# Patient Record
Sex: Female | Born: 1995
Health system: Southern US, Community
[De-identification: ages and names within clinical notes are randomized; demographics above are authoritative.]

## PROBLEM LIST (undated history)

## (undated) DIAGNOSIS — E1110 Type 2 diabetes mellitus with ketoacidosis without coma: Secondary | ICD-10-CM

## (undated) DIAGNOSIS — E109 Type 1 diabetes mellitus without complications: Secondary | ICD-10-CM

## (undated) DIAGNOSIS — N12 Tubulo-interstitial nephritis, not specified as acute or chronic: Secondary | ICD-10-CM

## (undated) HISTORY — DX: Type 2 diabetes mellitus with ketoacidosis without coma: E11.10

## (undated) HISTORY — DX: Type 1 diabetes mellitus without complications: E10.9

## (undated) HISTORY — PX: EYE MUSCLE SURGERY: SHX370

---
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
## 2015-06-12 ENCOUNTER — Encounter: Payer: Self-pay | Admitting: Emergency Medicine

## 2015-06-12 ENCOUNTER — Inpatient Hospital Stay
Admission: EM | Admit: 2015-06-12 | Discharge: 2015-06-16 | DRG: 638 | Disposition: A | Discharge status: Home / Self Care | Payer: 59 | Attending: Internal Medicine | Admitting: Internal Medicine

## 2015-06-12 ENCOUNTER — Emergency Department: Payer: 59

## 2015-06-12 DIAGNOSIS — B373 Candidiasis of vulva and vagina: Secondary | ICD-10-CM | POA: Diagnosis present

## 2015-06-12 DIAGNOSIS — E872 Acidosis: Secondary | ICD-10-CM | POA: Diagnosis present

## 2015-06-12 DIAGNOSIS — E876 Hypokalemia: Secondary | ICD-10-CM | POA: Diagnosis present

## 2015-06-12 DIAGNOSIS — B951 Streptococcus, group B, as the cause of diseases classified elsewhere: Secondary | ICD-10-CM | POA: Diagnosis present

## 2015-06-12 DIAGNOSIS — N390 Urinary tract infection, site not specified: Secondary | ICD-10-CM | POA: Diagnosis present

## 2015-06-12 DIAGNOSIS — R000 Tachycardia, unspecified: Secondary | ICD-10-CM | POA: Diagnosis present

## 2015-06-12 DIAGNOSIS — E860 Dehydration: Secondary | ICD-10-CM | POA: Diagnosis present

## 2015-06-12 DIAGNOSIS — R109 Unspecified abdominal pain: Secondary | ICD-10-CM

## 2015-06-12 DIAGNOSIS — R102 Pelvic and perineal pain: Secondary | ICD-10-CM | POA: Diagnosis present

## 2015-06-12 DIAGNOSIS — E1110 Type 2 diabetes mellitus with ketoacidosis without coma: Secondary | ICD-10-CM | POA: Diagnosis present

## 2015-06-12 DIAGNOSIS — Z794 Long term (current) use of insulin: Secondary | ICD-10-CM

## 2015-06-12 DIAGNOSIS — E1010 Type 1 diabetes mellitus with ketoacidosis without coma: Secondary | ICD-10-CM | POA: Diagnosis present

## 2015-06-12 LAB — URINALYSIS COMPLETE WITH MICROSCOPIC (ARMC ONLY)
BILIRUBIN URINE: NEGATIVE
Bacteria, UA: NONE SEEN
Hgb urine dipstick: NEGATIVE
Nitrite: NEGATIVE
Protein, ur: 100 mg/dL — AB
Specific Gravity, Urine: 1.021 (ref 1.005–1.030)
pH: 5 (ref 5.0–8.0)

## 2015-06-12 LAB — COMPREHENSIVE METABOLIC PANEL
ALBUMIN: 4.7 g/dL (ref 3.5–5.0)
ALK PHOS: 91 U/L (ref 38–126)
ALT: 12 U/L — AB (ref 14–54)
AST: 13 U/L — AB (ref 15–41)
Anion gap: 20 — ABNORMAL HIGH (ref 5–15)
BILIRUBIN TOTAL: 1 mg/dL (ref 0.3–1.2)
BUN: 11 mg/dL (ref 6–20)
CALCIUM: 9.7 mg/dL (ref 8.9–10.3)
CO2: 9 mmol/L — AB (ref 22–32)
CREATININE: 0.72 mg/dL (ref 0.44–1.00)
Chloride: 101 mmol/L (ref 101–111)
GFR calc Af Amer: >60 mL/min (ref >60)
GFR calc non Af Amer: >60 mL/min (ref >60)
GLUCOSE: 360 mg/dL — AB (ref 65–99)
Potassium: 4.6 mmol/L (ref 3.5–5.1)
SODIUM: 130 mmol/L — AB (ref 135–145)
Total Protein: 9.7 g/dL — ABNORMAL HIGH (ref 6.5–8.1)

## 2015-06-12 LAB — BASIC METABOLIC PANEL
Anion gap: 13 (ref 5–15)
Anion gap: 10 (ref 5–15)
BUN: 8 mg/dL (ref 6–20)
BUN: 7 mg/dL (ref 6–20)
CALCIUM: 8.2 mg/dL — AB (ref 8.9–10.3)
CALCIUM: 8.3 mg/dL — AB (ref 8.9–10.3)
CO2: 12 mmol/L — AB (ref 22–32)
CO2: 13 mmol/L — AB (ref 22–32)
CREATININE: 0.46 mg/dL (ref 0.44–1.00)
CREATININE: 0.47 mg/dL (ref 0.44–1.00)
Chloride: 110 mmol/L (ref 101–111)
Chloride: 109 mmol/L (ref 101–111)
GFR calc Af Amer: >60 mL/min (ref >60)
GFR calc non Af Amer: >60 mL/min (ref >60)
GFR calc non Af Amer: >60 mL/min (ref >60)
GLUCOSE: 134 mg/dL — AB (ref 65–99)
GLUCOSE: 294 mg/dL — AB (ref 65–99)
Potassium: 3.6 mmol/L (ref 3.5–5.1)
Potassium: 3.6 mmol/L (ref 3.5–5.1)
Sodium: 135 mmol/L (ref 135–145)
Sodium: 132 mmol/L — ABNORMAL LOW (ref 135–145)

## 2015-06-12 LAB — GLUCOSE, CAPILLARY
GLUCOSE-CAPILLARY: 130 mg/dL — AB (ref 65–99)
GLUCOSE-CAPILLARY: 157 mg/dL — AB (ref 65–99)
GLUCOSE-CAPILLARY: 264 mg/dL — AB (ref 65–99)
GLUCOSE-CAPILLARY: 277 mg/dL — AB (ref 65–99)
GLUCOSE-CAPILLARY: 265 mg/dL — AB (ref 65–99)
Glucose-Capillary: 389 mg/dL — ABNORMAL HIGH (ref 65–99)
Glucose-Capillary: 149 mg/dL — ABNORMAL HIGH (ref 65–99)
Glucose-Capillary: 146 mg/dL — ABNORMAL HIGH (ref 65–99)
Glucose-Capillary: 144 mg/dL — ABNORMAL HIGH (ref 65–99)
Glucose-Capillary: 131 mg/dL — ABNORMAL HIGH (ref 65–99)
Glucose-Capillary: 271 mg/dL — ABNORMAL HIGH (ref 65–99)

## 2015-06-12 LAB — CBC WITH DIFFERENTIAL/PLATELET
Basophils Absolute: 0 10*3/uL (ref 0–0.1)
EOS ABS: 0 10*3/uL (ref 0–0.7)
Eosinophils Relative: 0 %
HCT: 40.9 % (ref 35.0–47.0)
HEMOGLOBIN: 13.7 g/dL (ref 12.0–16.0)
LYMPHS ABS: 1.1 10*3/uL (ref 1.0–3.6)
Lymphocytes Relative: 7 %
MCH: 30.6 pg (ref 26.0–34.0)
MCHC: 33.4 g/dL (ref 32.0–36.0)
MCV: 91.4 fL (ref 80.0–100.0)
MONO ABS: 1.9 10*3/uL — AB (ref 0.2–0.9)
NEUTROS ABS: 12.2 10*3/uL — AB (ref 1.4–6.5)
Neutrophils Relative %: 80 %
PLATELETS: 265 10*3/uL (ref 150–440)
RBC: 4.47 MIL/uL (ref 3.80–5.20)
RDW: 12.9 % (ref 11.5–14.5)
WBC: 15.3 10*3/uL — ABNORMAL HIGH (ref 3.6–11.0)

## 2015-06-12 LAB — PREGNANCY, URINE: Preg Test, Ur: NEGATIVE

## 2015-06-12 LAB — RAPID INFLUENZA A&B ANTIGENS (ARMC ONLY)
INFLUENZA A (ARMC): NEGATIVE
INFLUENZA B (ARMC): NEGATIVE

## 2015-06-12 LAB — MRSA PCR SCREENING: MRSA by PCR: NEGATIVE

## 2015-06-12 MED ORDER — ENOXAPARIN SODIUM 40 MG/0.4ML ~~LOC~~ SOLN
40.0000 mg | SUBCUTANEOUS | Status: DC
  Administered 2015-06-12 – 2015-06-13 (×2): 40 mg via SUBCUTANEOUS
  Filled 2015-06-12 (×3): qty 0.4

## 2015-06-12 MED ORDER — PANTOPRAZOLE SODIUM 40 MG PO TBEC
40.0000 mg | DELAYED_RELEASE_TABLET | Freq: Every day | ORAL | Status: DC
  Administered 2015-06-12 – 2015-06-13 (×2): 40 mg via ORAL
  Filled 2015-06-12 (×2): qty 1

## 2015-06-12 MED ORDER — ONDANSETRON HCL 4 MG/2ML IJ SOLN
4.0000 mg | Freq: Once | INTRAMUSCULAR | Status: AC
  Administered 2015-06-12: 4 mg via INTRAVENOUS
  Filled 2015-06-12: qty 2

## 2015-06-12 MED ORDER — DEXTROSE-NACL 5-0.45 % IV SOLN
INTRAVENOUS | Status: DC
  Administered 2015-06-12 – 2015-06-13 (×3): via INTRAVENOUS

## 2015-06-12 MED ORDER — ONDANSETRON HCL 4 MG/2ML IJ SOLN: 4.0000 mg | Freq: Four times a day (QID) | INTRAMUSCULAR | Status: DC | PRN

## 2015-06-12 MED ORDER — SODIUM CHLORIDE 0.9% FLUSH
3.0000 mL | Freq: Two times a day (BID) | INTRAVENOUS | Status: DC
  Administered 2015-06-13 – 2015-06-16 (×6): 3 mL via INTRAVENOUS

## 2015-06-12 MED ORDER — MORPHINE SULFATE (PF) 2 MG/ML IV SOLN
2.0000 mg | INTRAVENOUS | Status: DC | PRN
  Administered 2015-06-12 – 2015-06-16 (×21): 2 mg via INTRAVENOUS
  Filled 2015-06-12 (×21): qty 1

## 2015-06-12 MED ORDER — ACETAMINOPHEN 650 MG RE SUPP: 650.0000 mg | Freq: Four times a day (QID) | RECTAL | Status: DC | PRN

## 2015-06-12 MED ORDER — SODIUM CHLORIDE 0.9 % IV BOLUS (SEPSIS)
1000.0000 mL | Freq: Once | INTRAVENOUS | Status: AC
  Administered 2015-06-12: 1000 mL via INTRAVENOUS

## 2015-06-12 MED ORDER — MORPHINE SULFATE (PF) 2 MG/ML IV SOLN
2.0000 mg | Freq: Once | INTRAVENOUS | Status: AC
  Administered 2015-06-12: 2 mg via INTRAVENOUS
  Filled 2015-06-12: qty 1

## 2015-06-12 MED ORDER — SODIUM CHLORIDE 0.9 % IV SOLN
INTRAVENOUS | Status: DC
  Administered 2015-06-12: 16:00:00 via INTRAVENOUS

## 2015-06-12 MED ORDER — OXYCODONE HCL 5 MG PO TABS
5.0000 mg | ORAL_TABLET | Freq: Once | ORAL | Status: AC
  Administered 2015-06-12: 5 mg via ORAL
  Filled 2015-06-12: qty 1

## 2015-06-12 MED ORDER — FLUCONAZOLE 50 MG PO TABS
150.0000 mg | ORAL_TABLET | Freq: Once | ORAL | Status: AC
  Administered 2015-06-12: 150 mg via ORAL
  Filled 2015-06-12: qty 1

## 2015-06-12 MED ORDER — DOCUSATE SODIUM 100 MG PO CAPS
100.0000 mg | ORAL_CAPSULE | Freq: Two times a day (BID) | ORAL | Status: DC
  Administered 2015-06-13 – 2015-06-16 (×6): 100 mg via ORAL
  Filled 2015-06-12 (×6): qty 1

## 2015-06-12 MED ORDER — INSULIN REGULAR HUMAN 100 UNIT/ML IJ SOLN
INTRAMUSCULAR | Status: DC
  Administered 2015-06-12: 6 [IU]/h via INTRAVENOUS
  Administered 2015-06-12: 0.9 [IU]/h via INTRAVENOUS
  Filled 2015-06-12: qty 2.5

## 2015-06-12 MED ORDER — IBUPROFEN 600 MG PO TABS
600.0000 mg | ORAL_TABLET | Freq: Once | ORAL | Status: AC
  Administered 2015-06-12: 600 mg via ORAL
  Filled 2015-06-12: qty 1

## 2015-06-12 MED ORDER — ONDANSETRON HCL 4 MG PO TABS: 4.0000 mg | ORAL_TABLET | Freq: Four times a day (QID) | ORAL | Status: DC | PRN

## 2015-06-12 MED ORDER — ACETAMINOPHEN 325 MG PO TABS
650.0000 mg | ORAL_TABLET | Freq: Four times a day (QID) | ORAL | Status: DC | PRN
  Administered 2015-06-16: 650 mg via ORAL
  Filled 2015-06-12: qty 2

## 2015-06-12 NOTE — ED Notes (Signed)
Pt presents to ED with hyperglycemia, nausea, and weakness. Pt reports she is type 1 diabetic. Pt reports she has a history of DKA.

## 2015-06-12 NOTE — H&P (Signed)
[facility] - [place] at [facility]   PATIENT NAME: Mandy Patel    MR#:  [id number]  DATE OF BIRTH:  1995/05/23  DATE OF ADMISSION:  06/12/2015  PRIMARY CARE PHYSICIAN: Romero BellingELLISON, SEAN, MD   REQUESTING/REFERRING PHYSICIAN: Dr. Shaune PollackLord  CHIEF COMPLAINT:  Nausea and feeling weak and tired  HISTORY OF PRESENT ILLNESS:  Mandy Patel  is a 20 y.o. female with a known history of nsulin requiring diabetes mellitus is presenting to the ED with a chief complaint of nausea without any vomiting and generalized weakness. Patient started having URI symptoms 3-4 days ago and is with decreased appetite. Patient is compliant with her medications including Lantus and NovoLog. Denies any abdominal pain or dizziness other complaints ABG with pH 7.19 and anion gap 20. Patient was given fluid boluses and insulin drip was initiated in the ED and hospitalist team is called to admit the patient  PAST MEDICAL HISTORY:   Past Medical History  Diagnosis Date  . Diabetes mellitus     type I, diagnosed age 20yo    PAST SURGICAL HISTOIRY:   Past Surgical History  Procedure Laterality Date  . Eye muscle surgery  approx 2010    bilat eye surgury, left exotropia worse;Dr Maple HudsonYoung, opthomology    SOCIAL HISTORY:   Social History  Substance Use Topics  . Smoking status: Never Smoker   . Smokeless tobacco: Not on file  . Alcohol Use: No    FAMILY HISTORY:   Family History  Problem Relation Age of Onset  . Hyperlipidemia Other   . Hypertension Other   . Diabetes Other   . Alcohol abuse Other   . Arthritis Other   . Hyperlipidemia Other   . Stroke Other   . Diabetes Other     DRUG ALLERGIES:  No Known Allergies  REVIEW OF SYSTEMS:  CONSTITUTIONAL: No fever, has generalized weakness  EYES: No blurred or double vision.  EARS, NOSE, AND THROAT: No tinnitus or ear pain.  RESPIRATORY: No cough, shortness of breath, wheezing or hemoptysis.  CARDIOVASCULAR: No chest pain,  orthopnea, edema.  GASTROINTESTINAL: Reporting nausea,  denies vomiting, diarrhea or abdominal pain.  GENITOURINARY: No dysuria, hematuria.  ENDOCRINE: No polyuria, nocturia,  HEMATOLOGY: No anemia, easy bruising or bleeding SKIN: No rash or lesion. MUSCULOSKELETAL: No joint pain or arthritis.   NEUROLOGIC: No tingling, numbness, weakness.  PSYCHIATRY: No anxiety or depression.   MEDICATIONS AT HOME:   Prior to Admission medications   Medication Sig Start Date End Date Taking? Authorizing Provider  insulin aspart (NOVOLOG FLEXPEN) 100 UNIT/ML injection Inject 9 Units into the skin 3 (three) times daily before meals. Patient taking differently: Inject 9 Units into the skin 3 (three) times daily before meals. Patient is dosed based on a sliding scale. 05/08/12  Yes Kermit Baloavid S Tysinger, PA-C  insulin glargine (LANTUS) 100 units/mL SOLN Inject 45 Units into the skin daily at 10 pm. Patient taking differently: Inject 30 Units into the skin at bedtime.  05/08/12  Yes Kermit Baloavid S Tysinger, PA-C      VITAL SIGNS:  Blood pressure 134/79, pulse 113, temperature 97.8 F (36.6 C), resp. rate 22, height 5\' 8"  (1.727 m), weight 73.936 kg (163 lb), last menstrual period 06/01/2015, SpO2 100 %.  PHYSICAL EXAMINATION:  GENERAL:  20 y.o.-year-old patient lying in the bed with no acute distress.  EYES: Pupils equal, round, reactive to light and accommodation. No scleral icterus. Extraocular muscles intact.  HEENT: Head atraumatic, normocephalic. Oropharynx and nasopharynx clear.  Dry mucous membranes  NECK:  Supple, no jugular venous distention. No thyroid enlargement, no tenderness.  LUNGS: Normal breath sounds bilaterally, no wheezing, rales,rhonchi or crepitation. No use of accessory muscles of respiration.  CARDIOVASCULAR: S1, S2 normal. No murmurs, rubs, or gallops.  ABDOMEN: Soft, nontender, nondistended. Bowel sounds present. No organomegaly or mass.  EXTREMITIES: No pedal edema, cyanosis, or clubbing.   NEUROLOGIC: Cranial nerves II through XII are intact. Muscle strength 5/5 in all extremities. Sensation intact. Gait not checked.  PSYCHIATRIC: The patient is alert and oriented x 3.  SKIN: No obvious rash, lesion, or ulcer.   LABORATORY PANEL:   CBC  Recent Labs Lab 06/12/15 1028  WBC 15.3*  HGB 13.7  HCT 40.9  PLT 265   ------------------------------------------------------------------------------------------------------------------  Chemistries   Recent Labs Lab 06/12/15 1028  NA 130*  K 4.6  CL 101  CO2 9*  GLUCOSE 360*  BUN 11  CREATININE 0.72  CALCIUM 9.7  AST 13*  ALT 12*  ALKPHOS 91  BILITOT 1.0   ------------------------------------------------------------------------------------------------------------------  Cardiac Enzymes No results for input(s): TROPONINI in the last 168 hours. ------------------------------------------------------------------------------------------------------------------  RADIOLOGY:  Dg Chest 2 View  06/12/2015  CLINICAL DATA:  Cough 0 1 week EXAM: CHEST  2 VIEW COMPARISON:  None. FINDINGS: The heart size and mediastinal contours are within normal limits. Both lungs are clear. The visualized skeletal structures are unremarkable. IMPRESSION: No active cardiopulmonary disease. Electronically Signed   By: Alcide Clever M.D.   On: 06/12/2015 14:18    EKG:   Orders placed or performed during the hospital encounter of 06/12/15  . EKG 12-Lead  . EKG 12-Lead    IMPRESSION AND PLAN:   Mandy Patel  is a 20 y.o. female with a known history of nsulin requiring diabetes mellitus is presenting to the ED with a chief complaint of nausea without any vomiting and generalized weakness. Patient started having URI symptoms 3-4 days ago and is with decreased appetite. Patient is compliant with her medications including Lantus and NovoLog. Denies any abdominal pain or dizziness other complaints ABG with pH 7.19 and anion gap 20.   #DKA with  history of insulin requiring diabetes mellitus pH is 7.19 and anion gap is a 20 patient is started on glucose stabilizes after fluid boluses Will provide aggressive hydration and continue glucose stabilizes her insulin drip Serial BMPs every 4 hours. Check a.m. labs including mag and phosphorus Will check hemoglobin A1c. Consult is placed to diabetic coordinator  #Pseudohyponatremia from hyperglycemia Serial BMPs and provide IV fluids aggressively. Patient is hemodynamically stable  #Vaginal discharge probably from [place] infection Diflucan 150 mg by mouth once will be given and GYN consult is placed for further examination. Patient reports single partner, no past medical history of STDs  #URI symptoms and check for flu   #Sinus tachycardia probably from dehydration from DKA Aggressive hydration with IV fluids    All the records are reviewed and case discussed with ED provider. Management plans discussed with the patient SHE is  in agreement.  CODE STATUS: fc/mom HCPOA  TOTAL CRITICAL CARE TIME TAKING CARE OF THIS PATIENT: 45 minutes.    Ramonita Lab M.D on 06/12/2015 at 2:36 PM  Between 7am to 6pm - Pager - [phone]  After 6pm go to [email or web address] - password [email or web address] [facility]  [facility]egino [facility]  Office  [phone]  CC: Primary care physician; Romero Belling, MD

## 2015-06-12 NOTE — ED Notes (Signed)
Pt states, "is that thing supposed to be hooked up?" This RN noticed that patient's IV giving her the insulin was not hooked up to patient at this time. RN hooked up patient's insulin drip, drip started for patient at 0.9 units/hour.

## 2015-06-12 NOTE — Progress Notes (Signed)
C/O vaginal pain.  States oxycodone did not help but that morphine received earlier relieved symptoms.  Spoke with E-Link MD.  Received new orders.  Will continue to monitor.

## 2015-06-12 NOTE — Progress Notes (Signed)
Paged Dr Amado CoeGouru regarding patients METB

## 2015-06-12 NOTE — Progress Notes (Signed)
Dr Amado CoeGouru notified of blood sugars being in the 130's. Insulin drip has now been on hold for 2 hours. Per MD continue to follow protocol.

## 2015-06-12 NOTE — ED Provider Notes (Signed)
[facility] Emergency Department Provider Note  ____________________________________________  Time seen:  I have reviewed the triage vital signs and the triage nursing note.  HISTORY  Chief Complaint Diabetic Ketoacidosis   Historian Patient and mother  HPI Mandy Patel is a 20 y.o. female with a history of type 1 diabetes who takes Lantus as well as a sliding scale NovoLog. She states that she started developing URI symptoms about a week ago and had decreased by mouth intake. Some nausea without vomiting. No diarrhea. No specific abdominal pain.  No fever. Symptoms are moderate and feel like prior episodes of DKA.    Past Medical History  Diagnosis Date  . Diabetes mellitus     type I, diagnosed age 48yo    Patient Active Problem List   Diagnosis Date Noted  . Type I (juvenile type) diabetes mellitus without mention of complication, uncontrolled 04/26/2011  . Preventative health care 12/01/2010  . Obesity 06/19/2010  . Goiter, unspecified 06/19/2010  . HYPERCHOLESTEROLEMIA 04/25/2008    Past Surgical History  Procedure Laterality Date  . Eye muscle surgery  approx 2010    bilat eye surgury, left exotropia worse;Dr Maple Hudson, opthomology    Current Outpatient Rx  Name  Route  Sig  Dispense  Refill  . insulin aspart (NOVOLOG FLEXPEN) 100 UNIT/ML injection   Subcutaneous   Inject 9 Units into the skin 3 (three) times daily before meals. Patient taking differently: Inject 9 Units into the skin 3 (three) times daily before meals. Patient is dosed based on a sliding scale.   30 mL   11   . insulin glargine (LANTUS) 100 units/mL SOLN   Subcutaneous   Inject 45 Units into the skin daily at 10 pm. Patient taking differently: Inject 30 Units into the skin at bedtime.    15 mL   11     Allergies Review of patient's allergies indicates no known allergies.  Family History  Problem Relation Age of Onset  . Hyperlipidemia Other   .  Hypertension Other   . Diabetes Other   . Alcohol abuse Other   . Arthritis Other   . Hyperlipidemia Other   . Stroke Other   . Diabetes Other     Social History Social History  Substance Use Topics  . Smoking status: Never Smoker   . Smokeless tobacco: None  . Alcohol Use: No    Review of Systems  Constitutional: Negative for fever. Eyes: Negative for visual changes. ENT: Negative for sore throat. Cardiovascular: Negative for chest pain. Respiratory: Negative for shortness of breath. Gastrointestinal: Negative for abdominal pain, vomiting and diarrhea. Genitourinary: Negative for dysuria. Has had some vaginal pain when she urinates. Musculoskeletal: Negative for back pain. Skin: Negative for rash. Neurological: Negative for headache. 10 point Review of Systems otherwise negative ____________________________________________   PHYSICAL EXAM:  VITAL SIGNS: ED Triage Vitals  Enc Vitals Group     BP 06/12/15 1017 134/85 mmHg     Pulse Rate 06/12/15 1017 131     Resp 06/12/15 1017 22     Temp 06/12/15 1017 97.8 F (36.6 C)     Temp src --      SpO2 06/12/15 1017 100 %     Weight 06/12/15 1017 163 lb (73.936 kg)     Height 06/12/15 1017 5\' 8"  (1.727 m)     Head Cir --      Peak Flow --      Pain Score 06/12/15 1016 9  Pain Loc --      Pain Edu? --      Excl. in GC? --      Constitutional: Alert and oriented. Well appearing and in no distress. HEENT   Head: Normocephalic and atraumatic.      Eyes: Conjunctivae are normal. PERRL. Normal extraocular movements.      Ears:         Nose: No congestion/rhinnorhea.   Mouth/Throat: Mucous membranes are Mildly dry.   Neck: No stridor. Cardiovascular/Chest: Tachycardic, regular rhythm.  No murmurs, rubs, or gallops. Respiratory: Normal respiratory effort without tachypnea nor retractions. Breath sounds are clear and equal bilaterally. No wheezes/rales/rhonchi. Gastrointestinal: Soft. No distention, no  guarding, no rebound. Nontender.    Genitourinary/rectal:Deferred Musculoskeletal: Nontender with normal range of motion in all extremities. No joint effusions.  No lower extremity tenderness.  No edema. Neurologic:  Normal speech and language. No gross or focal neurologic deficits are appreciated. Skin:  Skin is warm, dry and intact. No rash noted. Psychiatric: Mood and affect are normal. Speech and behavior are normal. Patient exhibits appropriate insight and judgment.  ____________________________________________   EKG I, Governor Rooksebecca Taylen Osorto, MD, the attending physician have personally viewed and interpreted all ECGs.  112 bpm. Sinus tachycardia. Narrow QRS normal axis. Normal ST and T-wave. ____________________________________________  LABS (pertinent positives/negatives)  Comprehensive metabolic panel significant for sodium 1:30, CO2 9, glucose 360 with anion gap 20 White blood count 15.3, hemoglobin 13.7 and platelet count 265 Venous blood gas pH 7.19, bicarbonate 10.7 Pregnancy test pending ____________________________________________  RADIOLOGY All Xrays were viewed by me. Imaging interpreted by Radiologist.  Chest x-ray pending __________________________________________  PROCEDURES  Procedure(s) performed: None  Critical Care performed: CRITICAL CARE Performed by: Governor RooksLORD, Kacen Mellinger   Total critical care time: 30 minutes  Critical care time was exclusive of separately billable procedures and treating other patients.  Critical care was necessary to treat or prevent imminent or life-threatening deterioration.  Critical care was time spent personally by me on the following activities: development of treatment plan with patient and/or surrogate as well as nursing, discussions with consultants, evaluation of patient's response to treatment, examination of patient, obtaining history from patient or surrogate, ordering and performing treatments and interventions, ordering and review  of laboratory studies, ordering and review of radiographic studies, pulse oximetry and re-evaluation of patient's condition.   ____________________________________________   ED COURSE / ASSESSMENT AND PLAN  Pertinent labs & imaging results that were available during my care of the patient were reviewed by me and considered in my medical decision making (see chart for details).   Patient with critically high fingerstick blood sugar, concerning for DKA based on her symptoms and her past history of similar.  IV fluids 2 L normal saline bolus was started.  Her laboratory studies did indicate consistent with suspected diabetic ketoacidosis. Patient was placed on the glucose stabilizer after reviewing electrolytes.  I discussed with Dr.Gouru, hospitalist for admission.  I did add on chest x-ray and urine pregnancy test, these are pending at time of hospitalist admission.    CONSULTATIONS:   Hospitalist for admission   Patient / Family / Caregiver informed of clinical course, medical decision-making process, and agree with plan.     ___________________________________________   FINAL CLINICAL IMPRESSION(S) / ED DIAGNOSES   Final diagnoses:  Diabetic ketoacidosis without coma associated with type 1 diabetes mellitus (HCC)              Note: This dictation was prepared with Dragon dictation. Any transcriptional  errors that result from this process are unintentional   Governor Rooks, MD 06/12/15 1352

## 2015-06-13 LAB — BASIC METABOLIC PANEL
Anion gap: 8 (ref 5–15)
BUN: 5 mg/dL — AB (ref 6–20)
CHLORIDE: 110 mmol/L (ref 101–111)
CO2: 16 mmol/L — AB (ref 22–32)
Calcium: 8.5 mg/dL — ABNORMAL LOW (ref 8.9–10.3)
Creatinine, Ser: 0.51 mg/dL (ref 0.44–1.00)
GFR calc Af Amer: >60 mL/min (ref >60)
GFR calc non Af Amer: >60 mL/min (ref >60)
Glucose, Bld: 265 mg/dL — ABNORMAL HIGH (ref 65–99)
POTASSIUM: 3.5 mmol/L (ref 3.5–5.1)
SODIUM: 134 mmol/L — AB (ref 135–145)

## 2015-06-13 LAB — URINE CULTURE

## 2015-06-13 LAB — CBC
HEMATOCRIT: 33.8 % — AB (ref 35.0–47.0)
HEMOGLOBIN: 11.5 g/dL — AB (ref 12.0–16.0)
MCH: 31 pg (ref 26.0–34.0)
MCHC: 33.9 g/dL (ref 32.0–36.0)
MCV: 91.3 fL (ref 80.0–100.0)
Platelets: 224 10*3/uL (ref 150–440)
RBC: 3.71 MIL/uL — AB (ref 3.80–5.20)
RDW: 12.7 % (ref 11.5–14.5)
WBC: 11.6 10*3/uL — ABNORMAL HIGH (ref 3.6–11.0)

## 2015-06-13 LAB — GLUCOSE, CAPILLARY
GLUCOSE-CAPILLARY: 232 mg/dL — AB (ref 65–99)
GLUCOSE-CAPILLARY: 124 mg/dL — AB (ref 65–99)
GLUCOSE-CAPILLARY: 132 mg/dL — AB (ref 65–99)
GLUCOSE-CAPILLARY: 144 mg/dL — AB (ref 65–99)
GLUCOSE-CAPILLARY: 166 mg/dL — AB (ref 65–99)
GLUCOSE-CAPILLARY: 149 mg/dL — AB (ref 65–99)
GLUCOSE-CAPILLARY: 214 mg/dL — AB (ref 65–99)
GLUCOSE-CAPILLARY: 334 mg/dL — AB (ref 65–99)
Glucose-Capillary: 151 mg/dL — ABNORMAL HIGH (ref 65–99)
Glucose-Capillary: 155 mg/dL — ABNORMAL HIGH (ref 65–99)
Glucose-Capillary: 172 mg/dL — ABNORMAL HIGH (ref 65–99)
Glucose-Capillary: 178 mg/dL — ABNORMAL HIGH (ref 65–99)
Glucose-Capillary: 199 mg/dL — ABNORMAL HIGH (ref 65–99)
Glucose-Capillary: 222 mg/dL — ABNORMAL HIGH (ref 65–99)
Glucose-Capillary: 237 mg/dL — ABNORMAL HIGH (ref 65–99)

## 2015-06-13 LAB — COMPREHENSIVE METABOLIC PANEL
ALK PHOS: 58 U/L (ref 38–126)
ALT: 9 U/L — ABNORMAL LOW (ref 14–54)
ANION GAP: 8 (ref 5–15)
AST: 10 U/L — ABNORMAL LOW (ref 15–41)
Albumin: 3.4 g/dL — ABNORMAL LOW (ref 3.5–5.0)
BILIRUBIN TOTAL: 0.4 mg/dL (ref 0.3–1.2)
BUN: <5 mg/dL — ABNORMAL LOW (ref 6–20)
CALCIUM: 8.4 mg/dL — AB (ref 8.9–10.3)
CO2: 16 mmol/L — AB (ref 22–32)
Chloride: 111 mmol/L (ref 101–111)
Creatinine, Ser: 0.5 mg/dL (ref 0.44–1.00)
GFR calc non Af Amer: >60 mL/min (ref >60)
Glucose, Bld: 189 mg/dL — ABNORMAL HIGH (ref 65–99)
POTASSIUM: 3.4 mmol/L — AB (ref 3.5–5.1)
SODIUM: 135 mmol/L (ref 135–145)
TOTAL PROTEIN: 7 g/dL (ref 6.5–8.1)

## 2015-06-13 LAB — MAGNESIUM: MAGNESIUM: 1.7 mg/dL (ref 1.7–2.4)

## 2015-06-13 LAB — CHLAMYDIA/NGC RT PCR (ARMC ONLY)
CHLAMYDIA TR: NOT DETECTED
N GONORRHOEAE: NOT DETECTED

## 2015-06-13 LAB — PHOSPHORUS: Phosphorus: 1.7 mg/dL — ABNORMAL LOW (ref 2.5–4.6)

## 2015-06-13 LAB — HEMOGLOBIN A1C: Hgb A1c MFr Bld: 12.6 % — ABNORMAL HIGH (ref 4.0–6.0)

## 2015-06-13 MED ORDER — INSULIN GLARGINE 100 UNIT/ML ~~LOC~~ SOLN
45.0000 [IU] | Freq: Once | SUBCUTANEOUS | Status: AC
  Administered 2015-06-13: 45 [IU] via SUBCUTANEOUS
  Filled 2015-06-13 (×2): qty 0.45

## 2015-06-13 MED ORDER — DOXYCYCLINE HYCLATE 100 MG IV SOLR
100.0000 mg | Freq: Two times a day (BID) | INTRAVENOUS | Status: DC
  Administered 2015-06-13: 100 mg via INTRAVENOUS
  Filled 2015-06-13 (×2): qty 100

## 2015-06-13 MED ORDER — FLUCONAZOLE IN SODIUM CHLORIDE 100-0.9 MG/50ML-% IV SOLN
100.0000 mg | INTRAVENOUS | Status: DC
  Administered 2015-06-14 – 2015-06-16 (×3): 100 mg via INTRAVENOUS
  Filled 2015-06-13 (×5): qty 50

## 2015-06-13 MED ORDER — INSULIN ASPART 100 UNIT/ML ~~LOC~~ SOLN
0.0000 [IU] | Freq: Three times a day (TID) | SUBCUTANEOUS | Status: DC
  Administered 2015-06-13: 16:00:00 3 [IU] via SUBCUTANEOUS
  Administered 2015-06-13: 1 [IU] via SUBCUTANEOUS
  Filled 2015-06-13: qty 3
  Filled 2015-06-13: qty 1

## 2015-06-13 MED ORDER — FLUCONAZOLE 100 MG PO TABS
150.0000 mg | ORAL_TABLET | Freq: Every day | ORAL | Status: DC
  Administered 2015-06-13: 150 mg via ORAL
  Filled 2015-06-13: qty 1

## 2015-06-13 MED ORDER — K PHOS MONO-SOD PHOS DI & MONO 155-852-130 MG PO TABS
500.0000 mg | ORAL_TABLET | ORAL | Status: AC
  Administered 2015-06-13 – 2015-06-14 (×4): 500 mg via ORAL
  Filled 2015-06-13 (×4): qty 2

## 2015-06-13 MED ORDER — POTASSIUM CHLORIDE CRYS ER 20 MEQ PO TBCR
40.0000 meq | EXTENDED_RELEASE_TABLET | Freq: Once | ORAL | Status: AC
  Administered 2015-06-13: 40 meq via ORAL
  Filled 2015-06-13: qty 2

## 2015-06-13 MED ORDER — SODIUM CHLORIDE 0.9 % IV SOLN
3.0000 g | Freq: Four times a day (QID) | INTRAVENOUS | Status: DC
  Administered 2015-06-13: 3 g via INTRAVENOUS
  Filled 2015-06-13 (×4): qty 3

## 2015-06-13 MED ORDER — INSULIN ASPART 100 UNIT/ML ~~LOC~~ SOLN
0.0000 [IU] | Freq: Three times a day (TID) | SUBCUTANEOUS | Status: DC
  Administered 2015-06-13: 7 [IU] via SUBCUTANEOUS
  Administered 2015-06-14 – 2015-06-15 (×5): 2 [IU] via SUBCUTANEOUS
  Administered 2015-06-15 (×2): 3 [IU] via SUBCUTANEOUS
  Administered 2015-06-15: 21:00:00 5 [IU] via SUBCUTANEOUS
  Administered 2015-06-16: 17:00:00 3 [IU] via SUBCUTANEOUS
  Administered 2015-06-16: 13:00:00 5 [IU] via SUBCUTANEOUS
  Administered 2015-06-16: 2 [IU] via SUBCUTANEOUS
  Filled 2015-06-13 (×2): qty 3
  Filled 2015-06-13 (×3): qty 2
  Filled 2015-06-13: qty 7
  Filled 2015-06-13 (×2): qty 5
  Filled 2015-06-13: qty 3
  Filled 2015-06-13 (×3): qty 2

## 2015-06-13 MED ORDER — SODIUM CHLORIDE 0.9 % IV SOLN
INTRAVENOUS | Status: AC
  Administered 2015-06-13 – 2015-06-14 (×3): via INTRAVENOUS

## 2015-06-13 NOTE — Consult Note (Signed)
Obstetrics & Gynecology Consultation Note  Date of Consultation: 06/13/2015   Requesting Provider: Dr Amado CoeGouru  Primary OBGYN: None  Reason for Consultation: Vag discharge and pain  History of Present Illness: [name] is a 20 y.o.  (Patient's last menstrual period was 06/01/2015.), with the above CC. Pt presents with high blood sugars and DKA.  She reports she has had a vag d/c for 2 days.  No recent infection, has had yeast infections in past.  No h/o BV, STD.  Recent sex activity last week, reports no problems there.  One partner.  Now, pt reports d/c, odor and pain.  In fact, vag pain is severe requiring narcotics.  ROS: A 12-point review of systems was performed and negative, except as stated in the above HPI.  OBGYN History: As per HPI. OB History    No data available     no history of abnormal pap smears (last pap smear: not done, too young) no history of STIs.   She is currently using NEXPLANON for contraception.    Past Medical History: Past Medical History  Diagnosis Date  . Diabetes mellitus     type I, diagnosed age 20yo    Past Surgical History: Past Surgical History  Procedure Laterality Date  . Eye muscle surgery  approx 2010    bilat eye surgury, left exotropia worse;Dr Maple HudsonYoung, opthomology    Family History:  Family History  Problem Relation Age of Onset  . Hyperlipidemia Other   . Hypertension Other   . Diabetes Other   . Alcohol abuse Other   . Arthritis Other   . Hyperlipidemia Other   . Stroke Other   . Diabetes Other    She denies any female cancers, bleeding or blood clotting disorders.   Social History:  Social History   Social History  . Marital Status: Single    Spouse Name: N/A  . Number of Children: N/A  . Years of Education: N/A   Occupational History  . student    Social History Main Topics  . Smoking status: Never Smoker   . Smokeless tobacco: Not on file  . Alcohol Use: No  . Drug Use: No  . Sexual Activity: Not on file    Other Topics Concern  . Not on file   Social History Narrative    Health Maintenance:  Mammogram no;  Colonoscopy no,  Flu shot no / unknown  Allergy: No Known Allergies  Current Outpatient Medications: Prescriptions prior to admission  Medication Sig Dispense Refill Last Dose  . insulin aspart (NOVOLOG FLEXPEN) 100 UNIT/ML injection Inject 9 Units into the skin 3 (three) times daily before meals. (Patient taking differently: Inject 9 Units into the skin 3 (three) times daily before meals. Patient is dosed based on a sliding scale.) 30 mL 11 06/12/2015 at 0930  . insulin glargine (LANTUS) 100 units/mL SOLN Inject 45 Units into the skin daily at 10 pm. (Patient taking differently: Inject 30 Units into the skin at bedtime. ) 15 mL 11 06/11/2015 at [id number]     Hospital Medications: Current Facility-Administered Medications  Medication Dose Route Frequency Provider Last Rate Last Dose  . acetaminophen (TYLENOL) tablet 650 mg  650 mg Oral Q6H PRN Ramonita LabAruna Gouru, MD       Or  . acetaminophen (TYLENOL) suppository 650 mg  650 mg Rectal Q6H PRN Ramonita LabAruna Gouru, MD      . Ampicillin-Sulbactam (UNASYN) 3 g in sodium chloride 0.9 % 100 mL IVPB  3 g Intravenous Q6H  Auburn Bilberry, MD   3 g at 06/13/15 1013  . dextrose 5 %-0.45 % sodium chloride infusion   Intravenous Continuous Ramonita Lab, MD 150 mL/hr at 06/13/15 0640    . docusate sodium (COLACE) capsule 100 mg  100 mg Oral BID Ramonita Lab, MD   100 mg at 06/13/15 1006  . doxycycline (VIBRAMYCIN) 100 mg in dextrose 5 % 250 mL IVPB  100 mg Intravenous Q12H Auburn Bilberry, MD   100 mg at 06/13/15 1013  . enoxaparin (LOVENOX) injection 40 mg  40 mg Subcutaneous Q24H Ramonita Lab, MD   40 mg at 06/12/15 1926  . insulin aspart (novoLOG) injection 0-9 Units  0-9 Units Subcutaneous TID WC Auburn Bilberry, MD      . insulin regular (NOVOLIN R,HUMULIN R) 250 Units in sodium chloride 0.9 % 250 mL (1 Units/mL) infusion   Intravenous Continuous Governor Rooks, MD  9.4 mL/hr at 06/13/15 1025 9.4 Units/hr at 06/13/15 1025  . morphine 2 MG/ML injection 2 mg  2 mg Intravenous Q3H PRN Coralyn Helling, MD   2 mg at 06/13/15 0908  . ondansetron (ZOFRAN) tablet 4 mg  4 mg Oral Q6H PRN Ramonita Lab, MD       Or  . ondansetron (ZOFRAN) injection 4 mg  4 mg Intravenous Q6H PRN Aruna Gouru, MD      . pantoprazole (PROTONIX) EC tablet 40 mg  40 mg Oral Daily Ramonita Lab, MD   40 mg at 06/13/15 0908  . sodium chloride flush (NS) 0.9 % injection 3 mL  3 mL Intravenous Q12H Ramonita Lab, MD   3 mL at 06/12/15 2151     Physical Exam: Filed Vitals:   06/13/15 0600 06/13/15 0700 06/13/15 0800 06/13/15 0900  BP: 119/71 128/71 119/62 119/72  Pulse: 110 113 110 120  Temp:      Resp: 23 22 24 20   Height:      Weight:      SpO2: 100% 100% 100% 100%    Temp:  [97.9 F (36.6 C)-98.3 F (36.8 C)] 98.3 F (36.8 C) (05/11 1945) Pulse Rate:  [105-121] 120 (05/12 0900) Resp:  [14-24] 20 (05/12 0900) BP: (95-134)/(61-81) 119/72 mmHg (05/12 0900) SpO2:  [99 %-100 %] 100 % (05/12 0900) I/O last 3 completed shifts: In: -  Out: 16109 [Urine:13000]    Intake/Output Summary (Last 24 hours) at 06/13/15 1145 Last data filed at 06/13/15 0214  Gross per 24 hour  Intake      0 ml  Output  60454 ml  Net -13000 ml     Current Vital Signs 24h Vital Sign Ranges  T 98.3 F (36.8 C) Temp  Avg: 98.1 F (36.7 C)  Min: 97.9 F (36.6 C)  Max: 98.3 F (36.8 C)  BP 119/72 mmHg BP  Min: 95/62  Max: 134/79  HR (!) 120 Pulse  Avg: 113.2  Min: 105  Max: 121  RR 20 Resp  Avg: 19.5  Min: 14  Max: 24  SaO2 100 % Not Delivered SpO2  Avg: 99.9 %  Min: 99 %  Max: 100 %       24 Hour I/O Current Shift I/O  Time Ins Outs 05/11 0701 - 05/12 0700 In: -  Out: 09811 [Urine:13000]     Patient Vitals for the past 8 hrs:  BP Pulse Resp SpO2  06/13/15 0900 119/72 mmHg (!) 120 20 100 %  06/13/15 0800 119/62 mmHg (!) 110 (!) 24 100 %  06/13/15 0700 128/71 mmHg Marland Kitchen)  113 (!) 22 100 %  06/13/15  0600 119/71 mmHg (!) 110 (!) 23 100 %  06/13/15 0500 122/73 mmHg (!) 112 (!) 23 100 %  06/13/15 0400 133/75 mmHg (!) 119 (!) 22 100 %    Body mass index is 24.79 kg/(m^2). General appearance: Well nourished, well developed female in no acute distress.  Neck:  Supple, normal appearance, and no thyromegaly  Cardiovascular:Regular rate and rhythm.  No murmurs, rubs or gallops. Respiratory:  Clear to auscultation bilateral. Normal respiratory effort Abdomen: positive bowel sounds and no masses, hernias; diffusely non tender to palpation, non distended Neuro/Psych:  Normal mood and affect.  Skin:  Warm and dry.  Lymphatic:  No inguinal lymphadenopathy.   Pelvic exam: is not limited by body habitus EGBUS and VAGINA: external excoriations, discharge and pain to touch,  Cervix:  normal, Uterus:  Normal size and Adnexa:  No mass   Recent Labs Lab 06/12/15 1028 06/13/15 0537  WBC 15.3* 11.6*  HGB 13.7 11.5*  HCT 40.9 33.8*  PLT 265 224    Recent Labs Lab 06/12/15 1028  06/12/15 2107 06/13/15 0115 06/13/15 0537  NA 130*  < > 132* 134* 135  K 4.6  < > 3.6 3.5 3.4*  CL 101  < > 109 110 111  CO2 9*  < > 13* 16* 16*  BUN 11  < > 7 5* <5*  CREATININE 0.72  < > 0.47 0.51 0.50  CALCIUM 9.7  < > 8.3* 8.5* 8.4*  PROT 9.7*  --   --   --  7.0  BILITOT 1.0  --   --   --  0.4  ALKPHOS 91  --   --   --  58  ALT 12*  --   --   --  9*  AST 13*  --   --   --  10*  GLUCOSE 360*  < > 294* 265* 189*  < > = values in this interval not displayed.   Assessment: Ms. Minassian is a 20 y.o. who presents with complaints of  Painful vagina and discharge; findings are consistent with severe vaginal candidiasis, worsened by diabetes/DKA.  Plan: Diflucan daily 7 days. Culture for other etiology, serology for HSV (due to pain). Cont Diflucan empirically until culture results return. Supportive care for pain.  Annamarie Major, MD [facility] Pager [phone]

## 2015-06-13 NOTE — Progress Notes (Addendum)
  RD consulted for nutrition education regarding diabetes.   Lab Results  Component Value Date   HGBA1C 13.5* 12/01/2010     Pt reports has written down at home how much insulin she should take compared to how many carbs she eats.  Asked what happens when she is not at home. Reports her mom carries it for her.  Then asked what does she do when her mom is not around.  Reports she keeps it in her car.  Suggested she add this in her cell phone as she does carry this around all the time.  Had trouble telling me how much carbs are in certain foods (sliced bread, etc)  RD provided "Carbohydrate Counting for People with Diabetes" handout from the Academy of [employer] and Dietetics. Discussed different food groups and their effects on blood sugar, emphasizing carbohydrate-containing foods. Provided list of carbohydrates and recommended serving sizes of common foods.  Discussed importance of controlled and consistent carbohydrate intake throughout the day. Provided examples of ways to balance meals/snacks and encouraged intake of high-fiber, whole grain complex carbohydrates. Reviewed hospital menu with pt and amounts of carbs.  Teach back method used.  Expect fair compliance.  Body mass index is 24.78 kg/(m^2).   Current diet order is carb modified, patient is consuming approximately 50% of meals at this time. Labs and medications reviewed. Recommend outpatient RD diet education with pt and mother. Discussed with Raynelle FanningJulie, RN. No further nutrition interventions warranted at this time. RD contact information provided. If additional nutrition issues arise, please re-consult RD.    Mandy Patel, RD, LDN [phone] (pager) Weekend/On-Call pager [phone])

## 2015-06-13 NOTE — Progress Notes (Signed)
[facility] -  at [facility]                                                                                                                                                                                            Patient Demographics   Mandy Patel, is a 20 y.o. female, DOB - Jan 15, 1996, JXB:[id number]  Admit date - 06/12/2015   Admitting Physician Ramonita Lab, MD  Outpatient Primary MD for the patient is No primary care provider on file.   LOS - 1  Subjective:Continues to complain of vaginal discharge and Pain in her pelvic region. Nausea vomiting resolved   Review of Systems:   CONSTITUTIONAL: No documented fever. No fatigue, Positive weakness. No weight gain, no weight loss.  EYES: No blurry or double vision.  ENT: No tinnitus. No postnasal drip. No redness of the oropharynx.  RESPIRATORY: No cough, no wheeze, no hemoptysis. No dyspnea.  CARDIOVASCULAR: No chest pain. No orthopnea. No palpitations. No syncope.  GASTROINTESTINAL: No nausea, no vomiting or diarrhea. No abdominal pain. No melena or hematochezia.  GENITOURINARY: Patient has vaginal discharge significant pelvic pain ENDOCRINE: No polyuria or nocturia. No heat or cold intolerance.  HEMATOLOGY: No anemia. No bruising. No bleeding.  INTEGUMENTARY: No rashes. No lesions.  MUSCULOSKELETAL: No arthritis. No swelling. No gout.  NEUROLOGIC: No numbness, tingling, or ataxia. No seizure-type activity.  PSYCHIATRIC: No anxiety. No insomnia. No ADD.    Vitals:   Filed Vitals:   06/13/15 0600 06/13/15 0700 06/13/15 0800 06/13/15 0900  BP: 119/71 128/71 119/62 119/72  Pulse: 110 113 110 120  Temp:      Resp: 23 22 24 20   Height:      Weight:      SpO2: 100% 100% 100% 100%    Wt Readings from Last 3 Encounters:  06/12/15 73.936 kg (163 lb) (89 %*, Z = 1.23)  05/08/12 77.111 kg (170 lb) (94 %*, Z = 1.58)  04/26/11 83.462 kg (184 lb) (97 %*, Z = 1.90)   * Growth percentiles are based  on CDC 2-20 Years data.     Intake/Output Summary (Last 24 hours) at 06/13/15 1300 Last data filed at 06/13/15 0214  Gross per 24 hour  Intake      0 ml  Output  13086 ml  Net -13000 ml    Physical Exam:   GENERAL: Pleasant-appearing in no apparent distress.  HEAD, EYES, EARS, NOSE AND THROAT: Atraumatic, normocephalic. Extraocular muscles are intact. Pupils equal and reactive to light. Sclerae anicteric. No conjunctival injection. No oro-pharyngeal erythema.  NECK: Supple. There is no jugular venous distention. No bruits,  no lymphadenopathy, no thyromegaly.  HEART: Regular rate and rhythm,. No murmurs, no rubs, no clicks.  LUNGS: Clear to auscultation bilaterally. No rales or rhonchi. No wheezes.  ABDOMEN: Soft, flat, nontender, nondistended. Has good bowel sounds. No hepatosplenomegaly appreciated. Lower abdomen tenderness EXTREMITIES: No evidence of any cyanosis, clubbing, or peripheral edema.  +2 pedal and radial pulses bilaterally.  NEUROLOGIC: The patient is alert, awake, and oriented x3 with no focal motor or sensory deficits appreciated bilaterally.  SKIN: Moist and warm with no rashes appreciated.  Psych: Not anxious, depressed LN: No inguinal LN enlargement    Antibiotics   Anti-infectives    Start     Dose/Rate Route Frequency Ordered Stop   06/13/15 1200  fluconazole (DIFLUCAN) tablet 150 mg     150 mg Oral Daily 06/13/15 1156 06/20/15 0959   06/13/15 1000  Ampicillin-Sulbactam (UNASYN) 3 g in sodium chloride 0.9 % 100 mL IVPB     3 g 100 mL/hr over 60 Minutes Intravenous Every 6 hours 06/13/15 0913     06/13/15 1000  doxycycline (VIBRAMYCIN) 100 mg in dextrose 5 % 250 mL IVPB     100 mg 125 mL/hr over 120 Minutes Intravenous Every 12 hours 06/13/15 0913     06/12/15 1445  fluconazole (DIFLUCAN) tablet 150 mg     150 mg Oral  Once 06/12/15 1435 06/12/15 1601      Medications   Scheduled Meds: . ampicillin-sulbactam (UNASYN) IV  3 g Intravenous Q6H  .  docusate sodium  100 mg Oral BID  . doxycycline (VIBRAMYCIN) IV  100 mg Intravenous Q12H  . enoxaparin (LOVENOX) injection  40 mg Subcutaneous Q24H  . fluconazole  150 mg Oral Daily  . insulin aspart  0-9 Units Subcutaneous TID WC  . pantoprazole  40 mg Oral Daily  . sodium chloride flush  3 mL Intravenous Q12H   Continuous Infusions: . sodium chloride    . insulin (NOVOLIN-R) infusion 9.4 Units/hr (06/13/15 1025)   PRN Meds:.acetaminophen **OR** acetaminophen, morphine injection, ondansetron **OR** ondansetron (ZOFRAN) IV   Data Review:   Micro Results Recent Results (from the past 240 hour(s))  Rapid Influenza A&B Antigens ([facility] only)     Status: None   Collection Time: 06/12/15  4:14 PM  Result Value Ref Range Status   Influenza A ([facility]) NEGATIVE NEGATIVE Final   Influenza B ([facility]) NEGATIVE NEGATIVE Final  MRSA PCR Screening     Status: None   Collection Time: 06/12/15  4:54 PM  Result Value Ref Range Status   MRSA by PCR NEGATIVE NEGATIVE Final    Comment:        The GeneXpert MRSA Assay (FDA approved for NASAL specimens only), is one component of a comprehensive MRSA colonization surveillance program. It is not intended to diagnose MRSA infection nor to guide or monitor treatment for MRSA infections.   Urine culture     Status: None (Preliminary result)   Collection Time: 06/12/15  4:54 PM  Result Value Ref Range Status   Specimen Description URINE, RANDOM  Final   Special Requests NONE  Final   Culture CULTURE REINCUBATED FOR BETTER GROWTH  Final   Report Status PENDING  Incomplete    Radiology Reports Dg Chest 2 View  06/12/2015  CLINICAL DATA:  Cough 0 1 week EXAM: CHEST  2 VIEW COMPARISON:  None. FINDINGS: The heart size and mediastinal contours are within normal limits. Both lungs are clear. The visualized skeletal structures are unremarkable. IMPRESSION: No active cardiopulmonary disease.  Electronically Signed   By: Alcide CleverMark  Lukens M.D.   On: 06/12/2015 14:18      CBC  Recent Labs Lab 06/12/15 1028 06/13/15 0537  WBC 15.3* 11.6*  HGB 13.7 11.5*  HCT 40.9 33.8*  PLT 265 224  MCV 91.4 91.3  MCH 30.6 31.0  MCHC 33.4 33.9  RDW 12.9 12.7  LYMPHSABS 1.1  --   MONOABS 1.9*  --   EOSABS 0.0  --   BASOSABS 0.0  --     Chemistries   Recent Labs Lab 06/12/15 1028 06/12/15 1711 06/12/15 2107 06/13/15 0115 06/13/15 0537  NA 130* 135 132* 134* 135  K 4.6 3.6 3.6 3.5 3.4*  CL 101 110 109 110 111  CO2 9* 12* 13* 16* 16*  GLUCOSE 360* 134* 294* 265* 189*  BUN 11 8 7  5* <5*  CREATININE 0.72 0.46 0.47 0.51 0.50  CALCIUM 9.7 8.2* 8.3* 8.5* 8.4*  MG  --   --   --   --  1.7  AST 13*  --   --   --  10*  ALT 12*  --   --   --  9*  ALKPHOS 91  --   --   --  58  BILITOT 1.0  --   --   --  0.4   ------------------------------------------------------------------------------------------------------------------ estimated creatinine clearance is 114.1 mL/min (by C-G formula based on Cr of 0.5). ------------------------------------------------------------------------------------------------------------------ No results for input(s): HGBA1C in the last 72 hours. ------------------------------------------------------------------------------------------------------------------ No results for input(s): CHOL, HDL, LDLCALC, TRIG, CHOLHDL, LDLDIRECT in the last 72 hours. ------------------------------------------------------------------------------------------------------------------ No results for input(s): TSH, T4TOTAL, T3FREE, THYROIDAB in the last 72 hours.  Invalid input(s): FREET3 ------------------------------------------------------------------------------------------------------------------ No results for input(s): VITAMINB12, FOLATE, FERRITIN, TIBC, IRON, RETICCTPCT in the last 72 hours.  Coagulation profile No results for input(s): INR, PROTIME in the last 168 hours.  No results for input(s): DDIMER in the last 72 hours.  Cardiac  Enzymes No results for input(s): CKMB, TROPONINI, MYOGLOBIN in the last 168 hours.  Invalid input(s): CK ------------------------------------------------------------------------------------------------------------------ Invalid input(s): POCBNP    Assessment & Plan   Mandy Patel is a 20 y.o. female with a known history of nsulin requiring diabetes mellitus is presenting to the ED complaining of nausea pelvic pain vaginal discharge  #DKA with history of insulin requiring diabetes mellitus DKA is resolved I will stop insulin drip started her back on her home insulin Diabetic coordinator consult  #Sinus tachycardia suspect due to pain Continue to monitor on telemetry  #Pelvic pain with vaginal discharge GYN evaluation pending. I will start patient on therapy for possible PID with Unasyn and doxycycline Check HIV I will also obtain a CT of the abdomen pelvis due to persistent symptoms  #Pseudohyponatremia from hyperglycemia Resolved with bg improved        Code Status Orders        Start     Ordered   06/12/15 1708  Full code   Continuous     06/12/15 1707    Code Status History    Date Active Date Inactive Code Status Order ID Comments User Context   This patient has a current code status but no historical code status.           Consults  GYN   DVT Prophylaxis  Lovenox  Lab Results  Component Value Date   PLT 224 06/13/2015     Time Spent in minutes   35 minutes Greater than 50% of time spent in care coordination and  counseling patient regarding the condition and plan of care.   Auburn Bilberry M.D on 06/13/2015 at 1:00 PM  Between 7am to 6pm - Pager - [phone]  After 6pm go to [email or web address] - password [email or web address] [facility]  [facility]   Office  [phone]

## 2015-06-13 NOTE — Progress Notes (Signed)
Patient alert and oriented. Off of insulin drip and transitioned to Lantus and novolog. Complaints of vaginal pain. Patient has been apprehensive/conservative of a visual assessment. OB GYN consult- Once arrived MD unable to complete an exam due to pain intensity. Culture obtained, was able to urinate and clean patient. Ice packs applied due to swelling. Please review assessment flow sheet.

## 2015-06-13 NOTE — Progress Notes (Signed)
Inpatient Diabetes Program Recommendations  AACE/ADA: New Consensus Statement on Inpatient Glycemic Control (2015)  Target Ranges:  Prepandial:   less than 140 mg/dL      Peak postprandial:   less than 180 mg/dL (1-2 hours)      Critically ill patients:  140 - 180 mg/dL   Review of Glycemic Control  Results for Lawerance CruelLLEN, Mandy Patel (MRN [id number]) as of 06/13/2015 07:29  Ref. Range 06/13/2015 02:26 06/13/2015 03:28 06/13/2015 04:30 06/13/2015 05:32 06/13/2015 06:33  Glucose-Capillary Latest Ref Range: 65-99 mg/dL 409178 (H) 811124 (H) 914132 (H) 144 (H) 151 (H)    Diabetes history: Type 1 since age 20 Outpatient Diabetes medications: Lantus 30 units qhs, Novolog 9 units tid ordered (medication req. States patient uses Patel sliding scale)  Current orders for Inpatient glycemic control: IV insulin @ 3.6unit/hour  Inpatient Diabetes Program Recommendations:  MD will order/ initiate Phase 2 of DKA Protocol for transition off insulin drip when DKA is resolved as evidenced by Venous CO2=20, normal anion gap (8-12), negative ketones. For the transition to SQ insulin,  RN will discontinue insulin drip 2 hours after subcutaneous basal insulin given and simultaneously give Novolog correction scale.  Susette RacerJulie Bertrand Vowels, RN, BA, MHA, CDE Diabetes Coordinator Inpatient Diabetes Program  [phone] (Team Pager) [phone] [facility] Office) 06/13/2015 7:38 AM

## 2015-06-13 NOTE — Progress Notes (Addendum)
Inpatient Diabetes Program Recommendations  AACE/ADA: New Consensus Statement on Inpatient Glycemic Control (2015)  Target Ranges:  Prepandial:   less than 140 mg/dL      Peak postprandial:   less than 180 mg/dL (1-2 hours)      Critically ill patients:  140 - 180 mg/dL     Met with patient (diabetes coordinator consult received)- she reports that she has had diabetes since age 20- when I said that the notes report she was age 21, she confirmed it was age 66.   Patient tells me she sees Dr. Cathlean Cower, [place] in [place] but I am not able to see a recent visit to that office.   I asked her to clarify her doses of insulin- she confirms that the Lantus dose is 30 units as written in the medication req.not the 45 units ordered.  She tells me she takes Novolog insulin according to a sliding scale and she does not know what that scale is- she was not able to tell me what her insulin to carb ratio is either, telling me it is written down at home.   It makes me wonder if she is doing the best to manage her  diabetes because based on my conversation it sounds like she is not counting carbs- it will be interesting to see what her A1C is.    I would recommend she follow up with Dr. Atha Starks or Dr. Gabriel Carina if she has not seen Dr. Jeneen Rinks in the recent past.   Consult to dietitian placed for review of carb counting.   Gentry Fitz, RN, BA, MHA, CDE Diabetes Coordinator Inpatient Diabetes Program  [phone] (Team Pager) [phone] ([place]) 06/13/2015 12:18 PM

## 2015-06-14 LAB — GLUCOSE, CAPILLARY
GLUCOSE-CAPILLARY: 191 mg/dL — AB (ref 65–99)
GLUCOSE-CAPILLARY: 200 mg/dL — AB (ref 65–99)
Glucose-Capillary: 155 mg/dL — ABNORMAL HIGH (ref 65–99)
Glucose-Capillary: 182 mg/dL — ABNORMAL HIGH (ref 65–99)

## 2015-06-14 LAB — PHOSPHORUS: PHOSPHORUS: 3.2 mg/dL (ref 2.5–4.6)

## 2015-06-14 LAB — RPR: RPR: NONREACTIVE

## 2015-06-14 LAB — BASIC METABOLIC PANEL
ANION GAP: 6 (ref 5–15)
BUN: <5 mg/dL — ABNORMAL LOW (ref 6–20)
CO2: 23 mmol/L (ref 22–32)
Calcium: 8.3 mg/dL — ABNORMAL LOW (ref 8.9–10.3)
Chloride: 110 mmol/L (ref 101–111)
Creatinine, Ser: 0.34 mg/dL — ABNORMAL LOW (ref 0.44–1.00)
GFR calc Af Amer: >60 mL/min (ref >60)
GLUCOSE: 198 mg/dL — AB (ref 65–99)
POTASSIUM: 3.3 mmol/L — AB (ref 3.5–5.1)
Sodium: 139 mmol/L (ref 135–145)

## 2015-06-14 LAB — HIV ANTIBODY (ROUTINE TESTING W REFLEX): HIV Screen 4th Generation wRfx: NONREACTIVE

## 2015-06-14 MED ORDER — INSULIN GLARGINE 100 UNIT/ML ~~LOC~~ SOLN
30.0000 [IU] | Freq: Every day | SUBCUTANEOUS | Status: DC
  Administered 2015-06-14 – 2015-06-16 (×3): 30 [IU] via SUBCUTANEOUS
  Filled 2015-06-14 (×4): qty 0.3

## 2015-06-14 NOTE — Progress Notes (Signed)
[facility] - [place] at [facility]                                                                                                                                                                                            Patient Demographics   Mandy Patel, is a 20 y.o. female, DOB - 1995-06-24, FAO:[id number]  Admit date - 06/12/2015   Admitting Physician Ramonita Lab, MD  Outpatient Primary MD for the patient is No primary care provider on file.   LOS - 2  Subjective: she  is feeling better. Nausea, vomiting improved. Vaginal discharge also is improving.  Review of Systems:   CONSTITUTIONAL: No documented fever. No fatigue, Positive weakness. No weight gain, no weight loss.  EYES: No blurry or double vision.  ENT: No tinnitus. No postnasal drip. No redness of the oropharynx.  RESPIRATORY: No cough, no wheeze, no hemoptysis. No dyspnea.  CARDIOVASCULAR: No chest pain. No orthopnea. No palpitations. No syncope.  GASTROINTESTINAL: No nausea, no vomiting or diarrhea. No abdominal pain. No melena or hematochezia.  GENITOURINARY: Patient has vaginal discharge significant pelvic pain ENDOCRINE: No polyuria or nocturia. No heat or cold intolerance.  HEMATOLOGY: No anemia. No bruising. No bleeding.  INTEGUMENTARY: No rashes. No lesions.  MUSCULOSKELETAL: No arthritis. No swelling. No gout.  NEUROLOGIC: No numbness, tingling, or ataxia. No seizure-type activity.  PSYCHIATRIC: No anxiety. No insomnia. No ADD.    Vitals:   Filed Vitals:   06/13/15 1200 06/13/15 1508 06/13/15 2138 06/14/15 0527  BP: 124/76 114/65 110/57 109/61  Pulse: 105 109 111 96  Temp: 98.1 F (36.7 C) 98.2 F (36.8 C) 98.2 F (36.8 C) 98.7 F (37.1 C)  TempSrc:  Oral Oral Oral  Resp: 20  18 17   Height:      Weight:      SpO2: 100% 100% 100% 100%    Wt Readings from Last 3 Encounters:  06/13/15 73.9 kg (162 lb 14.7 oz) (89 %*, Z = 1.23)  05/08/12 77.111 kg (170 lb) (94 %*, Z =  1.58)  04/26/11 83.462 kg (184 lb) (97 %*, Z = 1.90)   * Growth percentiles are based on CDC 2-20 Years data.     Intake/Output Summary (Last 24 hours) at 06/14/15 1038 Last data filed at 06/14/15 0705  Gross per 24 hour  Intake      0 ml  Output    900 ml  Net   -900 ml    Physical Exam:   GENERAL: Pleasant-appearing in no apparent distress.  HEAD, EYES, EARS, NOSE AND THROAT: Atraumatic, normocephalic. Extraocular muscles are intact. Pupils equal and  reactive to light. Sclerae anicteric. No conjunctival injection. No oro-pharyngeal erythema.  NECK: Supple. There is no jugular venous distention. No bruits, no lymphadenopathy, no thyromegaly.  HEART: Regular rate and rhythm,. No murmurs, no rubs, no clicks.  LUNGS: Clear to auscultation bilaterally. No rales or rhonchi. No wheezes.  ABDOMEN: Soft, flat, nontender, nondistended. Has good bowel sounds. No hepatosplenomegaly appreciated. Lower abdomen tenderness EXTREMITIES: No evidence of any cyanosis, clubbing, or peripheral edema.  +2 pedal and radial pulses bilaterally.  NEUROLOGIC: The patient is alert, awake, and oriented x3 with no focal motor or sensory deficits appreciated bilaterally.  SKIN: Moist and warm with no rashes appreciated.  Psych: Not anxious, depressed LN: No inguinal LN enlargement    Antibiotics   Anti-infectives    Start     Dose/Rate Route Frequency Ordered Stop   06/14/15 1000  fluconazole (DIFLUCAN) IVPB 100 mg     100 mg 50 mL/hr over 60 Minutes Intravenous Every 24 hours 06/13/15 1317     06/13/15 1200  fluconazole (DIFLUCAN) tablet 150 mg  Status:  Discontinued     150 mg Oral Daily 06/13/15 1156 06/13/15 1317   06/13/15 1000  Ampicillin-Sulbactam (UNASYN) 3 g in sodium chloride 0.9 % 100 mL IVPB  Status:  Discontinued     3 g 100 mL/hr over 60 Minutes Intravenous Every 6 hours 06/13/15 0913 06/13/15 1640   06/13/15 1000  doxycycline (VIBRAMYCIN) 100 mg in dextrose 5 % 250 mL IVPB  Status:   Discontinued     100 mg 125 mL/hr over 120 Minutes Intravenous Every 12 hours 06/13/15 0913 06/13/15 1640   06/12/15 1445  fluconazole (DIFLUCAN) tablet 150 mg     150 mg Oral  Once 06/12/15 1435 06/12/15 1601      Medications   Scheduled Meds: . docusate sodium  100 mg Oral BID  . enoxaparin (LOVENOX) injection  40 mg Subcutaneous Q24H  . fluconazole (DIFLUCAN) IV  100 mg Intravenous Q24H  . insulin aspart  0-9 Units Subcutaneous TID AC & HS  . insulin glargine  30 Units Subcutaneous Daily  . sodium chloride flush  3 mL Intravenous Q12H   Continuous Infusions: . sodium chloride 125 mL/hr at 06/14/15 0705   PRN Meds:.acetaminophen **OR** acetaminophen, morphine injection, ondansetron **OR** ondansetron (ZOFRAN) IV   Data Review:   Micro Results Recent Results (from the past 240 hour(s))  Rapid Influenza A&B Antigens ([facility] only)     Status: None   Collection Time: 06/12/15  4:14 PM  Result Value Ref Range Status   Influenza A ([facility]) NEGATIVE NEGATIVE Final   Influenza B ([facility]) NEGATIVE NEGATIVE Final  MRSA PCR Screening     Status: None   Collection Time: 06/12/15  4:54 PM  Result Value Ref Range Status   MRSA by PCR NEGATIVE NEGATIVE Final    Comment:        The GeneXpert MRSA Assay (FDA approved for NASAL specimens only), is one component of a comprehensive MRSA colonization surveillance program. It is not intended to diagnose MRSA infection nor to guide or monitor treatment for MRSA infections.   Urine culture     Status: Abnormal   Collection Time: 06/12/15  4:54 PM  Result Value Ref Range Status   Specimen Description URINE, RANDOM  Final   Special Requests NONE  Final   Culture (A)  Final    >=100,000 COLONIES/mL GROUP B STREP(S.AGALACTIAE)ISOLATED Virtually 100% of S. agalactiae (Group B) strains are susceptible to Penicillin.  For Penicillin-allergic patients,  Erythromycin (85-95% sensitive) and Clindamycin (80% sensitive) are drugs of choice. Contact  microbiology lab to request sensitivities if  needed within 7 days.    Report Status 06/13/2015 FINAL  Final  Chlamydia/NGC rt PCR ([facility] only)     Status: None   Collection Time: 06/12/15  4:54 PM  Result Value Ref Range Status   Specimen source GC/Chlam URINE, RANDOM  Final   Chlamydia Tr NOT DETECTED NOT DETECTED Final   N gonorrhoeae NOT DETECTED NOT DETECTED Final    Comment: (NOTE) 100  This methodology has not been evaluated in pregnant women or in 200  patients with a history of hysterectomy. 300 400  This methodology will not be performed on patients less than 15  years of age.     Radiology Reports Dg Chest 2 View  06/12/2015  CLINICAL DATA:  Cough 0 1 week EXAM: CHEST  2 VIEW COMPARISON:  None. FINDINGS: The heart size and mediastinal contours are within normal limits. Both lungs are clear. The visualized skeletal structures are unremarkable. IMPRESSION: No active cardiopulmonary disease. Electronically Signed   By: Alcide Clever M.D.   On: 06/12/2015 14:18     CBC  Recent Labs Lab 06/12/15 1028 06/13/15 0537  WBC 15.3* 11.6*  HGB 13.7 11.5*  HCT 40.9 33.8*  PLT 265 224  MCV 91.4 91.3  MCH 30.6 31.0  MCHC 33.4 33.9  RDW 12.9 12.7  LYMPHSABS 1.1  --   MONOABS 1.9*  --   EOSABS 0.0  --   BASOSABS 0.0  --     Chemistries   Recent Labs Lab 06/12/15 1028 06/12/15 1711 06/12/15 2107 06/13/15 0115 06/13/15 0537  NA 130* 135 132* 134* 135  K 4.6 3.6 3.6 3.5 3.4*  CL 101 110 109 110 111  CO2 9* 12* 13* 16* 16*  GLUCOSE 360* 134* 294* 265* 189*  BUN 5* <5*  CREATININE 0.72 0.46 0.47 0.51 0.50  CALCIUM 9.7 8.2* 8.3* 8.5* 8.4*  MG  --   --   --   --  1.7  AST 13*  --   --   --  10*  ALT 12*  --   --   --  9*  ALKPHOS 91  --   --   --  58  BILITOT 1.0  --   --   --  0.4   ------------------------------------------------------------------------------------------------------------------ estimated creatinine clearance is 114.1 mL/min (by C-G formula  based on Cr of 0.5). ------------------------------------------------------------------------------------------------------------------  Recent Labs  06/13/15 0537  HGBA1C 12.6*   ------------------------------------------------------------------------------------------------------------------ No results for input(s): CHOL, HDL, LDLCALC, TRIG, CHOLHDL, LDLDIRECT in the last 72 hours. ------------------------------------------------------------------------------------------------------------------ No results for input(s): TSH, T4TOTAL, T3FREE, THYROIDAB in the last 72 hours.  Invalid input(s): FREET3 ------------------------------------------------------------------------------------------------------------------ No results for input(s): VITAMINB12, FOLATE, FERRITIN, TIBC, IRON, RETICCTPCT in the last 72 hours.  Coagulation profile No results for input(s): INR, PROTIME in the last 168 hours.  No results for input(s): DDIMER in the last 72 hours.  Cardiac Enzymes No results for input(s): CKMB, TROPONINI, MYOGLOBIN in the last 168 hours.  Invalid input(s): CK ------------------------------------------------------------------------------------------------------------------ Invalid input(s): POCBNP    Assessment & Plan   Mandy Patel is a 20 y.o. female with a known history of nsulin requiring diabetes mellitus is presenting to the ED complaining of nausea pelvic pain vaginal discharge  #DKA with history of insulin requiring diabetes mellitus DKA is resolved Started on  Lantus, NovoLog.   #Pelvic pain with vaginal discharge Vaginal candidiasis: Patient  needs Diflucan for 7 days. Sent serology for HSV.  #Pseudohyponatremia from hyperglycemia Resolved with bg improved  Continue IV hydration secondary to low CO2.  Hypophosphatemia, hypokalemia improving.  Likely discharge tomorrow.    Code Status Orders        Start     Ordered   06/12/15 1708  Full code    Continuous     06/12/15 1707    Code Status History    Date Active Date Inactive Code Status Order ID Comments User Context   This patient has a current code status but no historical code status.           Consults  GYN   DVT Prophylaxis  Lovenox  Lab Results  Component Value Date   PLT 224 06/13/2015     Time Spent in minutes    25 min Mandy Patel M.D on 06/14/2015 at 10:38 AM  Between 7am to 6pm - Pager - [phone]  After 6pm go to [email or web address] - password [email or web address] [facility]  [facility]   Office  [phone]

## 2015-06-14 NOTE — Progress Notes (Signed)
Noted that orders for Lantus insulin was a one time order yesterday. Recommend starting Lantus 30 units daily as per diabetes coordinator conversation with patient on 5/12. Patient has Type 1 diabetes and needs basal insulin. Recommend Lantus 30 units daily and continue Novolog SENSITIVE correction scale TID & HS.  Will continue to monitor blood sugars while in the hosptial. Smith MinceKendra Roddy Bellamy RN BSN CDE

## 2015-06-15 LAB — COMPREHENSIVE METABOLIC PANEL
ALK PHOS: 58 U/L (ref 38–126)
ALT: 9 U/L — ABNORMAL LOW (ref 14–54)
ANION GAP: 9 (ref 5–15)
AST: 13 U/L — AB (ref 15–41)
Albumin: 3 g/dL — ABNORMAL LOW (ref 3.5–5.0)
BILIRUBIN TOTAL: 0.4 mg/dL (ref 0.3–1.2)
CALCIUM: 8.4 mg/dL — AB (ref 8.9–10.3)
CO2: 26 mmol/L (ref 22–32)
Chloride: 102 mmol/L (ref 101–111)
Creatinine, Ser: 0.34 mg/dL — ABNORMAL LOW (ref 0.44–1.00)
GFR calc Af Amer: >60 mL/min (ref >60)
Glucose, Bld: 267 mg/dL — ABNORMAL HIGH (ref 65–99)
POTASSIUM: 2.9 mmol/L — AB (ref 3.5–5.1)
Sodium: 137 mmol/L (ref 135–145)
TOTAL PROTEIN: 6.6 g/dL (ref 6.5–8.1)

## 2015-06-15 LAB — GLUCOSE, CAPILLARY
GLUCOSE-CAPILLARY: 151 mg/dL — AB (ref 65–99)
GLUCOSE-CAPILLARY: 247 mg/dL — AB (ref 65–99)
Glucose-Capillary: 221 mg/dL — ABNORMAL HIGH (ref 65–99)
Glucose-Capillary: 265 mg/dL — ABNORMAL HIGH (ref 65–99)

## 2015-06-15 MED ORDER — DEXTROSE 5 % IV SOLN
1.0000 g | INTRAVENOUS | Status: DC
  Administered 2015-06-15 – 2015-06-16 (×2): 1 g via INTRAVENOUS
  Filled 2015-06-15 (×3): qty 10

## 2015-06-15 MED ORDER — CEPHALEXIN 500 MG PO CAPS: 500.0000 mg | ORAL_CAPSULE | Freq: Four times a day (QID) | ORAL | Status: DC

## 2015-06-15 MED ORDER — VALACYCLOVIR HCL 500 MG PO TABS
1000.0000 mg | ORAL_TABLET | Freq: Two times a day (BID) | ORAL | Status: DC
  Administered 2015-06-15 – 2015-06-16 (×3): 1000 mg via ORAL
  Filled 2015-06-15 (×4): qty 2

## 2015-06-15 MED ORDER — INSULIN GLARGINE 100 UNITS/ML SOLOSTAR PEN: 30.0000 [IU] | PEN_INJECTOR | Freq: Every day | SUBCUTANEOUS | Status: DC

## 2015-06-15 MED ORDER — POTASSIUM CHLORIDE CRYS ER 20 MEQ PO TBCR
40.0000 meq | EXTENDED_RELEASE_TABLET | Freq: Four times a day (QID) | ORAL | Status: AC
  Administered 2015-06-15 – 2015-06-16 (×4): 40 meq via ORAL
  Filled 2015-06-15 (×4): qty 2

## 2015-06-15 MED ORDER — FLUCONAZOLE 150 MG PO TABS: 150.0000 mg | ORAL_TABLET | Freq: Every day | ORAL | Status: DC

## 2015-06-15 MED ORDER — POTASSIUM CHLORIDE 10 MEQ/100ML IV SOLN
10.0000 meq | INTRAVENOUS | Status: DC
  Administered 2015-06-15: 10 meq via INTRAVENOUS
  Filled 2015-06-15 (×4): qty 100

## 2015-06-15 NOTE — Care Management Note (Signed)
Case Management Note  Patient Details  Name: Mandy Patel MRN: [id number] Date of Birth: [birth year]  Subjective/Objective:    Ms Mandy Patel has a glucometer at home which she reports that she knows how to use.               Action/Plan:   Expected Discharge Date:                  Expected Discharge Plan:     In-House Referral:     Discharge planning Services     Post Acute Care Choice:    Choice offered to:     DME Arranged:    DME Agency:     HH Arranged:    HH Agency:     Status of Service:     Medicare Important Message Given:    Date Medicare IM Given:    Medicare IM give by:    Date Additional Medicare IM Given:    Additional Medicare Important Message give by:     If discussed at Long Length of Stay Meetings, dates discussed:    Additional Comments:  Delron Comer A, RN 06/15/2015, 10:48 AM

## 2015-06-15 NOTE — Progress Notes (Signed)
     Mandy Patel was admitted to the Hospital on 06/12/2015 and Discharged  06/15/2015 and should be excused from work/school   for 7 days starting 06/12/2015 , may return to work/school without any restrictions.    Katha HammingKONIDENA,Jirah Rider M.D on 06/15/2015,at 9:01 AM

## 2015-06-15 NOTE — Progress Notes (Signed)
[facility] - [place] at [facility]                                                                                                                                                                                            Patient Demographics   Mandy Patel, is a 20 y.o. female, DOB - Jun 03, 1995, WUJ:[id number]  Admit date - 06/12/2015   Admitting Physician Ramonita Lab, MD  Outpatient Primary MD for the patient is No primary care provider on file.   LOS - 3  Subjective:Discharge canceled because patient has severe right vulvar pain. Noticed some bleeding.  Review of Systems:   CONSTITUTIONAL: No documented fever. No fatigue, Positive weakness. No weight gain, no weight loss.  EYES: No blurry or double vision.  ENT: No tinnitus. No postnasal drip. No redness of the oropharynx.  RESPIRATORY: No cough, no wheeze, no hemoptysis. No dyspnea.  CARDIOVASCULAR: No chest pain. No orthopnea. No palpitations. No syncope.  GASTROINTESTINAL: No nausea, no vomiting or diarrhea. No abdominal pain. No melena or hematochezia.  GENITOURINARY: Patient has vaginal discharg, Right vulvar swelling.  ENDOCRINE: No polyuria or nocturia. No heat or cold intolerance.  HEMATOLOGY: No anemia. No bruising. No bleeding.  INTEGUMENTARY: No rashes. No lesions.  MUSCULOSKELETAL: No arthritis. No swelling. No gout.  NEUROLOGIC: No numbness, tingling, or ataxia. No seizure-type activity.  PSYCHIATRIC: No anxiety. No insomnia. No ADD.    Vitals:   Filed Vitals:   06/14/15 0527 06/14/15 1310 06/14/15 2045 06/15/15 0502  BP: 109/61 131/70 109/61 115/64  Pulse: 96 115 112 100  Temp: 98.7 F (37.1 C) 98.7 F (37.1 C) 99 F (37.2 C) 98.3 F (36.8 C)  TempSrc: Oral Oral Oral Oral  Resp: 17 20 18 20   Height:      Weight:      SpO2: 100% 100% 100% 99%    Wt Readings from Last 3 Encounters:  06/13/15 73.9 kg (162 lb 14.7 oz) (89 %*, Z = 1.23)  05/08/12 77.111 kg (170 lb) (94 %*, Z =  1.58)  04/26/11 83.462 kg (184 lb) (97 %*, Z = 1.90)   * Growth percentiles are based on CDC 2-20 Years data.     Intake/Output Summary (Last 24 hours) at 06/15/15 1045 Last data filed at 06/15/15 0800  Gross per 24 hour  Intake    120 ml  Output      0 ml  Net    120 ml    Physical Exam:   GENERAL: Pleasant-appearing in no apparent distress.  HEAD, EYES, EARS, NOSE AND THROAT: Atraumatic, normocephalic. Extraocular muscles are intact. Pupils equal and  reactive to light. Sclerae anicteric. No conjunctival injection. No oro-pharyngeal erythema.  NECK: Supple. There is no jugular venous distention. No bruits, no lymphadenopathy, no thyromegaly.  HEART: Regular rate and rhythm,. No murmurs, no rubs, no clicks.  LUNGS: Clear to auscultation bilaterally. No rales or rhonchi. No wheezes.  ABDOMEN: Soft, flat, nontender, nondistended. Has good bowel sounds. No hepatosplenomegaly appreciated. Lower abdomen tenderness EXTREMITIES: No evidence of any cyanosis, clubbing, or peripheral edema.  +2 pedal and radial pulses bilaterally.  NEUROLOGIC: The patient is alert, awake, and oriented x3 with no focal motor or sensory deficits appreciated bilaterally.  SKIN: Moist and warm with no rashes appreciated.  Psych: Not anxious, depressed LN: No inguinal LN enlargement    Antibiotics   Anti-infectives    Start     Dose/Rate Route Frequency Ordered Stop   06/15/15 0000  cephALEXin (KEFLEX) 500 MG capsule     500 mg Oral 4 times daily 06/15/15 0858     06/15/15 0000  fluconazole (DIFLUCAN) 150 MG tablet     150 mg Oral Daily 06/15/15 0858     06/14/15 1000  fluconazole (DIFLUCAN) IVPB 100 mg     100 mg 50 mL/hr over 60 Minutes Intravenous Every 24 hours 06/13/15 1317     06/13/15 1200  fluconazole (DIFLUCAN) tablet 150 mg  Status:  Discontinued     150 mg Oral Daily 06/13/15 1156 06/13/15 1317   06/13/15 1000  Ampicillin-Sulbactam (UNASYN) 3 g in sodium chloride 0.9 % 100 mL IVPB  Status:   Discontinued     3 g 100 mL/hr over 60 Minutes Intravenous Every 6 hours 06/13/15 0913 06/13/15 1640   06/13/15 1000  doxycycline (VIBRAMYCIN) 100 mg in dextrose 5 % 250 mL IVPB  Status:  Discontinued     100 mg 125 mL/hr over 120 Minutes Intravenous Every 12 hours 06/13/15 0913 06/13/15 1640   06/12/15 1445  fluconazole (DIFLUCAN) tablet 150 mg     150 mg Oral  Once 06/12/15 1435 06/12/15 1601      Medications   Scheduled Meds: . docusate sodium  100 mg Oral BID  . enoxaparin (LOVENOX) injection  40 mg Subcutaneous Q24H  . fluconazole (DIFLUCAN) IV  100 mg Intravenous Q24H  . insulin aspart  0-9 Units Subcutaneous TID AC & HS  . insulin glargine  30 Units Subcutaneous Daily  . sodium chloride flush  3 mL Intravenous Q12H   Continuous Infusions:   PRN Meds:.acetaminophen **OR** acetaminophen, morphine injection, ondansetron **OR** ondansetron (ZOFRAN) IV   Data Review:   Micro Results Recent Results (from the past 240 hour(s))  Rapid Influenza A&B Antigens ([facility] only)     Status: None   Collection Time: 06/12/15  4:14 PM  Result Value Ref Range Status   Influenza A ([facility]) NEGATIVE NEGATIVE Final   Influenza B ([facility]) NEGATIVE NEGATIVE Final  MRSA PCR Screening     Status: None   Collection Time: 06/12/15  4:54 PM  Result Value Ref Range Status   MRSA by PCR NEGATIVE NEGATIVE Final    Comment:        The GeneXpert MRSA Assay (FDA approved for NASAL specimens only), is one component of a comprehensive MRSA colonization surveillance program. It is not intended to diagnose MRSA infection nor to guide or monitor treatment for MRSA infections.   Urine culture     Status: Abnormal   Collection Time: 06/12/15  4:54 PM  Result Value Ref Range Status   Specimen Description URINE, RANDOM  Final   Special Requests NONE  Final   Culture (A)  Final    >=100,000 COLONIES/mL GROUP B STREP(S.AGALACTIAE)ISOLATED Virtually 100% of S. agalactiae (Group B) strains are susceptible  to Penicillin.  For Penicillin-allergic patients, Erythromycin (85-95% sensitive) and Clindamycin (80% sensitive) are drugs of choice. Contact microbiology lab to request sensitivities if  needed within 7 days.    Report Status 06/13/2015 FINAL  Final  Chlamydia/NGC rt PCR ([facility] only)     Status: None   Collection Time: 06/12/15  4:54 PM  Result Value Ref Range Status   Specimen source GC/Chlam URINE, RANDOM  Final   Chlamydia Tr NOT DETECTED NOT DETECTED Final   N gonorrhoeae NOT DETECTED NOT DETECTED Final    Comment: (NOTE) 100  This methodology has not been evaluated in pregnant women or in 200  patients with a history of hysterectomy. 300 400  This methodology will not be performed on patients less than 56  years of age.     Radiology Reports Dg Chest 2 View  06/12/2015  CLINICAL DATA:  Cough 0 1 week EXAM: CHEST  2 VIEW COMPARISON:  None. FINDINGS: The heart size and mediastinal contours are within normal limits. Both lungs are clear. The visualized skeletal structures are unremarkable. IMPRESSION: No active cardiopulmonary disease. Electronically Signed   By: Alcide Clever M.D.   On: 06/12/2015 14:18     CBC  Recent Labs Lab 06/12/15 1028 06/13/15 0537  WBC 15.3* 11.6*  HGB 13.7 11.5*  HCT 40.9 33.8*  PLT 265 224  MCV 91.4 91.3  MCH 30.6 31.0  MCHC 33.4 33.9  RDW 12.9 12.7  LYMPHSABS 1.1  --   MONOABS 1.9*  --   EOSABS 0.0  --   BASOSABS 0.0  --     Chemistries   Recent Labs Lab 06/12/15 1028 06/12/15 1711 06/12/15 2107 06/13/15 0115 06/13/15 0537 06/14/15 0407  NA 130* 135 132* 134* 135 139  K 4.6 3.6 3.6 3.5 3.4* 3.3*  CL 101 110 109 110 111 110  CO2 9* 12* 13* 16* 16* 23  GLUCOSE 360* 134* 294* 265* 189* 198*  BUN 5* <5* <5*  CREATININE 0.72 0.46 0.47 0.51 0.50 0.34*  CALCIUM 9.7 8.2* 8.3* 8.5* 8.4* 8.3*  MG  --   --   --   --  1.7  --   AST 13*  --   --   --  10*  --   ALT 12*  --   --   --  9*  --   ALKPHOS 91  --   --   --  58  --    BILITOT 1.0  --   --   --  0.4  --    ------------------------------------------------------------------------------------------------------------------ estimated creatinine clearance is 114.1 mL/min (by C-G formula based on Cr of 0.34). ------------------------------------------------------------------------------------------------------------------  Recent Labs  06/13/15 0537  HGBA1C 12.6*   ------------------------------------------------------------------------------------------------------------------ No results for input(s): CHOL, HDL, LDLCALC, TRIG, CHOLHDL, LDLDIRECT in the last 72 hours. ------------------------------------------------------------------------------------------------------------------ No results for input(s): TSH, T4TOTAL, T3FREE, THYROIDAB in the last 72 hours.  Invalid input(s): FREET3 ------------------------------------------------------------------------------------------------------------------ No results for input(s): VITAMINB12, FOLATE, FERRITIN, TIBC, IRON, RETICCTPCT in the last 72 hours.  Coagulation profile No results for input(s): INR, PROTIME in the last 168 hours.  No results for input(s): DDIMER in the last 72 hours.  Cardiac Enzymes No results for input(s): CKMB, TROPONINI, MYOGLOBIN in the last 168 hours.  Invalid input(s): CK ------------------------------------------------------------------------------------------------------------------ Invalid input(s): POCBNP  Assessment & Plan   Mandy Patel is a 20 y.o. female with a known history of nsulin requiring diabetes mellitus is presenting to the ED complaining of nausea pelvic pain vaginal discharge  #DKA with history of insulin requiring diabetes mellitus DKA is resolved Started on  Lantus, NovoLog.   #Pelvic pain with vaginal discharge Vaginal candidiasis: Patient needs Diflucan for 7 days. Sent serology for HSV. Patient also has streptococcal UTI. Started on Keflex. Has  right vulvar swelling and possible abscess:  GYN follow-up is appreciated. Use warm compressions. Has a lot of pain use pain medication. Tachycardia secondary to pain. Patient also is upset about her pain and she is crying. Hold the discharge continue pain medicine continue antibiotics GYN follow-up is appreciated.  #Pseudohyponatremia from hyperglycemia Resolved with bg improved  Continue IV hydration secondary to low CO2.  Hypophosphatemia, hypokalemia improving.  Likely discharge tomorrow.    Code Status Orders        Start     Ordered   06/12/15 1708  Full code   Continuous     06/12/15 1707    Code Status History    Date Active Date Inactive Code Status Order ID Comments User Context   This patient has a current code status but no historical code status.           Consults  GYN   DVT Prophylaxis  Lovenox  Lab Results  Component Value Date   PLT 224 06/13/2015     Time Spent in minutes    25 min Yoneko Talerico M.D on 06/15/2015 at 10:45 AM  Between 7am to 6pm - Pager - [phone]  After 6pm go to [email or web address] - password [email or web address] [facility]  [facility]   Office  [phone]

## 2015-06-15 NOTE — Progress Notes (Signed)
Daily Benign Gynecology Progress Note Kamyla Cordelia Poche Girdner  [id number]  HD#4  Subjective:  Asked to see patient again by primary team due to worsening vulvar pain and painful urination.  Patient states that after initially receiving antibiotics her pain improved and voiding was not as painful.  However, over the past day her pain has increased along with vulvar swelling and painful urination.  She denies fevers and chills.  She denies a history of herpes genitalis.  Objective:  Most recent vitals Temp: 98.3 F (36.8 C)  BP: 115/64 mmHg  Pulse Rate: 100  Resp: 20  SpO2: 99 %   Vitals Range over 24 hours BP  Min: 109/61  Max: 115/64 Temp  Avg: 98.7 F (37.1 C)  Min: 98.3 F (36.8 C)  Max: 99 F (37.2 C) Pulse  Avg: 106  Min: 100  Max: 112 SpO2  Avg: 99.5 %  Min: 99 %  Max: 100 %   Physical Exam General: in mild to moderate distress Pelvic:  Mild-moderate edema on R>L labia majora with significant erosive/ulcerative lesions with layer of exudate.  These lesions are exquisitely tender to palpation.  There is edema at the introitus and it is difficult to assess the extent of extension of the lesions into the vagina given the patient's significant discomfort with the exam. No discrete fluctuance is appreciated below the epidermal layer upon palpation.    AM Labs Lab Results  Component Value Date   WBC 11.6* 06/13/2015   HGB 11.5* 06/13/2015   HCT 33.8* 06/13/2015   PLT 224 06/13/2015   NA 139 06/14/2015   K 3.3* 06/14/2015   CREATININE 0.34* 06/14/2015   BUN <5* 06/14/2015     Assessment:  Sunday A Freida Busmanllen is a 20 y.o. female HD#4 admitted with DKA, vulvar infection.  The differential is wide in the setting of DKA and uncontrolled blood glucose levels. The exam is consistent with herpes labialis.  However, a severe candidal infection can not be ruled out along with other less common skin lesions.  Given that herpes testing from a swab two days ago has not returned and lack of response to  treatment for vulvovaginal candidiasis treatment, I recommend treating for primary HSV infection.  Although the HSV culture is still pending, false negative results are common.  A positive result would certainly support the diagnosis.    Plan:  1) CMP to assess for hepatitis associated with HSV infection, given the severity of appearance of the infection and her immunocompromised state (DKA).  (ordered) 2) start valacyclovir 1 gram PO BID x 10 days (ordered) 3) supportive measures, like sitz baths for symptom control. 4) assess for urinary retention and the need for an indwelling catheter given her edema. 5) superinfection may occur, so monitor for signs of bacterial superinfection.  Conard NovakJackson, Terrianna Holsclaw D, MD  06/15/2015 1:45 PM

## 2015-06-16 LAB — BASIC METABOLIC PANEL
Anion gap: 6 (ref 5–15)
BUN: <5 mg/dL — ABNORMAL LOW (ref 6–20)
CALCIUM: 8.4 mg/dL — AB (ref 8.9–10.3)
CO2: 26 mmol/L (ref 22–32)
Chloride: 105 mmol/L (ref 101–111)
Glucose, Bld: 201 mg/dL — ABNORMAL HIGH (ref 65–99)
Potassium: 3.6 mmol/L (ref 3.5–5.1)
Sodium: 137 mmol/L (ref 135–145)

## 2015-06-16 LAB — HSV 1 AND 2 IGM ABS, INDIRECT: HSV 1 IgM Antibodies: <1:10 {titer}

## 2015-06-16 LAB — GLUCOSE, CAPILLARY
GLUCOSE-CAPILLARY: 176 mg/dL — AB (ref 65–99)
GLUCOSE-CAPILLARY: 216 mg/dL — AB (ref 65–99)
Glucose-Capillary: 272 mg/dL — ABNORMAL HIGH (ref 65–99)

## 2015-06-16 LAB — HSV 2 ANTIBODY, IGG: HSV 2 GLYCOPROTEIN G AB, IGG: 1.18 {index} — AB (ref 0.00–0.90)

## 2015-06-16 MED ORDER — INSULIN GLARGINE 100 UNITS/ML SOLOSTAR PEN: 30.0000 [IU] | PEN_INJECTOR | Freq: Every day | SUBCUTANEOUS | Status: DC

## 2015-06-16 MED ORDER — VALACYCLOVIR HCL 1 G PO TABS: 1000.0000 mg | ORAL_TABLET | Freq: Two times a day (BID) | ORAL | Status: DC

## 2015-06-16 MED ORDER — INSULIN ASPART 100 UNIT/ML ~~LOC~~ SOLN: 9.0000 [IU] | Freq: Three times a day (TID) | SUBCUTANEOUS | Status: DC

## 2015-06-16 NOTE — Progress Notes (Signed)
[facility] [place] [facility]                                                                                                                                                                                            Patient Demographics   Mandy Freida Busmanllen, is a 20 y.o. female, DOB - Sep 08, 1995, ZOX:[id number]  Admit date - 06/12/2015   Admitting Physician Ramonita LabAruna Gouru, MD  Outpatient Primary MD for the patient is No primary care provider on file.   LOS - 4  SubjectivePatient is seen at bedside and complains of vaginal bleeding, some abdominal pain. Her pain in the right vulvar area is better than yesterday..  Review of Systems:   CONSTITUTIONAL: No documented fever. No fatigue, Positive weakness. No weight gain, no weight loss.  EYES: No blurry or double vision.  ENT: No tinnitus. No postnasal drip. No redness of the oropharynx.  RESPIRATORY: No cough, no wheeze, no hemoptysis. No dyspnea.  CARDIOVASCULAR: No chest pain. No orthopnea. No palpitations. No syncope.  GASTROINTESTINAL: No nausea, no vomiting or diarrhea. No abdominal pain. No melena or hematochezia.  GENITOURINARY: Patient has vaginal discharg, Right vulvar swelling.  ENDOCRINE: No polyuria or nocturia. No heat or cold intolerance.  HEMATOLOGY: No anemia. No bruising. No bleeding.  INTEGUMENTARY: No rashes. No lesions.  MUSCULOSKELETAL: No arthritis. No swelling. No gout.  NEUROLOGIC: No numbness, tingling, or ataxia. No seizure-type activity.  PSYCHIATRIC: No anxiety. No insomnia. No ADD.    Vitals:   Filed Vitals:   06/14/15 2045 06/15/15 0502 06/15/15 2047 06/16/15 0439  BP: 109/61 115/64 125/75 102/62  Pulse: 112 100 111 94  Temp: 99 F (37.2 C) 98.3 F (36.8 C) 98.8 F (37.1 C) 97.9 F (36.6 C)  TempSrc: Oral Oral Oral Oral  Resp: 18 20    Height:      Weight:      SpO2: 100% 99% 100% 99%    Wt Readings from Last 3 Encounters:  06/13/15 73.9 kg (162 lb 14.7 oz) (89 %*, Z =  1.23)  05/08/12 77.111 kg (170 lb) (94 %*, Z = 1.58)  04/26/11 83.462 kg (184 lb) (97 %*, Z = 1.90)   * Growth percentiles are based on CDC 2-20 Years data.     Intake/Output Summary (Last 24 hours) at 06/16/15 1132 Last data filed at 06/16/15 0900  Gross per 24 hour  Intake    960 ml  Output      0 ml  Net    960 ml    Physical Exam:   GENERAL: Pleasant-appearing in no apparent distress.  HEAD, EYES, EARS,  NOSE AND THROAT: Atraumatic, normocephalic. Extraocular muscles are intact. Pupils equal and reactive to light. Sclerae anicteric. No conjunctival injection. No oro-pharyngeal erythema.  NECK: Supple. There is no jugular venous distention. No bruits, no lymphadenopathy, no thyromegaly.  HEART: Regular rate and rhythm,. No murmurs, no rubs, no clicks.  LUNGS: Clear to auscultation bilaterally. No rales or rhonchi. No wheezes.  ABDOMEN: Soft, flat, nontender, nondistended. Has good bowel sounds. No hepatosplenomegaly appreciated. Lower abdomen tenderness EXTREMITIES: No evidence of any cyanosis, clubbing, or peripheral edema.  +2 pedal and radial pulses bilaterally.  NEUROLOGIC: The patient is alert, awake, and oriented x3 with no focal motor or sensory deficits appreciated bilaterally.  SKIN: Moist and warm with no rashes appreciated.  Psych: Not anxious, depressed LN: No inguinal LN enlargement    Antibiotics   Anti-infectives    Start     Dose/Rate Route Frequency Ordered Stop   06/15/15 1400  valACYclovir (VALTREX) tablet 1,000 mg     1,000 mg Oral 2 times daily 06/15/15 1355 06/25/15 0959   06/15/15 1100  cefTRIAXone (ROCEPHIN) 1 g in dextrose 5 % 50 mL IVPB     1 g 100 mL/hr over 30 Minutes Intravenous Every 24 hours 06/15/15 1047     06/15/15 0000  cephALEXin (KEFLEX) 500 MG capsule     500 mg Oral 4 times daily 06/15/15 0858     06/15/15 0000  fluconazole (DIFLUCAN) 150 MG tablet     150 mg Oral Daily 06/15/15 0858     06/14/15 1000  fluconazole (DIFLUCAN) IVPB  100 mg     100 mg 50 mL/hr over 60 Minutes Intravenous Every 24 hours 06/13/15 1317     06/13/15 1200  fluconazole (DIFLUCAN) tablet 150 mg  Status:  Discontinued     150 mg Oral Daily 06/13/15 1156 06/13/15 1317   06/13/15 1000  Ampicillin-Sulbactam (UNASYN) 3 g in sodium chloride 0.9 % 100 mL IVPB  Status:  Discontinued     3 g 100 mL/hr over 60 Minutes Intravenous Every 6 hours 06/13/15 0913 06/13/15 1640   06/13/15 1000  doxycycline (VIBRAMYCIN) 100 mg in dextrose 5 % 250 mL IVPB  Status:  Discontinued     100 mg 125 mL/hr over 120 Minutes Intravenous Every 12 hours 06/13/15 0913 06/13/15 1640   06/12/15 1445  fluconazole (DIFLUCAN) tablet 150 mg     150 mg Oral  Once 06/12/15 1435 06/12/15 1601      Medications   Scheduled Meds: . cefTRIAXone (ROCEPHIN)  IV  1 g Intravenous Q24H  . docusate sodium  100 mg Oral BID  . fluconazole (DIFLUCAN) IV  100 mg Intravenous Q24H  . insulin aspart  0-9 Units Subcutaneous TID AC & HS  . insulin glargine  30 Units Subcutaneous Daily  . potassium chloride  40 mEq Oral Q6H  . sodium chloride flush  3 mL Intravenous Q12H  . valACYclovir  1,000 mg Oral BID   Continuous Infusions:   PRN Meds:.acetaminophen **OR** acetaminophen, morphine injection, ondansetron **OR** ondansetron (ZOFRAN) IV   Data Review:   Micro Results Recent Results (from the past 240 hour(s))  Rapid Influenza A&B Antigens ([facility] only)     Status: None   Collection Time: 06/12/15  4:14 PM  Result Value Ref Range Status   Influenza A ([facility]) NEGATIVE NEGATIVE Final   Influenza B [facility]) NEGATIVE NEGATIVE Final  MRSA PCR Screening     Status: None   Collection Time: 06/12/15  4:54 PM  Result Value Ref  Range Status   MRSA by PCR NEGATIVE NEGATIVE Final    Comment:        The GeneXpert MRSA Assay (FDA approved for NASAL specimens only), is one component of a comprehensive MRSA colonization surveillance program. It is not intended to diagnose MRSA infection nor to  guide or monitor treatment for MRSA infections.   Urine culture     Status: Abnormal   Collection Time: 06/12/15  4:54 PM  Result Value Ref Range Status   Specimen Description URINE, RANDOM  Final   Special Requests NONE  Final   Culture (A)  Final    >=100,000 COLONIES/mL GROUP B STREP(S.AGALACTIAE)ISOLATED Virtually 100% of S. agalactiae (Group B) strains are susceptible to Penicillin.  For Penicillin-allergic patients, Erythromycin (85-95% sensitive) and Clindamycin (80% sensitive) are drugs of choice. Contact microbiology lab to request sensitivities if  needed within 7 days.    Report Status 06/13/2015 FINAL  Final  Chlamydia/NGC rt PCR ([facility] only)     Status: None   Collection Time: 06/12/15  4:54 PM  Result Value Ref Range Status   Specimen source GC/Chlam URINE, RANDOM  Final   Chlamydia Tr NOT DETECTED NOT DETECTED Final   N gonorrhoeae NOT DETECTED NOT DETECTED Final    Comment: (NOTE) 100  This methodology has not been evaluated in pregnant women or in 200  patients with a history of hysterectomy. 300 400  This methodology will not be performed on patients less than 54  years of age.     Radiology Reports Dg Chest 2 View  06/12/2015  CLINICAL DATA:  Cough 0 1 week EXAM: CHEST  2 VIEW COMPARISON:  None. FINDINGS: The heart size and mediastinal contours are within normal limits. Both lungs are clear. The visualized skeletal structures are unremarkable. IMPRESSION: No active cardiopulmonary disease. Electronically Signed   By: Alcide Clever M.D.   On: 06/12/2015 14:18     CBC  Recent Labs Lab 06/12/15 1028 06/13/15 0537  WBC 15.3* 11.6*  HGB 13.7 11.5*  HCT 40.9 33.8*  PLT 265 224  MCV 91.4 91.3  MCH 30.6 31.0  MCHC 33.4 33.9  RDW 12.9 12.7  LYMPHSABS 1.1  --   MONOABS 1.9*  --   EOSABS 0.0  --   BASOSABS 0.0  --     Chemistries   Recent Labs Lab 06/12/15 1028  06/13/15 0115 06/13/15 0537 06/14/15 0407 06/15/15 1426 06/16/15 0840  NA 130*  < >  134* 135 139 137 137  K 4.6  < > 3.5 3.4* 3.3* 2.9* 3.6  CL 101  < > 110 111 110 102 105  CO2 9*  < > 16* 16* GLUCOSE 360*  < > 265* 189* 198* 267* 201*  BUN 11  < > 5* <5* <5* <5* <5*  CREATININE 0.72  < > 0.51 0.50 0.34* 0.34* <0.30*  CALCIUM 9.7  < > 8.5* 8.4* 8.3* 8.4* 8.4*  MG  --   --   --  1.7  --   --   --   AST 13*  --   --  10*  --  13*  --   ALT 12*  --   --  9*  --  9*  --   ALKPHOS 91  --   --  58  --  58  --   BILITOT 1.0  --   --  0.4  --  0.4  --   < > = values in  this interval not displayed. ------------------------------------------------------------------------------------------------------------------ CrCl cannot be calculated (Patient has no serum creatinine result on file.). ------------------------------------------------------------------------------------------------------------------ No results for input(s): HGBA1C in the last 72 hours. ------------------------------------------------------------------------------------------------------------------ No results for input(s): CHOL, HDL, LDLCALC, TRIG, CHOLHDL, LDLDIRECT in the last 72 hours. ------------------------------------------------------------------------------------------------------------------ No results for input(s): TSH, T4TOTAL, T3FREE, THYROIDAB in the last 72 hours.  Invalid input(s): FREET3 ------------------------------------------------------------------------------------------------------------------ No results for input(s): VITAMINB12, FOLATE, FERRITIN, TIBC, IRON, RETICCTPCT in the last 72 hours.  Coagulation profile No results for input(s): INR, PROTIME in the last 168 hours.  No results for input(s): DDIMER in the last 72 hours.  Cardiac Enzymes No results for input(s): CKMB, TROPONINI, MYOGLOBIN in the last 168 hours.  Invalid input(s): CK ------------------------------------------------------------------------------------------------------------------ Invalid input(s):  POCBNP    Assessment & Plan   Mandy Patel is a 20 y.o. female with a known history of nsulin requiring diabetes mellitus is presenting to the ED complaining of nausea pelvic pain vaginal discharge  #DKA with history of insulin requiring diabetes mellitus DKA is resolved Started on  Lantus, NovoLog. Continue them.  #Pelvic pain with vaginal discharge Vaginal candidiasis: Patient needs Diflucan for 7 days.  Patient has possible HSV infection with severe vaginitis, swelling of the right vulvar area with friability of mucosa causing bleeding .continue Valtrex 1 g by mouth twice a day 10 days. GYN following the patient,   check a bladder scan for postvoid residual. Monitor closely today also. Continue pain medicine.   Patient also has streptococcal UTI. Started on Keflex. #Pseudohyponatremia from hyperglycemia Resolved with bg improved    Severe hypokalemia: Replaced. improving  Likely discharge tomorrow.    Code Status Orders        Start     Ordered   06/12/15 1708  Full code   Continuous     06/12/15 1707    Code Status History    Date Active Date Inactive Code Status Order ID Comments User Context   This patient has a current code status but no historical code status.           Consults  GYN   DVT Prophylaxis  Lovenox  Lab Results  Component Value Date   PLT 224 06/13/2015     Time Spent in minutes    25 min Manvi Guilliams M.D on 06/16/2015 at 11:32 AM  Between 7am to 6pm - Pager - [phone]  After 6pm go to [email or web address] - password [email or web address] [facility]  [facility]   Office  [phone]

## 2015-06-16 NOTE — Progress Notes (Signed)
Inpatient Diabetes Program Recommendations  AACE/ADA: New Consensus Statement on Inpatient Glycemic Control (2015)  Target Ranges:  Prepandial:   less than 140 mg/dL      Peak postprandial:   less than 180 mg/dL (1-2 hours)      Critically ill patients:  140 - 180 mg/dL  Results for Mandy Patel, Mandy Patel (MRN [id number]) as of 06/16/2015 07:44  Ref. Range 06/15/2015 08:22 06/15/2015 11:28 06/15/2015 17:07 06/15/2015 20:25  Glucose-Capillary Latest Ref Range: 65-99 mg/dL 409151 (H) 811221 (H) 914247 (H) 265 (H)   Review of Glycemic Control  Diabetes history: DM1 Outpatient Diabetes medications: Lantus 30 units QHS, Novolog TID with meals (meal coverage plus correction) Current orders for Inpatient glycemic control: Lantus 30 units daily, Novolog 0-9 units ACHS  Inpatient Diabetes Program Recommendations: Insulin - Meal Coverage: Patient has Type 1 diabetes; therefore she will require meal coverage insulin (in addition to basal and correction). Please order Novolog 5 units TID with meals for meal coverage if patient eats at least 50% of meals.  Thanks, Orlando PennerMarie Adasha Boehme, RN, MSN, CDE Diabetes Coordinator Inpatient Diabetes Program [phone] (Team Pager from 8am to 5pm) [phone] ([facility] office) [phone] [facility] office) [phone] [facility] office)

## 2015-06-16 NOTE — Progress Notes (Signed)
Discharge instructions given and went over with patient and mother at bedside. Prescriptions given. All questions answered. Patient discharged home via wheelchair by nursing staff. [name],Jamilya Sarrazin S, RN

## 2015-06-17 NOTE — Discharge Summary (Signed)
Mandy Patel, is a 20 y.o. female  DOB [birth year]  MRN [id number].  Admission date:  06/12/2015  Admitting Physician  Ramonita LabAruna Gouru, MD  Discharge Date:  06/16/2015   Primary MD  No primary care provider on file.  Recommendations for primary care physician for things to follow:   [facility] 1 week. Follow-up with primary doctor in 1 week.   Admission Diagnosis  Diabetic ketoacidosis without coma associated with type 1 diabetes mellitus (HCC) [E10.10]   Discharge Diagnosis  Diabetic ketoacidosis without coma associated with type 1 diabetes mellitus (HCC) [E10.10]   Active Problems:   DKA (diabetic ketoacidoses) (HCC)      Past Medical History  Diagnosis Date  . Diabetes mellitus     type I, diagnosed age 20yo    Past Surgical History  Procedure Laterality Date  . Eye muscle surgery  approx 2010    bilat eye surgury, left exotropia worse;Dr Maple HudsonYoung, opthomology       History of present illness and  Hospital Course:     Kindly see H&P for history of present illness and admission details, please review complete Labs, Consult reports and Test reports for all details in brief  HPI  from the history and physical done on the day of admission 20 year old female patient admitted on May 11 for DKA. Patient had nausea, tiredness and URI symptoms 3-4 days before. Anion gap on admission 20, pH 7.19. Admitted to intensive care unit for DKA, started on aggressive hydration, insulin drip. Patient has history of insulin requiring diabetes mellitus type    Hospital Course   #1 DKA: Patient received IV insulin, IV fluids. Patient placed on 0.19, anion gap 20 on admission. With IV fluids, insulin patient condition improved, anion gap closed but continues to have metabolic acidosis with bicarbonate less than  20. So we continued IV hydration, the patient to regular room. Patient seen by diabetes coordinator, recommended NovoLog 5 units 3 times a day with meals plus correction, Lantus 30 units daily. Discharge home with this medication, advised to continue diabetic diet. #2 severe hypokalemia replace the potassium, potassium was as low as 2.9. Improved with replacement 203.6. Renal function also improved, acidosis is corrected with IV hydration. #3.vulvar  infection with Candida, possible high chest CT: Seen by gynecology, started on Diflucan, Valtrex. Patient has severe vulvar pain, right vulvar swelling, some bleeding because of 5 in because of the right vulvar. Patient was seen by gynecologist and exam was consistent with herpes labialis and also severe Candida infection. . After starting the patient on Valtrex patient showed improvement in swelling, pain and wanted to go home today. Discharge home with 10 day course of Valtrex 1 g by mouth twice a day, Diflucan 150 mg by mouth daily for 3 days, Keflex 500 mg 4 times daily for 7 days. Patient has HSV-2 glycoprotein serology came off that patient is discharged and it is at 1.18. But she is already discharged with Valtrex. Patient did gonorrhea, chlamydia, RPR from vaginal swab  Is negative,HIV non reactive. Urine culture showed strep B agalactiae.      Discharge Condition: stable  Follow UP  Follow-up Information    [facility].        Discharge Instructions  and  Discharge Medications       Medication List    TAKE these medications        cephALEXin 500 MG capsule  Commonly known  as:  KEFLEX  Take 1 capsule (500 mg total) by mouth 4 (four) times daily.     fluconazole 150 MG tablet  Commonly known as:  DIFLUCAN  Take 1 tablet (150 mg total) by mouth daily.     insulin aspart 100 UNIT/ML injection  Commonly known as:  novoLOG  Inject 9 Units into the skin 3 (three) times daily before meals. Patient is  dosed based on a sliding scale.     insulin glargine 100 unit/mL Sopn  Commonly known as:  LANTUS  Inject 0.3 mLs (30 Units total) into the skin at bedtime.     valACYclovir 1000 MG tablet  Commonly known as:  VALTREX  Take 1 tablet (1,000 mg total) by mouth 2 (two) times daily.          Diet and Activity recommendation: See Discharge Instructions above   Consults obtained - GYN   Major procedures and Radiology Reports - PLEASE review detailed and final reports for all details, in brief -      Dg Chest 2 View  06/12/2015  CLINICAL DATA:  Cough 0 1 week EXAM: CHEST  2 VIEW COMPARISON:  None. FINDINGS: The heart size and mediastinal contours are within normal limits. Both lungs are clear. The visualized skeletal structures are unremarkable. IMPRESSION: No active cardiopulmonary disease. Electronically Signed   By: Alcide Clever M.D.   On: 06/12/2015 14:18    Micro Results     Recent Results (from the past 240 hour(s))  Rapid Influenza A&B Antigens ([facility] only)     Status: None   Collection Time: 06/12/15  4:14 PM  Result Value Ref Range Status   Influenza A ([facility]) NEGATIVE NEGATIVE Final   Influenza B ([facility]) NEGATIVE NEGATIVE Final  MRSA PCR Screening     Status: None   Collection Time: 06/12/15  4:54 PM  Result Value Ref Range Status   MRSA by PCR NEGATIVE NEGATIVE Final    Comment:        The GeneXpert MRSA Assay (FDA approved for NASAL specimens only), is one component of a comprehensive MRSA colonization surveillance program. It is not intended to diagnose MRSA infection nor to guide or monitor treatment for MRSA infections.   Urine culture     Status: Abnormal   Collection Time: 06/12/15  4:54 PM  Result Value Ref Range Status   Specimen Description URINE, RANDOM  Final   Special Requests NONE  Final   Culture (A)  Final    >=100,000 COLONIES/mL GROUP B STREP(S.AGALACTIAE)ISOLATED Virtually 100% of S. agalactiae (Group B) strains are susceptible to  Penicillin.  For Penicillin-allergic patients, Erythromycin (85-95% sensitive) and Clindamycin (80% sensitive) are drugs of choice. Contact microbiology lab to request sensitivities if  needed within 7 days.    Report Status 06/13/2015 FINAL  Final  Chlamydia/NGC rt PCR ([facility] only)     Status: None   Collection Time: 06/12/15  4:54 PM  Result Value Ref Range Status   Specimen source GC/Chlam URINE, RANDOM  Final   Chlamydia Tr NOT DETECTED NOT DETECTED Final   N gonorrhoeae NOT DETECTED NOT DETECTED Final    Comment: (NOTE) 100  This methodology has not been evaluated in pregnant women or in 200  patients with a history of hysterectomy. 300 400  This methodology will not be performed on patients less than 28  years of age.        Today   Subjective:   Elliana Wamser today has no headache,no chest abdominal  pain,no new weakness tingling or numbness, feels much better wants to go home today.   Objective:   Blood pressure 137/62, pulse 100, temperature 97.9 F (36.6 C), temperature source Oral, resp. rate 20, height 5\' 8"  (1.727 m), weight 73.9 kg (162 lb 14.7 oz), last menstrual period 06/01/2015, SpO2 96 %.  No intake or output data in the 24 hours ending 06/17/15 1334  Exam Awake Alert, Oriented x 3, No new F.N deficits, Normal affect [place].AT,PERRAL Supple Neck,No JVD, No cervical lymphadenopathy appriciated.  Symmetrical Chest wall movement, Good air movement bilaterally, CTAB RRR,No Gallops,Rubs or new Murmurs, No Parasternal Heave +ve B.Sounds, Abd Soft, Non tender, No organomegaly appriciated, No rebound -guarding or rigidity. No Cyanosis, Clubbing or edema, No new Rash or bruise  Data Review   CBC w Diff:  Lab Results  Component Value Date   WBC 11.6* 06/13/2015   HGB 11.5* 06/13/2015   HCT 33.8* 06/13/2015   PLT 224 06/13/2015   LYMPHOPCT 7% 06/12/2015   MONOPCT 13% 06/12/2015   EOSPCT 0% 06/12/2015   BASOPCT 0% 06/12/2015    CMP:  Lab Results   Component Value Date   NA 137 06/16/2015   K 3.6 06/16/2015   CL 105 06/16/2015   CO2 26 06/16/2015   BUN <5* 06/16/2015   CREATININE <0.30* 06/16/2015   PROT 6.6 06/15/2015   ALBUMIN 3.0* 06/15/2015   BILITOT 0.4 06/15/2015   ALKPHOS 58 06/15/2015   AST 13* 06/15/2015   ALT 9* 06/15/2015  .   Total Time in preparing paper work, data evaluation and todays exam - 35 minutes  Jeremie Abdelaziz M.D on 06/16/2015 at 1:34 PM    Note: This dictation was prepared with Dragon dictation along with smaller phrase technology. Any transcriptional errors that result from this process are unintentional.

## 2015-06-18 LAB — HSV CULTURE AND TYPING

## 2015-06-21 LAB — BLOOD GAS, VENOUS
Acid-base deficit: 16 mmol/L — ABNORMAL HIGH (ref 0.0–2.0)
BICARBONATE: 10.7 meq/L — AB (ref 21.0–28.0)
FIO2: 0.21
PH VEN: 7.19 — AB (ref 7.320–7.430)
Patient temperature: 37
pCO2, Ven: 28 mmHg — ABNORMAL LOW (ref 44.0–60.0)

## 2016-10-04 ENCOUNTER — Emergency Department: Payer: 59

## 2016-10-04 ENCOUNTER — Emergency Department
Admission: EM | Admit: 2016-10-04 | Discharge: 2016-10-04 | Disposition: A | Discharge status: Home / Self Care | Payer: 59 | Attending: Student in an Organized Health Care Education/Training Program | Admitting: Student in an Organized Health Care Education/Training Program

## 2016-10-04 ENCOUNTER — Encounter: Payer: Self-pay | Admitting: Emergency Medicine

## 2016-10-04 DIAGNOSIS — Y998 Other external cause status: Secondary | ICD-10-CM | POA: Insufficient documentation

## 2016-10-04 DIAGNOSIS — E109 Type 1 diabetes mellitus without complications: Secondary | ICD-10-CM | POA: Diagnosis not present

## 2016-10-04 DIAGNOSIS — S060X0A Concussion without loss of consciousness, initial encounter: Secondary | ICD-10-CM | POA: Insufficient documentation

## 2016-10-04 DIAGNOSIS — Y9389 Activity, other specified: Secondary | ICD-10-CM | POA: Insufficient documentation

## 2016-10-04 DIAGNOSIS — G44319 Acute post-traumatic headache, not intractable: Secondary | ICD-10-CM | POA: Insufficient documentation

## 2016-10-04 DIAGNOSIS — Z794 Long term (current) use of insulin: Secondary | ICD-10-CM | POA: Insufficient documentation

## 2016-10-04 DIAGNOSIS — R51 Headache: Secondary | ICD-10-CM | POA: Diagnosis present

## 2016-10-04 DIAGNOSIS — Y929 Unspecified place or not applicable: Secondary | ICD-10-CM | POA: Insufficient documentation

## 2016-10-04 MED ORDER — BUTALBITAL-APAP-CAFFEINE 50-325-40 MG PO TABS
2.0000 | ORAL_TABLET | Freq: Once | ORAL | Status: AC
Start: 2016-10-04 — End: 2016-10-04
  Administered 2016-10-04: 2 via ORAL
  Filled 2016-10-04 (×2): qty 2

## 2016-10-04 MED ORDER — BUTALBITAL-APAP-CAFFEINE 50-325-40 MG PO TABS: 1.0000 | ORAL_TABLET | Freq: Four times a day (QID) | ORAL | 0 refills | Status: DC | PRN

## 2016-10-04 NOTE — ED Notes (Signed)
Patient transported to CT 
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
## 2016-10-04 NOTE — Discharge Instructions (Signed)

## 2017-03-04 DIAGNOSIS — N12 Tubulo-interstitial nephritis, not specified as acute or chronic: Secondary | ICD-10-CM

## 2017-03-04 HISTORY — DX: Tubulo-interstitial nephritis, not specified as acute or chronic: N12

## 2017-03-05 ENCOUNTER — Encounter: Payer: Self-pay | Admitting: Emergency Medicine

## 2017-03-05 ENCOUNTER — Other Ambulatory Visit: Payer: Self-pay

## 2017-03-05 ENCOUNTER — Emergency Department
Admission: EM | Admit: 2017-03-05 | Discharge: 2017-03-05 | Disposition: A | Discharge status: Home / Self Care | Payer: 59 | Attending: Emergency Medicine | Admitting: Emergency Medicine

## 2017-03-05 ENCOUNTER — Emergency Department: Payer: 59

## 2017-03-05 DIAGNOSIS — Z79899 Other long term (current) drug therapy: Secondary | ICD-10-CM | POA: Diagnosis not present

## 2017-03-05 DIAGNOSIS — Z794 Long term (current) use of insulin: Secondary | ICD-10-CM | POA: Diagnosis not present

## 2017-03-05 DIAGNOSIS — J101 Influenza due to other identified influenza virus with other respiratory manifestations: Secondary | ICD-10-CM | POA: Diagnosis not present

## 2017-03-05 DIAGNOSIS — R0602 Shortness of breath: Secondary | ICD-10-CM | POA: Diagnosis not present

## 2017-03-05 DIAGNOSIS — R05 Cough: Secondary | ICD-10-CM | POA: Diagnosis not present

## 2017-03-05 DIAGNOSIS — E109 Type 1 diabetes mellitus without complications: Secondary | ICD-10-CM | POA: Insufficient documentation

## 2017-03-05 LAB — GLUCOSE, CAPILLARY: GLUCOSE-CAPILLARY: 280 mg/dL — AB (ref 65–99)

## 2017-03-05 LAB — INFLUENZA PANEL BY PCR (TYPE A & B)
INFLAPCR: POSITIVE — AB
Influenza B By PCR: NEGATIVE

## 2017-03-05 LAB — POCT PREGNANCY, URINE: Preg Test, Ur: NEGATIVE

## 2017-03-05 MED ORDER — IPRATROPIUM-ALBUTEROL 0.5-2.5 (3) MG/3ML IN SOLN
3.0000 mL | Freq: Once | RESPIRATORY_TRACT | Status: AC
  Administered 2017-03-05: 3 mL via RESPIRATORY_TRACT
  Filled 2017-03-05: qty 3

## 2017-03-05 MED ORDER — ACETAMINOPHEN 500 MG PO TABS
1000.0000 mg | ORAL_TABLET | Freq: Once | ORAL | Status: AC
  Administered 2017-03-05: 1000 mg via ORAL
  Filled 2017-03-05: qty 2

## 2017-03-05 MED ORDER — ALBUTEROL SULFATE HFA 108 (90 BASE) MCG/ACT IN AERS: 2.0000 | INHALATION_SPRAY | Freq: Four times a day (QID) | RESPIRATORY_TRACT | 2 refills | Status: DC | PRN

## 2017-03-05 MED ORDER — OSELTAMIVIR PHOSPHATE 75 MG PO CAPS: 75.0000 mg | ORAL_CAPSULE | Freq: Two times a day (BID) | ORAL | 0 refills | Status: DC

## 2017-03-05 NOTE — ED Provider Notes (Signed)
[facility] Emergency Department Provider Note  ____________________________________________   First MD Initiated Contact with Patient 03/05/17 0845     (approximate)  I have reviewed the triage vital signs and the nursing notes.   HISTORY  Chief Complaint Cough and Sore Throat    HPI Mandy Patel is a 22 y.o. female complains of cough, congestion, sore throat, states she just does not feel well.  She states she does ache a little bit but not really bad.  She does not know if she had a fever.  She took some over-the-counter cold medicines.  These did not help.  She states every time she goes to do something her heart rate increases.  She denies any vomiting or diarrhea  Past Medical History:  Diagnosis Date  . Diabetes mellitus    type I, diagnosed age 64yo    Patient Active Problem List   Diagnosis Date Noted  . DKA (diabetic ketoacidoses) (HCC) 06/12/2015  . Type I (juvenile type) diabetes mellitus without mention of complication, uncontrolled 04/26/2011  . Preventative health care 12/01/2010  . Obesity 06/19/2010  . Goiter, unspecified 06/19/2010  . HYPERCHOLESTEROLEMIA 04/25/2008    Past Surgical History:  Procedure Laterality Date  . EYE MUSCLE SURGERY  approx 2010   bilat eye surgury, left exotropia worse;Dr Maple Hudson, opthomology    Prior to Admission medications   Medication Sig Start Date End Date Taking? Authorizing Provider  butalbital-acetaminophen-caffeine (FIORICET, ESGIC) 50-325-40 MG tablet Take 1-2 tablets by mouth every 6 (six) hours as needed for headache. 10/04/16 10/04/17  Willy Eddy, MD  cephALEXin (KEFLEX) 500 MG capsule Take 1 capsule (500 mg total) by mouth 4 (four) times daily. 06/15/15   Katha Hamming, MD  fluconazole (DIFLUCAN) 150 MG tablet Take 1 tablet (150 mg total) by mouth daily. 06/15/15   Katha Hamming, MD  insulin aspart (NOVOLOG) 100 UNIT/ML injection Inject 9 Units into the skin 3 (three)  times daily before meals. Patient is dosed based on a sliding scale. 06/16/15   Katha Hamming, MD  insulin glargine (LANTUS) 100 unit/mL SOPN Inject 0.3 mLs (30 Units total) into the skin at bedtime. 06/16/15   Katha Hamming, MD  oseltamivir (TAMIFLU) 75 MG capsule Take 1 capsule (75 mg total) by mouth 2 (two) times daily. 03/05/17   Kiptyn Rafuse, Roselyn Bering, PA-C  valACYclovir (VALTREX) 1000 MG tablet Take 1 tablet (1,000 mg total) by mouth 2 (two) times daily. 06/16/15   Katha Hamming, MD    Allergies Patient has no known allergies.  Family History  Problem Relation Age of Onset  . Alcohol abuse Other   . Arthritis Other   . Hyperlipidemia Other   . Stroke Other   . Diabetes Other   . Hyperlipidemia Other   . Hypertension Other   . Diabetes Other     Social History Social History   Tobacco Use  . Smoking status: Never Smoker  Substance Use Topics  . Alcohol use: No  . Drug use: No    Review of Systems  Constitutional:  unsure of fever/chills Eyes: No visual changes. ENT: No sore throat. Respiratory: Positive cough and some shortness of breath Gastrointestinal: Denies vomiting or diarrhea Genitourinary: Negative for dysuria. Musculoskeletal: Negative for back pain. Skin: Negative for rash.    ____________________________________________   PHYSICAL EXAM:  VITAL SIGNS: ED Triage Vitals  Enc Vitals Group     BP 03/05/17 0818 110/64     Pulse Rate 03/05/17 0818 (!) 128  Resp 03/05/17 0818 20     Temp 03/05/17 0818 (!) 97.5 F (36.4 C)     Temp Source 03/05/17 0818 Oral     SpO2 03/05/17 0818 100 %     Weight 03/05/17 0819 165 lb (74.8 kg)     Height 03/05/17 0819 5\' 8"  (1.727 m)     Head Circumference --      Peak Flow --      Pain Score 03/05/17 0819 7     Pain Loc --      Pain Edu? --      Excl. in GC? --     Constitutional: Alert and oriented. Well appearing and in no acute distress. Eyes: Conjunctivae are normal.  Head: Atraumatic.     Ears: TMs are clear bilaterally Nose: No congestion/rhinnorhea. Mouth/Throat: Mucous membranes are moist.   Cardiovascular: Tachycardic, regular rhythm.  Heart sounds are normal  respiratory: Normal respiratory effort.  No retractions, decreased breath sounds in the left lower lung GU: deferred Musculoskeletal: FROM all extremities, warm and well perfused Neurologic:  Normal speech and language.  Skin:  Skin is warm, dry and intact. No rash noted. Psychiatric: Mood and affect are normal. Speech and behavior are normal.  ____________________________________________   LABS (all labs ordered are listed, but only abnormal results are displayed)  Labs Reviewed  INFLUENZA PANEL BY PCR (TYPE A & B) - Abnormal; Notable for the following components:      Result Value   Influenza A By PCR POSITIVE (*)    All other components within normal limits  GLUCOSE, CAPILLARY - Abnormal; Notable for the following components:   Glucose-Capillary 280 (*)    All other components within normal limits  POC URINE PREG, ED  POCT PREGNANCY, URINE  CBG MONITORING, ED   ____________________________________________   ____________________________________________  RADIOLOGY  Chest x-ray is negative  ____________________________________________   PROCEDURES  Procedure(s) performed: No  Procedures    ____________________________________________   INITIAL IMPRESSION / ASSESSMENT AND PLAN / ED COURSE  Pertinent labs & imaging results that were available during my care of the patient were reviewed by me and considered in my medical decision making (see chart for details).  Patient is a 22 year old female complaining of a cough for 3 days.  Some sore throat.  She states every time she goes to walk her heart rate increases.  She is unsure of a fever as she has been taking over-the-counter cold medicine  On physical exam patient is tachycardic.  Her lungs have decreased air movement in the left  lower lung.  The remainder of the exam is benign  Patient has a history of DKA.  Point-of-care glucose test was ordered.  ----------------------------------------- 10:27 AM on 03/05/2017 -----------------------------------------  Chest x-ray is negative, influenza test is positive for influenza A.  After the first DuoNeb treatment the patient has wheezing.  Second DuoNeb treatment is ordered prior to discharge.  Had a long discussion with family about encouraging fluids.  In the should be non-caffeinated fluids.  They are to closely watch her glucose levels.  If they are worsening and they are concerned of DKA they should return to emergency department promptly.  She is given a prescription for Tamiflu 75 mg twice daily.  An inhaler albuterol.  A work note.  She is to return to the emergency room if she is worsening.  She is to see her regular doctor in 3-5 days if not better.  Patient was discharged in stable condition   As  part of my medical decision making, I reviewed the following data within the electronic MEDICAL RECORD NUMBER History obtained from family, Nursing notes reviewed and incorporated, Labs reviewed influenza positive for a, Old chart reviewed, Radiograph reviewed negative for pneumonia, Notes from prior ED visits  ____________________________________________   FINAL CLINICAL IMPRESSION(S) / ED DIAGNOSES  Final diagnoses:  Influenza A      NEW MEDICATIONS STARTED DURING THIS VISIT:  New Prescriptions   OSELTAMIVIR (TAMIFLU) 75 MG CAPSULE    Take 1 capsule (75 mg total) by mouth 2 (two) times daily.     Note:  This document was prepared using Dragon voice recognition software and may include unintentional dictation errors.    Faythe GheeFisher, [place] Fritze W, PA-C 03/05/17 1030    Jeanmarie PlantMcShane, James A, MD 03/05/17 [phone]

## 2017-03-05 NOTE — ED Notes (Signed)
Patient transported to X-ray 

## 2017-03-05 NOTE — ED Triage Notes (Signed)
Cough x 3 days. Sore throat. Denies body aches.

## 2017-03-05 NOTE — Discharge Instructions (Signed)
Follow-up with your regular doctor if you are not improving in 3-5 days.  If you are worsening please return to the emergency department.  Use medication as prescribed.  Alternate Tylenol and ibuprofen to keep your fever decreased.  Please drink plenty of fluids.

## 2017-03-06 ENCOUNTER — Observation Stay (HOSPITAL_COMMUNITY)
Admission: EM | Admit: 2017-03-06 | Discharge: 2017-03-07 | Disposition: A | Discharge status: Home / Self Care | Payer: 59 | Attending: Internal Medicine | Admitting: Internal Medicine

## 2017-03-06 ENCOUNTER — Other Ambulatory Visit: Payer: Self-pay

## 2017-03-06 ENCOUNTER — Encounter (HOSPITAL_COMMUNITY): Payer: Self-pay

## 2017-03-06 ENCOUNTER — Emergency Department (HOSPITAL_COMMUNITY): Payer: 59

## 2017-03-06 DIAGNOSIS — J101 Influenza due to other identified influenza virus with other respiratory manifestations: Secondary | ICD-10-CM

## 2017-03-06 DIAGNOSIS — E860 Dehydration: Secondary | ICD-10-CM | POA: Insufficient documentation

## 2017-03-06 DIAGNOSIS — Z79899 Other long term (current) drug therapy: Secondary | ICD-10-CM | POA: Diagnosis not present

## 2017-03-06 DIAGNOSIS — Z91010 Allergy to peanuts: Secondary | ICD-10-CM | POA: Insufficient documentation

## 2017-03-06 DIAGNOSIS — R197 Diarrhea, unspecified: Secondary | ICD-10-CM

## 2017-03-06 DIAGNOSIS — Z9114 Patient's other noncompliance with medication regimen: Secondary | ICD-10-CM | POA: Insufficient documentation

## 2017-03-06 DIAGNOSIS — R1110 Vomiting, unspecified: Secondary | ICD-10-CM

## 2017-03-06 DIAGNOSIS — R05 Cough: Secondary | ICD-10-CM | POA: Diagnosis not present

## 2017-03-06 DIAGNOSIS — E7800 Pure hypercholesterolemia, unspecified: Secondary | ICD-10-CM | POA: Diagnosis not present

## 2017-03-06 DIAGNOSIS — R112 Nausea with vomiting, unspecified: Secondary | ICD-10-CM

## 2017-03-06 DIAGNOSIS — E1110 Type 2 diabetes mellitus with ketoacidosis without coma: Secondary | ICD-10-CM | POA: Diagnosis present

## 2017-03-06 DIAGNOSIS — I493 Ventricular premature depolarization: Secondary | ICD-10-CM | POA: Insufficient documentation

## 2017-03-06 DIAGNOSIS — Z794 Long term (current) use of insulin: Secondary | ICD-10-CM | POA: Diagnosis not present

## 2017-03-06 DIAGNOSIS — E1010 Type 1 diabetes mellitus with ketoacidosis without coma: Principal | ICD-10-CM | POA: Insufficient documentation

## 2017-03-06 DIAGNOSIS — R000 Tachycardia, unspecified: Secondary | ICD-10-CM | POA: Diagnosis not present

## 2017-03-06 DIAGNOSIS — J111 Influenza due to unidentified influenza virus with other respiratory manifestations: Secondary | ICD-10-CM | POA: Diagnosis not present

## 2017-03-06 LAB — BASIC METABOLIC PANEL
ANION GAP: 18 — AB (ref 5–15)
ANION GAP: 8 (ref 5–15)
BUN: 15 mg/dL (ref 6–20)
BUN: 9 mg/dL (ref 6–20)
CALCIUM: 8.6 mg/dL — AB (ref 8.9–10.3)
CALCIUM: 7.8 mg/dL — AB (ref 8.9–10.3)
CO2: 9 mmol/L — ABNORMAL LOW (ref 22–32)
CO2: 14 mmol/L — ABNORMAL LOW (ref 22–32)
Chloride: 107 mmol/L (ref 101–111)
Chloride: 113 mmol/L — ABNORMAL HIGH (ref 101–111)
Creatinine, Ser: 1.06 mg/dL — ABNORMAL HIGH (ref 0.44–1.00)
Creatinine, Ser: 0.64 mg/dL (ref 0.44–1.00)
GFR calc Af Amer: >60 mL/min (ref >60)
Glucose, Bld: 302 mg/dL — ABNORMAL HIGH (ref 65–99)
Glucose, Bld: 131 mg/dL — ABNORMAL HIGH (ref 65–99)
Potassium: 4 mmol/L (ref 3.5–5.1)
Potassium: 3.6 mmol/L (ref 3.5–5.1)
Sodium: 134 mmol/L — ABNORMAL LOW (ref 135–145)
Sodium: 135 mmol/L (ref 135–145)

## 2017-03-06 LAB — CBC
HCT: 42.9 % (ref 36.0–46.0)
HEMOGLOBIN: 14.6 g/dL (ref 12.0–15.0)
MCH: 31.7 pg (ref 26.0–34.0)
MCHC: 34 g/dL (ref 30.0–36.0)
MCV: 93.3 fL (ref 78.0–100.0)
Platelets: 276 10*3/uL (ref 150–400)
RBC: 4.6 MIL/uL (ref 3.87–5.11)
RDW: 13 % (ref 11.5–15.5)
WBC: 12.9 10*3/uL — ABNORMAL HIGH (ref 4.0–10.5)

## 2017-03-06 LAB — I-STAT CHEM 8, ED
BUN: 30 mg/dL — ABNORMAL HIGH (ref 6–20)
BUN: 20 mg/dL (ref 6–20)
CALCIUM ION: 1.11 mmol/L — AB (ref 1.15–1.40)
CHLORIDE: 107 mmol/L (ref 101–111)
CREATININE: 0.6 mg/dL (ref 0.44–1.00)
Calcium, Ion: 1.11 mmol/L — ABNORMAL LOW (ref 1.15–1.40)
Chloride: 109 mmol/L (ref 101–111)
Creatinine, Ser: 0.6 mg/dL (ref 0.44–1.00)
Glucose, Bld: 480 mg/dL — ABNORMAL HIGH (ref 65–99)
Glucose, Bld: 479 mg/dL — ABNORMAL HIGH (ref 65–99)
HCT: 51 % — ABNORMAL HIGH (ref 36.0–46.0)
HCT: 47 % — ABNORMAL HIGH (ref 36.0–46.0)
HEMOGLOBIN: 17.3 g/dL — AB (ref 12.0–15.0)
Hemoglobin: 16 g/dL — ABNORMAL HIGH (ref 12.0–15.0)
POTASSIUM: 8 mmol/L — AB (ref 3.5–5.1)
Potassium: 4.8 mmol/L (ref 3.5–5.1)
SODIUM: 132 mmol/L — AB (ref 135–145)
SODIUM: 136 mmol/L (ref 135–145)
TCO2: 11 mmol/L — ABNORMAL LOW (ref 22–32)
TCO2: 9 mmol/L — AB (ref 22–32)

## 2017-03-06 LAB — COMPREHENSIVE METABOLIC PANEL
ALK PHOS: 82 U/L (ref 38–126)
ALT: 13 U/L — AB (ref 14–54)
AST: 12 U/L — ABNORMAL LOW (ref 15–41)
Albumin: 4.2 g/dL (ref 3.5–5.0)
BILIRUBIN TOTAL: 1.4 mg/dL — AB (ref 0.3–1.2)
BUN: 18 mg/dL (ref 6–20)
CALCIUM: 9.3 mg/dL (ref 8.9–10.3)
CREATININE: 1.22 mg/dL — AB (ref 0.44–1.00)
Chloride: 104 mmol/L (ref 101–111)
Glucose, Bld: 460 mg/dL — ABNORMAL HIGH (ref 65–99)
Potassium: 5 mmol/L (ref 3.5–5.1)
SODIUM: 135 mmol/L (ref 135–145)
Total Protein: 8.8 g/dL — ABNORMAL HIGH (ref 6.5–8.1)

## 2017-03-06 LAB — CBC WITH DIFFERENTIAL/PLATELET
Basophils Absolute: 0 10*3/uL (ref 0.0–0.1)
Basophils Relative: 0 %
Eosinophils Absolute: 0 10*3/uL (ref 0.0–0.7)
Eosinophils Relative: 0 %
HEMATOCRIT: 47.1 % — AB (ref 36.0–46.0)
HEMOGLOBIN: 16.3 g/dL — AB (ref 12.0–15.0)
LYMPHS ABS: 1.8 10*3/uL (ref 0.7–4.0)
LYMPHS PCT: 16 %
MCH: 32.1 pg (ref 26.0–34.0)
MCHC: 34.6 g/dL (ref 30.0–36.0)
MCV: 92.9 fL (ref 78.0–100.0)
Monocytes Absolute: 1.1 10*3/uL — ABNORMAL HIGH (ref 0.1–1.0)
Monocytes Relative: 10 %
NEUTROS ABS: 8.5 10*3/uL — AB (ref 1.7–7.7)
Neutrophils Relative %: 74 %
Platelets: 237 10*3/uL (ref 150–400)
RBC: 5.07 MIL/uL (ref 3.87–5.11)
RDW: 13.3 % (ref 11.5–15.5)
WBC: 11.5 10*3/uL — AB (ref 4.0–10.5)

## 2017-03-06 LAB — GLUCOSE, CAPILLARY
GLUCOSE-CAPILLARY: 118 mg/dL — AB (ref 65–99)
GLUCOSE-CAPILLARY: 131 mg/dL — AB (ref 65–99)
GLUCOSE-CAPILLARY: 136 mg/dL — AB (ref 65–99)
GLUCOSE-CAPILLARY: 160 mg/dL — AB (ref 65–99)
GLUCOSE-CAPILLARY: 183 mg/dL — AB (ref 65–99)
GLUCOSE-CAPILLARY: 214 mg/dL — AB (ref 65–99)
GLUCOSE-CAPILLARY: 292 mg/dL — AB (ref 65–99)
Glucose-Capillary: 139 mg/dL — ABNORMAL HIGH (ref 65–99)
Glucose-Capillary: 115 mg/dL — ABNORMAL HIGH (ref 65–99)

## 2017-03-06 LAB — I-STAT BETA HCG BLOOD, ED (MC, WL, AP ONLY)

## 2017-03-06 LAB — CBG MONITORING, ED
GLUCOSE-CAPILLARY: 314 mg/dL — AB (ref 65–99)
Glucose-Capillary: 420 mg/dL — ABNORMAL HIGH (ref 65–99)

## 2017-03-06 LAB — I-STAT ARTERIAL BLOOD GAS, ED
Acid-base deficit: 18 mmol/L — ABNORMAL HIGH (ref 0.0–2.0)
BICARBONATE: 9 mmol/L — AB (ref 20.0–28.0)
O2 SAT: 45 %
PO2 ART: 31 mmHg — AB (ref 83.0–108.0)
TCO2: 10 mmol/L — AB (ref 22–32)
pCO2 arterial: 24.7 mmHg — ABNORMAL LOW (ref 32.0–48.0)
pH, Arterial: 7.167 — CL (ref 7.350–7.450)

## 2017-03-06 LAB — LIPASE, BLOOD: Lipase: 20 U/L (ref 11–51)

## 2017-03-06 LAB — MRSA PCR SCREENING: MRSA by PCR: NEGATIVE

## 2017-03-06 LAB — BETA-HYDROXYBUTYRIC ACID: BETA-HYDROXYBUTYRIC ACID: 5.63 mmol/L — AB (ref 0.05–0.27)

## 2017-03-06 MED ORDER — ENOXAPARIN SODIUM 40 MG/0.4ML ~~LOC~~ SOLN
40.0000 mg | SUBCUTANEOUS | Status: DC
  Administered 2017-03-07: 40 mg via SUBCUTANEOUS
  Filled 2017-03-06: qty 0.4

## 2017-03-06 MED ORDER — SODIUM CHLORIDE 0.9% FLUSH: 3.0000 mL | Freq: Two times a day (BID) | INTRAVENOUS | Status: DC

## 2017-03-06 MED ORDER — OSELTAMIVIR PHOSPHATE 75 MG PO CAPS
75.0000 mg | ORAL_CAPSULE | Freq: Two times a day (BID) | ORAL | Status: DC
  Administered 2017-03-06 – 2017-03-07 (×2): 75 mg via ORAL
  Filled 2017-03-06 (×2): qty 1

## 2017-03-06 MED ORDER — ALBUTEROL SULFATE (2.5 MG/3ML) 0.083% IN NEBU
2.5000 mg | INHALATION_SOLUTION | RESPIRATORY_TRACT | Status: DC | PRN
Start: 2017-03-06 — End: 2017-03-07

## 2017-03-06 MED ORDER — SODIUM CHLORIDE 0.9 % IV SOLN
INTRAVENOUS | Status: DC
  Filled 2017-03-06: qty 1

## 2017-03-06 MED ORDER — INSULIN ASPART 100 UNIT/ML ~~LOC~~ SOLN
0.0000 [IU] | SUBCUTANEOUS | Status: DC
  Administered 2017-03-06: 2 [IU] via SUBCUTANEOUS
  Administered 2017-03-07: 5 [IU] via SUBCUTANEOUS

## 2017-03-06 MED ORDER — MORPHINE SULFATE (PF) 4 MG/ML IV SOLN
4.0000 mg | Freq: Once | INTRAVENOUS | Status: AC
  Administered 2017-03-06: 4 mg via INTRAVENOUS
  Filled 2017-03-06: qty 1

## 2017-03-06 MED ORDER — POTASSIUM CHLORIDE CRYS ER 20 MEQ PO TBCR: 40.0000 meq | EXTENDED_RELEASE_TABLET | Freq: Once | ORAL | Status: DC

## 2017-03-06 MED ORDER — DEXTROSE-NACL 5-0.45 % IV SOLN: INTRAVENOUS | Status: DC

## 2017-03-06 MED ORDER — INSULIN GLARGINE 100 UNIT/ML ~~LOC~~ SOLN
22.0000 [IU] | Freq: Every day | SUBCUTANEOUS | Status: DC
  Administered 2017-03-06: 22 [IU] via SUBCUTANEOUS
  Filled 2017-03-06: qty 0.22

## 2017-03-06 MED ORDER — SODIUM CHLORIDE 0.9 % IV SOLN: INTRAVENOUS | Status: DC

## 2017-03-06 MED ORDER — ONDANSETRON HCL 4 MG/2ML IJ SOLN
4.0000 mg | Freq: Once | INTRAMUSCULAR | Status: AC
  Administered 2017-03-06: 4 mg via INTRAVENOUS
  Filled 2017-03-06: qty 2

## 2017-03-06 MED ORDER — SODIUM CHLORIDE 0.9% FLUSH: 3.0000 mL | INTRAVENOUS | Status: DC | PRN

## 2017-03-06 MED ORDER — POTASSIUM CHLORIDE 10 MEQ/100ML IV SOLN: 10.0000 meq | INTRAVENOUS | Status: AC

## 2017-03-06 MED ORDER — IBUPROFEN 200 MG PO TABS
400.0000 mg | ORAL_TABLET | Freq: Three times a day (TID) | ORAL | Status: DC
  Administered 2017-03-06 – 2017-03-07 (×3): 400 mg via ORAL
  Filled 2017-03-06 (×3): qty 2

## 2017-03-06 MED ORDER — DEXTROSE-NACL 5-0.45 % IV SOLN
INTRAVENOUS | Status: DC
  Administered 2017-03-06: 15:00:00 via INTRAVENOUS

## 2017-03-06 MED ORDER — SODIUM CHLORIDE 0.9 % IV SOLN: 250.0000 mL | INTRAVENOUS | Status: DC | PRN

## 2017-03-06 MED ORDER — SODIUM CHLORIDE 0.9 % IV BOLUS (SEPSIS)
1000.0000 mL | Freq: Once | INTRAVENOUS | Status: AC
  Administered 2017-03-06: 1000 mL via INTRAVENOUS

## 2017-03-06 MED ORDER — FAMOTIDINE IN NACL 20-0.9 MG/50ML-% IV SOLN
20.0000 mg | Freq: Once | INTRAVENOUS | Status: AC
  Administered 2017-03-06: 20 mg via INTRAVENOUS
  Filled 2017-03-06: qty 50

## 2017-03-06 MED ORDER — ACETAMINOPHEN 325 MG PO TABS
650.0000 mg | ORAL_TABLET | Freq: Four times a day (QID) | ORAL | Status: DC | PRN
  Administered 2017-03-06: 650 mg via ORAL
  Filled 2017-03-06: qty 2

## 2017-03-06 MED ORDER — SODIUM CHLORIDE 0.9 % IV SOLN
INTRAVENOUS | Status: DC
  Administered 2017-03-06: 2.5 [IU]/h via INTRAVENOUS
  Filled 2017-03-06: qty 1

## 2017-03-06 MED ORDER — SODIUM CHLORIDE 0.9 % IV BOLUS (SEPSIS)
2000.0000 mL | Freq: Once | INTRAVENOUS | Status: AC
  Administered 2017-03-06: 2000 mL via INTRAVENOUS

## 2017-03-06 MED ORDER — INSULIN ASPART 100 UNIT/ML ~~LOC~~ SOLN: 0.0000 [IU] | Freq: Three times a day (TID) | SUBCUTANEOUS | Status: DC

## 2017-03-06 MED ORDER — SODIUM CHLORIDE 0.9 % IV SOLN
INTRAVENOUS | Status: DC
  Administered 2017-03-06: 12:00:00 via INTRAVENOUS

## 2017-03-06 NOTE — ED Provider Notes (Signed)
[name] EMERGENCY DEPARTMENT Provider Note   CSN: [id number] Arrival date & time: 03/06/17  0756     History   Chief Complaint No chief complaint on file.   HPI Mandy Patel is a 22 y.o. female.  HPI Patient presents emergency room for evaluation of nausea and vomiting.  Patient states she has had URI, cough symptoms for the several days this past week.  She actually went to [facility] yesterday.  Patient was tested for flu and she was positive for influenza A.  Patient was discharged home with a prescription for Tamiflu.  Patient states since starting the Tamiflu she started developing nausea and vomiting.  She denies any abdominal pain but feels constantly nauseated.  Patient's blood sugars have also increased.  She took her insulin yesterday but admits she is not always on time with her insulin dosing.  Since last night she has had multiple episodes of vomiting and cannot keep anything down.  She feels dehydrated and weak.  Patient had DKA in the past but it was when she was in middle school. Past Medical History:  Diagnosis Date  . Diabetes mellitus    type I, diagnosed age 50yo    Patient Active Problem List   Diagnosis Date Noted  . DKA (diabetic ketoacidoses) (HCC) 06/12/2015  . Type I (juvenile type) diabetes mellitus without mention of complication, uncontrolled 04/26/2011  . Preventative health care 12/01/2010  . Obesity 06/19/2010  . Goiter, unspecified 06/19/2010  . HYPERCHOLESTEROLEMIA 04/25/2008    Past Surgical History:  Procedure Laterality Date  . EYE MUSCLE SURGERY  approx 2010   bilat eye surgury, left exotropia worse;Dr Maple Hudson, opthomology    OB History    No data available       Home Medications    Prior to Admission medications   Medication Sig Start Date End Date Taking? Authorizing Provider  albuterol (PROVENTIL HFA;VENTOLIN HFA) 108 (90 Base) MCG/ACT inhaler Inhale 2 puffs into the lungs every 6 (six)  hours as needed for wheezing or shortness of breath. 03/05/17   Fisher, Roselyn Bering, PA-C  butalbital-acetaminophen-caffeine (FIORICET, ESGIC) [phone] MG tablet Take 1-2 tablets by mouth every 6 (six) hours as needed for headache. 10/04/16 10/04/17  Willy Eddy, MD  cephALEXin (KEFLEX) 500 MG capsule Take 1 capsule (500 mg total) by mouth 4 (four) times daily. 06/15/15   Katha Hamming, MD  fluconazole (DIFLUCAN) 150 MG tablet Take 1 tablet (150 mg total) by mouth daily. 06/15/15   Katha Hamming, MD  insulin aspart (NOVOLOG) 100 UNIT/ML injection Inject 9 Units into the skin 3 (three) times daily before meals. Patient is dosed based on a sliding scale. 06/16/15   Katha Hamming, MD  insulin glargine (LANTUS) 100 unit/mL SOPN Inject 0.3 mLs (30 Units total) into the skin at bedtime. 06/16/15   Katha Hamming, MD  oseltamivir (TAMIFLU) 75 MG capsule Take 1 capsule (75 mg total) by mouth 2 (two) times daily. 03/05/17   Fisher, Roselyn Bering, PA-C  valACYclovir (VALTREX) 1000 MG tablet Take 1 tablet (1,000 mg total) by mouth 2 (two) times daily. 06/16/15   Katha Hamming, MD    Family History Family History  Problem Relation Age of Onset  . Alcohol abuse Other   . Arthritis Other   . Hyperlipidemia Other   . Stroke Other   . Diabetes Other   . Hyperlipidemia Other   . Hypertension Other   . Diabetes Other     Social History Social History  Tobacco Use  . Smoking status: Never Smoker  . Smokeless tobacco: Never Used  Substance Use Topics  . Alcohol use: No  . Drug use: No     Allergies   Patient has no known allergies.   Review of Systems Review of Systems  All other systems reviewed and are negative.    Physical Exam Updated Vital Signs BP (!) 145/92   Pulse (!) 125   Temp 97.7 F (36.5 C) (Oral)   Resp 20   Ht 1.727 m (5\' 8" )   Wt 72.6 kg (160 lb)   LMP 03/05/2017   SpO2 99%   BMI 24.33 kg/m   Physical Exam  Constitutional: She appears  well-developed and well-nourished. No distress.  HENT:  Head: Normocephalic and atraumatic.  Right Ear: External ear normal.  Left Ear: External ear normal.  Mouth/Throat: Mucous membranes are dry. No oropharyngeal exudate or posterior oropharyngeal edema.  Eyes: Conjunctivae are normal. Right eye exhibits no discharge. Left eye exhibits no discharge. No scleral icterus.  Neck: Neck supple. No tracheal deviation present.  Cardiovascular: Normal rate, regular rhythm and intact distal pulses.  Pulmonary/Chest: Effort normal and breath sounds normal. No stridor. No respiratory distress. She has no wheezes. She has no rales.  Abdominal: Soft. Bowel sounds are normal. She exhibits no distension. There is no hepatosplenomegaly. There is no tenderness. There is no rebound and no guarding. No hernia.  Musculoskeletal: She exhibits no edema or tenderness.  Neurological: She is alert. She has normal strength. No cranial nerve deficit (no facial droop, extraocular movements intact, no slurred speech) or sensory deficit. She exhibits normal muscle tone. She displays no seizure activity. Coordination normal.  Skin: Skin is warm and dry. No rash noted.  Psychiatric: She has a normal mood and affect.  Nursing note and vitals reviewed.    ED Treatments / Results  Labs (all labs ordered are listed, but only abnormal results are displayed) Labs Reviewed  CBG MONITORING, ED - Abnormal; Notable for the following components:      Result Value   Glucose-Capillary 420 (*)    All other components within normal limits  I-STAT CHEM 8, ED - Abnormal; Notable for the following components:   Sodium 132 (*)    Potassium 8.0 (*)    BUN 30 (*)    Glucose, Bld 480 (*)    Calcium, Ion 1.11 (*)    TCO2 11 (*)    Hemoglobin 17.3 (*)    HCT 51.0 (*)    All other components within normal limits  I-STAT ARTERIAL BLOOD GAS, ED - Abnormal; Notable for the following components:   pH, Arterial 7.167 (*)    pCO2 arterial  24.7 (*)    pO2, Arterial 31.0 (*)    Bicarbonate 9.0 (*)    TCO2 10 (*)    Acid-base deficit 18.0 (*)    All other components within normal limits  I-STAT CHEM 8, ED - Abnormal; Notable for the following components:   Glucose, Bld 479 (*)    Calcium, Ion 1.11 (*)    TCO2 9 (*)    Hemoglobin 16.0 (*)    HCT 47.0 (*)    All other components within normal limits  URINALYSIS, ROUTINE W REFLEX MICROSCOPIC  CBC WITH DIFFERENTIAL/PLATELET  COMPREHENSIVE METABOLIC PANEL  LIPASE, BLOOD  I-STAT BETA HCG BLOOD, ED ([facility], [facility], [facility] ONLY)  I-STAT VENOUS BLOOD GAS, ED    EKG  EKG Interpretation  Date/Time:  Sunday March 06 2017 09:35:04 EST Ventricular  Rate:  133 PR Interval:    QRS Duration: 78 QT Interval:  315 QTC Calculation: 469 R Axis:   89 Text Interpretation:  Sinus tachycardia Ventricular premature complex Nonspecific T abnormalities, inferior leads No significant change since last tracing Confirmed by Linwood Dibbles [phone]) on 03/06/2017 9:38:15 AM       Radiology Dg Chest 2 View  Result Date: 03/05/2017 CLINICAL DATA:  Shortness of breath, productive cough EXAM: CHEST  2 VIEW COMPARISON:  06/12/2015 FINDINGS: The heart size and mediastinal contours are within normal limits. Both lungs are clear. The visualized skeletal structures are unremarkable. IMPRESSION: No active cardiopulmonary disease. Electronically Signed   By: Elige Ko   On: 03/05/2017 09:32   Dg Chest Portable 1 View  Result Date: 03/06/2017 CLINICAL DATA:  Cough, vomiting EXAM: PORTABLE CHEST 1 VIEW COMPARISON:  03/05/2017 FINDINGS: Lungs are clear.  No pleural effusion or pneumothorax. The heart is normal in size. Visualized osseous structures are within normal limits. IMPRESSION: Normal chest radiographs. Electronically Signed   By: Charline Bills M.D.   On: 03/06/2017 08:49    Procedures .Critical Care Performed by: Linwood Dibbles, MD Authorized by: Linwood Dibbles, MD   Critical care provider statement:     Critical care time (minutes):  45   Critical care was necessary to treat or prevent imminent or life-threatening deterioration of the following conditions:  Endocrine crisis   Critical care was time spent personally by me on the following activities:  Discussions with consultants, evaluation of patient's response to treatment, examination of patient, ordering and performing treatments and interventions, ordering and review of laboratory studies, ordering and review of radiographic studies, pulse oximetry, re-evaluation of patient's condition, obtaining history from patient or surrogate and review of old charts   (including critical care time)  Medications Ordered in ED Medications  sodium chloride 0.9 % bolus 1,000 mL (1,000 mLs Intravenous New Bag/Given 03/06/17 0856)    And  sodium chloride 0.9 % bolus 1,000 mL (not administered)    And  0.9 %  sodium chloride infusion (not administered)  dextrose 5 %-0.45 % sodium chloride infusion (not administered)  insulin regular (NOVOLIN R,HUMULIN R) 100 Units in sodium chloride 0.9 % 100 mL (1 Units/mL) infusion (not administered)  famotidine (PEPCID) IVPB 20 mg premix (not administered)  morphine 4 MG/ML injection 4 mg (not administered)  potassium chloride SA (K-DUR,KLOR-CON) CR tablet 40 mEq (not administered)  ondansetron (ZOFRAN) injection 4 mg (4 mg Intravenous Given 03/06/17 0853)     Initial Impression / Assessment and Plan / ED Course  I have reviewed the triage vital signs and the nursing notes.  Pertinent labs & imaging results that were available during my care of the patient were reviewed by me and considered in my medical decision making (see chart for details).  Clinical Course as of Mar 06 940  Wynelle Link Mar 06, 2017  0825 Patient's symptoms are concerning for DKA.  She does have an elevated blood sugar.  We will proceed with laboratory testing and further evaluation  [JK]  0930 K is very elevated.  Question whether it is hemolyzed.   Will start an insulin drip at this point for her DKA which should help with the potassium if it is true.  Will check EKG to make sure she does not have any EKG changes  [JK]  0936 Repeat istat shows the potassium is at 4.8, not 8.  Previous specimen was hemolyzed  [JK]  0937 CXR without pna  [JK]  Clinical Course User Index [JK] Linwood Dibbles, MD    Patient presented to the emergency room with complaints of nausea vomiting after recent influenza diagnosis.  Patient has a history of diabetes and her blood sugars have been running high.  Her laboratory tests are consistent with diabetic ketoacidosis.  Patient has been started on IV fluids and insulin.  Initially potassium repletion was withheld because she had an elevated serum potassium.  Repeat potassium however is normal.  The first sample was likely hemolyzed.  Plan on consultation with medical service for admission to the stepdown unit.  Findings and plan were discussed with the patient and her mother.  Final Clinical Impressions(s) / ED Diagnoses   Final diagnoses:  Diabetic ketoacidosis without coma associated with type 1 diabetes mellitus (HCC)  Influenza      Linwood Dibbles, MD 03/06/17 [phone]

## 2017-03-06 NOTE — ED Triage Notes (Addendum)
[facility] and diagnosed with flu and started tamiflu. During the night has had multiple episodes of vomiting. BS during the night high 400s. Arrived tachypneic and tachycardic. Pale and lips dry

## 2017-03-06 NOTE — ED Notes (Signed)
Per RN, pt was given ice chips.

## 2017-03-06 NOTE — H&P (Signed)
History and Physical:    Mandy Patel   WUJ:[id number] DOB: [birth year] DOA: 03/06/2017  Referring MD/provider: Dr. Lynelle Doctor PCP: Corwin Levins, MD   Patient coming from: Home  Chief Complaint: Influenza, nausea vomiting and hyperglycemia  History of Present Illness:   Mandy Patel is an 22 y.o. female with past medical history significant for type 1 diabetes who is intermittently compliant with her insulin was in her usual state of health until 4 days prior to admission when she noted onset of cough sore throat and body aches which she attributed to a URI. Yesterday she felt worse so she was seen in an outside urgent care facility and was diagnosed with influenza A and started on Tamiflu. Patient notes that last night she developed nausea and vomiting which was intractable so comes in for further evaluation. Patient states she feels very weak and tired. No chest taken her insulin for several days. Notes her last episode of DKA was greater than 5 years ago and this is not a usual occurrence for her. Patient admits to slight fevers and a few chills. Does note she feels short of breath lying at rest over the past couple of days which is not worsened with exertion. Denies rash.   ED Course:  The patient was noted to be in DKA. Chest x-ray is negative. She is admitted for management of DKA  ROS:   ROS   Review of Systems: Respiratory: Positive cough,, shortness of breath, no hemoptysis Cardiovascular: No palpitations, chest pain GI: No  diarrhea, constipation GU: No dysuria, increased frequency Musculoskeletal: No back pain, joint pain Blood/lymphatics: No easy bruising, bleeding Mood/affect: No anxiety/depression    Past Medical History:   Past Medical History:  Diagnosis Date  . Diabetes mellitus    type I, diagnosed age 61yo    Past Surgical History:   Past Surgical History:  Procedure Laterality Date  . EYE MUSCLE SURGERY  approx 2010   bilat eye surgury, left  exotropia worse;Dr Maple Hudson, opthomology    Social History:   Social History   Socioeconomic History  . Marital status: Single    Spouse name: Not on file  . Number of children: Not on file  . Years of education: Not on file  . Highest education level: Not on file  Social Needs  . Financial resource strain: Not on file  . Food insecurity - worry: Not on file  . Food insecurity - inability: Not on file  . Transportation needs - medical: Not on file  . Transportation needs - non-medical: Not on file  Occupational History  . Occupation: Dentist: UNEMPLOYED  Tobacco Use  . Smoking status: Never Smoker  . Smokeless tobacco: Never Used  Substance and Sexual Activity  . Alcohol use: No  . Drug use: No  . Sexual activity: Not on file  Other Topics Concern  . Not on file  Social History Narrative  . Not on file    Allergies   Peanut oil  Family history:   Family History  Problem Relation Age of Onset  . Alcohol abuse Other   . Arthritis Other   . Hyperlipidemia Other   . Stroke Other   . Diabetes Other   . Hyperlipidemia Other   . Hypertension Other   . Diabetes Other     Current Medications:   Prior to Admission medications   Medication Sig Start Date End Date Taking? Authorizing Provider  albuterol (PROVENTIL HFA;VENTOLIN HFA)  108 (90 Base) MCG/ACT inhaler Inhale 2 puffs into the lungs every 6 (six) hours as needed for wheezing or shortness of breath. 03/05/17   Fisher, Roselyn Bering, PA-C  cephALEXin (KEFLEX) 500 MG capsule Take 1 capsule (500 mg total) by mouth 4 (four) times daily. 06/15/15   Katha Hamming, MD  fluconazole (DIFLUCAN) 150 MG tablet Take 1 tablet (150 mg total) by mouth daily. 06/15/15   Katha Hamming, MD  insulin aspart (NOVOLOG) 100 UNIT/ML injection Inject 9 Units into the skin 3 (three) times daily before meals. Patient is dosed based on a sliding scale. 06/16/15   Katha Hamming, MD  insulin glargine (LANTUS) 100 unit/mL  SOPN Inject 0.3 mLs (30 Units total) into the skin at bedtime. 06/16/15   Katha Hamming, MD  oseltamivir (TAMIFLU) 75 MG capsule Take 1 capsule (75 mg total) by mouth 2 (two) times daily. 03/05/17   Fisher, Roselyn Bering, PA-C  valACYclovir (VALTREX) 1000 MG tablet Take 1 tablet (1,000 mg total) by mouth 2 (two) times daily. 06/16/15   Katha Hamming, MD    Physical Exam:   Vitals:   03/06/17 0815 03/06/17 0830 03/06/17 0845 03/06/17 0900  BP: (!) 138/91 96/82 (!) 131/91 (!) 145/92  Pulse: 78 (!) 126 (!) 127 (!) 125  Resp: (!) 22 (!) 21 20 20   Temp:      TempSrc:      SpO2: 94% 98% 100% 99%  Weight:      Height:         Physical Exam: Blood pressure (!) 145/92, pulse (!) 125, temperature 97.7 F (36.5 C), temperature source Oral, resp. rate 20, height 5\' 8"  (1.727 m), weight 72.6 kg (160 lb), last menstrual period 03/05/2017, SpO2 99 %. Gen: Tired-appearing young female lying in bed with concerns sister at bedside. Eyes: Sclerae anicteric. Conjunctiva mildly injected. Neck: Supple, no jugular venous distention. Chest: Moderately good air entry bilaterally with no adventitious sounds.  CV: Tachycardia, regular.  Abdomen: NABS, soft, nondistended, nontender. No tenderness to light or deep palpation. Extremities: No edema.  Skin: Warm and dry. No rashes, lesions or wounds. Neuro: Alert and oriented times 3; grossly nonfocal. Psych: Patient is cooperative, logical and coherent with appropriate mood and affect.  Data Review:    Labs: Basic Metabolic Panel: Recent Labs  Lab 03/06/17 0911 03/06/17 0915 03/06/17 0931  NA 132* 135 136  K 8.0* 5.0 4.8  CL 107 104 109  CO2  --  <7*  --   GLUCOSE 480* 460* 479*  BUN 30* 18 20  CREATININE 0.60 1.22* 0.60  CALCIUM  --  9.3  --    Liver Function Tests: Recent Labs  Lab 03/06/17 0915  AST 12*  ALT 13*  ALKPHOS 82  BILITOT 1.4*  PROT 8.8*  ALBUMIN 4.2   Recent Labs  Lab 03/06/17 0915  LIPASE 20   No results for  input(s): AMMONIA in the last 168 hours. CBC: Recent Labs  Lab 03/06/17 0818 03/06/17 0911 03/06/17 0931  WBC 11.5*  --   --   NEUTROABS 8.5*  --   --   HGB 16.3* 17.3* 16.0*  HCT 47.1* 51.0* 47.0*  MCV 92.9  --   --   PLT 237  --   --    Cardiac Enzymes: No results for input(s): CKTOTAL, CKMB, CKMBINDEX, TROPONINI in the last 168 hours.  BNP (last 3 results) No results for input(s): PROBNP in the last 8760 hours. CBG: Recent Labs  Lab 03/05/17 0934 03/06/17 0809  GLUCAP 280* 420*    Urinalysis    Component Value Date/Time   COLORURINE YELLOW (A) 06/12/2015 1654   APPEARANCEUR HAZY (A) 06/12/2015 1654   LABSPEC 1.021 06/12/2015 1654   PHURINE 5.0 06/12/2015 1654   GLUCOSEU >500 (A) 06/12/2015 1654   GLUCOSEU >=1000 12/01/2010 1051   HGBUR NEGATIVE 06/12/2015 1654   BILIRUBINUR NEGATIVE 06/12/2015 1654   KETONESUR 2+ (A) 06/12/2015 1654   PROTEINUR 100 (A) 06/12/2015 1654   UROBILINOGEN 0.2 12/01/2010 1051   NITRITE NEGATIVE 06/12/2015 1654   LEUKOCYTESUR 2+ (A) 06/12/2015 1654      Radiographic Studies: Dg Chest 2 View  Result Date: 03/05/2017 CLINICAL DATA:  Shortness of breath, productive cough EXAM: CHEST  2 VIEW COMPARISON:  06/12/2015 FINDINGS: The heart size and mediastinal contours are within normal limits. Both lungs are clear. The visualized skeletal structures are unremarkable. IMPRESSION: No active cardiopulmonary disease. Electronically Signed   By: Elige KoHetal  Patel   On: 03/05/2017 09:32   Dg Chest Portable 1 View  Result Date: 03/06/2017 CLINICAL DATA:  Cough, vomiting EXAM: PORTABLE CHEST 1 VIEW COMPARISON:  03/05/2017 FINDINGS: Lungs are clear.  No pleural effusion or pneumothorax. The heart is normal in size. Visualized osseous structures are within normal limits. IMPRESSION: Normal chest radiographs. Electronically Signed   By: Charline BillsSriyesh  Krishnan M.D.   On: 03/06/2017 08:49    EKG: Independently reviewed. Sinus rhythm at 1:30. Normal intervals.  Normal axis. T-wave inversions in 3.   Assessment/Plan:   Principal Problem:   DKA (diabetic ketoacidoses) (HCC) Active Problems:   Influenza A   Vomiting   DKA DKA precipitated by noncompliance with insulin and influenza Patient is very dry and reasonably acidotic with bicarbonate of 9 Will hydrate aggressively and start insulin drip per DKA protocol Potassium is 4.8 we'll follow and replete as needed per protocol. Discussed long-term sequelae of not taking insulin with this young woman.  INFLUENZA  Continue Tamiflu     Other information:   DVT prophylaxis: Lovenox ordered. Code Status: Full code. Family Communication: Patient's sister was at bedside  Disposition Plan: Home Consults called: None Admission status: Observation    Pieter PartridgeSrobona Tublu Chatterjee [facility] Pager [phone] Cell: [phone]   If 7PM-7AM, please contact night-coverage [email or web address] Password [email or web address] 03/06/2017, 10:56 AM

## 2017-03-06 NOTE — Progress Notes (Signed)
Patient arrived to 3M06. CHG bath completed Patient alert and oriented x4. Denies any pain or discomfort at this time.

## 2017-03-07 DIAGNOSIS — J101 Influenza due to other identified influenza virus with other respiratory manifestations: Secondary | ICD-10-CM | POA: Diagnosis not present

## 2017-03-07 DIAGNOSIS — E1010 Type 1 diabetes mellitus with ketoacidosis without coma: Secondary | ICD-10-CM

## 2017-03-07 DIAGNOSIS — I493 Ventricular premature depolarization: Secondary | ICD-10-CM | POA: Diagnosis not present

## 2017-03-07 DIAGNOSIS — Z79899 Other long term (current) drug therapy: Secondary | ICD-10-CM | POA: Diagnosis not present

## 2017-03-07 DIAGNOSIS — R000 Tachycardia, unspecified: Secondary | ICD-10-CM | POA: Diagnosis not present

## 2017-03-07 DIAGNOSIS — Z794 Long term (current) use of insulin: Secondary | ICD-10-CM | POA: Diagnosis not present

## 2017-03-07 DIAGNOSIS — E860 Dehydration: Secondary | ICD-10-CM | POA: Diagnosis not present

## 2017-03-07 DIAGNOSIS — Z91010 Allergy to peanuts: Secondary | ICD-10-CM | POA: Diagnosis not present

## 2017-03-07 DIAGNOSIS — J111 Influenza due to unidentified influenza virus with other respiratory manifestations: Secondary | ICD-10-CM | POA: Diagnosis not present

## 2017-03-07 DIAGNOSIS — E7800 Pure hypercholesterolemia, unspecified: Secondary | ICD-10-CM | POA: Diagnosis not present

## 2017-03-07 LAB — GLUCOSE, CAPILLARY
GLUCOSE-CAPILLARY: 107 mg/dL — AB (ref 65–99)
GLUCOSE-CAPILLARY: 326 mg/dL — AB (ref 65–99)
Glucose-Capillary: 102 mg/dL — ABNORMAL HIGH (ref 65–99)
Glucose-Capillary: 94 mg/dL (ref 65–99)
Glucose-Capillary: 93 mg/dL (ref 65–99)
Glucose-Capillary: 201 mg/dL — ABNORMAL HIGH (ref 65–99)

## 2017-03-07 LAB — BASIC METABOLIC PANEL
Anion gap: 11 (ref 5–15)
BUN: 6 mg/dL (ref 6–20)
CHLORIDE: 110 mmol/L (ref 101–111)
CO2: 14 mmol/L — AB (ref 22–32)
CREATININE: 0.6 mg/dL (ref 0.44–1.00)
Calcium: 8.2 mg/dL — ABNORMAL LOW (ref 8.9–10.3)
GFR calc non Af Amer: >60 mL/min (ref >60)
Glucose, Bld: 134 mg/dL — ABNORMAL HIGH (ref 65–99)
POTASSIUM: 3.5 mmol/L (ref 3.5–5.1)
SODIUM: 135 mmol/L (ref 135–145)

## 2017-03-07 LAB — HIV ANTIBODY (ROUTINE TESTING W REFLEX): HIV SCREEN 4TH GENERATION: NONREACTIVE

## 2017-03-07 NOTE — Discharge Summary (Signed)
Physician Discharge Summary  Mandy Patel ZOX:[id number] DOB: August 24, 1995 DOA: 03/06/2017  PCP: Mandy Levins, MD  Admit date: 03/06/2017 Discharge date: 03/07/2017  Admitted From: home Disposition:  home  Recommendations for Outpatient Follow-up:  1. Follow up with PCP in 1-2 weeks  Home Health: none Equipment/Devices: none  Discharge Condition: stable CODE STATUS: Full code Diet recommendation: diabetic  HPI: Per Dr. Luberta Robertson Mandy Patel is an 22 y.o. female with past medical history significant for type 1 diabetes who is intermittently compliant with her insulin was in her usual state of health until 4 days prior to admission when she noted onset of cough sore throat and body aches which she attributed to a URI. Yesterday she felt worse so she was seen in an outside urgent care facility and was diagnosed with influenza A and started on Tamiflu. Patient notes that last night she developed nausea and vomiting which was intractable so comes in for further evaluation. Patient states she feels very weak and tired. No chest taken her insulin for several days. Notes her last episode of DKA was greater than 5 years ago and this is not a usual occurrence for her. Patient admits to slight fevers and a few chills. Does note she feels short of breath lying at rest over the past couple of days which is not worsened with exertion. Denies rash. ED Course:  The patient was noted to be in DKA. Chest x-ray is negative. She is admitted for management of DKA  Hospital Course: DKA / Type 1 DM - Patient was admitted to the hospital in the setting of DKA due to not taking her insulin.  She has been having significant nausea and vomiting likely due to DKA/influenza also contributing.  She was placed on IV insulin, her anion gap is closed, she was back on her subcutaneous insulin, able to tolerate a regular diet without further GI upset.  Her CBGs were much better, she was discharged home in stable  condition. Influenza A -continue Tamiflu until finished.  She is afebrile her symptoms are improving  Discharge Diagnoses:  Principal Problem:   DKA (diabetic ketoacidoses) (HCC) Active Problems:   Influenza A   Vomiting     Discharge Instructions   Allergies as of 03/07/2017      Reactions   Peanut Oil Anaphylaxis      Medication List    TAKE these medications   albuterol 108 (90 Base) MCG/ACT inhaler Commonly known as:  PROVENTIL HFA;VENTOLIN HFA Inhale 2 puffs into the lungs every 6 (six) hours as needed for wheezing or shortness of breath.   CORICIDIN D COLD/FLU/SINUS PO Take 1 tablet by mouth daily as needed (flu like symptoms).   insulin aspart 100 UNIT/ML injection Commonly known as:  novoLOG Inject 9 Units into the skin 3 (three) times daily before meals. Patient is dosed based on a sliding scale.   insulin glargine 100 unit/mL Sopn Commonly known as:  LANTUS Inject 0.3 mLs (30 Units total) into the skin at bedtime.   oseltamivir 75 MG capsule Commonly known as:  TAMIFLU Take 1 capsule (75 mg total) by mouth 2 (two) times daily.      Follow-up Information    Mandy Levins, MD. Schedule an appointment as soon as possible for a visit in 4 week(s).   Specialties:  Internal Medicine, Radiology Contact information: [address] [facility] [place] [address] [phone]           Consultations:  None  Procedures/Studies:  Dg Chest 2 View  Result Date: 03/05/2017 CLINICAL DATA:  Shortness of breath, productive cough EXAM: CHEST  2 VIEW COMPARISON:  06/12/2015 FINDINGS: The heart size and mediastinal contours are within normal limits. Both lungs are clear. The visualized skeletal structures are unremarkable. IMPRESSION: No active cardiopulmonary disease. Electronically Signed   By: Elige Ko   On: 03/05/2017 09:32   Dg Chest Portable 1 View  Result Date: 03/06/2017 CLINICAL DATA:  Cough, vomiting EXAM: PORTABLE CHEST 1 VIEW COMPARISON:   03/05/2017 FINDINGS: Lungs are clear.  No pleural effusion or pneumothorax. The heart is normal in size. Visualized osseous structures are within normal limits. IMPRESSION: Normal chest radiographs. Electronically Signed   By: Charline Bills M.D.   On: 03/06/2017 08:49      Subjective: - no chest pain, shortness of breath, no abdominal pain, nausea or vomiting.   Discharge Exam: Vitals:   03/07/17 0740 03/07/17 1137  BP: 104/66 116/73  Pulse: (!) 109   Resp: 15   Temp: 97.7 F (36.5 C) 98.1 F (36.7 C)  SpO2: 100%     General: Pt is alert, awake, not in acute distress Cardiovascular: RRR, S1/S2 +, no rubs, no gallops Respiratory: CTA bilaterally, no wheezing, no rhonchi Abdominal: Soft, NT, ND, bowel sounds + Extremities: no edema, no cyanosis    The results of significant diagnostics from this hospitalization (including imaging, microbiology, ancillary and laboratory) are listed below for reference.     Microbiology: Recent Results (from the past 240 hour(s))  MRSA PCR Screening     Status: None   Collection Time: 03/06/17  1:04 PM  Result Value Ref Range Status   MRSA by PCR NEGATIVE NEGATIVE Final    Comment:        The GeneXpert MRSA Assay (FDA approved for NASAL specimens only), is one component of a comprehensive MRSA colonization surveillance program. It is not intended to diagnose MRSA infection nor to guide or monitor treatment for MRSA infections. Performed at [facility], [address] [place], [place] [address]      Labs: BNP (last 3 results) No results for input(s): BNP in the last 8760 hours. Basic Metabolic Panel: Recent Labs  Lab 03/06/17 0915 03/06/17 0931 03/06/17 1307 03/06/17 2121 03/07/17 0810  NA 135 136 134* 135 135  K 5.0 4.8 4.0 3.6 3.5  CL 104 109 107 113* 110  CO2 <7*  --  9* 14* 14*  GLUCOSE 460* 479* 302* 131* 134*  BUN 18 20 15 9 6   CREATININE 1.22* 0.60 1.06* 0.64 0.60  CALCIUM 9.3  --  8.6*  7.8* 8.2*   Liver Function Tests: Recent Labs  Lab 03/06/17 0915  AST 12*  ALT 13*  ALKPHOS 82  BILITOT 1.4*  PROT 8.8*  ALBUMIN 4.2   Recent Labs  Lab 03/06/17 0915  LIPASE 20   No results for input(s): AMMONIA in the last 168 hours. CBC: Recent Labs  Lab 03/06/17 0818 03/06/17 0911 03/06/17 0931 03/06/17 1307  WBC 11.5*  --   --  12.9*  NEUTROABS 8.5*  --   --   --   HGB 16.3* 17.3* 16.0* 14.6  HCT 47.1* 51.0* 47.0* 42.9  MCV 92.9  --   --  93.3  PLT 237  --   --  276   Cardiac Enzymes: No results for input(s): CKTOTAL, CKMB, CKMBINDEX, TROPONINI in the last 168 hours. BNP: Invalid input(s): POCBNP CBG: Recent Labs  Lab 03/07/17 0033 03/07/17  0138 03/07/17 0328 03/07/17 0742 03/07/17 1123  GLUCAP 107* 102* 94 93 201*   D-Dimer No results for input(s): DDIMER in the last 72 hours. Hgb A1c No results for input(s): HGBA1C in the last 72 hours. Lipid Profile No results for input(s): CHOL, HDL, LDLCALC, TRIG, CHOLHDL, LDLDIRECT in the last 72 hours. Thyroid function studies No results for input(s): TSH, T4TOTAL, T3FREE, THYROIDAB in the last 72 hours.  Invalid input(s): FREET3 Anemia work up No results for input(s): VITAMINB12, FOLATE, FERRITIN, TIBC, IRON, RETICCTPCT in the last 72 hours. Urinalysis    Component Value Date/Time   COLORURINE YELLOW (A) 06/12/2015 1654   APPEARANCEUR HAZY (A) 06/12/2015 1654   LABSPEC 1.021 06/12/2015 1654   PHURINE 5.0 06/12/2015 1654   GLUCOSEU >500 (A) 06/12/2015 1654   GLUCOSEU >=1000 12/01/2010 1051   HGBUR NEGATIVE 06/12/2015 1654   BILIRUBINUR NEGATIVE 06/12/2015 1654   KETONESUR 2+ (A) 06/12/2015 1654   PROTEINUR 100 (A) 06/12/2015 1654   UROBILINOGEN 0.2 12/01/2010 1051   NITRITE NEGATIVE 06/12/2015 1654   LEUKOCYTESUR 2+ (A) 06/12/2015 1654   Sepsis Labs Invalid input(s): PROCALCITONIN,  WBC,  LACTICIDVEN   Time coordinating discharge: 25 minutes  SIGNED:  Pamella Pertostin Gherghe, MD  [facility] 03/07/2017, 2:20 PM Pager [phone]  If 7PM-7AM, please contact night-coverage [email or web address] Password [email or web address]

## 2017-03-07 NOTE — Discharge Instructions (Signed)
Follow with Corwin LevinsJohn, James W, MD as needed  Please get a complete blood count and chemistry panel checked by your Primary MD at your next visit, and again as instructed by your Primary MD. Please get your medications reviewed and adjusted by your Primary MD.  Please request your Primary MD to go over all Hospital Tests and Procedure/Radiological results at the follow up, please get all Hospital records sent to your Prim MD by signing hospital release before you go home.  If you had Pneumonia of Lung problems at the Hospital: Please get a 2 view Chest X ray done in 6-8 weeks after hospital discharge or sooner if instructed by your Primary MD.  If you have Congestive Heart Failure: Please call your Cardiologist or Primary MD anytime you have any of the following symptoms:  1) 3 pound weight gain in 24 hours or 5 pounds in 1 week  2) shortness of breath, with or without a dry hacking cough  3) swelling in the hands, feet or stomach  4) if you have to sleep on extra pillows at night in order to breathe  Follow cardiac low salt diet and 1.5 lit/day fluid restriction.  If you have diabetes Accuchecks 4 times/day, Once in AM empty stomach and then before each meal. Log in all results and show them to your primary doctor at your next visit. If any glucose reading is under 80 or above 300 call your primary MD immediately.  If you have Seizure/Convulsions/Epilepsy: Please do not drive, operate heavy machinery, participate in activities at heights or participate in high speed sports until you have seen by Primary MD or a Neurologist and advised to do so again.  If you had Gastrointestinal Bleeding: Please ask your Primary MD to check a complete blood count within one week of discharge or at your next visit. Your endoscopic/colonoscopic biopsies that are pending at the time of discharge, will also need to followed by your Primary MD.  Get Medicines reviewed and adjusted. Please take all your  medications with you for your next visit with your Primary MD  Please request your Primary MD to go over all hospital tests and procedure/radiological results at the follow up, please ask your Primary MD to get all Hospital records sent to his/her office.  If you experience worsening of your admission symptoms, develop shortness of breath, life threatening emergency, suicidal or homicidal thoughts you must seek medical attention immediately by calling 911 or calling your MD immediately  if symptoms less severe.  You must read complete instructions/literature along with all the possible adverse reactions/side effects for all the Medicines you take and that have been prescribed to you. Take any new Medicines after you have completely understood and accpet all the possible adverse reactions/side effects.   Do not drive or operate heavy machinery when taking Pain medications.   Do not take more than prescribed Pain, Sleep and Anxiety Medications  Special Instructions: If you have smoked or chewed Tobacco  in the last 2 yrs please stop smoking, stop any regular Alcohol  and or any Recreational drug use.  Wear Seat belts while driving.  Please note You were cared for by a hospitalist during your hospital stay. If you have any questions about your discharge medications or the care you received while you were in the hospital after you are discharged, you can call the unit and asked to speak with the hospitalist on call if the hospitalist that took care of you is not available. Once  you are discharged, your primary care physician will handle any further medical issues. Please note that NO REFILLS for any discharge medications will be authorized once you are discharged, as it is imperative that you return to your primary care physician (or establish a relationship with a primary care physician if you do not have one) for your aftercare needs so that they can reassess your need for medications and monitor your  lab values.  You can reach the hospitalist office at phone [phone] or fax [phone]   If you do not have a primary care physician, you can call [phone] for a physician referral.  Activity: As tolerated with Full fall precautions use walker/cane & assistance as needed  Diet: diabetic  Disposition Home

## 2017-03-08 ENCOUNTER — Telehealth: Payer: Self-pay | Admitting: *Deleted

## 2017-03-08 NOTE — Telephone Encounter (Signed)
Pt was on TCM report admitted 03/06/17 for N/V and flu like sxs. She has been having significant nausea and vomiting likely due to DKA/influenza also contributing. Pt D/C 03/07/17, and was told to follow-up w/PCP Dr. Jonny RuizJohn. Pt has not seen Dr. Jonny RuizJohn since 11/2010. Tried to call pt to check to see which provider she is seeing now. # on file not a working number.Marland Kitchen.Raechel Chute/lmb

## 2017-03-13 ENCOUNTER — Emergency Department (HOSPITAL_COMMUNITY): Payer: 59

## 2017-03-13 ENCOUNTER — Other Ambulatory Visit: Payer: Self-pay

## 2017-03-13 ENCOUNTER — Encounter (HOSPITAL_COMMUNITY): Payer: Self-pay

## 2017-03-13 ENCOUNTER — Emergency Department (HOSPITAL_COMMUNITY)
Admission: EM | Admit: 2017-03-13 | Discharge: 2017-03-13 | Disposition: A | Discharge status: Home / Self Care | Payer: 59 | Source: Home / Self Care | Attending: Emergency Medicine | Admitting: Emergency Medicine

## 2017-03-13 DIAGNOSIS — Z91010 Allergy to peanuts: Secondary | ICD-10-CM | POA: Insufficient documentation

## 2017-03-13 DIAGNOSIS — R05 Cough: Secondary | ICD-10-CM

## 2017-03-13 DIAGNOSIS — R109 Unspecified abdominal pain: Secondary | ICD-10-CM | POA: Diagnosis not present

## 2017-03-13 DIAGNOSIS — Z794 Long term (current) use of insulin: Secondary | ICD-10-CM | POA: Insufficient documentation

## 2017-03-13 DIAGNOSIS — N12 Tubulo-interstitial nephritis, not specified as acute or chronic: Secondary | ICD-10-CM | POA: Insufficient documentation

## 2017-03-13 DIAGNOSIS — E109 Type 1 diabetes mellitus without complications: Secondary | ICD-10-CM

## 2017-03-13 LAB — COMPREHENSIVE METABOLIC PANEL
ALBUMIN: 3.1 g/dL — AB (ref 3.5–5.0)
ALT: 21 U/L (ref 14–54)
AST: 19 U/L (ref 15–41)
Alkaline Phosphatase: 82 U/L (ref 38–126)
Anion gap: 12 (ref 5–15)
CHLORIDE: 95 mmol/L — AB (ref 101–111)
CO2: 24 mmol/L (ref 22–32)
CREATININE: 0.55 mg/dL (ref 0.44–1.00)
Calcium: 8.2 mg/dL — ABNORMAL LOW (ref 8.9–10.3)
GFR calc Af Amer: >60 mL/min (ref >60)
GFR calc non Af Amer: >60 mL/min (ref >60)
GLUCOSE: 286 mg/dL — AB (ref 65–99)
Potassium: 3 mmol/L — ABNORMAL LOW (ref 3.5–5.1)
SODIUM: 131 mmol/L — AB (ref 135–145)
Total Bilirubin: 0.8 mg/dL (ref 0.3–1.2)
Total Protein: 7.8 g/dL (ref 6.5–8.1)

## 2017-03-13 LAB — URINALYSIS, ROUTINE W REFLEX MICROSCOPIC
Bilirubin Urine: NEGATIVE
Glucose, UA: >=500 mg/dL — AB
Ketones, ur: 80 mg/dL — AB
Nitrite: NEGATIVE
Protein, ur: NEGATIVE mg/dL
SPECIFIC GRAVITY, URINE: 1.016 (ref 1.005–1.030)
pH: 6 (ref 5.0–8.0)

## 2017-03-13 LAB — CBC WITH DIFFERENTIAL/PLATELET
BASOS PCT: 0 %
Basophils Absolute: 0 10*3/uL (ref 0.0–0.1)
Eosinophils Absolute: 0 10*3/uL (ref 0.0–0.7)
Eosinophils Relative: 0 %
HEMATOCRIT: 32.4 % — AB (ref 36.0–46.0)
Hemoglobin: 10.6 g/dL — ABNORMAL LOW (ref 12.0–15.0)
LYMPHS ABS: 2.5 10*3/uL (ref 0.7–4.0)
Lymphocytes Relative: 13 %
MCH: 30.8 pg (ref 26.0–34.0)
MCHC: 32.7 g/dL (ref 30.0–36.0)
MCV: 94.2 fL (ref 78.0–100.0)
MONO ABS: 3.1 10*3/uL — AB (ref 0.1–1.0)
MONOS PCT: 16 %
NEUTROS ABS: 13.9 10*3/uL — AB (ref 1.7–7.7)
Neutrophils Relative %: 71 %
PLATELETS: 239 10*3/uL (ref 150–400)
RBC: 3.44 MIL/uL — ABNORMAL LOW (ref 3.87–5.11)
RDW: 12.9 % (ref 11.5–15.5)
WBC: 19.5 10*3/uL — ABNORMAL HIGH (ref 4.0–10.5)

## 2017-03-13 LAB — I-STAT CG4 LACTIC ACID, ED: Lactic Acid, Venous: 0.85 mmol/L (ref 0.5–1.9)

## 2017-03-13 LAB — POC URINE PREG, ED: PREG TEST UR: NEGATIVE

## 2017-03-13 MED ORDER — KETOROLAC TROMETHAMINE 30 MG/ML IJ SOLN
30.0000 mg | Freq: Once | INTRAMUSCULAR | Status: AC
Start: 2017-03-13 — End: 2017-03-13
  Administered 2017-03-13: 30 mg via INTRAVENOUS
  Filled 2017-03-13: qty 1

## 2017-03-13 MED ORDER — IBUPROFEN 800 MG PO TABS: 800.0000 mg | ORAL_TABLET | Freq: Three times a day (TID) | ORAL | 0 refills | Status: DC

## 2017-03-13 MED ORDER — POTASSIUM CHLORIDE CRYS ER 20 MEQ PO TBCR
40.0000 meq | EXTENDED_RELEASE_TABLET | Freq: Once | ORAL | Status: AC
  Administered 2017-03-13: 40 meq via ORAL
  Filled 2017-03-13: qty 2

## 2017-03-13 MED ORDER — MORPHINE SULFATE (PF) 4 MG/ML IV SOLN
4.0000 mg | Freq: Once | INTRAVENOUS | Status: AC
  Administered 2017-03-13: 4 mg via INTRAVENOUS
  Filled 2017-03-13: qty 1

## 2017-03-13 MED ORDER — DEXTROSE 5 % IV SOLN
1.0000 g | Freq: Once | INTRAVENOUS | Status: AC
  Administered 2017-03-13: 1 g via INTRAVENOUS
  Filled 2017-03-13: qty 10

## 2017-03-13 MED ORDER — SODIUM CHLORIDE 0.9 % IV BOLUS (SEPSIS)
1000.0000 mL | Freq: Once | INTRAVENOUS | Status: AC
  Administered 2017-03-13: 1000 mL via INTRAVENOUS

## 2017-03-13 MED ORDER — CEPHALEXIN 500 MG PO CAPS: 500.0000 mg | ORAL_CAPSULE | Freq: Four times a day (QID) | ORAL | 0 refills | Status: DC

## 2017-03-13 NOTE — ED Triage Notes (Signed)
Patient complains of 2 days of right flank pain. Reports urine looks dark and pain worse with movement

## 2017-03-13 NOTE — ED Provider Notes (Signed)
[facility] EMERGENCY DEPARTMENT Provider Note   CSN: [id number] Arrival date & time: 03/13/17  1008     History   Chief Complaint No chief complaint on file.   HPI Mandy Patel is a 22 y.o. female.  The history is provided by the patient and medical records. No language interpreter was used.   Mandy Patel is a 22 y.o. female  with a PMH of DM1 who presents to the Emergency Department complaining of constant right flank pain which began 2 days ago.  Pain is described as sharp.  Will wax and wane in intensity.  No medications taken prior to arrival for symptoms.  She denies vaginal discharge, abdominal pain, nausea, vomiting or back pain.  She denies dysuria, urinary urgency, urinary frequency or hesitancy.  She does report being diagnosed with the flu about a week ago.  She subsequently required hospitalization for 2 days (1 night) due to DKA/flu.  She reports her flulike symptoms have improved.  She has minimal coughing and has not had a fever in a few days.  Her sugars have been back to normal.  She checked CBG prior to arrival and reports it was around 110.  She has been taking medications as directed.  She denies any history of similar symptoms.  Past Medical History:  Diagnosis Date  . Diabetes mellitus    type I, diagnosed age 64yo    Patient Active Problem List   Diagnosis Date Noted  . Influenza A 03/06/2017  . Vomiting 03/06/2017  . DKA (diabetic ketoacidoses) (HCC) 06/12/2015  . Type I (juvenile type) diabetes mellitus without mention of complication, uncontrolled 04/26/2011  . Preventative health care 12/01/2010  . Obesity 06/19/2010  . Goiter, unspecified 06/19/2010  . HYPERCHOLESTEROLEMIA 04/25/2008    Past Surgical History:  Procedure Laterality Date  . EYE MUSCLE SURGERY  approx 2010   bilat eye surgury, left exotropia worse;Dr Maple Hudson, opthomology    OB History    No data available       Home Medications    Prior to Admission  medications   Medication Sig Start Date End Date Taking? Authorizing Provider  albuterol (PROVENTIL HFA;VENTOLIN HFA) 108 (90 Base) MCG/ACT inhaler Inhale 2 puffs into the lungs every 6 (six) hours as needed for wheezing or shortness of breath. 03/05/17  Yes Fisher, Roselyn Bering, PA-C  insulin aspart (NOVOLOG) 100 UNIT/ML injection Inject 9 Units into the skin 3 (three) times daily before meals. Patient is dosed based on a sliding scale. 06/16/15  Yes Katha Hamming, MD  Insulin Detemir (LEVEMIR FLEXTOUCH) 100 UNIT/ML Pen Inject 30 Units into the skin daily at 10 pm.   Yes [provider]  cephALEXin (KEFLEX) 500 MG capsule Take 1 capsule (500 mg total) by mouth 4 (four) times daily. 03/13/17   Ward, Chase Picket, PA-C  Chlorphen-PE-Acetaminophen (CORICIDIN D COLD/FLU/SINUS PO) Take 1 tablet by mouth daily as needed (flu like symptoms).    [provider]  ibuprofen (ADVIL,MOTRIN) 800 MG tablet Take 1 tablet (800 mg total) by mouth 3 (three) times daily. 03/13/17   Ward, Chase Picket, PA-C  insulin glargine (LANTUS) 100 unit/mL SOPN Inject 0.3 mLs (30 Units total) into the skin at bedtime. Patient not taking: Reported on 03/13/2017 06/16/15   Katha Hamming, MD  oseltamivir (TAMIFLU) 75 MG capsule Take 1 capsule (75 mg total) by mouth 2 (two) times daily. Patient not taking: Reported on 03/13/2017 03/05/17   Faythe Ghee, PA-C    Family History  Family History  Problem Relation Age of Onset  . Alcohol abuse Other   . Arthritis Other   . Hyperlipidemia Other   . Stroke Other   . Diabetes Other   . Hyperlipidemia Other   . Hypertension Other   . Diabetes Other     Social History Social History   Tobacco Use  . Smoking status: Never Smoker  . Smokeless tobacco: Never Used  Substance Use Topics  . Alcohol use: No  . Drug use: No     Allergies   Peanut oil   Review of Systems Review of Systems  Genitourinary: Positive for flank pain. Negative for dysuria,  frequency, urgency and vaginal discharge.  All other systems reviewed and are negative.    Physical Exam Updated Vital Signs BP 125/81   Pulse (!) 103   Temp 98.2 F (36.8 C) (Oral)   Resp 17   LMP 03/05/2017   SpO2 100%   Physical Exam  Constitutional: She is oriented to person, place, and time. She appears well-developed and well-nourished. No distress.  Nontoxic-appearing.  HENT:  Head: Normocephalic and atraumatic.  Neck: Neck supple.  Cardiovascular: Normal heart sounds.  No murmur heard. Tachycardic but regular.  Pulmonary/Chest: Effort normal and breath sounds normal. No respiratory distress.  Abdominal: Soft. She exhibits no distension.  Tenderness to palpation along right flank.  No abdominal or CVA tenderness.  Neurological: She is alert and oriented to person, place, and time.  Skin: Skin is warm and dry.  Nursing note and vitals reviewed.    ED Treatments / Results  Labs (all labs ordered are listed, but only abnormal results are displayed) Labs Reviewed  URINALYSIS, ROUTINE W REFLEX MICROSCOPIC - Abnormal; Notable for the following components:      Result Value   APPearance CLOUDY (*)    Glucose, UA >=500 (*)    Hgb urine dipstick SMALL (*)    Ketones, ur 80 (*)    Leukocytes, UA MODERATE (*)    Bacteria, UA MANY (*)    Squamous Epithelial / LPF 0-5 (*)    All other components within normal limits  COMPREHENSIVE METABOLIC PANEL - Abnormal; Notable for the following components:   Sodium 131 (*)    Potassium 3.0 (*)    Chloride 95 (*)    Glucose, Bld 286 (*)    BUN <5 (*)    Calcium 8.2 (*)    Albumin 3.1 (*)    All other components within normal limits  CBC WITH DIFFERENTIAL/PLATELET - Abnormal; Notable for the following components:   WBC 19.5 (*)    RBC 3.44 (*)    Hemoglobin 10.6 (*)    HCT 32.4 (*)    Neutro Abs 13.9 (*)    Monocytes Absolute 3.1 (*)    All other components within normal limits  URINE CULTURE  POC URINE PREG, ED  I-STAT  CG4 LACTIC ACID, ED  I-STAT CG4 LACTIC ACID, ED    EKG  EKG Interpretation None       Radiology Ct Renal Stone Study  Result Date: 03/13/2017 CLINICAL DATA:  Two days of RIGHT flank pain EXAM: CT ABDOMEN AND PELVIS WITHOUT CONTRAST TECHNIQUE: Multidetector CT imaging of the abdomen and pelvis was performed following the standard protocol without IV contrast. COMPARISON:  Ultrasound 05/19/1998 FINDINGS: Lower chest: Lung bases are clear. Hepatobiliary: No focal hepatic lesion. No biliary duct dilatation. Gallbladder is normal. Common bile duct is normal. Pancreas: Pancreas is normal. No ductal dilatation. No pancreatic inflammation. Spleen:  Normal spleen Adrenals/urinary tract: Adrenal glands difficult to identify. No nephrolithiasis or ureterolithiasis. No obstructive uropathy. No bladder calculi Stomach/Bowel: Stomach, small bowel, appendix, and cecum are normal. The colon and rectosigmoid colon are normal. The colon and rectosigmoid colon are normal. Vascular/Lymphatic: Abdominal aorta is normal caliber. There is no retroperitoneal or periportal lymphadenopathy. No pelvic lymphadenopathy. Reproductive: Uterus and ovaries normal Other: No free fluid. Musculoskeletal: No aggressive osseous lesion. IMPRESSION: 1. No nephrolithiasis or ureterolithiasis. 2. Normal appendix. 3. Exam somewhat difficult interpret due to the no IV or oral contrast and limited intra-abdominal fat. Electronically Signed   By: Genevive BiStewart  Edmunds M.D.   On: 03/13/2017 12:39    Procedures Procedures (including critical care time)  Medications Ordered in ED Medications  sodium chloride 0.9 % bolus 1,000 mL (0 mLs Intravenous Stopped 03/13/17 1259)  ketorolac (TORADOL) 30 MG/ML injection 30 mg (30 mg Intravenous Given 03/13/17 1153)  cefTRIAXone (ROCEPHIN) 1 g in dextrose 5 % 50 mL IVPB (0 g Intravenous Stopped 03/13/17 1356)  morphine 4 MG/ML injection 4 mg (4 mg Intravenous Given 03/13/17 1326)  potassium chloride SA  (K-DUR,KLOR-CON) CR tablet 40 mEq (40 mEq Oral Given 03/13/17 1423)     Initial Impression / Assessment and Plan / ED Course  I have reviewed the triage vital signs and the nursing notes.  Pertinent labs & imaging results that were available during my care of the patient were reviewed by me and considered in my medical decision making (see chart for details).    Riven A Freida Busmanllen is a 22 y.o. female with hx of DM1 who presents to ED for right flank pain. Afebrile. Tachycardic 110's. Non-toxic appearing. She is tender along the right flank. No abdominal or CVA tenderness. UA with moderate leuks, TNTC wbc's and many wbc's. She is having no urinary symptoms, however given flank pain and DM, will send for cx and treat.  Rocephin given in ED today. Labs reviewed. She does have notable leukocytosis of 19.5. Lactic wdl. CMP with potassium of 3.0 - replenished in ED. Glucose of 286 with normal CO2 and anion gap. Clinically does not appear to be in DKA. Given pain medication and IV hydration in ED.   On re-evaluation, patient feels much better. Tachycardia improving with IV fluids. She is tolerating PO and has had no nausea or vomiting. Repeat temperature normal. She would like to go home. I spoke with the patient at length about monitoring blood sugar closely and taking DM medications as directed. We spoke about how infections can increase sugar and to return to ER if glucose acutely elevates. I also encouraged her to return to ER immediately for fever, vomiting or new/worsening symptoms. I stressed the importance of low threshold to return for the aforementioned sxs. Patient agrees. Will discharge on Keflex and have her follow up with PCP for recheck. All questions answered.   Patient discussed with Dr. Charm BargesButler who agrees with treatment plan.   Final Clinical Impressions(s) / ED Diagnoses   Final diagnoses:  Pyelonephritis    ED Discharge Orders        Ordered    cephALEXin (KEFLEX) 500 MG capsule  4  times daily     03/13/17 1531    ibuprofen (ADVIL,MOTRIN) 800 MG tablet  3 times daily     03/13/17 1531       Ward, Chase PicketJaime Pilcher, PA-C 03/13/17 1546    Terrilee FilesButler, Michael C, MD 03/13/17 [phone]

## 2017-03-13 NOTE — Discharge Instructions (Signed)
It was my pleasure taking care of you today!   Please take all of your antibiotics until finished!  Increase hydration.   Keep a VERY close watch on your blood sugars and take diabetic medications as directed.   Please call your primary care doctor and schedule a follow up appointment, preferably in the next week.   Return to ER for fever, vomiting, worsening abdominal pain, very elevated sugars, new or worsening symptoms, any additional concerns.

## 2017-03-14 ENCOUNTER — Inpatient Hospital Stay (HOSPITAL_COMMUNITY)
Admission: EM | Admit: 2017-03-14 | Discharge: 2017-03-17 | DRG: 689 | Disposition: A | Discharge status: Home / Self Care | Payer: 59 | Attending: Internal Medicine | Admitting: Internal Medicine

## 2017-03-14 ENCOUNTER — Encounter (HOSPITAL_COMMUNITY): Payer: Self-pay | Admitting: Emergency Medicine

## 2017-03-14 ENCOUNTER — Other Ambulatory Visit: Payer: Self-pay

## 2017-03-14 DIAGNOSIS — R102 Pelvic and perineal pain: Secondary | ICD-10-CM | POA: Diagnosis not present

## 2017-03-14 DIAGNOSIS — E1010 Type 1 diabetes mellitus with ketoacidosis without coma: Secondary | ICD-10-CM | POA: Diagnosis present

## 2017-03-14 DIAGNOSIS — N12 Tubulo-interstitial nephritis, not specified as acute or chronic: Secondary | ICD-10-CM | POA: Diagnosis present

## 2017-03-14 DIAGNOSIS — E860 Dehydration: Secondary | ICD-10-CM | POA: Diagnosis not present

## 2017-03-14 DIAGNOSIS — N136 Pyonephrosis: Principal | ICD-10-CM | POA: Diagnosis present

## 2017-03-14 DIAGNOSIS — E876 Hypokalemia: Secondary | ICD-10-CM | POA: Diagnosis not present

## 2017-03-14 DIAGNOSIS — R1011 Right upper quadrant pain: Secondary | ICD-10-CM | POA: Diagnosis not present

## 2017-03-14 DIAGNOSIS — N10 Acute tubulo-interstitial nephritis: Secondary | ICD-10-CM | POA: Diagnosis not present

## 2017-03-14 DIAGNOSIS — E1065 Type 1 diabetes mellitus with hyperglycemia: Secondary | ICD-10-CM | POA: Diagnosis not present

## 2017-03-14 DIAGNOSIS — Z794 Long term (current) use of insulin: Secondary | ICD-10-CM | POA: Diagnosis not present

## 2017-03-14 DIAGNOSIS — R109 Unspecified abdominal pain: Secondary | ICD-10-CM | POA: Diagnosis not present

## 2017-03-14 DIAGNOSIS — N1330 Unspecified hydronephrosis: Secondary | ICD-10-CM | POA: Diagnosis not present

## 2017-03-14 HISTORY — DX: Tubulo-interstitial nephritis, not specified as acute or chronic: N12

## 2017-03-14 LAB — URINE CULTURE: Culture: <10000 — AB

## 2017-03-14 LAB — CBC
HCT: 29.6 % — ABNORMAL LOW (ref 36.0–46.0)
Hemoglobin: 9.9 g/dL — ABNORMAL LOW (ref 12.0–15.0)
MCH: 31.4 pg (ref 26.0–34.0)
MCHC: 33.4 g/dL (ref 30.0–36.0)
MCV: 94 fL (ref 78.0–100.0)
PLATELETS: 256 10*3/uL (ref 150–400)
RBC: 3.15 MIL/uL — ABNORMAL LOW (ref 3.87–5.11)
RDW: 12.9 % (ref 11.5–15.5)
WBC: 18.7 10*3/uL — AB (ref 4.0–10.5)

## 2017-03-14 LAB — I-STAT BETA HCG BLOOD, ED (MC, WL, AP ONLY): HCG, QUANTITATIVE: 7.2 m[IU]/mL — AB (ref <5)

## 2017-03-14 MED ORDER — MORPHINE SULFATE (PF) 4 MG/ML IV SOLN
4.0000 mg | Freq: Once | INTRAVENOUS | Status: AC
  Administered 2017-03-14: 4 mg via INTRAVENOUS
  Filled 2017-03-14: qty 1

## 2017-03-14 MED ORDER — SODIUM CHLORIDE 0.9 % IV BOLUS (SEPSIS)
1000.0000 mL | Freq: Once | INTRAVENOUS | Status: AC
  Administered 2017-03-14: 1000 mL via INTRAVENOUS

## 2017-03-14 NOTE — ED Triage Notes (Signed)
Pt presents with R flank pain; denies abd pain, dysuria

## 2017-03-14 NOTE — ED Provider Notes (Signed)
[facility] EMERGENCY DEPARTMENT Provider Note   CSN: [id number] Arrival date & time: 03/14/17  1752     History   Chief Complaint Chief Complaint  Patient presents with  . Flank Pain    HPI Mandy Patel is a 22 y.o. female.  HPI   Pt is a 22 y/o female with h/o T1DM who presents to the ED c/o right flank pain that began 3 days ago, but acutely worsened today. Pain is waxing and waning, and is a sharp in nature. Movement and deep inspiration make the pain worse.  She denies any abdominal pain, pelvic pain, vaginal bleeding, vaginal discharge. Denies urinary sxs or fevers. States she has been eating and drinking with no issue. Has tried 600 mg ibuprofen with no relief.  She denies any other symptoms besides the flank pain. She denies chest pain, palpitations, sob, cough or continued flu sxs. Denies lightheadedness or shortness of breath.   Of note, Pt was seen in the ED yesterday and CT renal study completed which showed no evidence of nephrolithiasis or ureterolithiasis.  Also with normal appendix.  Exam felt somewhat difficult to interpret due to no IV or oral contrast and limited intra-abdominal fat. Pt was felt to have possible pyelonephritis and was given a dose of IV rocephin and started on Cephalexin. She has had one dose today.   States she was also recently admitted to the hospital on 2/2 for influenza and DKA. She was discharged on 2/4. States she was ambulatory and tried to be active during her stay. LMP was 03/05/17. Normal menses and lasted 6 days. Was not heavier than normal. Denies blood in stool or dark tarry stools. No hematuria.  Past Medical History:  Diagnosis Date  . Diabetes mellitus    type I, diagnosed age 51yo    Patient Active Problem List   Diagnosis Date Noted  . Influenza A 03/06/2017  . Vomiting 03/06/2017  . DKA (diabetic ketoacidoses) (HCC) 06/12/2015  . Type I (juvenile type) diabetes mellitus without mention of complication,  uncontrolled 04/26/2011  . Preventative health care 12/01/2010  . Obesity 06/19/2010  . Goiter, unspecified 06/19/2010  . HYPERCHOLESTEROLEMIA 04/25/2008    Past Surgical History:  Procedure Laterality Date  . EYE MUSCLE SURGERY  approx 2010   bilat eye surgury, left exotropia worse;Dr Maple Hudson, opthomology    OB History    No data available       Home Medications    Prior to Admission medications   Medication Sig Start Date End Date Taking? Authorizing Provider  cephALEXin (KEFLEX) 500 MG capsule Take 1 capsule (500 mg total) by mouth 4 (four) times daily. Patient taking differently: Take 500 mg by mouth 4 (four) times daily. 10 day course started 03/13/17 pm 03/13/17  Yes Ward, Chase Picket, PA-C  etonogestrel (NEXPLANON) 68 MG IMPL implant 1 each by Subdermal route once. Implanted Sept 2016   Yes [provider]  ibuprofen (ADVIL,MOTRIN) 800 MG tablet Take 1 tablet (800 mg total) by mouth 3 (three) times daily. 03/13/17  Yes Ward, Chase Picket, PA-C  insulin aspart (NOVOLOG FLEXPEN) 100 UNIT/ML FlexPen Inject 4-10 Units into the skin 3 (three) times daily with meals. Based on carb count and CBG   Yes [provider]  Insulin Detemir (LEVEMIR FLEXTOUCH) 100 UNIT/ML Pen Inject 30 Units into the skin at bedtime.    Yes [provider]  albuterol (PROVENTIL HFA;VENTOLIN HFA) 108 (90 Base) MCG/ACT inhaler Inhale 2 puffs into the lungs every  6 (six) hours as needed for wheezing or shortness of breath. Patient not taking: Reported on 03/14/2017 03/05/17   Faythe Ghee, PA-C  insulin aspart (NOVOLOG) 100 UNIT/ML injection Inject 9 Units into the skin 3 (three) times daily before meals. Patient is dosed based on a sliding scale. Patient not taking: Reported on 03/14/2017 06/16/15   Katha Hamming, MD  insulin glargine (LANTUS) 100 unit/mL SOPN Inject 0.3 mLs (30 Units total) into the skin at bedtime. Patient not taking: Reported on 03/13/2017 06/16/15   Katha Hamming, MD    Family History Family History  Problem Relation Age of Onset  . Alcohol abuse Other   . Arthritis Other   . Hyperlipidemia Other   . Stroke Other   . Diabetes Other   . Hyperlipidemia Other   . Hypertension Other   . Diabetes Other     Social History Social History   Tobacco Use  . Smoking status: Never Smoker  . Smokeless tobacco: Never Used  Substance Use Topics  . Alcohol use: No  . Drug use: No     Allergies   Peanut oil and Other   Review of Systems Review of Systems  Constitutional: Negative for chills and fever.  HENT: Negative for congestion, postnasal drip, rhinorrhea and sore throat.   Eyes: Negative for pain and visual disturbance.  Respiratory: Negative for cough and shortness of breath.   Cardiovascular: Negative for chest pain, palpitations and leg swelling.  Gastrointestinal: Negative for abdominal pain, anal bleeding, constipation, diarrhea, nausea and vomiting.  Genitourinary: Positive for flank pain. Negative for dysuria, frequency, hematuria, pelvic pain, urgency, vaginal bleeding and vaginal discharge.  Musculoskeletal: Negative for arthralgias and back pain.  Skin: Negative for color change and rash.  Neurological: Negative for seizures and syncope.  All other systems reviewed and are negative.    Physical Exam Updated Vital Signs BP 101/64   Pulse (!) 103   Temp 98.7 F (37.1 C) (Oral)   Resp 18   LMP 03/05/2017   SpO2 98%   Physical Exam  Constitutional: She is oriented to person, place, and time. She appears well-developed and well-nourished.  Pt in mild distress  HENT:  Head: Normocephalic and atraumatic.  Nose: Nose normal.  Mouth/Throat: Oropharynx is clear and moist.  Eyes: Conjunctivae are normal. Pupils are equal, round, and reactive to light.  Neck: Normal range of motion. Neck supple.  Cardiovascular: Regular rhythm, normal heart sounds and intact distal pulses.  No murmur heard. Tachycardic to 110  on monitor, regular  Pulmonary/Chest: Effort normal and breath sounds normal. No respiratory distress.  Abdominal: Soft. Bowel sounds are normal. She exhibits no distension. There is no tenderness. There is no guarding.  Mild RUQ TTP. Right CVA TTP. Negative murphys sign. No RLQ tenderness and no pelvic TTP bilaterally  Genitourinary:  Genitourinary Comments: Rectal exam completed and hemoccult obtained was negative. No gross blood on exam. No masses or hemorrhoids noted. Normal light brown stool in rectal vault.   Musculoskeletal: She exhibits no edema.  No calf TTP, erythema, swelling.  Neurological: She is alert and oriented to person, place, and time.  Skin: Skin is warm and dry.  Psychiatric: She has a normal mood and affect.  Nursing note and vitals reviewed.    ED Treatments / Results  Labs (all labs ordered are listed, but only abnormal results are displayed) Labs Reviewed  CBC - Abnormal; Notable for the following components:      Result Value   WBC  18.7 (*)    RBC 3.15 (*)    Hemoglobin 9.9 (*)    HCT 29.6 (*)    All other components within normal limits  COMPREHENSIVE METABOLIC PANEL - Abnormal; Notable for the following components:   Sodium 133 (*)    CO2 16 (*)    Glucose, Bld 312 (*)    Calcium 8.7 (*)    Albumin 3.0 (*)    AST 12 (*)    All other components within normal limits  URINALYSIS, ROUTINE W REFLEX MICROSCOPIC - Abnormal; Notable for the following components:   Color, Urine STRAW (*)    Glucose, UA >=500 (*)    Hgb urine dipstick SMALL (*)    Ketones, ur 80 (*)    Squamous Epithelial / LPF 0-5 (*)    All other components within normal limits  I-STAT BETA HCG BLOOD, ED ([facility], [facility], [facility] ONLY) - Abnormal; Notable for the following components:   I-stat hCG, quantitative 7.2 (*)    All other components within normal limits  I-STAT VENOUS BLOOD GAS, ED - Abnormal; Notable for the following components:   pCO2, Ven 28.0 (*)    pO2, Ven 64.0 (*)    Bicarbonate  12.3 (*)    TCO2 13 (*)    Acid-base deficit 14.0 (*)    All other components within normal limits  LIPASE, BLOOD  COMPREHENSIVE METABOLIC PANEL  POC URINE PREG, ED  POC OCCULT BLOOD, ED    EKG  EKG Interpretation None       Radiology Ct Renal Stone Study  Result Date: 03/13/2017 CLINICAL DATA:  Two days of RIGHT flank pain EXAM: CT ABDOMEN AND PELVIS WITHOUT CONTRAST TECHNIQUE: Multidetector CT imaging of the abdomen and pelvis was performed following the standard protocol without IV contrast. COMPARISON:  Ultrasound 05/19/1998 FINDINGS: Lower chest: Lung bases are clear. Hepatobiliary: No focal hepatic lesion. No biliary duct dilatation. Gallbladder is normal. Common bile duct is normal. Pancreas: Pancreas is normal. No ductal dilatation. No pancreatic inflammation. Spleen: Normal spleen Adrenals/urinary tract: Adrenal glands difficult to identify. No nephrolithiasis or ureterolithiasis. No obstructive uropathy. No bladder calculi Stomach/Bowel: Stomach, small bowel, appendix, and cecum are normal. The colon and rectosigmoid colon are normal. The colon and rectosigmoid colon are normal. Vascular/Lymphatic: Abdominal aorta is normal caliber. There is no retroperitoneal or periportal lymphadenopathy. No pelvic lymphadenopathy. Reproductive: Uterus and ovaries normal Other: No free fluid. Musculoskeletal: No aggressive osseous lesion. IMPRESSION: 1. No nephrolithiasis or ureterolithiasis. 2. Normal appendix. 3. Exam somewhat difficult interpret due to the no IV or oral contrast and limited intra-abdominal fat. Electronically Signed   By: Genevive Bi M.D.   On: 03/13/2017 12:39   US Abdomen Limited Ruq  Result Date: 03/15/2017 CLINICAL DATA:  Acute onset of right upper quadrant abdominal pain. EXAM: ULTRASOUND ABDOMEN LIMITED RIGHT UPPER QUADRANT COMPARISON:  CT of the abdomen and pelvis performed 03/13/2017 FINDINGS: Gallbladder: No gallstones or wall thickening visualized. No sonographic  Murphy sign noted by sonographer. Common bile duct: Diameter: 0.3 cm, within normal limits in caliber. Liver: No focal lesion identified. Within normal limits in parenchymal echogenicity. Portal vein is patent on color Doppler imaging with normal direction of blood flow towards the liver. Note is made of minimal right-sided hydronephrosis. This could reflect a distal obstructing stone, not well seen on recent CT. No definite findings of pyelonephritis are seen. IMPRESSION: 1. No acute abnormality seen at the right upper quadrant. The gallbladder is unremarkable in appearance. 2. Minimal right-sided hydronephrosis. This could  reflect a distal obstructing stone, not well seen on recent CT. No definite evidence for pyelonephritis. Electronically Signed   By: Roanna RaiderJeffery  Chang M.D.   On: 03/15/2017 01:09    Procedures Procedures (including critical care time)  Medications Ordered in ED Medications  morphine 4 MG/ML injection 4 mg (4 mg Intravenous Given 03/14/17 2335)  sodium chloride 0.9 % bolus 1,000 mL (0 mLs Intravenous Stopped 03/15/17 0059)  sodium chloride 0.9 % bolus 1,000 mL (1,000 mLs Intravenous New Bag/Given 03/15/17 0138)  HYDROmorphone (DILAUDID) injection 0.5 mg (0.5 mg Intravenous Given 03/15/17 0138)     Initial Impression / Assessment and Plan / ED Course  I have reviewed the triage vital signs and the nursing notes.  Pertinent labs & imaging results that were available during my care of the patient were reviewed by me and considered in my medical decision making (see chart for details).   Evaluated pt. She is mildly tachycardic to 105. BP normal and she is afebrile. States tachycardia is baseline for her and has no cp, sob or palpitations. No calf swelling, redness, or pain.  Test results of the patient's CT scan yesterday discussed with Dr Warner MccreedyJeff Chang from radiology.  He states that arrow on CT scan was likely indicating a normal appendix.  He does endorse that there is some stranding  around the kidneys and believes that this is suspicious of pyelonephritis if this correlates with symptoms patient's symptoms clinically.  Does not feel that patient needs contrast CT to further evaluate this.  Rechecked patient.  She is still tachycardic to 110 after fluids.  Denies chest pain or shortness of breath.  Sleeping comfortably in bed.  Discussed plan for right upper quadrant ultrasound patient is in agreement plan.  Discussed plan for repeat labs as well and plan for signout to oncoming PA.  Patient understands the plan and has no further questions.  Final Clinical Impressions(s) / ED Diagnoses   Final diagnoses:  Right upper quadrant abdominal pain   22 y/o F presenting with right flank and RUQ abd pain. Was seen in ED yesterday and had negative CT renal study completed. States pain is worse and how has RUQ pain. Will treat pain, give fluids, recheck labs and urine, and order RUQ abdominal US to rule out biliary cause.  CBC with elevated white count to 18.7 however improved from 2 days ago from 19.5. Hgb 9.9 today. Has dropped from 17.3, however pt was in DKA and was dehydrated when this lab was drawn. Review of prior notes suggests that pts baseline is closer to 10/11, so this is likely chronic for her and do not represent acute ongoing blood loss. She did recently have her menses last week and it was normal. No continued vaginal bleeding, negative hemoccult. Safe to f/u as an outpatient for further management of low hgb. CMP with low CO2 to 16 and increased glucose to 312. Lipase normal. Stat bcg abnormal, likely false positive as pt had negative bhcg 1 day ago. Will obtain poc urine pregnancy test. UA with glucosuria, ketonuria and hematuria.  RUQ US is currently pending. Will order additional fluid bolus, and repeat CMET and VBG to rule out DKA. If RUQ US negative and repeat labs are reassuring, than patient can likely be discharged with current plan to complete course of antibiotics  for suspected pyelonephritis. If repeat labs show DKA and/or RUQ US shows acute pathology, then pt will need to be treated and dispositioned accordingly. Discussed this plan with oncoming provider  Etta Grandchild, PA as well as with Dr. Manus Gunning. [place] understands the plan and agrees to f/u on labs and imaging and make a disposition decision.  ED Discharge Orders    None       Karrie Meres, PA-C 03/15/17 0222    [address] Zyann Mabry S, PA-C 03/15/17 1610    Glynn Octave, MD 03/15/17 [phone]

## 2017-03-15 ENCOUNTER — Other Ambulatory Visit: Payer: Self-pay

## 2017-03-15 ENCOUNTER — Emergency Department (HOSPITAL_COMMUNITY): Payer: 59

## 2017-03-15 ENCOUNTER — Inpatient Hospital Stay (HOSPITAL_COMMUNITY): Payer: 59

## 2017-03-15 ENCOUNTER — Encounter (HOSPITAL_COMMUNITY): Payer: Self-pay | Admitting: Internal Medicine

## 2017-03-15 DIAGNOSIS — N12 Tubulo-interstitial nephritis, not specified as acute or chronic: Secondary | ICD-10-CM | POA: Diagnosis present

## 2017-03-15 DIAGNOSIS — R109 Unspecified abdominal pain: Secondary | ICD-10-CM | POA: Diagnosis present

## 2017-03-15 DIAGNOSIS — E1065 Type 1 diabetes mellitus with hyperglycemia: Secondary | ICD-10-CM | POA: Diagnosis present

## 2017-03-15 DIAGNOSIS — N10 Acute tubulo-interstitial nephritis: Secondary | ICD-10-CM | POA: Diagnosis present

## 2017-03-15 LAB — URINALYSIS, ROUTINE W REFLEX MICROSCOPIC
Bacteria, UA: NONE SEEN
Bilirubin Urine: NEGATIVE
Ketones, ur: 80 mg/dL — AB
Leukocytes, UA: NEGATIVE
NITRITE: NEGATIVE
PH: 6 (ref 5.0–8.0)
Protein, ur: NEGATIVE mg/dL
SPECIFIC GRAVITY, URINE: 1.024 (ref 1.005–1.030)

## 2017-03-15 LAB — RAPID URINE DRUG SCREEN, HOSP PERFORMED
AMPHETAMINES: NOT DETECTED
BENZODIAZEPINES: NOT DETECTED
Barbiturates: NOT DETECTED
COCAINE: NOT DETECTED
OPIATES: POSITIVE — AB
TETRAHYDROCANNABINOL: POSITIVE — AB

## 2017-03-15 LAB — BASIC METABOLIC PANEL
Anion gap: 15 (ref 5–15)
CHLORIDE: 107 mmol/L (ref 101–111)
CO2: 12 mmol/L — AB (ref 22–32)
Calcium: 8.3 mg/dL — ABNORMAL LOW (ref 8.9–10.3)
Creatinine, Ser: 0.64 mg/dL (ref 0.44–1.00)
GFR calc Af Amer: >60 mL/min (ref >60)
GFR calc non Af Amer: >60 mL/min (ref >60)
GLUCOSE: 246 mg/dL — AB (ref 65–99)
Potassium: 4.1 mmol/L (ref 3.5–5.1)
Sodium: 134 mmol/L — ABNORMAL LOW (ref 135–145)

## 2017-03-15 LAB — COMPREHENSIVE METABOLIC PANEL
ALBUMIN: 2.8 g/dL — AB (ref 3.5–5.0)
ALK PHOS: 106 U/L (ref 38–126)
ALT: 25 U/L (ref 14–54)
ALT: 27 U/L (ref 14–54)
ANION GAP: 14 (ref 5–15)
ANION GAP: 14 (ref 5–15)
AST: 12 U/L — ABNORMAL LOW (ref 15–41)
AST: 19 U/L (ref 15–41)
Albumin: 3 g/dL — ABNORMAL LOW (ref 3.5–5.0)
Alkaline Phosphatase: 98 U/L (ref 38–126)
BILIRUBIN TOTAL: 0.5 mg/dL (ref 0.3–1.2)
BILIRUBIN TOTAL: 0.9 mg/dL (ref 0.3–1.2)
BUN: 6 mg/dL (ref 6–20)
BUN: <5 mg/dL — ABNORMAL LOW (ref 6–20)
CALCIUM: 8.3 mg/dL — AB (ref 8.9–10.3)
CALCIUM: 8.7 mg/dL — AB (ref 8.9–10.3)
CO2: 15 mmol/L — ABNORMAL LOW (ref 22–32)
CO2: 16 mmol/L — ABNORMAL LOW (ref 22–32)
Chloride: 103 mmol/L (ref 101–111)
Chloride: 107 mmol/L (ref 101–111)
Creatinine, Ser: 0.61 mg/dL (ref 0.44–1.00)
Creatinine, Ser: 0.66 mg/dL (ref 0.44–1.00)
GFR calc non Af Amer: >60 mL/min (ref >60)
GFR calc non Af Amer: >60 mL/min (ref >60)
GLUCOSE: 213 mg/dL — AB (ref 65–99)
Glucose, Bld: 312 mg/dL — ABNORMAL HIGH (ref 65–99)
Potassium: 3.9 mmol/L (ref 3.5–5.1)
Potassium: 3.9 mmol/L (ref 3.5–5.1)
SODIUM: 133 mmol/L — AB (ref 135–145)
Sodium: 136 mmol/L (ref 135–145)
TOTAL PROTEIN: 6.3 g/dL — AB (ref 6.5–8.1)
TOTAL PROTEIN: 6.8 g/dL (ref 6.5–8.1)

## 2017-03-15 LAB — LIPASE, BLOOD: Lipase: 15 U/L (ref 11–51)

## 2017-03-15 LAB — I-STAT VENOUS BLOOD GAS, ED
ACID-BASE DEFICIT: 14 mmol/L — AB (ref 0.0–2.0)
BICARBONATE: 12.3 mmol/L — AB (ref 20.0–28.0)
O2 SAT: 89 %
PO2 VEN: 64 mmHg — AB (ref 32.0–45.0)
TCO2: 13 mmol/L — ABNORMAL LOW (ref 22–32)
pCO2, Ven: 28 mmHg — ABNORMAL LOW (ref 44.0–60.0)
pH, Ven: 7.251 (ref 7.250–7.430)

## 2017-03-15 LAB — CBG MONITORING, ED
Glucose-Capillary: 231 mg/dL — ABNORMAL HIGH (ref 65–99)
Glucose-Capillary: 164 mg/dL — ABNORMAL HIGH (ref 65–99)
Glucose-Capillary: 133 mg/dL — ABNORMAL HIGH (ref 65–99)

## 2017-03-15 LAB — GLUCOSE, CAPILLARY
GLUCOSE-CAPILLARY: 123 mg/dL — AB (ref 65–99)
Glucose-Capillary: 98 mg/dL (ref 65–99)

## 2017-03-15 LAB — POC OCCULT BLOOD, ED: Fecal Occult Bld: NEGATIVE

## 2017-03-15 MED ORDER — KETOROLAC TROMETHAMINE 30 MG/ML IJ SOLN
30.0000 mg | Freq: Once | INTRAMUSCULAR | Status: AC
  Administered 2017-03-15: 30 mg via INTRAVENOUS
  Filled 2017-03-15: qty 1

## 2017-03-15 MED ORDER — ACETAMINOPHEN 325 MG PO TABS: 650.0000 mg | ORAL_TABLET | Freq: Four times a day (QID) | ORAL | Status: DC | PRN

## 2017-03-15 MED ORDER — MORPHINE SULFATE (PF) 4 MG/ML IV SOLN
1.0000 mg | INTRAVENOUS | Status: DC | PRN
  Administered 2017-03-15 (×3): 1 mg via INTRAVENOUS
  Filled 2017-03-15 (×3): qty 1

## 2017-03-15 MED ORDER — ONDANSETRON HCL 4 MG PO TABS: 4.0000 mg | ORAL_TABLET | Freq: Four times a day (QID) | ORAL | Status: DC | PRN

## 2017-03-15 MED ORDER — SODIUM CHLORIDE 0.9 % IV BOLUS (SEPSIS)
1000.0000 mL | Freq: Once | INTRAVENOUS | Status: AC
  Administered 2017-03-15: 1000 mL via INTRAVENOUS

## 2017-03-15 MED ORDER — MORPHINE SULFATE (PF) 4 MG/ML IV SOLN
2.0000 mg | INTRAVENOUS | Status: DC | PRN
  Administered 2017-03-16: 2 mg via INTRAVENOUS
  Filled 2017-03-15: qty 1

## 2017-03-15 MED ORDER — LACTATED RINGERS IV BOLUS (SEPSIS)
1000.0000 mL | Freq: Once | INTRAVENOUS | Status: AC
  Administered 2017-03-15: 1000 mL via INTRAVENOUS

## 2017-03-15 MED ORDER — INSULIN ASPART 100 UNIT/ML ~~LOC~~ SOLN
0.0000 [IU] | Freq: Three times a day (TID) | SUBCUTANEOUS | Status: DC
  Administered 2017-03-15: 1 [IU] via SUBCUTANEOUS
  Administered 2017-03-15: 3 [IU] via SUBCUTANEOUS
  Administered 2017-03-16: 2 [IU] via SUBCUTANEOUS
  Administered 2017-03-16: 5 [IU] via SUBCUTANEOUS
  Administered 2017-03-16: 4 [IU] via SUBCUTANEOUS
  Administered 2017-03-17: 3 [IU] via SUBCUTANEOUS
  Filled 2017-03-15 (×2): qty 1

## 2017-03-15 MED ORDER — ACETAMINOPHEN 650 MG RE SUPP: 650.0000 mg | Freq: Four times a day (QID) | RECTAL | Status: DC | PRN

## 2017-03-15 MED ORDER — INSULIN DETEMIR 100 UNIT/ML ~~LOC~~ SOLN
30.0000 [IU] | Freq: Every day | SUBCUTANEOUS | Status: DC
  Administered 2017-03-15 – 2017-03-17 (×3): 30 [IU] via SUBCUTANEOUS
  Filled 2017-03-15 (×3): qty 0.3

## 2017-03-15 MED ORDER — MORPHINE SULFATE (PF) 4 MG/ML IV SOLN
1.0000 mg | INTRAVENOUS | Status: DC | PRN
  Administered 2017-03-15 (×2): 2 mg via INTRAVENOUS
  Filled 2017-03-15 (×2): qty 1

## 2017-03-15 MED ORDER — PROMETHAZINE HCL 25 MG/ML IJ SOLN
12.5000 mg | Freq: Four times a day (QID) | INTRAMUSCULAR | Status: DC | PRN
  Administered 2017-03-16: 12.5 mg via INTRAVENOUS
  Filled 2017-03-15: qty 1

## 2017-03-15 MED ORDER — INSULIN DETEMIR 100 UNIT/ML ~~LOC~~ SOLN: 30.0000 [IU] | Freq: Every day | SUBCUTANEOUS | Status: DC

## 2017-03-15 MED ORDER — SODIUM CHLORIDE 0.9 % IV SOLN
1.0000 g | INTRAVENOUS | Status: DC
  Administered 2017-03-15 – 2017-03-17 (×3): 1 g via INTRAVENOUS
  Filled 2017-03-15 (×2): qty 10

## 2017-03-15 MED ORDER — HYDROMORPHONE HCL 1 MG/ML IJ SOLN
0.5000 mg | Freq: Once | INTRAMUSCULAR | Status: AC
  Administered 2017-03-15: 0.5 mg via INTRAVENOUS
  Filled 2017-03-15: qty 1

## 2017-03-15 MED ORDER — PROMETHAZINE HCL 25 MG/ML IJ SOLN: 12.5000 mg | Freq: Once | INTRAMUSCULAR | Status: DC

## 2017-03-15 MED ORDER — ONDANSETRON HCL 4 MG/2ML IJ SOLN
4.0000 mg | Freq: Four times a day (QID) | INTRAMUSCULAR | Status: DC | PRN
  Administered 2017-03-15 (×2): 4 mg via INTRAVENOUS
  Filled 2017-03-15 (×3): qty 2

## 2017-03-15 MED ORDER — LACTATED RINGERS IV SOLN
INTRAVENOUS | Status: DC
  Administered 2017-03-15 – 2017-03-16 (×3): via INTRAVENOUS

## 2017-03-15 NOTE — Progress Notes (Signed)
Pharmacy Antibiotic Note  Mandy Patel is a 22 y.o. female admitted on 03/14/2017 with pyelonephritis.  Pharmacy has been consulted for Rocephin dosing.  Plan: Rocephin 1g IV Q24H.     Temp (24hrs), Avg:98.7 F (37.1 C), Min:98.7 F (37.1 C), Max:98.7 F (37.1 C)  Recent Labs  Lab 03/13/17 1143 03/13/17 1341 03/14/17 2248 03/14/17 2331 03/15/17 0133  WBC 19.5*  --  18.7*  --   --   CREATININE 0.55  --   --  0.66 0.61  LATICACIDVEN  --  0.85  --   --   --     Estimated Creatinine Clearance: 112.2 mL/min (by C-G formula based on SCr of 0.61 mg/dL).    Allergies  Allergen Reactions  . Peanut Oil Other (See Comments)    Reaction to peanuts - gums feel numb and tingling  . Other Swelling    Pecans caused throat swelling and weakness     Thank you for allowing pharmacy to be a part of this patient's care.  Mandy Patel, PharmD, BCPS  03/15/2017 5:40 AM

## 2017-03-15 NOTE — H&P (Signed)
History and Physical    Mandy Patel NFA:[id number] DOB: [birth year] DOA: 03/14/2017  PCP: Patient, No Pcp Per  Patient coming from: Home.  Chief Complaint: Right flank pain.  HPI: Mandy Patel is a 22 y.o. female with history of diabetes mellitus type 1 who was recently admitted for DKA and influenza presents to the ER for the second time over the last 24 hours because of persistent right flank pain.  Patient had come on March 13, 2017 and at that time had CT renal study which did not show anything acute and was discharged home on Keflex.  Despite taking which patient was still having persistent pain in the right flank.  Denies any nausea vomiting or diarrhea denies chest pain or shortness of breath patient states she has been taking her Levemir.  ED Course: In the ER patient had abdominal sonogram which shows which is concerning for right-sided hydronephrosis with possible distal obstruction.  ER physician I discussed with Dr. Liliane Shi on-call urologist who at this time feels that patient's symptoms may be from pyelonephritis and to treat for pyelonephritis and if symptoms does not get better to reconsult urology.  Patient's labs show elevated blood sugar with elevated anion gap which improved with fluids but still has low bicarb.  Urine shows ketones.  Review of Systems: As per HPI, rest all negative.   Past Medical History:  Diagnosis Date  . Diabetes mellitus    type I, diagnosed age 14yo    Past Surgical History:  Procedure Laterality Date  . EYE MUSCLE SURGERY  approx 2010   bilat eye surgury, left exotropia worse;Dr Maple Hudson, opthomology     reports that  has never smoked. she has never used smokeless tobacco. She reports that she does not drink alcohol or use drugs.  Allergies  Allergen Reactions  . Peanut Oil Other (See Comments)    Reaction to peanuts - gums feel numb and tingling  . Other Swelling    Pecans caused throat swelling and weakness    Family History    Problem Relation Age of Onset  . Alcohol abuse Other   . Arthritis Other   . Hyperlipidemia Other   . Stroke Other   . Diabetes Other   . Hyperlipidemia Other   . Hypertension Other   . Diabetes Other     Prior to Admission medications   Medication Sig Start Date End Date Taking? Authorizing Provider  cephALEXin (KEFLEX) 500 MG capsule Take 1 capsule (500 mg total) by mouth 4 (four) times daily. Patient taking differently: Take 500 mg by mouth 4 (four) times daily. 10 day course started 03/13/17 pm 03/13/17  Yes Ward, Chase Picket, PA-C  etonogestrel (NEXPLANON) 68 MG IMPL implant 1 each by Subdermal route once. Implanted Sept 2016   Yes [provider]  ibuprofen (ADVIL,MOTRIN) 800 MG tablet Take 1 tablet (800 mg total) by mouth 3 (three) times daily. 03/13/17  Yes Ward, Chase Picket, PA-C  insulin aspart (NOVOLOG FLEXPEN) 100 UNIT/ML FlexPen Inject 4-10 Units into the skin 3 (three) times daily with meals. Based on carb count and CBG   Yes [provider]  Insulin Detemir (LEVEMIR FLEXTOUCH) 100 UNIT/ML Pen Inject 30 Units into the skin at bedtime.    Yes [provider]  albuterol (PROVENTIL HFA;VENTOLIN HFA) 108 (90 Base) MCG/ACT inhaler Inhale 2 puffs into the lungs every 6 (six) hours as needed for wheezing or shortness of breath. Patient not taking: Reported on 03/14/2017 03/05/17   Sherrie Mustache,  Roselyn BeringSusan W, PA-C  insulin aspart (NOVOLOG) 100 UNIT/ML injection Inject 9 Units into the skin 3 (three) times daily before meals. Patient is dosed based on a sliding scale. Patient not taking: Reported on 03/14/2017 06/16/15   Katha HammingKonidena, Snehalatha, MD  insulin glargine (LANTUS) 100 unit/mL SOPN Inject 0.3 mLs (30 Units total) into the skin at bedtime. Patient not taking: Reported on 03/13/2017 06/16/15   Katha HammingKonidena, Snehalatha, MD    Physical Exam: Vitals:   03/15/17 0415 03/15/17 0430 03/15/17 0445 03/15/17 0500  BP: 109/68 112/66 110/67 124/78  Pulse: (!) 102 100 (!) 101 (!)  113  Resp:      Temp:      TempSrc:      SpO2: 98% 98% 99% 100%      Constitutional: Moderately built and nourished. Vitals:   03/15/17 0415 03/15/17 0430 03/15/17 0445 03/15/17 0500  BP: 109/68 112/66 110/67 124/78  Pulse: (!) 102 100 (!) 101 (!) 113  Resp:      Temp:      TempSrc:      SpO2: 98% 98% 99% 100%   Eyes: Anicteric no pallor. ENMT: No discharge from the ears eyes nose or mouth. Neck: No mass felt.  No neck rigidity but no JVD. Respiratory: No rhonchi or crepitations. Cardiovascular: S1-S2 heard no murmurs appreciated. Abdomen: Soft mild right flank tenderness.  No guarding or rigidity. Musculoskeletal: No edema.  No joint effusion. Skin: No rash.  Skin appears warm. Neurologic: Alert awake oriented to time place and person.  Moves all extremities. Psychiatric: Appears normal.  Normal affect.   Labs on Admission: I have personally reviewed following labs and imaging studies  CBC: Recent Labs  Lab 03/13/17 1143 03/14/17 2248  WBC 19.5* 18.7*  NEUTROABS 13.9*  --   HGB 10.6* 9.9*  HCT 32.4* 29.6*  MCV 94.2 94.0  PLT 239 256   Basic Metabolic Panel: Recent Labs  Lab 03/13/17 1143 03/14/17 2331 03/15/17 0133  NA 131* 133* 136  K 3.0* 3.9 3.9  CL 95* 103 107  CO2 24 16* 15*  GLUCOSE 286* 312* 213*  BUN <5* 6 <5*  CREATININE 0.55 0.66 0.61  CALCIUM 8.2* 8.7* 8.3*   GFR: Estimated Creatinine Clearance: 112.2 mL/min (by C-G formula based on SCr of 0.61 mg/dL). Liver Function Tests: Recent Labs  Lab 03/13/17 1143 03/14/17 2331 03/15/17 0133  AST 19 12* 19  ALT 21 25 27   ALKPHOS 82 106 98  BILITOT 0.8 0.9 0.5  PROT 7.8 6.8 6.3*  ALBUMIN 3.1* 3.0* 2.8*   Recent Labs  Lab 03/14/17 2331  LIPASE 15   No results for input(s): AMMONIA in the last 168 hours. Coagulation Profile: No results for input(s): INR, PROTIME in the last 168 hours. Cardiac Enzymes: No results for input(s): CKTOTAL, CKMB, CKMBINDEX, TROPONINI in the last 168  hours. BNP (last 3 results) No results for input(s): PROBNP in the last 8760 hours. HbA1C: No results for input(s): HGBA1C in the last 72 hours. CBG: No results for input(s): GLUCAP in the last 168 hours. Lipid Profile: No results for input(s): CHOL, HDL, LDLCALC, TRIG, CHOLHDL, LDLDIRECT in the last 72 hours. Thyroid Function Tests: No results for input(s): TSH, T4TOTAL, FREET4, T3FREE, THYROIDAB in the last 72 hours. Anemia Panel: No results for input(s): VITAMINB12, FOLATE, FERRITIN, TIBC, IRON, RETICCTPCT in the last 72 hours. Urine analysis:    Component Value Date/Time   COLORURINE STRAW (A) 03/15/2017 0116   APPEARANCEUR CLEAR 03/15/2017 0116   LABSPEC 1.024 03/15/2017  0116   PHURINE 6.0 03/15/2017 0116   GLUCOSEU >=500 (A) 03/15/2017 0116   GLUCOSEU >=1000 12/01/2010 1051   HGBUR SMALL (A) 03/15/2017 0116   BILIRUBINUR NEGATIVE 03/15/2017 0116   KETONESUR 80 (A) 03/15/2017 0116   PROTEINUR NEGATIVE 03/15/2017 0116   UROBILINOGEN 0.2 12/01/2010 1051   NITRITE NEGATIVE 03/15/2017 0116   LEUKOCYTESUR NEGATIVE 03/15/2017 0116   Sepsis Labs: @LABRCNTIP (procalcitonin:4,lacticidven:4) ) Recent Results (from the past 240 hour(s))  MRSA PCR Screening     Status: None   Collection Time: 03/06/17  1:04 PM  Result Value Ref Range Status   MRSA by PCR NEGATIVE NEGATIVE Final    Comment:        The GeneXpert MRSA Assay (FDA approved for NASAL specimens only), is one component of a comprehensive MRSA colonization surveillance program. It is not intended to diagnose MRSA infection nor to guide or monitor treatment for MRSA infections. Performed at [facility], [address], [place], [place] [address]   Urine Culture     Status: Abnormal   Collection Time: 03/13/17  4:11 PM  Result Value Ref Range Status   Specimen Description URINE, CLEAN CATCH  Final   Special Requests NONE  Final   Culture (A)  Final    <10,000 COLONIES/mL INSIGNIFICANT  GROWTH Performed at [facility], [address] [place], [place] [address]    Report Status 03/14/2017 FINAL  Final     Radiological Exams on Admission: Ct Renal Stone Study  Result Date: 03/13/2017 CLINICAL DATA:  Two days of RIGHT flank pain EXAM: CT ABDOMEN AND PELVIS WITHOUT CONTRAST TECHNIQUE: Multidetector CT imaging of the abdomen and pelvis was performed following the standard protocol without IV contrast. COMPARISON:  Ultrasound 05/19/1998 FINDINGS: Lower chest: Lung bases are clear. Hepatobiliary: No focal hepatic lesion. No biliary duct dilatation. Gallbladder is normal. Common bile duct is normal. Pancreas: Pancreas is normal. No ductal dilatation. No pancreatic inflammation. Spleen: Normal spleen Adrenals/urinary tract: Adrenal glands difficult to identify. No nephrolithiasis or ureterolithiasis. No obstructive uropathy. No bladder calculi Stomach/Bowel: Stomach, small bowel, appendix, and cecum are normal. The colon and rectosigmoid colon are normal. The colon and rectosigmoid colon are normal. Vascular/Lymphatic: Abdominal aorta is normal caliber. There is no retroperitoneal or periportal lymphadenopathy. No pelvic lymphadenopathy. Reproductive: Uterus and ovaries normal Other: No free fluid. Musculoskeletal: No aggressive osseous lesion. IMPRESSION: 1. No nephrolithiasis or ureterolithiasis. 2. Normal appendix. 3. Exam somewhat difficult interpret due to the no IV or oral contrast and limited intra-abdominal fat. Electronically Signed   By: Genevive Bi M.D.   On: 03/13/2017 12:39   US Abdomen Limited Ruq  Result Date: 03/15/2017 CLINICAL DATA:  Acute onset of right upper quadrant abdominal pain. EXAM: ULTRASOUND ABDOMEN LIMITED RIGHT UPPER QUADRANT COMPARISON:  CT of the abdomen and pelvis performed 03/13/2017 FINDINGS: Gallbladder: No gallstones or wall thickening visualized. No sonographic Murphy sign noted by sonographer. Common bile duct: Diameter: 0.3 cm, within normal  limits in caliber. Liver: No focal lesion identified. Within normal limits in parenchymal echogenicity. Portal vein is patent on color Doppler imaging with normal direction of blood flow towards the liver. Note is made of minimal right-sided hydronephrosis. This could reflect a distal obstructing stone, not well seen on recent CT. No definite findings of pyelonephritis are seen. IMPRESSION: 1. No acute abnormality seen at the right upper quadrant. The gallbladder is unremarkable in appearance. 2. Minimal right-sided hydronephrosis. This could reflect a distal obstructing stone, not well seen on recent  CT. No definite evidence for pyelonephritis. Electronically Signed   By: Roanna Raider M.D.   On: 03/15/2017 01:09     Assessment/Plan Active Problems:   Type 1 diabetes mellitus with hyperglycemia (HCC)   Acute pyelonephritis   Pyelonephritis    1. Right flank pain possibly from right-sided pyelonephritis -patient has been placed on ceftriaxone follow cultures.  3 with hydration.  Pain relief medications.  Since sonogram shows possible hydronephrosis with possible distal obstruction if symptoms does not get better please reconsult urology. 2. Possible developing DKA -patient states she did take her Levemir last night.  There was mild elevated anion gap initially.  I have given more fluids at this time and will recheck metabolic panel and if anion gap is still elevated will need to start outpatient on IV insulin infusion for DKA.   DVT prophylaxis: SCDs in anticipation of possible procedure. Code Status: Full code. Family Communication: Discussed with patient. Disposition Plan: Home. Consults called: ER physician discussed with urologist Dr. Liliane Shi. Admission status: Observation.   Eduard Clos MD [facility] Pager [phone].  If 7PM-7AM, please contact night-coverage [email or web address] Password [facility]  03/15/2017, 5:34 AM

## 2017-03-15 NOTE — ED Notes (Signed)
RN called diabetes coordinator, Carollee HerterShannon, and discussed patient. She reviewed chart and is recommending 1 L more of fluids and Levemir now. She is to contact MD to discuss recommendations and seek changes in orders in order to prevent possible DKA.

## 2017-03-15 NOTE — Progress Notes (Signed)
  PROGRESS NOTE  Patient admitted earlier this morning. See H&P. Mandy Patel is a 22 y.o. female with history of diabetes mellitus type 1 who was recently admitted for DKA and influenza presents to the ER for the second time over the last 24 hours because of persistent right flank pain.  Patient had come on March 13, 2017 and at that time had CT renal study which did not show anything acute and was discharged home on Keflex.  Despite taking the antibiotic, patient was still having persistent pain in the right flank. In the ER, patient had abdominal sonogram which was concerning for right-sided hydronephrosis with possible distal obstruction.  ER physician discussed with Dr. Liliane ShiWinter on-call urologist who at this time felt that patient's symptoms may be from pyelonephritis and to treat for pyelonephritis and if symptoms does not get better to reconsult urology.  Patient's labs show elevated blood sugar with elevated anion gap.  Urine pregnancy test negative  Will treat as pyelonephritis with rocephin, monitor closely  Pain control Antiemetic  Patient has no urinary complaints, has been voiding without issue Levemir and SSI. Discussed with diabetic coordinator.  IVF  Trend labs  Check pelvic ultrasound, rule out ovarian cause. Patient's pain out of proportion to minimal right sided hydronephrosis seen on RUQ US.    Noralee StainJennifer Jeury Mcnab, DO [facility] [email or web address] Password [email or web address] 03/15/2017, 12:04 PM

## 2017-03-15 NOTE — ED Notes (Signed)
POC urine pregnancy test negative. Unable to crossover to results tab at this time. Verified with mini-lab.

## 2017-03-15 NOTE — ED Notes (Signed)
Pt had 2 episodes of vomiting. Friend at bedside comforting her. She states it is easing off now. Educated her that likely side effect from morphine and zofran will hopefull help.

## 2017-03-15 NOTE — ED Notes (Signed)
Carb Modified Diet Ordered for Lunch. 

## 2017-03-15 NOTE — ED Notes (Signed)
Patient was asleep; now awake and tachycardic and moaning. Pain med given.

## 2017-03-15 NOTE — ED Notes (Signed)
Report given; no questions. Being transported upstairs.

## 2017-03-15 NOTE — ED Provider Notes (Signed)
Patient signed out at end of shift by Peter Kiewit SonsCortni Couture, PA-C, pending US RUQ to evaluate for worsening, previously evaluated right side pain. She reports:  Rt flank pain evaluated yesterday in ED; had CT stone study that was negative, appendix incidentally negative ?pyelo - home on keflex Now with increased/severe right flank and (new) RUQ pain today US pending She has gotten fluids Initial CBG 213, no gap, CO2 16 Also has drop in hgb from 17 (while in DKA 03/06/17) to 9.9, appears to be baseline Hemoccult negative Patient's pain temporarily improved with Morphine.    Plan: fluids, pending US, repeat Cmet and VBG  Recheck: Patient is having severe pain. Dilaudid 0.5 mg ordered.   Recheck: pain is resolved. Reviewed labs. Potassium improved from visit one day ago. She has a leukocytosis of 18.5. Bicarb of 16 with normal gap and normal pH. UA with keytones, concentrated, 6-30 WBCs.   US negative for findings related to gall bladder. Mild hydronephrosis. Per radiology, re-consider possible obstructing stone. Discussed with Dr. Liliane ShiWinter (urology) who is comfortable with CT scan results finding no stone and she should be treated as a pyelonephritis, with urology consult if no better with antibiotics.   Discussed with Dr. Toniann FailKakrakandy who accepts the patient for admission. Patient updated on plan and is comfortable with admission. She continues to be pain free with medications.     Elpidio AnisUpstill, Jahon Bart, PA-C 03/15/17 0254    Glynn Octaveancour, Stephen, MD 03/15/17 [phone]

## 2017-03-15 NOTE — ED Notes (Signed)
Pt resting.

## 2017-03-15 NOTE — ED Notes (Signed)
MD at bedside. 

## 2017-03-15 NOTE — ED Notes (Signed)
Patient to US

## 2017-03-16 LAB — BASIC METABOLIC PANEL
ANION GAP: 14 (ref 5–15)
CHLORIDE: 106 mmol/L (ref 101–111)
CO2: 13 mmol/L — ABNORMAL LOW (ref 22–32)
Calcium: 8.4 mg/dL — ABNORMAL LOW (ref 8.9–10.3)
Creatinine, Ser: 0.5 mg/dL (ref 0.44–1.00)
GFR calc non Af Amer: >60 mL/min (ref >60)
Glucose, Bld: 146 mg/dL — ABNORMAL HIGH (ref 65–99)
POTASSIUM: 3.2 mmol/L — AB (ref 3.5–5.1)
SODIUM: 133 mmol/L — AB (ref 135–145)

## 2017-03-16 LAB — GLUCOSE, CAPILLARY
GLUCOSE-CAPILLARY: 129 mg/dL — AB (ref 65–99)
GLUCOSE-CAPILLARY: 275 mg/dL — AB (ref 65–99)
GLUCOSE-CAPILLARY: 288 mg/dL — AB (ref 65–99)
Glucose-Capillary: 171 mg/dL — ABNORMAL HIGH (ref 65–99)

## 2017-03-16 LAB — CBC
HCT: 27.4 % — ABNORMAL LOW (ref 36.0–46.0)
HEMOGLOBIN: 9.1 g/dL — AB (ref 12.0–15.0)
MCH: 30.8 pg (ref 26.0–34.0)
MCHC: 33.2 g/dL (ref 30.0–36.0)
MCV: 92.9 fL (ref 78.0–100.0)
Platelets: 319 10*3/uL (ref 150–400)
RBC: 2.95 MIL/uL — AB (ref 3.87–5.11)
RDW: 12.8 % (ref 11.5–15.5)
WBC: 15 10*3/uL — AB (ref 4.0–10.5)

## 2017-03-16 LAB — LACTIC ACID, PLASMA: LACTIC ACID, VENOUS: 0.9 mmol/L (ref 0.5–1.9)

## 2017-03-16 MED ORDER — SODIUM CHLORIDE 0.9 % IV SOLN
INTRAVENOUS | Status: DC
  Administered 2017-03-16: 09:00:00 via INTRAVENOUS

## 2017-03-16 MED ORDER — POTASSIUM CHLORIDE CRYS ER 20 MEQ PO TBCR
40.0000 meq | EXTENDED_RELEASE_TABLET | Freq: Once | ORAL | Status: AC
  Administered 2017-03-16: 40 meq via ORAL
  Filled 2017-03-16: qty 2

## 2017-03-16 MED ORDER — KETOROLAC TROMETHAMINE 30 MG/ML IJ SOLN
30.0000 mg | Freq: Four times a day (QID) | INTRAMUSCULAR | Status: DC | PRN
  Administered 2017-03-16 – 2017-03-17 (×2): 30 mg via INTRAVENOUS
  Filled 2017-03-16 (×2): qty 1

## 2017-03-16 MED ORDER — INSULIN ASPART 100 UNIT/ML ~~LOC~~ SOLN
4.0000 [IU] | Freq: Once | SUBCUTANEOUS | Status: AC
  Administered 2017-03-16: 4 [IU] via SUBCUTANEOUS

## 2017-03-16 MED ORDER — KETOROLAC TROMETHAMINE 30 MG/ML IJ SOLN
30.0000 mg | Freq: Once | INTRAMUSCULAR | Status: AC
  Administered 2017-03-16: 30 mg via INTRAVENOUS
  Filled 2017-03-16: qty 1

## 2017-03-16 MED ORDER — DEXTROSE-NACL 5-0.45 % IV SOLN
INTRAVENOUS | Status: DC
  Administered 2017-03-16 (×2): via INTRAVENOUS

## 2017-03-16 NOTE — Progress Notes (Signed)
PROGRESS NOTE    Annalissa FRANCHELLE FOSKETT  HQI:[id number] DOB: 15-Oct-1995 DOA: 03/14/2017 PCP: Patient, No Pcp Per     Brief Narrative:  Michele A Allenis a 22 y.o.femalewithhistory of diabetes mellitus type 1 who was recently admitted for DKA and influenza presents to the ER for the second time over the last 24 hours because of persistent right flank pain. Patient had come on March 13, 2017 and at that time had CT renal study which did not show anything acute and was discharged home on Keflex. Despite taking the antibiotic, patient was still having persistent pain in the right flank. In the ER, patient had abdominal sonogram which was concerning for right-sided hydronephrosis with possible distal obstruction. ER physician discussed with Dr. Liliane Shi on-call urologist who at this time felt that patient's symptoms may be from pyelonephritis and to treat for pyelonephritis and if symptoms does not get better to reconsult urology. Patient's labs show elevated blood sugar with elevated anion gap.   Assessment & Plan:   Principal Problem:   Acute right flank pain Active Problems:   Type 1 diabetes mellitus with hyperglycemia (HCC)   Acute pyelonephritis   Acute right flank pain secondary to suspected pyelonephritis and mild hydronephrosis -Urine pregnancy test negative -EDP spoke with urology and recommended to treat pyelo  -Urine culture from 2/10 with insignificant growth -Continue rocephin  -Pain resolved today, WBC improving   Early DKA, type 1 -Continues to have metabolic acidosis with elevated anion gap. Discussed with DM coordinator, will switch IVF to D5 0.45NS, continue levemir and SSI   Hypokalemia -Replace, trend    DVT prophylaxis: SCD Code Status: Full Family Communication: Friend at bedside Disposition Plan: Pending improvement in acidosis   Consultants:   None  Procedures:   None  Antimicrobials:  Anti-infectives (From admission, onward)   Start      Dose/Rate Route Frequency Ordered Stop   03/15/17 0600  cefTRIAXone (ROCEPHIN) 1 g in sodium chloride 0.9 % 100 mL IVPB     1 g 200 mL/hr over 30 Minutes Intravenous Every 24 hours 03/15/17 0541          Subjective: Feeling well today. Right flank pain is now resolved. She is making urine. No complaints. No nausea/vomiting. Ate breakfast without issue.   Objective: Vitals:   03/15/17 1405 03/15/17 1504 03/16/17 0000 03/16/17 0422  BP: 123/69 133/81  114/65  Pulse: (!) 111 (!) 104  (!) 103  Resp: (!) 21 18  16   Temp:  99.4 F (37.4 C)  97.8 F (36.6 C)  TempSrc:  Oral  Oral  SpO2: 100% 100%  100%  Weight:    74.8 kg (164 lb 14.5 oz)  Height:   5\' 8"  (1.727 m)     Intake/Output Summary (Last 24 hours) at 03/16/2017 1206 Last data filed at 03/16/2017 0426 Gross per 24 hour  Intake 1671.67 ml  Output 1700 ml  Net -28.33 ml   Filed Weights   03/16/17 0422  Weight: 74.8 kg (164 lb 14.5 oz)    Examination:  General exam: Appears calm and comfortable  Respiratory system: Clear to auscultation. Respiratory effort normal. Cardiovascular system: S1 & S2 heard, tachycardic, regular rhythm. No JVD, murmurs, rubs, gallops or clicks. No pedal edema. Gastrointestinal system: Abdomen is nondistended, soft and nontender. No organomegaly or masses felt. Normal bowel sounds heard. Central nervous system: Alert and oriented. No focal neurological deficits. Extremities: Symmetric 5 x 5 power. Skin: No rashes, lesions or ulcers Psychiatry: Judgement and insight  appear normal. Mood & affect appropriate.   Data Reviewed: I have personally reviewed following labs and imaging studies  CBC: Recent Labs  Lab 03/13/17 1143 03/14/17 2248 03/16/17 0503  WBC 19.5* 18.7* 15.0*  NEUTROABS 13.9*  --   --   HGB 10.6* 9.9* 9.1*  HCT 32.4* 29.6* 27.4*  MCV 94.2 94.0 92.9  PLT 239 256 319   Basic Metabolic Panel: Recent Labs  Lab 03/13/17 1143 03/14/17 2331 03/15/17 0133 03/15/17 0725  03/16/17 0503  NA 131* 133* 136 134* 133*  K 3.0* 3.9 3.9 4.1 3.2*  CL 95* 103 107 107 106  CO2 24 16* 15* 12* 13*  GLUCOSE 286* 312* 213* 246* 146*  BUN <5* 6 <5* <5* <5*  CREATININE 0.55 0.66 0.61 0.64 0.50  CALCIUM 8.2* 8.7* 8.3* 8.3* 8.4*   GFR: Estimated Creatinine Clearance: 112.2 mL/min (by C-G formula based on SCr of 0.5 mg/dL). Liver Function Tests: Recent Labs  Lab 03/13/17 1143 03/14/17 2331 03/15/17 0133  AST 19 12* 19  ALT 21 25 27   ALKPHOS 82 106 98  BILITOT 0.8 0.9 0.5  PROT 7.8 6.8 6.3*  ALBUMIN 3.1* 3.0* 2.8*   Recent Labs  Lab 03/14/17 2331  LIPASE 15   No results for input(s): AMMONIA in the last 168 hours. Coagulation Profile: No results for input(s): INR, PROTIME in the last 168 hours. Cardiac Enzymes: No results for input(s): CKTOTAL, CKMB, CKMBINDEX, TROPONINI in the last 168 hours. BNP (last 3 results) No results for input(s): PROBNP in the last 8760 hours. HbA1C: No results for input(s): HGBA1C in the last 72 hours. CBG: Recent Labs  Lab 03/15/17 1140 03/15/17 1251 03/15/17 1737 03/15/17 2138 03/16/17 0746  GLUCAP 164* 133* 98 123* 129*   Lipid Profile: No results for input(s): CHOL, HDL, LDLCALC, TRIG, CHOLHDL, LDLDIRECT in the last 72 hours. Thyroid Function Tests: No results for input(s): TSH, T4TOTAL, FREET4, T3FREE, THYROIDAB in the last 72 hours. Anemia Panel: No results for input(s): VITAMINB12, FOLATE, FERRITIN, TIBC, IRON, RETICCTPCT in the last 72 hours. Sepsis Labs: Recent Labs  Lab 03/13/17 1341 03/16/17 0751  LATICACIDVEN 0.85 0.9    Recent Results (from the past 240 hour(s))  MRSA PCR Screening     Status: None   Collection Time: 03/06/17  1:04 PM  Result Value Ref Range Status   MRSA by PCR NEGATIVE NEGATIVE Final    Comment:        The GeneXpert MRSA Assay (FDA approved for NASAL specimens only), is one component of a comprehensive MRSA colonization surveillance program. It is not intended to diagnose  MRSA infection nor to guide or monitor treatment for MRSA infections. Performed at [facility], [address] [place], [place] [address]   Urine Culture     Status: Abnormal   Collection Time: 03/13/17  4:11 PM  Result Value Ref Range Status   Specimen Description URINE, CLEAN CATCH  Final   Special Requests NONE  Final   Culture (A)  Final    <10,000 COLONIES/mL INSIGNIFICANT GROWTH Performed at [facility], [address] [place], [place] [address]    Report Status 03/14/2017 FINAL  Final       Radiology Studies: [place] Pelvic Complete With Transvaginal  Result Date: 03/15/2017 CLINICAL DATA:  Right flank and pelvic pain for 4 days. EXAM: TRANSABDOMINAL AND TRANSVAGINAL ULTRASOUND OF PELVIS TECHNIQUE: Both transabdominal and transvaginal ultrasound examinations of the pelvis were performed. Transabdominal technique was performed for global imaging of  the pelvis including uterus, ovaries, adnexal regions, and pelvic cul-de-sac. It was necessary to proceed with endovaginal exam following the transabdominal exam to visualize the endometrium and ovaries. COMPARISON:  None FINDINGS: Uterus Measurements: 10.3 x 3.5 x 5.8 cm. No fibroids or other mass visualized. Endometrium Thickness: 4 mm.  No focal abnormality visualized. Right ovary Measurements: 4.4 x 2.8 x 4.1 cm. Normal appearance/no adnexal mass. Left ovary Measurements: 5.0 x 3.0 x 2.7 cm. Normal appearance/no adnexal mass. Other findings Small amount of simple free fluid in pelvic cul-de-sac, most likely physiologic in a reproductive age female. IMPRESSION: Normal appearance of uterus and ovaries. No pelvic mass or other significant abnormality identified. Electronically Signed   By: Myles Rosenthal M.D.   On: 03/15/2017 13:50   US Abdomen Limited Ruq  Result Date: 03/15/2017 CLINICAL DATA:  Acute onset of right upper quadrant abdominal pain. EXAM: ULTRASOUND ABDOMEN LIMITED RIGHT UPPER QUADRANT COMPARISON:  CT  of the abdomen and pelvis performed 03/13/2017 FINDINGS: Gallbladder: No gallstones or wall thickening visualized. No sonographic Murphy sign noted by sonographer. Common bile duct: Diameter: 0.3 cm, within normal limits in caliber. Liver: No focal lesion identified. Within normal limits in parenchymal echogenicity. Portal vein is patent on color Doppler imaging with normal direction of blood flow towards the liver. Note is made of minimal right-sided hydronephrosis. This could reflect a distal obstructing stone, not well seen on recent CT. No definite findings of pyelonephritis are seen. IMPRESSION: 1. No acute abnormality seen at the right upper quadrant. The gallbladder is unremarkable in appearance. 2. Minimal right-sided hydronephrosis. This could reflect a distal obstructing stone, not well seen on recent CT. No definite evidence for pyelonephritis. Electronically Signed   By: Roanna Raider M.D.   On: 03/15/2017 01:09      Scheduled Meds: . insulin aspart  0-9 Units Subcutaneous TID WC  . insulin detemir  30 Units Subcutaneous Daily  . promethazine  12.5 mg Intravenous Once   Continuous Infusions: . cefTRIAXone (ROCEPHIN)  IV Stopped (03/16/17 0617)  . dextrose 5 % and 0.45% NaCl 100 mL/hr at 03/16/17 1158     LOS: 1 day    Time spent: 30 minutes   Noralee Stain, DO [facility] [email or web address] Password [facility] 03/16/2017, 12:06 PM

## 2017-03-16 NOTE — Progress Notes (Signed)
Patient requested to move her 10am Levemir to night time when she normally takes it at home. I sent a message to pharmacy to adjust the time, did not see that they sent back a message that it had to be adjusted by MD until a little later. I paged MD Alvino Chapelhoi, she said because of her admit time she would be going a long time without her long acting since she did not receive it last night so she would prefer to keep it in morning and it was okay to give the missed dose this morning now. Relayed that information with the patient.

## 2017-03-17 LAB — CBC
HEMATOCRIT: 27.5 % — AB (ref 36.0–46.0)
HEMOGLOBIN: 9.1 g/dL — AB (ref 12.0–15.0)
MCH: 30.5 pg (ref 26.0–34.0)
MCHC: 33.1 g/dL (ref 30.0–36.0)
MCV: 92.3 fL (ref 78.0–100.0)
Platelets: 370 10*3/uL (ref 150–400)
RBC: 2.98 MIL/uL — ABNORMAL LOW (ref 3.87–5.11)
RDW: 12.9 % (ref 11.5–15.5)
WBC: 12.4 10*3/uL — ABNORMAL HIGH (ref 4.0–10.5)

## 2017-03-17 LAB — BASIC METABOLIC PANEL
ANION GAP: 10 (ref 5–15)
CO2: 20 mmol/L — ABNORMAL LOW (ref 22–32)
Calcium: 8.2 mg/dL — ABNORMAL LOW (ref 8.9–10.3)
Chloride: 108 mmol/L (ref 101–111)
Creatinine, Ser: 0.44 mg/dL (ref 0.44–1.00)
GFR calc Af Amer: >60 mL/min (ref >60)
GFR calc non Af Amer: >60 mL/min (ref >60)
GLUCOSE: 247 mg/dL — AB (ref 65–99)
POTASSIUM: 3.3 mmol/L — AB (ref 3.5–5.1)
Sodium: 138 mmol/L (ref 135–145)

## 2017-03-17 LAB — GLUCOSE, CAPILLARY
GLUCOSE-CAPILLARY: 225 mg/dL — AB (ref 65–99)
Glucose-Capillary: 253 mg/dL — ABNORMAL HIGH (ref 65–99)

## 2017-03-17 MED ORDER — CIPROFLOXACIN HCL 500 MG PO TABS: 500.0000 mg | ORAL_TABLET | Freq: Two times a day (BID) | ORAL | 0 refills | Status: AC

## 2017-03-17 MED ORDER — KETOROLAC TROMETHAMINE 10 MG PO TABS: 10.0000 mg | ORAL_TABLET | Freq: Four times a day (QID) | ORAL | 0 refills | Status: DC | PRN

## 2017-03-17 MED ORDER — INSULIN ASPART 100 UNIT/ML ~~LOC~~ SOLN
3.0000 [IU] | Freq: Three times a day (TID) | SUBCUTANEOUS | Status: DC
Start: 2017-03-17 — End: 2017-03-17

## 2017-03-17 MED ORDER — CIPROFLOXACIN HCL 500 MG PO TABS: 500.0000 mg | ORAL_TABLET | Freq: Two times a day (BID) | ORAL | Status: DC

## 2017-03-17 MED ORDER — POTASSIUM CHLORIDE CRYS ER 20 MEQ PO TBCR
40.0000 meq | EXTENDED_RELEASE_TABLET | Freq: Once | ORAL | Status: AC
  Administered 2017-03-17: 40 meq via ORAL
  Filled 2017-03-17: qty 2

## 2017-03-17 NOTE — Progress Notes (Signed)
Discharge paperwork reviewed with patient. Patient is ready for discharge. No questions verbalized.

## 2017-03-17 NOTE — Discharge Summary (Signed)
Physician Discharge Summary  Mandy Patel VOZ:[id number] DOB: 07/04/1995 DOA: 03/14/2017  PCP: Patient, No Pcp Per  Admit date: 03/14/2017 Discharge date: 03/17/2017  Admitted From: Home Disposition:  Home   Recommendations for Outpatient Follow-up:  1. Follow up with PCP in 1 week. [facility]  2. Please obtain BMP/CBC in 1 week   Discharge Condition: Stable CODE STATUS: Full  Diet recommendation: Regular diet  Brief/Interim Summary: Mandy A Allenis a 21 y.o.femalewithhistory of diabetes mellitus type 1 who was recently admitted for DKA and influenza presents to the ER for the second time over the last 24 hours because of persistent right flank pain. Patient had come on March 13, 2017 and at that time had CT renal study which did not show anything acute and was discharged home on Keflex. Despite takingthe antibiotic, patient was still having persistent pain in the right flank. In the ER,patient had abdominal sonogram which was concerning for right-sided hydronephrosis with possible distal obstruction. ER physician discussed with Dr. Liliane ShiWinter on-call urologist who at this timefeltthat patient's symptoms may be from pyelonephritis and to treat for pyelonephritis and if symptoms does not get better to reconsult urology. Patient's labs show elevated blood sugar with elevated anion gap. She was given IV rocephin for treatment of pyelonephritis as well as D5 0.45NS to treat early DKA with metabolic acidosis.   Discharge Diagnoses:  Principal Problem:   Acute right flank pain Active Problems:   Type 1 diabetes mellitus with hyperglycemia (HCC)   Acute pyelonephritis   Acute right flank pain secondary to suspected pyelonephritis and mild hydronephrosis -Urine pregnancy test negative -EDP spoke with urology and recommended to treat pyelo  -Urine culture from 2/10 with insignificant growth -Treated with rocephin --> Cipro on discharge   -Pain resolved today, WBC improving   Early DKA, type 1 -Improved metabolic acidosis with resolved anion gap   Hypokalemia -Replace   Discharge Instructions  Discharge Instructions    Call MD for:  difficulty breathing, headache or visual disturbances   Complete by:  As directed    Call MD for:  extreme fatigue   Complete by:  As directed    Call MD for:  hives   Complete by:  As directed    Call MD for:  persistant dizziness or light-headedness   Complete by:  As directed    Call MD for:  persistant nausea and vomiting   Complete by:  As directed    Call MD for:  severe uncontrolled pain   Complete by:  As directed    Call MD for:  temperature >100.4   Complete by:  As directed    Diet general   Complete by:  As directed    Discharge instructions   Complete by:  As directed    You were cared for by a hospitalist during your hospital stay. If you have any questions about your discharge medications or the care you received while you were in the hospital after you are discharged, you can call the unit and asked to speak with the hospitalist on call if the hospitalist that took care of you is not available. Once you are discharged, your primary care physician will handle any further medical issues. Please note that NO REFILLS for any discharge medications will be authorized once you are discharged, as it is imperative that you return to your primary care physician (or establish a relationship with a primary care physician if you do not  have one) for your aftercare needs so that they can reassess your need for medications and monitor your lab values.   Increase activity slowly   Complete by:  As directed      Allergies as of 03/17/2017      Reactions   Peanut Oil Other (See Comments)   Reaction to peanuts - gums feel numb and tingling   Other Swelling   Pecans caused throat swelling and weakness      Medication List    STOP taking these medications   cephALEXin 500 MG  capsule Commonly known as:  KEFLEX   ibuprofen 800 MG tablet Commonly known as:  ADVIL,MOTRIN   insulin glargine 100 unit/mL Sopn Commonly known as:  LANTUS     TAKE these medications   albuterol 108 (90 Base) MCG/ACT inhaler Commonly known as:  PROVENTIL HFA;VENTOLIN HFA Inhale 2 puffs into the lungs every 6 (six) hours as needed for wheezing or shortness of breath.   ciprofloxacin 500 MG tablet Commonly known as:  CIPRO Take 1 tablet (500 mg total) by mouth 2 (two) times daily for 3 days.   etonogestrel 68 MG Impl implant Commonly known as:  NEXPLANON 1 each by Subdermal route once. Implanted Sept 2016   ketorolac 10 MG tablet Commonly known as:  TORADOL Take 1 tablet (10 mg total) by mouth every 6 (six) hours as needed for moderate pain or severe pain.   LEVEMIR FLEXTOUCH 100 UNIT/ML Pen Generic drug:  Insulin Detemir Inject 30 Units into the skin at bedtime.   NOVOLOG FLEXPEN 100 UNIT/ML FlexPen Generic drug:  insulin aspart Inject 4-10 Units into the skin 3 (three) times daily with meals. Based on carb count and CBG What changed:  Another medication with the same name was removed. Continue taking this medication, and follow the directions you see here.      Follow-up Information    [facility] Follow up.   Why:  Call to establish care with a primary care physician  Contact information: [address] [place] [address] [phone]         Allergies  Allergen Reactions  . Peanut Oil Other (See Comments)    Reaction to peanuts - gums feel numb and tingling  . Other Swelling    Pecans caused throat swelling and weakness    Consultations:  None    Procedures/Studies: Dg Chest 2 View  Result Date: 03/05/2017 CLINICAL DATA:  Shortness of breath, productive cough EXAM: CHEST  2 VIEW COMPARISON:  06/12/2015 FINDINGS: The heart size and mediastinal contours are within normal limits. Both lungs are clear.  The visualized skeletal structures are unremarkable. IMPRESSION: No active cardiopulmonary disease. Electronically Signed   By: Elige Ko   On: 03/05/2017 09:32   Dg Chest Portable 1 View  Result Date: 03/06/2017 CLINICAL DATA:  Cough, vomiting EXAM: PORTABLE CHEST 1 VIEW COMPARISON:  03/05/2017 FINDINGS: Lungs are clear.  No pleural effusion or pneumothorax. The heart is normal in size. Visualized osseous structures are within normal limits. IMPRESSION: Normal chest radiographs. Electronically Signed   By: Charline Bills M.D.   On: 03/06/2017 08:49   Ct Renal Stone Study  Result Date: 03/13/2017 CLINICAL DATA:  Two days of RIGHT flank pain EXAM: CT ABDOMEN AND PELVIS WITHOUT CONTRAST TECHNIQUE: Multidetector CT imaging of the abdomen and pelvis was performed following the standard protocol without IV contrast. COMPARISON:  Ultrasound 05/19/1998 FINDINGS: Lower chest: Lung bases are clear. Hepatobiliary: No focal  hepatic lesion. No biliary duct dilatation. Gallbladder is normal. Common bile duct is normal. Pancreas: Pancreas is normal. No ductal dilatation. No pancreatic inflammation. Spleen: Normal spleen Adrenals/urinary tract: Adrenal glands difficult to identify. No nephrolithiasis or ureterolithiasis. No obstructive uropathy. No bladder calculi Stomach/Bowel: Stomach, small bowel, appendix, and cecum are normal. The colon and rectosigmoid colon are normal. The colon and rectosigmoid colon are normal. Vascular/Lymphatic: Abdominal aorta is normal caliber. There is no retroperitoneal or periportal lymphadenopathy. No pelvic lymphadenopathy. Reproductive: Uterus and ovaries normal Other: No free fluid. Musculoskeletal: No aggressive osseous lesion. IMPRESSION: 1. No nephrolithiasis or ureterolithiasis. 2. Normal appendix. 3. Exam somewhat difficult interpret due to the no IV or oral contrast and limited intra-abdominal fat. Electronically Signed   By: Genevive Bi M.D.   On: 03/13/2017 12:39    US Pelvic Complete With Transvaginal  Result Date: 03/15/2017 CLINICAL DATA:  Right flank and pelvic pain for 4 days. EXAM: TRANSABDOMINAL AND TRANSVAGINAL ULTRASOUND OF PELVIS TECHNIQUE: Both transabdominal and transvaginal ultrasound examinations of the pelvis were performed. Transabdominal technique was performed for global imaging of the pelvis including uterus, ovaries, adnexal regions, and pelvic cul-de-sac. It was necessary to proceed with endovaginal exam following the transabdominal exam to visualize the endometrium and ovaries. COMPARISON:  None FINDINGS: Uterus Measurements: 10.3 x 3.5 x 5.8 cm. No fibroids or other mass visualized. Endometrium Thickness: 4 mm.  No focal abnormality visualized. Right ovary Measurements: 4.4 x 2.8 x 4.1 cm. Normal appearance/no adnexal mass. Left ovary Measurements: 5.0 x 3.0 x 2.7 cm. Normal appearance/no adnexal mass. Other findings Small amount of simple free fluid in pelvic cul-de-sac, most likely physiologic in a reproductive age female. IMPRESSION: Normal appearance of uterus and ovaries. No pelvic mass or other significant abnormality identified. Electronically Signed   By: Myles Rosenthal M.D.   On: 03/15/2017 13:50   US Abdomen Limited Ruq  Result Date: 03/15/2017 CLINICAL DATA:  Acute onset of right upper quadrant abdominal pain. EXAM: ULTRASOUND ABDOMEN LIMITED RIGHT UPPER QUADRANT COMPARISON:  CT of the abdomen and pelvis performed 03/13/2017 FINDINGS: Gallbladder: No gallstones or wall thickening visualized. No sonographic Murphy sign noted by sonographer. Common bile duct: Diameter: 0.3 cm, within normal limits in caliber. Liver: No focal lesion identified. Within normal limits in parenchymal echogenicity. Portal vein is patent on color Doppler imaging with normal direction of blood flow towards the liver. Note is made of minimal right-sided hydronephrosis. This could reflect a distal obstructing stone, not well seen on recent CT. No definite findings  of pyelonephritis are seen. IMPRESSION: 1. No acute abnormality seen at the right upper quadrant. The gallbladder is unremarkable in appearance. 2. Minimal right-sided hydronephrosis. This could reflect a distal obstructing stone, not well seen on recent CT. No definite evidence for pyelonephritis. Electronically Signed   By: Roanna Raider M.D.   On: 03/15/2017 01:09      Discharge Exam: Vitals:   03/16/17 2205 03/17/17 0604  BP: 125/67 118/70  Pulse: 96 96  Resp: 18 17  Temp: 98.5 F (36.9 C) 98.5 F (36.9 C)  SpO2: 100% 100%   Vitals:   03/16/17 1440 03/16/17 2205 03/17/17 0500 03/17/17 0604  BP: (!) 106/54 125/67  118/70  Pulse: (!) 117 96  96  Resp: 16 18  17   Temp: 98.8 F (37.1 C) 98.5 F (36.9 C)  98.5 F (36.9 C)  TempSrc: Oral Oral    SpO2: 100% 100%  100%  Weight:   76.2 kg (167 lb 15.9 oz)  Height:        General: Pt is alert, awake, not in acute distress Cardiovascular: RRR, S1/S2 +, no rubs, no gallops Respiratory: CTA bilaterally, no wheezing, no rhonchi Abdominal: Soft, NT, ND, bowel sounds + Extremities: no edema, no cyanosis    The results of significant diagnostics from this hospitalization (including imaging, microbiology, ancillary and laboratory) are listed below for reference.     Microbiology: Recent Results (from the past 240 hour(s))  Urine Culture     Status: Abnormal   Collection Time: 03/13/17  4:11 PM  Result Value Ref Range Status   Specimen Description URINE, CLEAN CATCH  Final   Special Requests NONE  Final   Culture (A)  Final    <10,000 COLONIES/mL INSIGNIFICANT GROWTH Performed at [facility], [address], [place], [place] [address]    Report Status 03/14/2017 FINAL  Final     Labs: BNP (last 3 results) No results for input(s): BNP in the last 8760 hours. Basic Metabolic Panel: Recent Labs  Lab 03/14/17 2331 03/15/17 0133 03/15/17 0725 03/16/17 0503 03/17/17 0505  NA 133* 136 134* 133* 138  K 3.9 3.9 4.1  3.2* 3.3*  CL 103 107 107 106 108  CO2 16* 15* 12* 13* 20*  GLUCOSE 312* 213* 246* 146* 247*  BUN 6 <5* <5* <5* <5*  CREATININE 0.66 0.61 0.64 0.50 0.44  CALCIUM 8.7* 8.3* 8.3* 8.4* 8.2*   Liver Function Tests: Recent Labs  Lab 03/13/17 1143 03/14/17 2331 03/15/17 0133  AST 19 12* 19  ALT 21 25 27   ALKPHOS 82 106 98  BILITOT 0.8 0.9 0.5  PROT 7.8 6.8 6.3*  ALBUMIN 3.1* 3.0* 2.8*   Recent Labs  Lab 03/14/17 2331  LIPASE 15   No results for input(s): AMMONIA in the last 168 hours. CBC: Recent Labs  Lab 03/13/17 1143 03/14/17 2248 03/16/17 0503 03/17/17 0505  WBC 19.5* 18.7* 15.0* 12.4*  NEUTROABS 13.9*  --   --   --   HGB 10.6* 9.9* 9.1* 9.1*  HCT 32.4* 29.6* 27.4* 27.5*  MCV 94.2 94.0 92.9 92.3  PLT 239 256 319 370   Cardiac Enzymes: No results for input(s): CKTOTAL, CKMB, CKMBINDEX, TROPONINI in the last 168 hours. BNP: Invalid input(s): POCBNP CBG: Recent Labs  Lab 03/16/17 1204 03/16/17 1720 03/16/17 2159 03/17/17 0805 03/17/17 1232  GLUCAP 171* 275* 288* 225* 253*   D-Dimer No results for input(s): DDIMER in the last 72 hours. Hgb A1c No results for input(s): HGBA1C in the last 72 hours. Lipid Profile No results for input(s): CHOL, HDL, LDLCALC, TRIG, CHOLHDL, LDLDIRECT in the last 72 hours. Thyroid function studies No results for input(s): TSH, T4TOTAL, T3FREE, THYROIDAB in the last 72 hours.  Invalid input(s): FREET3 Anemia work up No results for input(s): VITAMINB12, FOLATE, FERRITIN, TIBC, IRON, RETICCTPCT in the last 72 hours. Urinalysis    Component Value Date/Time   COLORURINE STRAW (A) 03/15/2017 0116   APPEARANCEUR CLEAR 03/15/2017 0116   LABSPEC 1.024 03/15/2017 0116   PHURINE 6.0 03/15/2017 0116   GLUCOSEU >=500 (A) 03/15/2017 0116   GLUCOSEU >=1000 12/01/2010 1051   HGBUR SMALL (A) 03/15/2017 0116   BILIRUBINUR NEGATIVE 03/15/2017 0116   KETONESUR 80 (A) 03/15/2017 0116   PROTEINUR NEGATIVE 03/15/2017 0116   UROBILINOGEN  0.2 12/01/2010 1051   NITRITE NEGATIVE 03/15/2017 0116   LEUKOCYTESUR NEGATIVE 03/15/2017 0116   Sepsis Labs Invalid input(s): PROCALCITONIN,  WBC,  LACTICIDVEN Microbiology Recent Results (from the past 240 hour(s))  Urine Culture     Status: Abnormal   Collection Time: 03/13/17  4:11 PM  Result Value Ref Range Status   Specimen Description URINE, CLEAN CATCH  Final   Special Requests NONE  Final   Culture (A)  Final    <10,000 COLONIES/mL INSIGNIFICANT GROWTH Performed at [facility], [address], [place], [place] [address]    Report Status 03/14/2017 FINAL  Final     Time coordinating discharge: 30 minutes  SIGNED:  Noralee Stain, DO [facility] Pager [phone]  If 7PM-7AM, please contact night-coverage [email or web address] Password [email or web address] 03/17/2017, 1:01 PM

## 2017-03-17 NOTE — Discharge Instructions (Signed)
Pyelonephritis, Adult Pyelonephritis is a kidney infection. The kidneys are organs that help clean your blood by moving waste out of your blood and into your pee (urine). This infection can happen quickly, or it can last for a long time. In most cases, it clears up with treatment and does not cause other problems. Follow these instructions at home: Medicines  Take over-the-counter and prescription medicines only as told by your doctor.  Take your antibiotic medicine as told by your doctor. Do not stop taking the medicine even if you start to feel better. General instructions  Drink enough fluid to keep your pee clear or pale yellow.  Avoid caffeine, tea, and carbonated drinks.  Pee (urinate) often. Avoid holding in pee for long periods of time.  Pee before and after sex.  After pooping (having a bowel movement), women should wipe from front to back. Use each tissue only once.  Keep all follow-up visits as told by your doctor. This is important. Contact a doctor if:  You do not feel better after 2 days.  Your symptoms get worse.  You have a fever. Get help right away if:  You cannot take your medicine or drink fluids as told.  You have chills and shaking.  You throw up (vomit).  You have very bad pain in your side (flank) or back.  You feel very weak or you pass out (faint). This information is not intended to replace advice given to you by your health care provider. Make sure you discuss any questions you have with your health care provider. Document Released: 02/26/2004 Document Revised: 06/26/2015 Document Reviewed: 05/13/2014 Elsevier Interactive Patient Education  2018 Elsevier Inc.  

## 2017-03-21 ENCOUNTER — Other Ambulatory Visit: Payer: Self-pay | Admitting: *Deleted

## 2017-03-21 NOTE — Patient Outreach (Signed)
[facility] Care Management  03/21/2017  Mandy Patel [birth year] [id number]   Subjective:Telephone call to patient's home / mobile number, spoke with patient, and HIPAA verified.  [facility]ed [facility] Transition of care follow up, patient voiced understanding, and is in agreement to follow up.   Patient states she is doing fine, was recently in the hospital times 2, will be [facility] to establish care.  States she is no longer employed by [employer], will be starting a new job in the near future, and still has [employer] insurance until her new insurance starts.  States she is not sure when her new insurance will start, her cell phone battery is almost dead, not near a charger, and requested call back on 03/22/17.       Objective:Per KPN (Knowledge Performance Now, point of care tool) and chart review, patient hospitalized 03/15/17 -03/17/17 for Acute pyelonephritis.   Patient also hospitalized 03/06/17 -03/07/17 for DKA (diabetic ketoacidoses) and Influenza A.   Patient has a history of diabetes.     Assessment: Received UMR Transition of care referral on 03/18/17.   Transition of care follow up pending patient contact.       Plan: RNCM will call patient for 2nd telephone outreach attempt, transition of care follow up, within 10 business days if no return call.      Tanna Loeffler H. Gardiner Barefootooper RN, BSN, CCM [facility] Care Management [facility] Telephonic CM Phone: [phone] Fax: [phone]

## 2017-03-22 ENCOUNTER — Other Ambulatory Visit: Payer: Self-pay | Admitting: *Deleted

## 2017-03-22 ENCOUNTER — Ambulatory Visit: Payer: 59 | Admitting: *Deleted

## 2017-03-22 ENCOUNTER — Encounter: Payer: Self-pay | Admitting: *Deleted

## 2017-03-22 NOTE — Patient Outreach (Signed)
[facility]) Care Management  03/22/2017  Mandy Patel 03/25/1995 [id number]   Subjective: Telephone call to patient's home  / mobile number, no answer, left HIPAA compliant voicemail message, and requested call back.   Objective:Per KPN (Knowledge Performance Now, point of care tool) and chart review, patient hospitalized 03/15/17 -03/17/17 for Acute pyelonephritis.   Patient also hospitalized 03/06/17 -03/07/17 for DKA (diabetic ketoacidoses) and Influenza A.   Patient has a history of diabetes.     Assessment: Received UMR Transition of care referral on 03/18/17.   Transition of care follow up pending patient contact.       Plan: RNCM will send unsuccessful outreach  letter, [facility] pamphlet, will call patient for 3rd telephone outreach attempt, transition of care follow up, and proceed with case closure, within 10 business days if no return call.      Mandy Kirsten H. Gardiner Barefootooper RN, BSN, CCM [facility] Care Management [facility] Telephonic CM Phone: [phone] Fax: [phone]

## 2017-04-04 ENCOUNTER — Ambulatory Visit: Payer: Self-pay | Admitting: *Deleted

## 2017-04-04 ENCOUNTER — Other Ambulatory Visit: Payer: Self-pay | Admitting: *Deleted

## 2017-04-04 NOTE — Patient Outreach (Signed)
[facility]) Care Management  04/04/2017  Mandy Patel [birth year] [id number]     Subjective: Telephone call to patient's home  / mobile number, no answer, left HIPAA compliant voicemail message, and requested call back.    Objective:Per KPN (Knowledge Performance Now, point of care tool) and chart review,patient hospitalized 03/15/17 -03/17/17 forAcute pyelonephritis. Patient also hospitalized 03/06/17 -03/07/17 for DKA (diabetic ketoacidoses) andInfluenza A. Patient has a history of diabetes.     Assessment:Received UMR Transition of care referral on 03/18/17. Transition of care follow up not completed due to unable to contact patient and will proceed with case closure.       Plan:RNCM will send case closure due to unable to reach request to Mandy AlaminLaura Patel at [facility].     Mandy Lull H. Gardiner Barefootooper RN, BSN, CCM [facility] Care [facility] Telephonic CM Phone: [phone] Fax: [phone]

## 2017-09-04 ENCOUNTER — Inpatient Hospital Stay: Payer: Self-pay

## 2017-09-04 ENCOUNTER — Inpatient Hospital Stay
Admission: EM | Admit: 2017-09-04 | Discharge: 2017-09-05 | DRG: 872 | Disposition: A | Discharge status: Home / Self Care | Payer: Self-pay | Attending: Internal Medicine | Admitting: Internal Medicine

## 2017-09-04 ENCOUNTER — Other Ambulatory Visit: Payer: Self-pay

## 2017-09-04 ENCOUNTER — Encounter: Payer: Self-pay | Admitting: Emergency Medicine

## 2017-09-04 DIAGNOSIS — Z794 Long term (current) use of insulin: Secondary | ICD-10-CM

## 2017-09-04 DIAGNOSIS — Z833 Family history of diabetes mellitus: Secondary | ICD-10-CM

## 2017-09-04 DIAGNOSIS — E1010 Type 1 diabetes mellitus with ketoacidosis without coma: Secondary | ICD-10-CM

## 2017-09-04 DIAGNOSIS — Z9111 Patient's noncompliance with dietary regimen: Secondary | ICD-10-CM

## 2017-09-04 DIAGNOSIS — N390 Urinary tract infection, site not specified: Secondary | ICD-10-CM | POA: Diagnosis present

## 2017-09-04 DIAGNOSIS — Z9114 Patient's other noncompliance with medication regimen: Secondary | ICD-10-CM

## 2017-09-04 DIAGNOSIS — Z23 Encounter for immunization: Secondary | ICD-10-CM

## 2017-09-04 DIAGNOSIS — A419 Sepsis, unspecified organism: Principal | ICD-10-CM | POA: Diagnosis present

## 2017-09-04 DIAGNOSIS — E1065 Type 1 diabetes mellitus with hyperglycemia: Secondary | ICD-10-CM | POA: Diagnosis present

## 2017-09-04 DIAGNOSIS — E7800 Pure hypercholesterolemia, unspecified: Secondary | ICD-10-CM | POA: Diagnosis present

## 2017-09-04 LAB — URINALYSIS, COMPLETE (UACMP) WITH MICROSCOPIC
BILIRUBIN URINE: NEGATIVE
KETONES UR: 80 mg/dL — AB
Nitrite: NEGATIVE
PROTEIN: 30 mg/dL — AB
Specific Gravity, Urine: 1.021 (ref 1.005–1.030)
pH: 6 (ref 5.0–8.0)

## 2017-09-04 LAB — BLOOD GAS, VENOUS
Acid-base deficit: 8.9 mmol/L — ABNORMAL HIGH (ref 0.0–2.0)
Bicarbonate: 15.5 mmol/L — ABNORMAL LOW (ref 20.0–28.0)
O2 Saturation: 67.1 %
PH VEN: 7.35 (ref 7.250–7.430)
Patient temperature: 37
pCO2, Ven: 28 mmHg — ABNORMAL LOW (ref 44.0–60.0)
pO2, Ven: 37 mmHg (ref 32.0–45.0)

## 2017-09-04 LAB — BASIC METABOLIC PANEL
Anion gap: 14 (ref 5–15)
BUN: <5 mg/dL — ABNORMAL LOW (ref 6–20)
CALCIUM: 7.8 mg/dL — AB (ref 8.9–10.3)
CO2: 15 mmol/L — ABNORMAL LOW (ref 22–32)
CREATININE: 0.51 mg/dL (ref 0.44–1.00)
Chloride: 105 mmol/L (ref 98–111)
Glucose, Bld: 251 mg/dL — ABNORMAL HIGH (ref 70–99)
Potassium: 3.4 mmol/L — ABNORMAL LOW (ref 3.5–5.1)
SODIUM: 134 mmol/L — AB (ref 135–145)

## 2017-09-04 LAB — COMPREHENSIVE METABOLIC PANEL
ALBUMIN: 3.3 g/dL — AB (ref 3.5–5.0)
ALT: 10 U/L (ref 0–44)
AST: 12 U/L — AB (ref 15–41)
Alkaline Phosphatase: 105 U/L (ref 38–126)
Anion gap: 16 — ABNORMAL HIGH (ref 5–15)
BUN: 6 mg/dL (ref 6–20)
CHLORIDE: 99 mmol/L (ref 98–111)
CO2: 16 mmol/L — AB (ref 22–32)
Calcium: 8.6 mg/dL — ABNORMAL LOW (ref 8.9–10.3)
Creatinine, Ser: 0.61 mg/dL (ref 0.44–1.00)
GFR calc Af Amer: >60 mL/min (ref >60)
GFR calc non Af Amer: >60 mL/min (ref >60)
GLUCOSE: 344 mg/dL — AB (ref 70–99)
POTASSIUM: 3.2 mmol/L — AB (ref 3.5–5.1)
SODIUM: 131 mmol/L — AB (ref 135–145)
Total Bilirubin: 1.1 mg/dL (ref 0.3–1.2)
Total Protein: 8.2 g/dL — ABNORMAL HIGH (ref 6.5–8.1)

## 2017-09-04 LAB — MAGNESIUM
MAGNESIUM: 1.6 mg/dL — AB (ref 1.7–2.4)
Magnesium: 2 mg/dL (ref 1.7–2.4)

## 2017-09-04 LAB — CBC
HEMATOCRIT: 33.8 % — AB (ref 35.0–47.0)
HEMOGLOBIN: 11.3 g/dL — AB (ref 12.0–16.0)
MCH: 30.5 pg (ref 26.0–34.0)
MCHC: 33.3 g/dL (ref 32.0–36.0)
MCV: 91.6 fL (ref 80.0–100.0)
Platelets: 326 10*3/uL (ref 150–440)
RBC: 3.68 MIL/uL — AB (ref 3.80–5.20)
RDW: 13 % (ref 11.5–14.5)
WBC: 18.5 10*3/uL — ABNORMAL HIGH (ref 3.6–11.0)

## 2017-09-04 LAB — POCT PREGNANCY, URINE: PREG TEST UR: NEGATIVE

## 2017-09-04 LAB — LIPASE, BLOOD: Lipase: 17 U/L (ref 11–51)

## 2017-09-04 LAB — HEMOGLOBIN A1C
HEMOGLOBIN A1C: 12.2 % — AB (ref 4.8–5.6)
MEAN PLASMA GLUCOSE: 303.44 mg/dL

## 2017-09-04 LAB — GLUCOSE, CAPILLARY
GLUCOSE-CAPILLARY: 351 mg/dL — AB (ref 70–99)
GLUCOSE-CAPILLARY: 302 mg/dL — AB (ref 70–99)
Glucose-Capillary: 224 mg/dL — ABNORMAL HIGH (ref 70–99)
Glucose-Capillary: 167 mg/dL — ABNORMAL HIGH (ref 70–99)

## 2017-09-04 LAB — PROTIME-INR
INR: 1.17
PROTHROMBIN TIME: 14.8 s (ref 11.4–15.2)

## 2017-09-04 LAB — LACTIC ACID, PLASMA: Lactic Acid, Venous: 0.9 mmol/L (ref 0.5–1.9)

## 2017-09-04 LAB — APTT: APTT: 38 s — AB (ref 24–36)

## 2017-09-04 LAB — BETA-HYDROXYBUTYRIC ACID: Beta-Hydroxybutyric Acid: 4.47 mmol/L — ABNORMAL HIGH (ref 0.05–0.27)

## 2017-09-04 LAB — PROCALCITONIN: PROCALCITONIN: 1.69 ng/mL

## 2017-09-04 MED ORDER — SODIUM CHLORIDE 0.9 % IV SOLN
INTRAVENOUS | Status: AC
  Filled 2017-09-04: qty 10

## 2017-09-04 MED ORDER — ONDANSETRON HCL 4 MG/2ML IJ SOLN
4.0000 mg | Freq: Once | INTRAMUSCULAR | Status: AC
  Administered 2017-09-04: 4 mg via INTRAVENOUS
  Filled 2017-09-04: qty 2

## 2017-09-04 MED ORDER — POTASSIUM CHLORIDE IN NACL 40-0.9 MEQ/L-% IV SOLN
INTRAVENOUS | Status: DC
  Administered 2017-09-04 – 2017-09-05 (×2): 125 mL/h via INTRAVENOUS
  Filled 2017-09-04 (×7): qty 1000

## 2017-09-04 MED ORDER — INSULIN ASPART 100 UNIT/ML ~~LOC~~ SOLN: 0.0000 [IU] | Freq: Every day | SUBCUTANEOUS | Status: DC

## 2017-09-04 MED ORDER — ETONOGESTREL 68 MG ~~LOC~~ IMPL: 1.0000 | DRUG_IMPLANT | Freq: Once | SUBCUTANEOUS | Status: DC

## 2017-09-04 MED ORDER — INSULIN GLARGINE 100 UNIT/ML ~~LOC~~ SOLN
20.0000 [IU] | Freq: Once | SUBCUTANEOUS | Status: AC
  Administered 2017-09-04: 20 [IU] via SUBCUTANEOUS
  Filled 2017-09-04: qty 0.2

## 2017-09-04 MED ORDER — POTASSIUM CHLORIDE CRYS ER 20 MEQ PO TBCR
20.0000 meq | EXTENDED_RELEASE_TABLET | Freq: Once | ORAL | Status: AC
  Administered 2017-09-04: 20 meq via ORAL
  Filled 2017-09-04: qty 1

## 2017-09-04 MED ORDER — INSULIN ASPART 100 UNIT/ML ~~LOC~~ SOLN
0.0000 [IU] | Freq: Three times a day (TID) | SUBCUTANEOUS | Status: DC
  Administered 2017-09-04: 5 [IU] via SUBCUTANEOUS
  Filled 2017-09-04: qty 1

## 2017-09-04 MED ORDER — POTASSIUM CHLORIDE 10 MEQ/100ML IV SOLN
10.0000 meq | Freq: Once | INTRAVENOUS | Status: AC
  Administered 2017-09-04: 10 meq via INTRAVENOUS
  Filled 2017-09-04 (×2): qty 100

## 2017-09-04 MED ORDER — SODIUM CHLORIDE 0.9 % IV BOLUS (SEPSIS)
1000.0000 mL | Freq: Once | INTRAVENOUS | Status: AC
Start: 2017-09-04 — End: 2017-09-04
  Administered 2017-09-04: 1000 mL via INTRAVENOUS

## 2017-09-04 MED ORDER — PNEUMOCOCCAL VAC POLYVALENT 25 MCG/0.5ML IJ INJ
0.5000 mL | INJECTION | INTRAMUSCULAR | Status: AC
  Administered 2017-09-05: 0.5 mL via INTRAMUSCULAR
  Filled 2017-09-04: qty 0.5

## 2017-09-04 MED ORDER — ACETAMINOPHEN 325 MG PO TABS
650.0000 mg | ORAL_TABLET | Freq: Four times a day (QID) | ORAL | Status: DC | PRN
  Administered 2017-09-04: 650 mg via ORAL
  Filled 2017-09-04: qty 2

## 2017-09-04 MED ORDER — ONDANSETRON HCL 4 MG PO TABS: 4.0000 mg | ORAL_TABLET | Freq: Four times a day (QID) | ORAL | Status: DC | PRN

## 2017-09-04 MED ORDER — SODIUM CHLORIDE 0.9 % IV BOLUS (SEPSIS): 250.0000 mL | Freq: Once | INTRAVENOUS | Status: DC

## 2017-09-04 MED ORDER — POLYETHYLENE GLYCOL 3350 17 G PO PACK: 17.0000 g | PACK | Freq: Every day | ORAL | Status: DC | PRN

## 2017-09-04 MED ORDER — SODIUM CHLORIDE 0.9 % IV BOLUS (SEPSIS)
1000.0000 mL | Freq: Once | INTRAVENOUS | Status: AC
  Administered 2017-09-04: 1000 mL via INTRAVENOUS

## 2017-09-04 MED ORDER — HYDROCODONE-ACETAMINOPHEN 5-325 MG PO TABS
1.0000 | ORAL_TABLET | ORAL | Status: DC | PRN
  Administered 2017-09-05: 1 via ORAL
  Filled 2017-09-04: qty 1

## 2017-09-04 MED ORDER — INSULIN ASPART 100 UNIT/ML ~~LOC~~ SOLN
10.0000 [IU] | Freq: Three times a day (TID) | SUBCUTANEOUS | Status: DC
  Administered 2017-09-04: 10 [IU] via SUBCUTANEOUS
  Filled 2017-09-04: qty 1

## 2017-09-04 MED ORDER — ONDANSETRON HCL 4 MG/2ML IJ SOLN
4.0000 mg | Freq: Four times a day (QID) | INTRAMUSCULAR | Status: DC | PRN
  Administered 2017-09-04: 4 mg via INTRAVENOUS
  Filled 2017-09-04: qty 2

## 2017-09-04 MED ORDER — SODIUM CHLORIDE 0.9 % IV SOLN
1.0000 g | INTRAVENOUS | Status: DC
  Filled 2017-09-04: qty 10

## 2017-09-04 MED ORDER — ACETAMINOPHEN 650 MG RE SUPP: 650.0000 mg | Freq: Four times a day (QID) | RECTAL | Status: DC | PRN

## 2017-09-04 MED ORDER — INSULIN DETEMIR 100 UNIT/ML ~~LOC~~ SOLN
30.0000 [IU] | Freq: Every day | SUBCUTANEOUS | Status: DC
  Administered 2017-09-04: 30 [IU] via SUBCUTANEOUS
  Filled 2017-09-04 (×2): qty 0.3

## 2017-09-04 MED ORDER — MORPHINE SULFATE (PF) 2 MG/ML IV SOLN
2.0000 mg | Freq: Once | INTRAVENOUS | Status: AC
  Administered 2017-09-04: 2 mg via INTRAVENOUS
  Filled 2017-09-04: qty 1

## 2017-09-04 MED ORDER — POTASSIUM CHLORIDE 20 MEQ PO PACK
40.0000 meq | PACK | Freq: Once | ORAL | Status: AC
  Administered 2017-09-04: 40 meq via ORAL
  Filled 2017-09-04: qty 2

## 2017-09-04 MED ORDER — SODIUM CHLORIDE 0.9 % IV SOLN
2.0000 g | INTRAVENOUS | Status: DC
  Administered 2017-09-04: 2 g via INTRAVENOUS
  Filled 2017-09-04: qty 20

## 2017-09-04 MED ORDER — SODIUM CHLORIDE 0.9 % IV BOLUS
1000.0000 mL | Freq: Once | INTRAVENOUS | Status: AC
Start: 2017-09-04 — End: 2017-09-04
  Administered 2017-09-04: 1000 mL via INTRAVENOUS

## 2017-09-04 NOTE — H&P (Signed)
[employer] [facility] [place] [facility]   PATIENT NAME: Mandy Patel    MR#:  [id number]  DATE OF BIRTH:  Sep 23, 1995  DATE OF ADMISSION:  09/04/2017  PRIMARY CARE PHYSICIAN: Corwin Levins, MD   REQUESTING/REFERRING PHYSICIAN:   CHIEF COMPLAINT:   Chief Complaint  Patient presents with  . Emesis    HISTORY OF PRESENT ILLNESS: Mandy Patel  is a 22 y.o. female with a known history type 1 diabetes for which patient is noncompliant with insulin as well as diet, presents to the emergency room with emesis x4, noted to be tachycardic, anion gap 16, pH was normal, potassium 3.2, sodium 131, urinalysis notes for ketones and suspicious for UTI, glucose 344, patient evaluated emergency room, family at bedside, patient now be admitted for acute sepsis secondary to UTI with hyperglycemia due to type 1 diabetes with noncompliance with medical management.  PAST MEDICAL HISTORY:   Past Medical History:  Diagnosis Date  . Diabetes mellitus    type I, diagnosed age 22yo  . Pyelonephritis 03/2017    PAST SURGICAL HISTORY:  Past Surgical History:  Procedure Laterality Date  . EYE MUSCLE SURGERY  approx 2010   bilat eye surgury, left exotropia worse;Dr Maple Hudson, opthomology    SOCIAL HISTORY:  Social History   Tobacco Use  . Smoking status: Never Smoker  . Smokeless tobacco: Never Used  Substance Use Topics  . Alcohol use: No    FAMILY HISTORY:  Family History  Problem Relation Age of Onset  . Alcohol abuse Other   . Arthritis Other   . Hyperlipidemia Other   . Stroke Other   . Diabetes Other   . Hyperlipidemia Other   . Hypertension Other   . Diabetes Other     DRUG ALLERGIES:  Allergies  Allergen Reactions  . Peanut Oil Other (See Comments)    Reaction to peanuts - gums feel numb and tingling  . Other Swelling    Pecans caused throat swelling and weakness    REVIEW OF SYSTEMS:   CONSTITUTIONAL: No fever,+ fatigue/ weakness.  EYES: No blurred or double  vision.  EARS, NOSE, AND THROAT: No tinnitus or ear pain.  RESPIRATORY: No cough, shortness of breath, wheezing or hemoptysis.  CARDIOVASCULAR: No chest pain, orthopnea, edema.  GASTROINTESTINAL: + nausea, vomiting, no diarrhea or abdominal pain.  GENITOURINARY: No dysuria, hematuria.  ENDOCRINE: No polyuria, nocturia,  HEMATOLOGY: No anemia, easy bruising or bleeding SKIN: No rash or lesion. MUSCULOSKELETAL: No joint pain or arthritis.   NEUROLOGIC: No tingling, numbness, weakness.  PSYCHIATRY: No anxiety or depression.   MEDICATIONS AT HOME:  Prior to Admission medications   Medication Sig Start Date End Date Taking? Authorizing Provider  etonogestrel (NEXPLANON) 68 MG IMPL implant 1 each by Subdermal route once. Implanted Sept 2016   Yes [provider]  insulin aspart (NOVOLOG FLEXPEN) 100 UNIT/ML FlexPen Inject 4-10 Units into the skin 3 (three) times daily with meals. Based on carb count and CBG    Yes [provider]  Insulin Detemir (LEVEMIR FLEXTOUCH) 100 UNIT/ML Pen Inject 30 Units into the skin at bedtime.    Yes [provider]  ketorolac (TORADOL) 10 MG tablet Take 1 tablet (10 mg total) by mouth every 6 (six) hours as needed for moderate pain or severe pain. Patient not taking: Reported on 09/04/2017 03/17/17   Noralee Stain, DO      PHYSICAL EXAMINATION:   VITAL SIGNS: Blood pressure 131/74, pulse (!) 121, resp. rate 18,  height 5\' 8"  (1.727 m), weight 74.8 kg (165 lb), last menstrual period 08/31/2017, SpO2 100 %.  GENERAL:  22 y.o.-year-old patient lying in the bed with no acute distress.  EYES: Pupils equal, round, reactive to light and accommodation. No scleral icterus. Extraocular muscles intact.  HEENT: Head atraumatic, normocephalic. Oropharynx and nasopharynx clear.  NECK:  Supple, no jugular venous distention. No thyroid enlargement, no tenderness.  LUNGS: Normal breath sounds bilaterally, no wheezing, rales,rhonchi or crepitation. No  use of accessory muscles of respiration.  CARDIOVASCULAR: S1, S2 normal. No murmurs, rubs, or gallops.  ABDOMEN: Soft, nontender, nondistended. Bowel sounds present. No organomegaly or mass.  EXTREMITIES: No pedal edema, cyanosis, or clubbing.  NEUROLOGIC: Cranial nerves II through XII are intact. Muscle strength 5/5 in all extremities. Sensation intact. Gait not checked.  PSYCHIATRIC: The patient is alert and oriented x 3.  SKIN: No obvious rash, lesion, or ulcer.   LABORATORY PANEL:   CBC Recent Labs  Lab 09/04/17 1112  WBC 18.5*  HGB 11.3*  HCT 33.8*  PLT 326  MCV 91.6  MCH 30.5  MCHC 33.3  RDW 13.0   ------------------------------------------------------------------------------------------------------------------  Chemistries  Recent Labs  Lab 09/04/17 1112  NA 131*  K 3.2*  CL 99  CO2 16*  GLUCOSE 344*  BUN 6  CREATININE 0.61  CALCIUM 8.6*  AST 12*  ALT 10  ALKPHOS 105  BILITOT 1.1   ------------------------------------------------------------------------------------------------------------------ estimated creatinine clearance is 112.2 mL/min (by C-G formula based on SCr of 0.61 mg/dL). ------------------------------------------------------------------------------------------------------------------ No results for input(s): TSH, T4TOTAL, T3FREE, THYROIDAB in the last 72 hours.  Invalid input(s): FREET3   Coagulation profile No results for input(s): INR, PROTIME in the last 168 hours. ------------------------------------------------------------------------------------------------------------------- No results for input(s): DDIMER in the last 72 hours. -------------------------------------------------------------------------------------------------------------------  Cardiac Enzymes No results for input(s): CKMB, TROPONINI, MYOGLOBIN in the last 168 hours.  Invalid input(s):  CK ------------------------------------------------------------------------------------------------------------------ Invalid input(s): POCBNP  ---------------------------------------------------------------------------------------------------------------  Urinalysis    Component Value Date/Time   COLORURINE YELLOW (A) 09/04/2017 1112   APPEARANCEUR CLOUDY (A) 09/04/2017 1112   LABSPEC 1.021 09/04/2017 1112   PHURINE 6.0 09/04/2017 1112   GLUCOSEU >=500 (A) 09/04/2017 1112   GLUCOSEU >=1000 12/01/2010 1051   HGBUR MODERATE (A) 09/04/2017 1112   BILIRUBINUR NEGATIVE 09/04/2017 1112   KETONESUR 80 (A) 09/04/2017 1112   PROTEINUR 30 (A) 09/04/2017 1112   UROBILINOGEN 0.2 12/01/2010 1051   NITRITE NEGATIVE 09/04/2017 1112   LEUKOCYTESUR MODERATE (A) 09/04/2017 1112     RADIOLOGY: No results found.  EKG: Orders placed or performed during the hospital encounter of 03/06/17  . ED EKG  . ED EKG  . EKG 12-Lead  . EKG 12-Lead    IMPRESSION AND PLAN: *Acute sepsis secondary to UTI Sepsis protocol, empiric Rocephin, follow-up on cultures, IV fluids rehydration  *Acute hyperglycemia secondary to type 1 diabetes Exacerbated by noncompliance with medical management/dietary indiscretion, compounded by above Resume home regiment, IV fluids for rehydration, diabetic educator consulted for patient education, Lantus 20 units subcu x1 now, sliding scale insulin with Accu-Cheks per routine, check hemoglobin A1c to determine level control  *Acute UTI Plan of care as stated above  *Chronic noncompliance of medical management The importance of compliance was reiterated on admission     All the records are reviewed and case discussed with ED provider. Management plans discussed with the patient, family and they are in agreement.  CODE STATUS:full Code Status History    Date Active Date Inactive Code Status Order ID Comments User  Context   03/15/2017 0533 03/17/2017 1835 Full Code  [id number]  Eduard ClosKakrakandy, Arshad N, MD ED   03/06/2017 1014 03/07/2017 1726 Full Code [id number]  Pieter Partridgehatterjee, Srobona Tublu, MD ED   06/12/2015 1707 06/16/2015 2025 Full Code [id number]  Ramonita LabGouru, Aruna, MD Inpatient       TOTAL TIME TAKING CARE OF THIS PATIENT: 45 minutes.    Evelena AsaMontell D Daphnie Venturini M.D on 09/04/2017   Between 7am to 6pm - Pager - [phone]  After 6pm go to [email or web address] - password [email or web address]  [facility]  Office  [phone]  CC: Primary care physician; Corwin LevinsJohn, James W, MD   Note: This dictation was prepared with Dragon dictation along with smaller phrase technology. Any transcriptional errors that result from this process are unintentional.

## 2017-09-04 NOTE — Consult Note (Signed)
CODE SEPSIS - PHARMACY COMMUNICATION  **Broad Spectrum Antibiotics should be administered within 1 hour of Sepsis diagnosis**  Time Code Sepsis Called/Page Received: @1149   Antibiotics Ordered: ceftriaxone   Time of 1st antibiotic administration: @ 1227  Additional action taken by pharmacy: *NA  If necessary, Name of Provider/Nurse Contacted: *NA    Gardner CandleSheema M Ebone Patel ,PharmD Clinical Pharmacist  09/04/2017  12:31 PM

## 2017-09-04 NOTE — ED Triage Notes (Signed)
C/O emesis x 4 this morning.  Patient states she is concerned she is going into DKA

## 2017-09-04 NOTE — ED Provider Notes (Signed)
[facility] Emergency Department Provider Note  ____________________________________________   First MD Initiated Contact with Patient 09/04/17 1122     (approximate)  I have reviewed the triage vital signs and the nursing notes.   HISTORY  Chief Complaint Emesis   HPI Mandy Patel is a 22 y.o. female history of diabetes  Patient reports that yesterday she noticed that she was urinating a little bit more, she did not check her blood sugars yesterday and she ate fast food on several occasions.  The day before she checked her blood sugar in the morning it was about "204".  She does use insulin each evening long-acting about 27 to 30 units.  She uses sliding scale with meals occasionally, but only use it with one meal yesterday.  She reports this morning she woke up she felt nauseated and vomited 4 times.  Reports that she thinks she may be going into DKA.  She is had DKA in the past.  Denies any fevers or chills.  Denies that she felt ill yesterday other than urinating a little more than normal.  Last insulin use was her long-acting insulin last night.  She thinks her symptoms may be due to DKA as she had similar in the past.  Denies pain or burning with urination.  Denies pregnancy.  Denies any recent illnesses or infections.     Past Medical History:  Diagnosis Date  . Diabetes mellitus    type I, diagnosed age 48yo  . Pyelonephritis 03/2017    Patient Active Problem List   Diagnosis Date Noted  . Type 1 diabetes mellitus with hyperglycemia (HCC) 03/15/2017  . Acute pyelonephritis 03/15/2017  . Acute right flank pain 03/15/2017  . Influenza A 03/06/2017  . Vomiting 03/06/2017  . DKA (diabetic ketoacidoses) (HCC) 06/12/2015  . Type I (juvenile type) diabetes mellitus without mention of complication, uncontrolled 04/26/2011  . Preventative health care 12/01/2010  . Obesity 06/19/2010  . Goiter, unspecified 06/19/2010  .  HYPERCHOLESTEROLEMIA 04/25/2008    Past Surgical History:  Procedure Laterality Date  . EYE MUSCLE SURGERY  approx 2010   bilat eye surgury, left exotropia worse;Dr Maple Hudson, opthomology    Prior to Admission medications   Medication Sig Start Date End Date Taking? Authorizing Provider  etonogestrel (NEXPLANON) 68 MG IMPL implant 1 each by Subdermal route once. Implanted Sept 2016   Yes [provider]  insulin aspart (NOVOLOG FLEXPEN) 100 UNIT/ML FlexPen Inject 4-10 Units into the skin 3 (three) times daily with meals. Based on carb count and CBG    Yes [provider]  Insulin Detemir (LEVEMIR FLEXTOUCH) 100 UNIT/ML Pen Inject 30 Units into the skin at bedtime.    Yes [provider]  albuterol (PROVENTIL HFA;VENTOLIN HFA) 108 (90 Base) MCG/ACT inhaler Inhale 2 puffs into the lungs every 6 (six) hours as needed for wheezing or shortness of breath. Patient not taking: Reported on 03/14/2017 03/05/17   Faythe Ghee, PA-C  ketorolac (TORADOL) 10 MG tablet Take 1 tablet (10 mg total) by mouth every 6 (six) hours as needed for moderate pain or severe pain. Patient not taking: Reported on 09/04/2017 03/17/17   Noralee Stain, DO    Allergies Peanut oil and Other  Family History  Problem Relation Age of Onset  . Alcohol abuse Other   . Arthritis Other   . Hyperlipidemia Other   . Stroke Other   . Diabetes Other   . Hyperlipidemia Other   . Hypertension Other   .  Diabetes Other     Social History Social History   Tobacco Use  . Smoking status: Never Smoker  . Smokeless tobacco: Never Used  Substance Use Topics  . Alcohol use: No  . Drug use: Yes    Types: Marijuana    Review of Systems Constitutional: No fever/chills Eyes: No visual changes. ENT: No sore throat. Cardiovascular: Denies chest pain. Respiratory: Denies shortness of breath. Gastrointestinal: Some discomfort in her mid upper abdomen.  Also associate with some nausea and vomiting 4 times  this morning.  No diarrhea.  No constipation. Genitourinary: Negative for dysuria. Musculoskeletal: Negative for back pain. Skin: Negative for rash. Neurological: Negative for headaches, focal weakness or numbness.    ____________________________________________   PHYSICAL EXAM:  VITAL SIGNS: ED Triage Vitals [09/04/17 0945]  Enc Vitals Group     BP 131/74     Pulse Rate (!) 121     Resp 18     Temp      Temp src      SpO2 100 %     Weight 165 lb (74.8 kg)     Height 5\' 8"  (1.727 m)     Head Circumference      Peak Flow      Pain Score 8     Pain Loc      Pain Edu?      Excl. in GC?     Constitutional: Alert and oriented. Well appearing and in no acute distress. Eyes: Conjunctivae are normal. Head: Atraumatic. Nose: No congestion/rhinnorhea. Mouth/Throat: Mucous membranes are slightly dry. Neck: No stridor.   Cardiovascular: Normal rate, regular rhythm. Grossly normal heart sounds.  Good peripheral circulation. Respiratory: Normal respiratory effort.  No retractions. Lungs CTAB. Gastrointestinal: Soft and nontender except for some mild discomfort epigastrically without any rebound or guarding.  She reports she feels more nauseated than anything just a very small amount of discomfort in the upper abdomen.. No distention. Musculoskeletal: No lower extremity tenderness nor edema.  No CVA tenderness bilateral. Neurologic:  Normal speech and language. No gross focal neurologic deficits are appreciated.  Skin:  Skin is warm, dry and intact. No rash noted. Psychiatric: Mood and affect are normal. Speech and behavior are normal.  ____________________________________________   LABS (all labs ordered are listed, but only abnormal results are displayed)  Labs Reviewed  GLUCOSE, CAPILLARY - Abnormal; Notable for the following components:      Result Value   Glucose-Capillary 351 (*)    All other components within normal limits  BLOOD GAS, VENOUS - Abnormal; Notable for the  following components:   pCO2, Ven 28 (*)    Bicarbonate 15.5 (*)    Acid-base deficit 8.9 (*)    All other components within normal limits  COMPREHENSIVE METABOLIC PANEL - Abnormal; Notable for the following components:   Sodium 131 (*)    Potassium 3.2 (*)    CO2 16 (*)    Glucose, Bld 344 (*)    Calcium 8.6 (*)    Total Protein 8.2 (*)    Albumin 3.3 (*)    AST 12 (*)    Anion gap 16 (*)    All other components within normal limits  CBC - Abnormal; Notable for the following components:   WBC 18.5 (*)    RBC 3.68 (*)    Hemoglobin 11.3 (*)    HCT 33.8 (*)    All other components within normal limits  URINALYSIS, COMPLETE (UACMP) WITH MICROSCOPIC - Abnormal; Notable for the following  components:   Color, Urine YELLOW (*)    APPearance CLOUDY (*)    Glucose, UA >=500 (*)    Hgb urine dipstick MODERATE (*)    Ketones, ur 80 (*)    Protein, ur 30 (*)    Leukocytes, UA MODERATE (*)    WBC, UA >50 (*)    Bacteria, UA RARE (*)    All other components within normal limits  URINE CULTURE  CULTURE, BLOOD (ROUTINE X 2)  CULTURE, BLOOD (ROUTINE X 2)  LIPASE, BLOOD  BETA-HYDROXYBUTYRIC ACID  LACTIC ACID, PLASMA  LACTIC ACID, PLASMA  MAGNESIUM  POC URINE PREG, ED  POCT PREGNANCY, URINE   ____________________________________________  EKG   ____________________________________________  RADIOLOGY   ____________________________________________   PROCEDURES  Procedure(s) performed: None  Procedures  Critical Care performed: Yes, see critical care note(s)  CRITICAL CARE Performed by: Sharyn CreamerMark Hakeen Shipes   Total critical care time: 30 minutes  Critical care time was exclusive of separately billable procedures and treating other patients.  Critical care was necessary to treat or prevent imminent or life-threatening deterioration.  Critical care was time spent personally by me on the following activities: development of treatment plan with patient and/or surrogate as well  as nursing, discussions with consultants, evaluation of patient's response to treatment, examination of patient, obtaining history from patient or surrogate, ordering and performing treatments and interventions, ordering and review of laboratory studies, ordering and review of radiographic studies, pulse oximetry and re-evaluation of patient's condition.  ____________________________________________   INITIAL IMPRESSION / ASSESSMENT AND PLAN / ED COURSE  Pertinent labs & imaging results that were available during my care of the patient were reviewed by me and considered in my medical decision making (see chart for details).      ----------------------------------------- 11:48 AM on 09/04/2017 -----------------------------------------  In note of the patient's urinalysis with bacteria and evidence of pyuria, suspect the patient may have a urinary tract infection.  She has no lateralizing symptoms or back pain which indicates that she likely does not have pyelonephritis, but given her leukocytosis, tachycardia nausea vomiting and still pending BMP I am suspicious that she has urinary tract infection.  She meets sepsis criteria, will initiate Rocephin, await BMP before making further management decisions regarding potential need for IV insulin therapy.  ----------------------------------------- 12:17 PM on 09/04/2017 -----------------------------------------  Labs are resulted, bicarb 16, beta hydroxy pending.  pH 7.35, bicarb again low.  Suspect a very mild diabetic ketoacidosis is present, likely will correct with the fluids that have already been administered including the first liter of fluid and plan to recheck on her blood glucose now.  Patient's potassium slightly low, will replete potassium at this time, plan to and discussed admission with Dr. Katheren ShamsSalary for concerns of urinary tract infection and potential for sepsis thought to favor her leukocytosis may be more secondary to her diabetic  ketoacidosis.  She reports feeling improved, appears improved and is resting comfortably at this time after first liter of fluid, morphine and Zofran.  Patient agreeable with plan for admission.  ____________________________________________   FINAL CLINICAL IMPRESSION(S) / ED DIAGNOSES  Final diagnoses:  Urinary tract infection, acute  Sepsis, due to unspecified organism (HCC)  Diabetic ketoacidosis without coma associated with type 1 diabetes mellitus (HCC)      NEW MEDICATIONS STARTED DURING THIS VISIT:  New Prescriptions   No medications on file     Note:  This document was prepared using Dragon voice recognition software and may include unintentional dictation errors.  Sharyn Creamer, MD 09/04/17 1218

## 2017-09-05 LAB — BASIC METABOLIC PANEL
ANION GAP: 8 (ref 5–15)
CO2: 20 mmol/L — AB (ref 22–32)
Calcium: 8 mg/dL — ABNORMAL LOW (ref 8.9–10.3)
Chloride: 108 mmol/L (ref 98–111)
Creatinine, Ser: 0.46 mg/dL (ref 0.44–1.00)
GFR calc Af Amer: >60 mL/min (ref >60)
GLUCOSE: 134 mg/dL — AB (ref 70–99)
POTASSIUM: 4.3 mmol/L (ref 3.5–5.1)
Sodium: 136 mmol/L (ref 135–145)

## 2017-09-05 LAB — GLUCOSE, CAPILLARY
Glucose-Capillary: 81 mg/dL (ref 70–99)
Glucose-Capillary: 151 mg/dL — ABNORMAL HIGH (ref 70–99)

## 2017-09-05 MED ORDER — INSULIN DETEMIR 100 UNIT/ML FLEXPEN: 30.0000 [IU] | PEN_INJECTOR | Freq: Every day | SUBCUTANEOUS | 0 refills | Status: DC

## 2017-09-05 MED ORDER — BLOOD GLUCOSE MONITOR KIT: PACK | 0 refills | Status: DC

## 2017-09-05 MED ORDER — INSULIN ASPART 100 UNIT/ML ~~LOC~~ SOLN
0.0000 [IU] | Freq: Three times a day (TID) | SUBCUTANEOUS | Status: DC
  Administered 2017-09-05: 2 [IU] via SUBCUTANEOUS
  Filled 2017-09-05: qty 1

## 2017-09-05 MED ORDER — CEPHALEXIN 500 MG PO CAPS: 500.0000 mg | ORAL_CAPSULE | Freq: Three times a day (TID) | ORAL | 0 refills | Status: AC

## 2017-09-05 MED ORDER — LIVING WELL WITH DIABETES BOOK
Freq: Once | Status: AC
  Administered 2017-09-05: 10:00:00
  Filled 2017-09-05: qty 1

## 2017-09-05 MED ORDER — INSULIN ASPART 100 UNIT/ML ~~LOC~~ SOLN
4.0000 [IU] | Freq: Three times a day (TID) | SUBCUTANEOUS | Status: DC
  Administered 2017-09-05: 4 [IU] via SUBCUTANEOUS
  Filled 2017-09-05: qty 1

## 2017-09-05 MED ORDER — INSULIN LISPRO 100 UNIT/ML ~~LOC~~ SOLN: SUBCUTANEOUS | 0 refills | Status: DC

## 2017-09-05 MED ORDER — INSULIN LISPRO 100 UNIT/ML ~~LOC~~ SOLN: 4.0000 [IU] | Freq: Three times a day (TID) | SUBCUTANEOUS | 0 refills | Status: DC

## 2017-09-05 NOTE — Discharge Instructions (Signed)
Antibiotic Medicine, Adult  Antibiotic medicines treat infections caused by a type of germ called bacteria. They work by killing the bacteria that make you sick.  When do I need to take antibiotics?  You often need these medicines to treat bacterial infections, such as:  · A urinary tract infection (UTI).  · Strep throat.  · Meningitis. This affects the spinal cord and brain.  · A bad lung infection.    You may start the medicines while your doctor waits for tests to come back. When the tests come back, your doctor may change or stop your medicine.  When are antibiotics not needed?  You do not need these medicines for most common illnesses, such as:  · A cold.  · The flu.  · A sore throat.    Antibiotics are not always needed for all infections caused by bacteria. Do not ask for these medicines, or take them, when they are not needed.  What are the risks of taking antibiotics?  Most antibiotics can cause an infection called Clostridium difficile.This causes watery poop (diarrhea). Let your doctor know right away if:  · You have watery poop while taking an antibiotic.  · You have watery poop after you stop taking an antibiotic. The illness can happen weeks after you stop the medicine.    You also have a risk of getting an infection in the future that antibiotics cannot treat (antibiotic-resistant infection). This type of infection can be dangerous.  What else should I know about taking antibiotics?  · You need to take the entire prescription.  ? Take the medicine for as long as told by your doctor.  ? Do not stop taking it even if you start to feel better.  · Try not to miss any doses. If you miss a dose, call your doctor.  · Birth control pills may not work. If you take birth control pills:  ? Keep on taking them.  ? Use a second form of birth control, such as a condom. Do this for as long as told by your doctor.  · Ask your doctor:  ? How long to wait in between doses.  ? If you should take the medicine with  food.  ? If there is anything you should stay away from while taking the antibiotic, such as:  ? Food.  ? Drinks.  ? Medicines.  ? If there are any side effects you should watch for.  · Only take the medicines that your doctor told you to take. Do not take medicines that were given to someone else.  · Drink a large glass of water with the medicine.  · Ask the pharmacist for a tool to measure the medicine, such as:  ? A syringe.  ? A cup.  ? A spoon.  · Throw away any extra medicine.  Contact a doctor if:  · You get worse.  · You have new joint pain or muscle aches after starting the medicine.  · You have side effects from the medicine, such as:  ? Stomach pain.  ? Watery poop.  ? Feeling sick to your stomach (nausea).  Get help right away if:  · You have signs of a very bad allergic reaction. If this happens, stop taking the medicine right away. Signs may include:  ? Hives. These are raised, itchy, red bumps on the skin.  ? Skin rash.  ? Trouble breathing.  ? Wheezing.  ? Swelling.  ? Feeling dizzy.  ? Throwing up (  vomiting).  · Your pee (urine) is dark, or is the color of blood.  · Your skin turns yellow.  · You bruise easily.  · You bleed easily.  · You have very bad watery poop and cramps in your belly.  · You have a very bad headache.  Summary  · Antibiotics are often used to treat infections caused by bacteria.  · Only take these medicines when needed.  · Let your doctor know if you have watery poop while taking an antibiotic.  · You need to take the entire prescription.  This information is not intended to replace advice given to you by your health care provider. Make sure you discuss any questions you have with your health care provider.  Document Released: 10/28/2007 Document Revised: 01/21/2016 Document Reviewed: 01/21/2016  [employer]vier Interactive Patient Education © 2017 [employer]

## 2017-09-05 NOTE — Discharge Summary (Signed)
[place] [place] NAME: Mandy Patel    MR#:  [id number]  DATE OF BIRTH:  Apr 20, 1995  DATE OF ADMISSION:  09/04/2017 ADMITTING PHYSICIAN: Mandy Harms, MD  DATE OF DISCHARGE: 09/05/2017  PRIMARY CARE PHYSICIAN: Mandy Borg, MD    ADMISSION DIAGNOSIS:  Urinary tract infection, acute [N39.0] Sepsis ([place]) [A41.9] Sepsis, due to unspecified organism [facility]) [A41.9] Diabetic ketoacidosis without coma associated with type 1 diabetes mellitus ([place]) [E10.10]  DISCHARGE DIAGNOSIS:  Active Problems:   Sepsis ([place])   SECONDARY DIAGNOSIS:   Past Medical History:  Diagnosis Date  . Diabetes mellitus    type I, diagnosed age 22yo  . Pyelonephritis 03/2017    HOSPITAL COURSE:   22 year old female with type 1 diabetes with A1c of 12.2 and medical noncompliance who presents with nausea and vomiting.  1.  Sepsis: Patient presented to the ER with tachycardia and leukocytosis.  Sepsis from urinary tract infection  2.  Urinary tract infection: Sepsis is resolved Patient has tolerated ceftriaxone and will be discharged on oral Keflex  3.  Uncontrolled diabetes with A1c of 12.2: I discussed the importance of close follow-up for her diabetes and making sure her blood sugars are better controlled. She has at high risk of multiple complications if she continues to have abnormally high levels of blood sugars as well as A1c.   DISCHARGE CONDITIONS AND DIET:   Stable for discharge on diabetic diet  CONSULTS OBTAINED:    DRUG ALLERGIES:   Allergies  Allergen Reactions  . Peanut Oil Other (See Comments)    Reaction to peanuts - gums feel numb and tingling  . Other Swelling    Pecans caused throat swelling and weakness    DISCHARGE MEDICATIONS:   Allergies as of 09/05/2017      Reactions   Peanut Oil Other (See Comments)   Reaction to peanuts - gums feel numb and tingling   Other Swelling   Pecans caused throat swelling and weakness       Medication List    STOP taking these medications   ketorolac 10 MG tablet Commonly known as:  TORADOL     TAKE these medications   blood glucose meter kit and supplies Kit Dispense based on patient and insurance preference. Use up to four times daily as directed. (FOR ICD-9 250.00, 250.01).   cephALEXin 500 MG capsule Commonly known as:  KEFLEX Take 1 capsule (500 mg total) by mouth 3 (three) times daily for 6 days.   etonogestrel 68 MG Impl implant Commonly known as:  NEXPLANON 1 each by Subdermal route once. Implanted Sept 2016   LEVEMIR FLEXTOUCH 100 UNIT/ML Pen Generic drug:  Insulin Detemir Inject 30 Units into the skin at bedtime.   NOVOLOG FLEXPEN 100 UNIT/ML FlexPen Generic drug:  insulin aspart Inject 4-10 Units into the skin 3 (three) times daily with meals. Based on carb count and CBG         Today   CHIEF COMPLAINT:   Patient doing ok this am not been good about testing her blood sugars and complinace with insulin.   VITAL SIGNS:  Blood pressure 117/72, pulse (!) 102, temperature 98.8 F (37.1 C), temperature source Oral, resp. rate (!) 22, height 5' 8"  (1.727 m), weight 157 lb 10.1 oz (71.5 kg), last menstrual period 08/31/2017, SpO2 100 %.   REVIEW OF SYSTEMS:  Review of Systems  Constitutional: Negative.  Negative for chills, fever and malaise/fatigue.  HENT: Negative.  Negative for ear discharge, ear pain, hearing loss, nosebleeds and sore throat.   Eyes: Negative.  Negative for blurred vision and pain.  Respiratory: Negative.  Negative for cough, hemoptysis, shortness of breath and wheezing.   Cardiovascular: Negative.  Negative for chest pain, palpitations and leg swelling.  Gastrointestinal: Negative.  Negative for abdominal pain, blood in stool, diarrhea, nausea and vomiting.  Genitourinary: Negative.  Negative for dysuria.  Musculoskeletal: Negative.  Negative for back pain.  Skin: Negative.   Neurological: Negative for dizziness,  tremors, speech change, focal weakness, seizures and headaches.  Endo/Heme/Allergies: Negative.  Does not bruise/bleed easily.  Psychiatric/Behavioral: Negative.  Negative for depression, hallucinations and suicidal ideas.     PHYSICAL EXAMINATION:  GENERAL:  22 y.o.-year-old patient lying in the bed with no acute distress.  NECK:  Supple, no jugular venous distention. No thyroid enlargement, no tenderness.  LUNGS: Normal breath sounds bilaterally, no wheezing, rales,rhonchi  No use of accessory muscles of respiration.  CARDIOVASCULAR: S1, S2 normal. No murmurs, rubs, or gallops.  ABDOMEN: Soft, non-tender, non-distended. Bowel sounds present. No organomegaly or mass.  EXTREMITIES: No pedal edema, cyanosis, or clubbing.  PSYCHIATRIC: The patient is alert and oriented x 3.  SKIN: No obvious rash, lesion, or ulcer.   DATA REVIEW:   CBC Recent Labs  Lab 09/04/17 1112  WBC 18.5*  HGB 11.3*  HCT 33.8*  PLT 326    Chemistries  Recent Labs  Lab 09/04/17 1112 09/04/17 1536 09/05/17 0419  NA 131* 134* 136  K 3.2* 3.4* 4.3  CL 99 105 108  CO2 16* 15* 20*  GLUCOSE 344* 251* 134*  BUN 6 <5* <5*  CREATININE 0.61 0.51 0.46  CALCIUM 8.6* 7.8* 8.0*  MG 2.0 1.6*  --   AST 12*  --   --   ALT 10  --   --   ALKPHOS 105  --   --   BILITOT 1.1  --   --     Cardiac Enzymes No results for input(s): TROPONINI in the last 168 hours.  Microbiology Results  @MICRORSLT48 @  RADIOLOGY:  Dg Chest Port 1 View  Result Date: 09/04/2017 CLINICAL DATA:  Sepsis.  Nausea and vomiting.  Diabetes. EXAM: PORTABLE CHEST 1 VIEW COMPARISON:  03/06/2017 FINDINGS: The heart size and mediastinal contours are within normal limits. Both lungs are clear. Azygos fissure incidentally noted. The visualized skeletal structures are unremarkable. IMPRESSION: No active disease. Electronically Signed   By: Earle Gell M.D.   On: 09/04/2017 14:01      Allergies as of 09/05/2017      Reactions   Peanut Oil Other  (See Comments)   Reaction to peanuts - gums feel numb and tingling   Other Swelling   Pecans caused throat swelling and weakness      Medication List    STOP taking these medications   ketorolac 10 MG tablet Commonly known as:  TORADOL     TAKE these medications   blood glucose meter kit and supplies Kit Dispense based on patient and insurance preference. Use up to four times daily as directed. (FOR ICD-9 250.00, 250.01).   cephALEXin 500 MG capsule Commonly known as:  KEFLEX Take 1 capsule (500 mg total) by mouth 3 (three) times daily for 6 days.   etonogestrel 68 MG Impl implant Commonly known as:  NEXPLANON 1 each by Subdermal route once. Implanted Sept 2016   LEVEMIR FLEXTOUCH 100 UNIT/ML Pen Generic drug:  Insulin Detemir Inject 30 Units into the  skin at bedtime.   NOVOLOG FLEXPEN 100 UNIT/ML FlexPen Generic drug:  insulin aspart Inject 4-10 Units into the skin 3 (three) times daily with meals. Based on carb count and CBG          Management plans discussed with the patient and she is in agreement. Stable for discharge home  Patient should follow up with pcp  CODE STATUS:     Code Status Orders  (From admission, onward)        Start     Ordered   09/04/17 1432  Full code  Continuous     09/04/17 1431    Code Status History    Date Active Date Inactive Code Status Order ID Comments User Context   03/15/2017 0533 03/17/2017 1835 Full Code [id number]  Rise Patience, MD ED   03/06/2017 1014 03/07/2017 1726 Full Code [id number]  Vashti Hey, MD ED   06/12/2015 1707 06/16/2015 2025 Full Code [id number]  Nicholes Mango, MD Inpatient      TOTAL TIME TAKING CARE OF THIS PATIENT: 38 minutes.    Note: This dictation was prepared with Dragon dictation along with smaller phrase technology. Any transcriptional errors that result from this process are unintentional.  Bairon Klemann M.D on 09/05/2017 at 10:16 AM  Between 7am to 6pm - Pager -  [phone] After 6pm go to [email or web address] - password [email or web address] [facility]  Office  [phone]  CC: Primary care physician; Mandy Borg, MD

## 2017-09-05 NOTE — Progress Notes (Addendum)
Inpatient Diabetes Program Recommendations  AACE/ADA: New Consensus Statement on Inpatient Glycemic Control (2019)  Target Ranges:  Prepandial:   less than 140 mg/dL      Peak postprandial:   less than 180 mg/dL (1-2 hours)      Critically ill patients:  140 - 180 mg/dL  Results for Mandy Patel, Mandy Patel (MRN [id number]) as of 09/05/2017 07:41  Ref. Range 09/04/2017 09:49 09/04/2017 12:40 09/04/2017 16:36 09/04/2017 21:56 09/05/2017 07:47  Glucose-Capillary Latest Ref Range: 70 - 99 mg/dL 409 (H) 811 (H) 914 (H)  Novolog 15 units @17 :07 167 (H)  Levemir 30 units @ 22:21 81   Results for Mandy Patel, Mandy Patel (MRN [id number]) as of 09/05/2017 07:41  Ref. Range 09/04/2017 15:36  Hemoglobin A1C Latest Ref Range: 4.8 - 5.6 % 12.2 (H)   Review of Glycemic Control  Diabetes history: DM1 (makes no insulin; requires basal, correction, and meal coverage insulin) Outpatient Diabetes medications: Levemir 30 units QHS, Novolog 4-10 units TID with meals Current orders for Inpatient glycemic control: Levemir 30 units QHS, Novolog 10 units TID with meals for meal coverage, Novolog 0-15 units TID with meals, Novolog 0-5 units QHS  Inpatient Diabetes Program Recommendations:  Insulin-Correction: Please consider decreasing Novolog correction to sensitive scale (0-9 units TID). Insulin-Meal Coverage: Please decrease meal coverage to Novolog 4 units TID with meals for meal coverage. HgbA1C: A1C 12.2% on 09/04/17 indicating an average glucose of 303 mg/dl over the past 2-3 months. Will plan to talk with patient today.  Addendum 09/05/17@12 :00-Spoke with patient about diabetes and home regimen for diabetes control. Patient reports that she does not have any insurance. Patient reports that she is taking Levemir 30 units QHS and Novolog TID with meals (based on glucose and carbohydrates) as an outpatient for diabetes control.  Inquired how she was getting insulin and patient states that her mother has DM as well and she is using her insulin  and testing supplies.  Patient reports that she is trying to manage her DM on her own because she has no insurance and can not see an Endocrinologist regularly. Patient reports that she lives in [place]. Since patient does not live in [facility] she would not be eligible to go to the [facility] to get care and medications consistently. Informed patient about [facility]) and encouraged patient to establish care at the [facility] so she can get care and medications consistently.  Patient reports that her glucose is usually above 200 mg/dl most of the time. Discussed A1C results (12.2% on 09/04/16) and explained that her current A1C indicates an average glucose of 303 mg/dl over the past 2-3 months. Discussed glucose and A1C goals. Discussed importance of checking CBGs and maintaining good CBG control to prevent long-term and short-term complications. Explained how hyperglycemia leads to damage within blood vessels which lead to the common complications seen with uncontrolled diabetes. Stressed to the patient the importance of improving glycemic control to prevent further complications from uncontrolled diabetes. Discussed impact of nutrition, exercise, stress, sickness, and medications on diabetes control. Patient admits that she drinks sugary beverages (juice and regular sodas) and that she may not be counting carbohydrates correctly.  Discussed carbohydrates, portion sizes, and importance of covering carbohydrates correctly. Discussed using apps on phone (such as Cal[employer] King) to help with carbohydrate counting so they can be covered appropriately.  Encouraged patient to try to eliminate or cut down on sugary beverage intake. Stressed to patient that  she was only 22 years old and that is was imperative that she get DM better controlled and encouraged her to get established with [facility] so she could have [facility] doctor to help her with DM  management. Patient verbalized understanding of information discussed and she states that she has no further questions at this time related to diabetes.  Thanks, Orlando PennerMarie Ashawn Rinehart, RN, MSN, CDE Diabetes Coordinator Inpatient Diabetes Program [phone] (Team Pager from 8am to 5pm)

## 2017-09-05 NOTE — Care Management Note (Signed)
Case Management Note  Patient Details  Name: Mandy Patel MRN: [id number] Date of Birth: February 15, 1995   Patient to discharge home today.  HX type 1 Dm.  Patient lives at home with mother.  She is not employed, and does not have health insurance. Patient states that she follows up with Cathlean Cower and has to pay out of pocket for each visit.  Patient states that that she uses her mothers glucometer to check her sugars and "I use her insulin too since she is a diabetic".  Patient to discharge on humalog, Levimer, and keflex. RNCM confirmed with Medication Management  That they will be able to provide all medication at no charge. Patient to pick up prescriptions after discharge.  Patient is aware of the information that she needs to submit to [facility] in order to continue getting her medication.  [facility]  Was also discussed.    Patient provided charity glucometer kit   RNCM signing off.  Subjective/Objective:                    Action/Plan:   Expected Discharge Date:  09/05/17               Expected Discharge Plan:  Home/Self Care  In-House Referral:     Discharge [facility], Medication Assistance  Post Acute Care Choice:    Choice offered to:     DME Arranged:    DME Agency:     HH Arranged:    [place] Agency:     Status of Service:  Completed, signed off  If discussed at [employer], dates discussed:    Additional Comments:  Beverly Sessions, RN 09/05/2017, 11:59 AM

## 2017-09-05 NOTE — Progress Notes (Signed)
Nsg Discharge Note  Admit Date:  09/04/2017 Discharge date: 09/05/2017   Nhyira A Polcyn to be D/C'd Home per MD order.  AVS completed.  Copy for chart, and copy for patient signed, and dated. Patient/caregiver able to verbalize understanding.  Discharge Medication: Allergies as of 09/05/2017      Reactions   Peanut Oil Other (See Comments)   Reaction to peanuts - gums feel numb and tingling   Other Swelling   Pecans caused throat swelling and weakness      Medication List    STOP taking these medications   ketorolac 10 MG tablet Commonly known as:  TORADOL   NOVOLOG FLEXPEN 100 UNIT/ML FlexPen Generic drug:  insulin aspart     TAKE these medications   blood glucose meter kit and supplies Kit Dispense based on patient and insurance preference. Use up to four times daily as directed. (FOR ICD-9 250.00, 250.01).   cephALEXin 500 MG capsule Commonly known as:  KEFLEX Take 1 capsule (500 mg total) by mouth 3 (three) times daily for 6 days.   etonogestrel 68 MG Impl implant Commonly known as:  NEXPLANON 1 each by Subdermal route once. Implanted Sept 2016   Insulin Detemir 100 UNIT/ML Pen Commonly known as:  LEVEMIR FLEXTOUCH Inject 30 Units into the skin at bedtime.   insulin lispro 100 UNIT/ML injection Commonly known as:  HUMALOG Take per sliding scale   insulin lispro 100 UNIT/ML injection Commonly known as:  HUMALOG Inject 0.04 mLs (4 Units total) into the skin 3 (three) times daily with meals.       Discharge Assessment: Vitals:   09/04/17 2022 09/05/17 0529  BP: 114/69 117/72  Pulse: (!) 115 (!) 102  Resp: 20 (!) 22  Temp: 99.6 F (37.6 C) 98.8 F (37.1 C)  SpO2: 99% 100%   Skin clean, dry and intact without evidence of skin break down, no evidence of skin tears noted. IV catheter discontinued intact. Site without signs and symptoms of complications - no redness or edema noted at insertion site, patient denies c/o pain - only slight tenderness at site.   Dressing with slight pressure applied.  D/c Instructions-Education: Discharge instructions given to patient/family with verbalized understanding. D/c education completed with patient/family including follow up instructions, medication list, d/c activities limitations if indicated, with other d/c instructions as indicated by MD - patient able to verbalize understanding, all questions fully answered. Patient instructed to return to ED, call 911, or call MD for any changes in condition.  Patient escorted via [place], and D/C home via private auto.  Eda Keys, RN 09/05/2017 12:29 PM

## 2017-09-06 LAB — URINE CULTURE
Culture: >=100000 — AB
Special Requests: NORMAL

## 2017-09-08 ENCOUNTER — Inpatient Hospital Stay: Payer: Self-pay | Admitting: Internal Medicine

## 2017-09-08 DIAGNOSIS — Z0289 Encounter for other administrative examinations: Secondary | ICD-10-CM

## 2017-09-09 LAB — CULTURE, BLOOD (ROUTINE X 2)
CULTURE: NO GROWTH
Culture: NO GROWTH
Culture: NO GROWTH
Culture: NO GROWTH
Special Requests: ADEQUATE
Special Requests: ADEQUATE
Special Requests: ADEQUATE

## 2018-03-24 ENCOUNTER — Other Ambulatory Visit: Payer: Self-pay

## 2018-03-24 DIAGNOSIS — E1065 Type 1 diabetes mellitus with hyperglycemia: Secondary | ICD-10-CM | POA: Insufficient documentation

## 2018-03-24 DIAGNOSIS — N390 Urinary tract infection, site not specified: Secondary | ICD-10-CM | POA: Insufficient documentation

## 2018-03-24 DIAGNOSIS — Z794 Long term (current) use of insulin: Secondary | ICD-10-CM | POA: Insufficient documentation

## 2018-03-24 LAB — URINALYSIS, COMPLETE (UACMP) WITH MICROSCOPIC
Bilirubin Urine: NEGATIVE
Glucose, UA: >=500 mg/dL — AB
Ketones, ur: 80 mg/dL — AB
NITRITE: POSITIVE — AB
Protein, ur: 30 mg/dL — AB
SPECIFIC GRAVITY, URINE: 1.022 (ref 1.005–1.030)
WBC, UA: >50 WBC/hpf — ABNORMAL HIGH (ref 0–5)
pH: 5 (ref 5.0–8.0)

## 2018-03-24 LAB — COMPREHENSIVE METABOLIC PANEL
ALBUMIN: 4.1 g/dL (ref 3.5–5.0)
ALT: 9 U/L (ref 0–44)
AST: 8 U/L — AB (ref 15–41)
Alkaline Phosphatase: 77 U/L (ref 38–126)
Anion gap: 10 (ref 5–15)
BUN: 6 mg/dL (ref 6–20)
CHLORIDE: 100 mmol/L (ref 98–111)
CO2: 22 mmol/L (ref 22–32)
Calcium: 9.1 mg/dL (ref 8.9–10.3)
Creatinine, Ser: 0.45 mg/dL (ref 0.44–1.00)
GFR calc Af Amer: >60 mL/min (ref >60)
GLUCOSE: 198 mg/dL — AB (ref 70–99)
POTASSIUM: 3.9 mmol/L (ref 3.5–5.1)
SODIUM: 132 mmol/L — AB (ref 135–145)
Total Bilirubin: 0.6 mg/dL (ref 0.3–1.2)
Total Protein: 8.4 g/dL — ABNORMAL HIGH (ref 6.5–8.1)

## 2018-03-24 LAB — CBC
HEMATOCRIT: 36.3 % (ref 36.0–46.0)
HEMOGLOBIN: 11.8 g/dL — AB (ref 12.0–15.0)
MCH: 30 pg (ref 26.0–34.0)
MCHC: 32.5 g/dL (ref 30.0–36.0)
MCV: 92.4 fL (ref 80.0–100.0)
Platelets: 330 10*3/uL (ref 150–400)
RBC: 3.93 MIL/uL (ref 3.87–5.11)
RDW: 12.4 % (ref 11.5–15.5)
WBC: 14.5 10*3/uL — ABNORMAL HIGH (ref 4.0–10.5)
nRBC: 0 % (ref 0.0–0.2)

## 2018-03-24 LAB — GLUCOSE, CAPILLARY: GLUCOSE-CAPILLARY: 179 mg/dL — AB (ref 70–99)

## 2018-03-24 LAB — POCT PREGNANCY, URINE: PREG TEST UR: NEGATIVE

## 2018-03-24 NOTE — ED Triage Notes (Signed)
Pt states left sided lower abd pain that began on Monday with associated nausea and vomiting. Last bowel movement Wednesday. Pt denies known fever. Pt denies vaginal discharge and bleeding.

## 2018-03-25 ENCOUNTER — Emergency Department
Admission: EM | Admit: 2018-03-25 | Discharge: 2018-03-25 | Disposition: A | Discharge status: Home / Self Care | Payer: Self-pay | Attending: Emergency Medicine | Admitting: Emergency Medicine

## 2018-03-25 ENCOUNTER — Emergency Department: Payer: Self-pay

## 2018-03-25 DIAGNOSIS — N390 Urinary tract infection, site not specified: Secondary | ICD-10-CM

## 2018-03-25 DIAGNOSIS — R1032 Left lower quadrant pain: Secondary | ICD-10-CM

## 2018-03-25 DIAGNOSIS — R739 Hyperglycemia, unspecified: Secondary | ICD-10-CM

## 2018-03-25 LAB — LACTIC ACID, PLASMA: Lactic Acid, Venous: 0.7 mmol/L (ref 0.5–1.9)

## 2018-03-25 MED ORDER — ONDANSETRON HCL 4 MG/2ML IJ SOLN
4.0000 mg | Freq: Once | INTRAMUSCULAR | Status: AC
  Administered 2018-03-25: 4 mg via INTRAVENOUS
  Filled 2018-03-25: qty 2

## 2018-03-25 MED ORDER — SODIUM CHLORIDE 0.9 % IV BOLUS
1000.0000 mL | Freq: Once | INTRAVENOUS | Status: AC
  Administered 2018-03-25: 1000 mL via INTRAVENOUS

## 2018-03-25 MED ORDER — CEFTRIAXONE SODIUM 1 G IJ SOLR
1.0000 g | Freq: Once | INTRAMUSCULAR | Status: AC
  Administered 2018-03-25: 1 g via INTRAVENOUS
  Filled 2018-03-25: qty 10

## 2018-03-25 MED ORDER — MORPHINE SULFATE (PF) 4 MG/ML IV SOLN
4.0000 mg | Freq: Once | INTRAVENOUS | Status: AC
  Administered 2018-03-25: 4 mg via INTRAVENOUS
  Filled 2018-03-25: qty 1

## 2018-03-25 MED ORDER — KETOROLAC TROMETHAMINE 30 MG/ML IJ SOLN
10.0000 mg | Freq: Once | INTRAMUSCULAR | Status: AC
  Administered 2018-03-25: 9.9 mg via INTRAVENOUS

## 2018-03-25 MED ORDER — CEPHALEXIN 500 MG PO CAPS: 500.0000 mg | ORAL_CAPSULE | Freq: Three times a day (TID) | ORAL | 0 refills | Status: DC

## 2018-03-25 NOTE — ED Provider Notes (Signed)
[facility] Emergency Department Provider Note   ____________________________________________   First MD Initiated Contact with Patient 03/25/18 0030     (approximate)  I have reviewed the triage vital signs and the nursing notes.   HISTORY  Chief Complaint Abdominal Pain    HPI Mandy Patel is a 23 y.o. female who presents to the ED from home with a chief complaint of left lower abdominal pain.  Patient reports symptoms x4 to 5 days.  Now with nausea and vomiting.  Denies associated fever, chills, chest pain, shortness of breath, dysuria, vaginal discharge, diarrhea.  Denies STD exposure.  Denies recent travel or trauma.  States her blood sugars have been high.  History of DKA.    Past Medical History:  Diagnosis Date  . Diabetes mellitus    type I, diagnosed age 48yo  . Pyelonephritis 03/2017    Patient Active Problem List   Diagnosis Date Noted  . Sepsis ([place]) 09/04/2017  . Type 1 diabetes mellitus with hyperglycemia ([place]) 03/15/2017  . Acute pyelonephritis 03/15/2017  . Acute right flank pain 03/15/2017  . Influenza A 03/06/2017  . Vomiting 03/06/2017  . DKA (diabetic ketoacidoses) ([place]) 06/12/2015  . Type I (juvenile type) diabetes mellitus without mention of complication, uncontrolled 04/26/2011  . Preventative health care 12/01/2010  . Obesity 06/19/2010  . Goiter, unspecified 06/19/2010  . HYPERCHOLESTEROLEMIA 04/25/2008    Past Surgical History:  Procedure Laterality Date  . EYE MUSCLE SURGERY  approx 2010   bilat eye surgury, left exotropia worse;Dr Annamaria Boots, opthomology    Prior to Admission medications   Medication Sig Start Date End Date Taking? Authorizing Provider  blood glucose meter kit and supplies KIT Dispense based on patient and insurance preference. Use up to four times daily as directed. (FOR ICD-9 250.00, 250.01). 09/05/17   Bettey Costa, MD  cephALEXin (KEFLEX) 500 MG capsule Take 1 capsule (500 mg total) by mouth 3  (three) times daily. 03/25/18   Paulette Blanch, MD  etonogestrel (NEXPLANON) 68 MG IMPL implant 1 each by Subdermal route once. Implanted Sept 2016    [provider]  Insulin Detemir (LEVEMIR FLEXTOUCH) 100 UNIT/ML Pen Inject 30 Units into the skin at bedtime. 09/05/17   Bettey Costa, MD  insulin lispro (HUMALOG) 100 UNIT/ML injection Take per sliding scale 09/05/17 09/05/18  Bettey Costa, MD  insulin lispro (HUMALOG) 100 UNIT/ML injection Inject 0.04 mLs (4 Units total) into the skin 3 (three) times daily with meals. 09/05/17   Bettey Costa, MD    Allergies Peanut oil and Other  Family History  Problem Relation Age of Onset  . Alcohol abuse Other   . Arthritis Other   . Hyperlipidemia Other   . Stroke Other   . Diabetes Other   . Hyperlipidemia Other   . Hypertension Other   . Diabetes Other     Social History Social History   Tobacco Use  . Smoking status: Never Smoker  . Smokeless tobacco: Never Used  Substance Use Topics  . Alcohol use: No  . Drug use: Yes    Types: Marijuana    Review of Systems  Constitutional: No fever/chills Eyes: No visual changes. ENT: No sore throat. Cardiovascular: Denies chest pain. Respiratory: Denies shortness of breath. Gastrointestinal: Positive for abdominal pain, nausea and vomiting.  No diarrhea.  No constipation. Genitourinary: Negative for dysuria. Musculoskeletal: Negative for back pain. Skin: Negative for rash. Neurological: Negative for headaches, focal weakness or numbness.   ____________________________________________   PHYSICAL  EXAM:  VITAL SIGNS: ED Triage Vitals  Enc Vitals Group     BP 03/24/18 2156 (!) 154/94     Pulse Rate 03/24/18 2156 (!) 110     Resp 03/24/18 2156 16     Temp 03/24/18 2156 98.1 F (36.7 C)     Temp Source 03/24/18 2156 Oral     SpO2 03/24/18 2156 100 %     Weight 03/24/18 2157 163 lb (73.9 kg)     Height 03/24/18 2157 5' 8"  (1.727 m)     Head Circumference --      Peak Flow --       Pain Score 03/24/18 2156 10     Pain Loc --      Pain Edu? --      Excl. in August? --     Constitutional: Alert and oriented. Well appearing and in mild to moderate acute distress. Eyes: Conjunctivae are normal. PERRL. EOMI. Head: Atraumatic. Nose: No congestion/rhinnorhea. Mouth/Throat: Mucous membranes are moist.  Oropharynx non-erythematous. Neck: No stridor.   Cardiovascular: Tachycardic rate, regular rhythm. Grossly normal heart sounds.  Good peripheral circulation. Respiratory: Normal respiratory effort.  No retractions. Lungs CTAB. Gastrointestinal: Soft and mildly tender to palpation left lower quadrant without rebound or guarding. No distention. No abdominal bruits. No CVA tenderness. Musculoskeletal: No lower extremity tenderness nor edema.  No joint effusions. Neurologic:  Normal speech and language. No gross focal neurologic deficits are appreciated. No gait instability. Skin:  Skin is warm, dry and intact. No rash noted. Psychiatric: Mood and affect are normal. Speech and behavior are normal.  ____________________________________________   LABS (all labs ordered are listed, but only abnormal results are displayed)  Labs Reviewed  COMPREHENSIVE METABOLIC PANEL - Abnormal; Notable for the following components:      Result Value   Sodium 132 (*)    Glucose, Bld 198 (*)    Total Protein 8.4 (*)    AST 8 (*)    All other components within normal limits  CBC - Abnormal; Notable for the following components:   WBC 14.5 (*)    Hemoglobin 11.8 (*)    All other components within normal limits  URINALYSIS, COMPLETE (UACMP) WITH MICROSCOPIC - Abnormal; Notable for the following components:   Color, Urine YELLOW (*)    APPearance CLOUDY (*)    Glucose, UA >=500 (*)    Hgb urine dipstick SMALL (*)    Ketones, ur 80 (*)    Protein, ur 30 (*)    Nitrite POSITIVE (*)    Leukocytes,Ua MODERATE (*)    WBC, UA >50 (*)    Bacteria, UA MANY (*)    All other components within  normal limits  GLUCOSE, CAPILLARY - Abnormal; Notable for the following components:   Glucose-Capillary 179 (*)    All other components within normal limits  CULTURE, BLOOD (ROUTINE X 2)  CULTURE, BLOOD (ROUTINE X 2)  URINE CULTURE  LACTIC ACID, PLASMA  LACTIC ACID, PLASMA  POC URINE PREG, ED  POCT PREGNANCY, URINE   ____________________________________________  EKG  None ____________________________________________  RADIOLOGY  ED MD interpretation: No stones; likely pyelonephritis  Official radiology report(s): Ct Renal Stone Study  Result Date: 03/25/2018 CLINICAL DATA:  23 year old female with left lower abdominal pain. EXAM: CT ABDOMEN AND PELVIS WITHOUT CONTRAST TECHNIQUE: Multidetector CT imaging of the abdomen and pelvis was performed following the standard protocol without IV contrast. COMPARISON:  CT of the abdomen pelvis dated 03/13/2017 FINDINGS: Evaluation of this exam is limited  in the absence of intravenous contrast. Lower chest: The visualized lung bases are clear. There is hypoattenuation of the cardiac blood pool suggestive of a degree of anemia. Clinical correlation is recommended. No intra-abdominal free air. No free fluid. Hepatobiliary: No focal liver abnormality is seen. No gallstones, gallbladder wall thickening, or biliary dilatation. Pancreas: Unremarkable. No pancreatic ductal dilatation or surrounding inflammatory changes. Spleen: Normal in size without focal abnormality. Adrenals/Urinary Tract: The adrenal glands are unremarkable. There is mild fullness of the renal collecting systems bilaterally. Mild bilateral perinephric and periureteric stranding. No stone identified. Findings may represent UTI/pyelonephritis. Correlation with urinalysis recommended. The urinary bladder is unremarkable. Stomach/Bowel: There is no bowel obstruction or active inflammation. Normal appendix. Vascular/Lymphatic: The abdominal aorta and IVC are unremarkable. No portal venous gas.  There is no adenopathy. Reproductive: The uterus and ovaries are grossly unremarkable. No pelvic mass. Other: None Musculoskeletal: No acute or significant osseous findings. IMPRESSION: 1. Mild fullness of the renal collecting systems and ureters bilaterally. No stone identified. Findings may represent UTI/pyelonephritis. Correlation with urinalysis recommended. 2. No bowel obstruction or active inflammation. Normal appendix. Electronically Signed   By: Anner Crete M.D.   On: 03/25/2018 01:28    ____________________________________________   PROCEDURES  Procedure(s) performed: None  Procedures  Critical Care performed: No  ____________________________________________   INITIAL IMPRESSION / ASSESSMENT AND PLAN / ED COURSE  As part of my medical decision making, I reviewed the following data within the [facility] notes reviewed and incorporated, Labs reviewed, Old chart reviewed, Radiograph reviewed and Notes from prior ED visits    23 year old female with diabetes presenting with left lower quadrant abdominal pain, nausea and vomiting. Differential diagnosis includes, but is not limited to, ovarian cyst, ovarian torsion, acute appendicitis, diverticulitis, urinary tract infection/pyelonephritis, endometriosis, bowel obstruction, colitis, renal colic, gastroenteritis, hernia, fibroids, endometriosis, pregnancy related pain including ectopic pregnancy, etc.  Laboratory results remarkable for moderate leukocytosis, nitrite and leukocyte positive UTI.  Will obtain CT renal colic study.  Initiate IV fluid resuscitation, 4 mg IV morphine for pain, paired with 4 mg IV Zofran for nausea.  Administer 1 g IV Rocephin for UTI.   Clinical Course as of Mar 25 600  Sat Mar 25, 2018  0324 Patient sleeping soundly in no acute distress.  Heart rate normalized.  Lactic acid is unremarkable.  She is feeling significantly better.  Will discharge home on Keflex and she will  follow-up closely with her PCP.  Strict return precautions given.  Patient verbalizes understanding and agrees with plan of care.   [JS]    Clinical Course User Index [JS] Paulette Blanch, MD     ____________________________________________   FINAL CLINICAL IMPRESSION(S) / ED DIAGNOSES  Final diagnoses:  Left lower quadrant abdominal pain  Lower urinary tract infectious disease  Hyperglycemia     ED Discharge Orders         Ordered    cephALEXin (KEFLEX) 500 MG capsule  3 times daily     03/25/18 0325           Note:  This document was prepared using Dragon voice recognition software and may include unintentional dictation errors.   Paulette Blanch, MD 03/25/18 [phone]

## 2018-03-25 NOTE — Discharge Instructions (Signed)
1.  Take antibiotic as prescribed (Keflex 500 mg 3 times daily x 7 days). °2.  Drink plenty of fluids daily. °3.  Return to the ER for worsening symptoms, persistent vomiting, difficulty breathing or other concerns. °

## 2018-03-27 LAB — URINE CULTURE: Culture: >=100000 — AB

## 2018-03-28 LAB — BLOOD CULTURE ID PANEL (REFLEXED)
ACINETOBACTER BAUMANNII: NOT DETECTED
CANDIDA ALBICANS: NOT DETECTED
CANDIDA TROPICALIS: NOT DETECTED
Candida glabrata: NOT DETECTED
Candida krusei: NOT DETECTED
Candida parapsilosis: NOT DETECTED
Carbapenem resistance: NOT DETECTED
ENTEROBACTER CLOACAE COMPLEX: NOT DETECTED
ENTEROBACTERIACEAE SPECIES: DETECTED — AB
ENTEROCOCCUS SPECIES: NOT DETECTED
ESCHERICHIA COLI: DETECTED — AB
HAEMOPHILUS INFLUENZAE: NOT DETECTED
Klebsiella oxytoca: NOT DETECTED
Klebsiella pneumoniae: NOT DETECTED
LISTERIA MONOCYTOGENES: NOT DETECTED
NEISSERIA MENINGITIDIS: NOT DETECTED
PROTEUS SPECIES: NOT DETECTED
PSEUDOMONAS AERUGINOSA: NOT DETECTED
STREPTOCOCCUS AGALACTIAE: NOT DETECTED
STREPTOCOCCUS PNEUMONIAE: NOT DETECTED
STREPTOCOCCUS SPECIES: NOT DETECTED
Serratia marcescens: NOT DETECTED
Staphylococcus aureus (BCID): NOT DETECTED
Staphylococcus species: NOT DETECTED
Streptococcus pyogenes: NOT DETECTED

## 2018-03-30 ENCOUNTER — Telehealth: Payer: Self-pay | Admitting: Emergency Medicine

## 2018-03-30 LAB — CULTURE, BLOOD (ROUTINE X 2)
Culture: NO GROWTH
SPECIAL REQUESTS: ADEQUATE
Special Requests: ADEQUATE

## 2018-03-30 NOTE — Telephone Encounter (Signed)
Called patient to check on condition and follow up plans.  She had a positive blood culture, so I wanted to know how she was.  I left her a message indicating that and that I would like her to call me to let me know and that she certainly should return if she is feeling worse.

## 2018-05-31 ENCOUNTER — Emergency Department: Payer: Self-pay

## 2018-05-31 ENCOUNTER — Other Ambulatory Visit: Payer: Self-pay

## 2018-05-31 ENCOUNTER — Inpatient Hospital Stay
Admission: EM | Admit: 2018-05-31 | Discharge: 2018-06-01 | DRG: 637 | Disposition: A | Discharge status: Home / Self Care | Payer: Self-pay | Attending: Internal Medicine | Admitting: Internal Medicine

## 2018-05-31 DIAGNOSIS — Z79899 Other long term (current) drug therapy: Secondary | ICD-10-CM

## 2018-05-31 DIAGNOSIS — Z91010 Allergy to peanuts: Secondary | ICD-10-CM

## 2018-05-31 DIAGNOSIS — Z8249 Family history of ischemic heart disease and other diseases of the circulatory system: Secondary | ICD-10-CM

## 2018-05-31 DIAGNOSIS — Z833 Family history of diabetes mellitus: Secondary | ICD-10-CM

## 2018-05-31 DIAGNOSIS — N12 Tubulo-interstitial nephritis, not specified as acute or chronic: Secondary | ICD-10-CM

## 2018-05-31 DIAGNOSIS — R197 Diarrhea, unspecified: Secondary | ICD-10-CM

## 2018-05-31 DIAGNOSIS — R112 Nausea with vomiting, unspecified: Secondary | ICD-10-CM

## 2018-05-31 DIAGNOSIS — E7800 Pure hypercholesterolemia, unspecified: Secondary | ICD-10-CM | POA: Diagnosis present

## 2018-05-31 DIAGNOSIS — N10 Acute tubulo-interstitial nephritis: Secondary | ICD-10-CM | POA: Diagnosis present

## 2018-05-31 DIAGNOSIS — Z823 Family history of stroke: Secondary | ICD-10-CM

## 2018-05-31 DIAGNOSIS — E1010 Type 1 diabetes mellitus with ketoacidosis without coma: Principal | ICD-10-CM | POA: Diagnosis present

## 2018-05-31 DIAGNOSIS — E1110 Type 2 diabetes mellitus with ketoacidosis without coma: Secondary | ICD-10-CM | POA: Diagnosis present

## 2018-05-31 DIAGNOSIS — E1021 Type 1 diabetes mellitus with diabetic nephropathy: Secondary | ICD-10-CM | POA: Diagnosis present

## 2018-05-31 DIAGNOSIS — Z794 Long term (current) use of insulin: Secondary | ICD-10-CM

## 2018-05-31 DIAGNOSIS — A419 Sepsis, unspecified organism: Secondary | ICD-10-CM | POA: Clinically undetermined

## 2018-05-31 LAB — URINALYSIS, COMPLETE (UACMP) WITH MICROSCOPIC
Bacteria, UA: NONE SEEN
Bilirubin Urine: NEGATIVE
Glucose, UA: >=500 mg/dL — AB
Ketones, ur: 80 mg/dL — AB
Nitrite: NEGATIVE
Protein, ur: 100 mg/dL — AB
Specific Gravity, Urine: 1.015 (ref 1.005–1.030)
WBC, UA: >50 WBC/hpf — ABNORMAL HIGH (ref 0–5)
pH: 6 (ref 5.0–8.0)

## 2018-05-31 LAB — CBC WITH DIFFERENTIAL/PLATELET
Abs Immature Granulocytes: 0.07 10*3/uL (ref 0.00–0.07)
Basophils Absolute: 0.1 10*3/uL (ref 0.0–0.1)
Basophils Relative: 1 %
Eosinophils Absolute: 0.2 10*3/uL (ref 0.0–0.5)
Eosinophils Relative: 1 %
HCT: 37.8 % (ref 36.0–46.0)
Hemoglobin: 12.5 g/dL (ref 12.0–15.0)
Immature Granulocytes: 1 %
Lymphocytes Relative: 28 %
Lymphs Abs: 3.4 10*3/uL (ref 0.7–4.0)
MCH: 30 pg (ref 26.0–34.0)
MCHC: 33.1 g/dL (ref 30.0–36.0)
MCV: 90.9 fL (ref 80.0–100.0)
Monocytes Absolute: 1.4 10*3/uL — ABNORMAL HIGH (ref 0.1–1.0)
Monocytes Relative: 11 %
Neutro Abs: 7.1 10*3/uL (ref 1.7–7.7)
Neutrophils Relative %: 58 %
Platelets: 372 10*3/uL (ref 150–400)
RBC: 4.16 MIL/uL (ref 3.87–5.11)
RDW: 13.2 % (ref 11.5–15.5)
WBC: 12.1 10*3/uL — ABNORMAL HIGH (ref 4.0–10.5)
nRBC: 0 % (ref 0.0–0.2)

## 2018-05-31 LAB — POCT PREGNANCY, URINE: Preg Test, Ur: NEGATIVE

## 2018-05-31 LAB — COMPREHENSIVE METABOLIC PANEL
ALT: 10 U/L (ref 0–44)
AST: 7 U/L — ABNORMAL LOW (ref 15–41)
Albumin: 4 g/dL (ref 3.5–5.0)
Alkaline Phosphatase: 103 U/L (ref 38–126)
Anion gap: 22 — ABNORMAL HIGH (ref 5–15)
BUN: 9 mg/dL (ref 6–20)
CO2: 10 mmol/L — ABNORMAL LOW (ref 22–32)
Calcium: 9.5 mg/dL (ref 8.9–10.3)
Chloride: 100 mmol/L (ref 98–111)
Creatinine, Ser: 0.66 mg/dL (ref 0.44–1.00)
GFR calc Af Amer: >60 mL/min (ref >60)
GFR calc non Af Amer: >60 mL/min (ref >60)
Glucose, Bld: 271 mg/dL — ABNORMAL HIGH (ref 70–99)
Potassium: 3.7 mmol/L (ref 3.5–5.1)
Sodium: 132 mmol/L — ABNORMAL LOW (ref 135–145)
Total Bilirubin: 1.4 mg/dL — ABNORMAL HIGH (ref 0.3–1.2)
Total Protein: 9.1 g/dL — ABNORMAL HIGH (ref 6.5–8.1)

## 2018-05-31 LAB — BLOOD GAS, VENOUS
Acid-base deficit: 14.9 mmol/L — ABNORMAL HIGH (ref 0.0–2.0)
Bicarbonate: 10.5 mmol/L — ABNORMAL LOW (ref 20.0–28.0)
O2 Saturation: 36.2 %
Patient temperature: 37
pCO2, Ven: 24 mmHg — ABNORMAL LOW (ref 44.0–60.0)
pH, Ven: 7.25 (ref 7.250–7.430)
pO2, Ven: <31 mmHg — CL (ref 32.0–45.0)

## 2018-05-31 LAB — LIPASE, BLOOD: Lipase: 21 U/L (ref 11–51)

## 2018-05-31 LAB — GLUCOSE, CAPILLARY
Glucose-Capillary: 254 mg/dL — ABNORMAL HIGH (ref 70–99)
Glucose-Capillary: 221 mg/dL — ABNORMAL HIGH (ref 70–99)

## 2018-05-31 LAB — LACTIC ACID, PLASMA: Lactic Acid, Venous: 1.1 mmol/L (ref 0.5–1.9)

## 2018-05-31 LAB — HCG, QUANTITATIVE, PREGNANCY: hCG, Beta Chain, Quant, S: <1 m[IU]/mL (ref <5)

## 2018-05-31 MED ORDER — ONDANSETRON HCL 4 MG/2ML IJ SOLN
INTRAMUSCULAR | Status: AC
  Administered 2018-05-31: 4 mg via INTRAVENOUS
  Filled 2018-05-31: qty 2

## 2018-05-31 MED ORDER — SODIUM CHLORIDE 0.9 % IV BOLUS
1000.0000 mL | Freq: Once | INTRAVENOUS | Status: AC
  Administered 2018-05-31: 1000 mL via INTRAVENOUS

## 2018-05-31 MED ORDER — FENTANYL CITRATE (PF) 100 MCG/2ML IJ SOLN
50.0000 ug | Freq: Once | INTRAMUSCULAR | Status: AC
  Administered 2018-05-31: 21:00:00 50 ug via INTRAVENOUS
  Filled 2018-05-31: qty 2

## 2018-05-31 MED ORDER — SODIUM CHLORIDE 0.9 % IV SOLN
1.0000 g | Freq: Once | INTRAVENOUS | Status: AC
  Administered 2018-05-31: 1 g via INTRAVENOUS
  Filled 2018-05-31: qty 10

## 2018-05-31 MED ORDER — INSULIN REGULAR(HUMAN) IN NACL 100-0.9 UT/100ML-% IV SOLN
INTRAVENOUS | Status: DC
  Administered 2018-05-31: 2.1 [IU]/h via INTRAVENOUS

## 2018-05-31 MED ORDER — DEXTROSE-NACL 5-0.45 % IV SOLN: INTRAVENOUS | Status: DC

## 2018-05-31 MED ORDER — SODIUM CHLORIDE 0.9 % IV SOLN: INTRAVENOUS | Status: DC

## 2018-05-31 MED ORDER — ONDANSETRON HCL 4 MG/2ML IJ SOLN
4.0000 mg | Freq: Once | INTRAMUSCULAR | Status: AC
  Administered 2018-05-31 (×2): 4 mg via INTRAVENOUS
  Filled 2018-05-31: qty 2

## 2018-05-31 NOTE — ED Notes (Signed)
Patient crying and stating "im In DKA", when asked what makes her feel she is in DKA patient reports she knows when she starts having N/V/D. Labs sent from triage. Awaiting md eval and plan of care.

## 2018-05-31 NOTE — ED Notes (Signed)
ED TO INPATIENT HANDOFF REPORT  ED Nurse Name and Phone #: Raphael Gibney Name/Age/Gender Mandy Patel 23 y.o. female Room/Bed: ED10A/ED10A  Code Status   Code Status: Prior  Home/SNF/Other Home Patient oriented to: self, place, time and situation Is this baseline? Yes   Triage Complete: Triage complete  Chief Complaint dka  Triage Note Elevated blood sugars, abdominal pain, NVD. Pt type 1 diabetic. Increased RR. Pt tearful.   Allergies Allergies  Allergen Reactions  . Peanut Oil Other (See Comments)    Reaction to peanuts - gums feel numb and tingling  . Other Swelling    Pecans caused throat swelling and weakness    Level of Care/Admitting Diagnosis ED Disposition    ED Disposition Condition Comment   Admit  Hospital Area: [facility] [[id number]]  Level of Care: Stepdown [14]  Covid Evaluation: N/A  Diagnosis: DKA (diabetic ketoacidoses) [facility]) [[id number]]  Admitting Physician: Oralia Manis [[id number]]  Attending Physician: Anne Hahn, DAVID [phone]  Estimated length of stay: past midnight tomorrow  Certification:: I certify this patient will need inpatient services for at least 2 midnights  PT Class (Do Not Modify): Inpatient [101]  PT Acc Code (Do Not Modify): Private [1]       B Medical/Surgery History Past Medical History:  Diagnosis Date  . Diabetes mellitus    type I, diagnosed age 33yo  . Pyelonephritis 03/2017   Past Surgical History:  Procedure Laterality Date  . EYE MUSCLE SURGERY  approx 2010   bilat eye surgury, left exotropia worse;Dr Maple Hudson, opthomology     A IV Location/Drains/Wounds Patient Lines/Drains/Airways Status   Active Line/Drains/Airways    Name:   Placement date:   Placement time:   Site:   Days:   Peripheral IV 05/31/18 Right Forearm   05/31/18    2007    Forearm   less than 1          Intake/Output Last 24 hours  Intake/Output Summary (Last 24 hours) at 05/31/2018 2347 Last data filed at 05/31/2018  2232 Gross per 24 hour  Intake 1100 ml  Output -  Net 1100 ml    Labs/Imaging Results for orders placed or performed during the hospital encounter of 05/31/18 (from the past 48 hour(s))  Glucose, capillary     Status: Abnormal   Collection Time: 05/31/18  8:03 PM  Result Value Ref Range   Glucose-Capillary 254 (H) 70 - 99 mg/dL  CBC with Differential/Platelet     Status: Abnormal   Collection Time: 05/31/18  8:17 PM  Result Value Ref Range   WBC 12.1 (H) 4.0 - 10.5 K/uL   RBC 4.16 3.87 - 5.11 MIL/uL   Hemoglobin 12.5 12.0 - 15.0 g/dL   HCT 57.8 46.9 - 62.9 %   MCV 90.9 80.0 - 100.0 fL   MCH 30.0 26.0 - 34.0 pg   MCHC 33.1 30.0 - 36.0 g/dL   RDW 52.8 41.3 - 24.4 %   Platelets 372 150 - 400 K/uL   nRBC 0.0 0.0 - 0.2 %   Neutrophils Relative % 58 %   Neutro Abs 7.1 1.7 - 7.7 K/uL   Lymphocytes Relative 28 %   Lymphs Abs 3.4 0.7 - 4.0 K/uL   Monocytes Relative 11 %   Monocytes Absolute 1.4 (H) 0.1 - 1.0 K/uL   Eosinophils Relative 1 %   Eosinophils Absolute 0.2 0.0 - 0.5 K/uL   Basophils Relative 1 %   Basophils Absolute 0.1 0.0 - 0.1 K/uL  Immature Granulocytes 1 %   Abs Immature Granulocytes 0.07 0.00 - 0.07 K/uL    Comment: Performed at [facility], [address] [place], [place] [address]  Comprehensive metabolic panel     Status: Abnormal   Collection Time: 05/31/18  8:17 PM  Result Value Ref Range   Sodium 132 (L) 135 - 145 mmol/L    Comment: LYTES REPEATED TO CONFIRM SMA   Potassium 3.7 3.5 - 5.1 mmol/L   Chloride 100 98 - 111 mmol/L   CO2 10 (L) 22 - 32 mmol/L   Glucose, Bld 271 (H) 70 - 99 mg/dL   BUN 9 6 - 20 mg/dL   Creatinine, Ser 6.04 0.44 - 1.00 mg/dL   Calcium 9.5 8.9 - 54.0 mg/dL   Total Protein 9.1 (H) 6.5 - 8.1 g/dL   Albumin 4.0 3.5 - 5.0 g/dL   AST 7 (L) 15 - 41 U/L   ALT 10 0 - 44 U/L   Alkaline Phosphatase 103 38 - 126 U/L   Total Bilirubin 1.4 (H) 0.3 - 1.2 mg/dL   GFR calc non Af Amer >60 >60 mL/min   GFR calc Af Amer >60 >60  mL/min   Anion gap 22 (H) 5 - 15    Comment: Performed at [facility], [address] [place], [place] [address]  Lipase, blood     Status: None   Collection Time: 05/31/18  8:17 PM  Result Value Ref Range   Lipase 21 11 - 51 U/L    Comment: Performed at [facility], [address] [place], [place] [address]  hCG, quantitative, pregnancy     Status: None   Collection Time: 05/31/18  8:17 PM  Result Value Ref Range   hCG, Beta Chain, Quant, S <1 <5 mIU/mL    Comment:          GEST. AGE      CONC.  (mIU/mL)   <=1 WEEK        5 - 50     2 WEEKS       50 - 500     3 WEEKS       100 - 10,000     4 WEEKS     1,000 - 30,000     5 WEEKS     3,500 - 115,000   6-8 WEEKS     12,000 - 270,000    12 WEEKS     15,000 - 220,000        FEMALE AND NON-PREGNANT FEMALE:     LESS THAN 5 mIU/mL Performed at [facility], [address] [place], [place] [address]   Blood gas, venous     Status: Abnormal   Collection Time: 05/31/18  8:25 PM  Result Value Ref Range   pH, Ven 7.25 7.250 - 7.430   pCO2, Ven 24 (L) 44.0 - 60.0 mmHg   pO2, Ven <31.0 (LL) 32.0 - 45.0 mmHg   Bicarbonate 10.5 (L) 20.0 - 28.0 mmol/L   Acid-base deficit 14.9 (H) 0.0 - 2.0 mmol/L   O2 Saturation 36.2 %   Patient temperature 37.0    Collection site VENOUS    Sample type VENOUS     Comment: Performed at [facility], [address] [place], [place] [address]  Urinalysis, Complete w Microscopic     Status: Abnormal   Collection Time: 05/31/18  8:25 PM  Result Value Ref Range   Color, Urine YELLOW (A) YELLOW  APPearance CLOUDY (A) CLEAR   Specific Gravity, Urine 1.015 1.005 - 1.030   pH 6.0 5.0 - 8.0   Glucose, UA >=500 (A) NEGATIVE mg/dL   Hgb urine dipstick MODERATE (A) NEGATIVE   Bilirubin Urine NEGATIVE NEGATIVE   Ketones, ur 80 (A) NEGATIVE mg/dL   Protein, ur 161100 (A) NEGATIVE mg/dL   Nitrite NEGATIVE NEGATIVE   Leukocytes,Ua MODERATE (A) NEGATIVE   RBC / HPF 0-5 0 - 5  RBC/hpf   WBC, UA >50 (H) 0 - 5 WBC/hpf   Bacteria, UA NONE SEEN NONE SEEN   Squamous Epithelial / LPF 0-5 0 - 5   WBC Clumps PRESENT    Mucus PRESENT    Hyaline Casts, UA PRESENT     Comment: Performed at [facility], [address] [place], [place] [address]  Pregnancy, urine POC     Status: None   Collection Time: 05/31/18  8:28 PM  Result Value Ref Range   Preg Test, Ur NEGATIVE NEGATIVE    Comment:        THE SENSITIVITY OF THIS METHODOLOGY IS >24 mIU/mL   Lactic acid, plasma     Status: None   Collection Time: 05/31/18  9:21 PM  Result Value Ref Range   Lactic Acid, Venous 1.1 0.5 - 1.9 mmol/L    Comment: Performed at [facility], [address] [place], [place] [address]  Glucose, capillary     Status: Abnormal   Collection Time: 05/31/18 10:34 PM  Result Value Ref Range   Glucose-Capillary 221 (H) 70 - 99 mg/dL   Ct Renal Stone Study  Result Date: 05/31/2018 CLINICAL DATA:  Type 1 diabetic. Abdominal pain. Assess for renal stone disease. EXAM: CT ABDOMEN AND PELVIS WITHOUT CONTRAST TECHNIQUE: Multidetector CT imaging of the abdomen and pelvis was performed following the standard protocol without IV contrast. COMPARISON:  03/25/2018 FINDINGS: Lower chest: Normal Hepatobiliary: Normal without contrast. Pancreas: Normal Spleen: Normal Adrenals/Urinary Tract: Adrenal glands are normal. The kidneys are enlarged and show increased prominence of the renal collecting systems. Findings could go along with acute nephritis, diabetic glomerular nephropathy or low-grade obstruction. There is no evidence of stone disease. Bladder appears normal. No stone in the bladder. Stomach/Bowel: Normal Vascular/Lymphatic: Normal Reproductive: Normal.  No pelvic mass. Other: Tiny amount of free fluid in the pelvic cul de sac, often seen in females of this age. Musculoskeletal: Negative IMPRESSION: Large kidneys with mild fullness of the renal collecting systems and ureters but no  evidence of stone disease or obstructing lesion. Renal enlargement can be seen with diabetic glomerulonephropathy. However, the presence of the renal collecting system fullness raises possibility of urinary tract infection and acute nephritis. Electronically Signed   By: Paulina FusiMark  Shogry M.D.   On: 05/31/2018 21:58    Pending Labs Unresulted Labs (From admission, onward)    Start     Ordered   05/31/18 2103  Blood culture (routine x 2)  BLOOD CULTURE X 2,   STAT     05/31/18 2103   05/31/18 2042  Urine Culture  Add-on,   AD     05/31/18 2041   Signed and Held  HIV antibody (Routine Testing)  Once,   R     Signed and Held   Signed and Held  Basic metabolic panel  STAT Now then every 4 hours ,   STAT     Signed and Held   Signed and Held  CBC  (enoxaparin (LOVENOX)    CrCl >/=  30 ml/min)  Once,   R    Comments:  Baseline for enoxaparin therapy IF NOT ALREADY DRAWN.  Notify MD if PLT < 100 K.    Signed and Held   Signed and Held  Creatinine, serum  (enoxaparin (LOVENOX)    CrCl >/= 30 ml/min)  Once,   R    Comments:  Baseline for enoxaparin therapy IF NOT ALREADY DRAWN.    Signed and Held   Signed and Held  Creatinine, serum  (enoxaparin (LOVENOX)    CrCl >/= 30 ml/min)  Weekly,   R    Comments:  while on enoxaparin therapy    Signed and Held          Vitals/Pain Today's Vitals   05/31/18 2134 05/31/18 2240 05/31/18 2300 05/31/18 2330  BP:   137/87 135/86  Pulse:      Resp:      Temp:      TempSrc:      SpO2:      Weight:      Height:      PainSc: 4  0-No pain      Isolation Precautions No active isolations  Medications Medications  dextrose 5 %-0.45 % sodium chloride infusion (has no administration in time range)  insulin regular, human (MYXREDLIN) 100 units/ 100 mL infusion (1.6 Units/hr Intravenous Rate/Dose Change 05/31/18 2251)  sodium chloride 0.9 % bolus 1,000 mL (1,000 mLs Intravenous New Bag/Given 05/31/18 2237)    And  0.9 %  sodium chloride infusion (has no  administration in time range)  sodium chloride 0.9 % bolus 1,000 mL (0 mLs Intravenous Stopped 05/31/18 2232)  ondansetron (ZOFRAN) injection 4 mg (4 mg Intravenous Given 05/31/18 2307)  fentaNYL (SUBLIMAZE) injection 50 mcg (50 mcg Intravenous Given 05/31/18 2058)  cefTRIAXone (ROCEPHIN) 1 g in sodium chloride 0.9 % 100 mL IVPB (0 g Intravenous Stopped 05/31/18 2231)    Mobility walks Low fall risk   Focused Assessments    R Recommendations: See Admitting Provider Note  Report given to:   Additional Notes: none

## 2018-05-31 NOTE — ED Provider Notes (Signed)
[facility] Emergency Department Provider Note  ____________________________________________  Time seen: Approximately 8:38 PM  I have reviewed the triage vital signs and the nursing notes.   HISTORY  Chief Complaint Abdominal Pain; Emesis; and Hyperglycemia   HPI Mandy Patel is a 23 y.o. female with a history of type 1 diabetes who presents for evaluation of nausea, vomiting and diarrhea.  Patient reports several daily episodes of nonbloody nonbilious emesis for 2 days.  Vomiting stopped yesterday and today she started having diarrhea.  She reports 4 episodes of watery diarrhea, no melena, no coffee-ground emesis or hematemesis.  She is complaining of left-sided crampy abdominal pain for 3 days.  No fever or chills, no cough, no chest pain or shortness of breath.  Patient is concerned that she is in DKA today.  She denies dysuria or hematuria.  LMP was a week ago.  No prior abdominal surgeries.  No known exposures to COVID-19.  No history of C. difficile or recent antibiotic use.  Past Medical History:  Diagnosis Date   Diabetes mellitus    type I, diagnosed age 86yo   Pyelonephritis 03/2017    Patient Active Problem List   Diagnosis Date Noted   Sepsis ([place]) 09/04/2017   Type 1 diabetes mellitus with hyperglycemia ([place]) 03/15/2017   Acute pyelonephritis 03/15/2017   Acute right flank pain 03/15/2017   Influenza A 03/06/2017   Vomiting 03/06/2017   DKA (diabetic ketoacidoses) ([place]) 06/12/2015   Type I (juvenile type) diabetes mellitus without mention of complication, uncontrolled 04/26/2011   Preventative health care 12/01/2010   Obesity 06/19/2010   Goiter, unspecified 06/19/2010   HYPERCHOLESTEROLEMIA 04/25/2008    Past Surgical History:  Procedure Laterality Date   EYE MUSCLE SURGERY  approx 2010   bilat eye surgury, left exotropia worse;Dr Annamaria Boots, opthomology    Prior to Admission medications   Medication Sig Start Date  End Date Taking? Authorizing Provider  etonogestrel (NEXPLANON) 68 MG IMPL implant 1 each by Subdermal route once. Implanted Sept 2016   Yes [provider]  Insulin Detemir (LEVEMIR FLEXTOUCH) 100 UNIT/ML Pen Inject 30 Units into the skin at bedtime. 09/05/17  Yes Mody, Sital, MD  insulin lispro (HUMALOG) 100 UNIT/ML injection Inject 0.04 mLs (4 Units total) into the skin 3 (three) times daily with meals. 09/05/17  Yes Bettey Costa, MD  blood glucose meter kit and supplies KIT Dispense based on patient and insurance preference. Use up to four times daily as directed. (FOR ICD-9 250.00, 250.01). 09/05/17   Bettey Costa, MD  cephALEXin (KEFLEX) 500 MG capsule Take 1 capsule (500 mg total) by mouth 3 (three) times daily. Patient not taking: Reported on 05/31/2018 03/25/18   Paulette Blanch, MD  insulin lispro (HUMALOG) 100 UNIT/ML injection Take per sliding scale Patient not taking: Reported on 05/31/2018 09/05/17 09/05/18  Bettey Costa, MD    Allergies Peanut oil and Other  Family History  Problem Relation Age of Onset   Alcohol abuse Other    Arthritis Other    Hyperlipidemia Other    Stroke Other    Diabetes Other    Hyperlipidemia Other    Hypertension Other    Diabetes Other     Social History Social History   Tobacco Use   Smoking status: Never Smoker   Smokeless tobacco: Never Used  Substance Use Topics   Alcohol use: No   Drug use: Yes    Types: Marijuana    Review of Systems  Constitutional: Negative  for fever. Eyes: Negative for visual changes. ENT: Negative for sore throat. Neck: No neck pain  Cardiovascular: Negative for chest pain. Respiratory: Negative for shortness of breath. Gastrointestinal: + abdominal pain, vomiting and diarrhea. Genitourinary: Negative for dysuria. Musculoskeletal: Negative for back pain. Skin: Negative for rash. Neurological: Negative for headaches, weakness or numbness. Psych: No SI or  HI  ____________________________________________   PHYSICAL EXAM:  VITAL SIGNS: ED Triage Vitals [05/31/18 2002]  Enc Vitals Group     BP (!) 160/88     Pulse Rate (!) 137     Resp 18     Temp 98.1 F (36.7 C)     Temp Source Oral     SpO2 100 %     Weight 167 lb (75.8 kg)     Height _0  (1.727 m)     Head Circumference      Peak Flow      Pain Score 10     Pain Loc      Pain Edu?      Excl. in [place]?     Constitutional: Alert and oriented. Well appearing and in no apparent distress. HEENT:      Head: Normocephalic and atraumatic.         Eyes: Conjunctivae are normal. Sclera is non-icteric.       Mouth/Throat: Mucous membranes are dry.       Neck: Supple with no signs of meningismus. Cardiovascular: Tachycardic with regular rhythm. No murmurs, gallops, or rubs. 2+ symmetrical distal pulses are present in all extremities. No JVD. Respiratory: Normal respiratory effort, tachypneic. Lungs are clear to auscultation bilaterally. No wheezes, crackles, or rhonchi.  Gastrointestinal: Soft, non tender, and non distended with positive bowel sounds. No rebound or guarding. Genitourinary: No CVA tenderness. Musculoskeletal: Nontender with normal range of motion in all extremities. No edema, cyanosis, or erythema of extremities. Neurologic: Normal speech and language. Face is symmetric. Moving all extremities. No gross focal neurologic deficits are appreciated. Skin: Skin is warm, dry and intact. No rash noted. Psychiatric: Mood and affect are normal. Speech and behavior are normal.  ____________________________________________   LABS (all labs ordered are listed, but only abnormal results are displayed)  Labs Reviewed  GLUCOSE, CAPILLARY - Abnormal; Notable for the following components:      Result Value   Glucose-Capillary 254 (*)    All other components within normal limits  CBC WITH DIFFERENTIAL/PLATELET - Abnormal; Notable for the following components:   WBC 12.1 (*)     Monocytes Absolute 1.4 (*)    All other components within normal limits  COMPREHENSIVE METABOLIC PANEL - Abnormal; Notable for the following components:   Sodium 132 (*)    CO2 10 (*)    Glucose, Bld 271 (*)    Total Protein 9.1 (*)    AST 7 (*)    Total Bilirubin 1.4 (*)    Anion gap 22 (*)    All other components within normal limits  BLOOD GAS, VENOUS - Abnormal; Notable for the following components:   pCO2, Ven 24 (*)    pO2, Ven <31.0 (*)    Bicarbonate 10.5 (*)    Acid-base deficit 14.9 (*)    All other components within normal limits  URINALYSIS, COMPLETE (UACMP) WITH MICROSCOPIC - Abnormal; Notable for the following components:   Color, Urine YELLOW (*)    APPearance CLOUDY (*)    Glucose, UA >=500 (*)    Hgb urine dipstick MODERATE (*)    Ketones, ur 80 (*)  Protein, ur 100 (*)    Leukocytes,Ua MODERATE (*)    WBC, UA >50 (*)    All other components within normal limits  URINE CULTURE  CULTURE, BLOOD (ROUTINE X 2)  CULTURE, BLOOD (ROUTINE X 2)  LIPASE, BLOOD  HCG, QUANTITATIVE, PREGNANCY  LACTIC ACID, PLASMA  POCT PREGNANCY, URINE  POC URINE PREG, ED   ____________________________________________  EKG  none  ____________________________________________  RADIOLOGY  I have personally reviewed the images performed during this visit and I agree with the Radiologist's read.   Interpretation by Radiologist:  Ct Renal Stone Study  Result Date: 05/31/2018 CLINICAL DATA:  Type 1 diabetic. Abdominal pain. Assess for renal stone disease. EXAM: CT ABDOMEN AND PELVIS WITHOUT CONTRAST TECHNIQUE: Multidetector CT imaging of the abdomen and pelvis was performed following the standard protocol without IV contrast. COMPARISON:  03/25/2018 FINDINGS: Lower chest: Normal Hepatobiliary: Normal without contrast. Pancreas: Normal Spleen: Normal Adrenals/Urinary Tract: Adrenal glands are normal. The kidneys are enlarged and show increased prominence of the renal collecting  systems. Findings could go along with acute nephritis, diabetic glomerular nephropathy or low-grade obstruction. There is no evidence of stone disease. Bladder appears normal. No stone in the bladder. Stomach/Bowel: Normal Vascular/Lymphatic: Normal Reproductive: Normal.  No pelvic mass. Other: Tiny amount of free fluid in the pelvic cul de sac, often seen in females of this age. Musculoskeletal: Negative IMPRESSION: Large kidneys with mild fullness of the renal collecting systems and ureters but no evidence of stone disease or obstructing lesion. Renal enlargement can be seen with diabetic glomerulonephropathy. However, the presence of the renal collecting system fullness raises possibility of urinary tract infection and acute nephritis. Electronically Signed   By: Nelson Chimes M.D.   On: 05/31/2018 21:58     ____________________________________________   PROCEDURES  Procedure(s) performed: None Procedures Critical Care performed: yes  CRITICAL CARE Performed by: Rudene Re  ?  Total critical care time: 40 min  Critical care time was exclusive of separately billable procedures and treating other patients.  Critical care was necessary to treat or prevent imminent or life-threatening deterioration.  Critical care was time spent personally by me on the following activities: development of treatment plan with patient and/or surrogate as well as nursing, discussions with consultants, evaluation of patient's response to treatment, examination of patient, obtaining history from patient or surrogate, ordering and performing treatments and interventions, ordering and review of laboratory studies, ordering and review of radiographic studies, pulse oximetry and re-evaluation of patient's condition.  ____________________________________________   INITIAL IMPRESSION / ASSESSMENT AND PLAN / ED COURSE   23 y.o. female with a history of type 1 diabetes who presents for evaluation of nausea,  vomiting and diarrhea x 3 days and concerns for DKA.  Patient is tachypneic and tachycardic, looks dry on exam, abdomen is soft with left-sided tenderness.  Initial blood glucose of 254.  Labs including VBG pending to rule out DKA.  Abdominal pain with vomiting and diarrhea most likely due to gastroenteritis versus colitis.  Patient has had pyelonephritis in the past, will check urine.  hCG and labs are pending.  Will give fentanyl, Zofran and fluids.    _________________________ 10:02 PM on 05/31/2018 -----------------------------------------  Labs consistent with DKA.  UA positive for urinary tract infection.  CT was done to rule out obstructing stone.  CT negative for obstructing stone burden positive for pyelonephritis.  No signs of sepsis at this time.  Patient was given Rocephin, IV fluids, and started on insulin drip.  Will admit  to the hospitalist service peer   As part of my medical decision making, I reviewed the following data within the [place] notes reviewed and incorporated, Labs reviewed , Old chart reviewed, Radiograph reviewed , Discussed with admitting physician , Notes from prior ED visits and [place] Controlled Substance Database    Pertinent labs & imaging results that were available during my care of the patient were reviewed by me and considered in my medical decision making (see chart for details).    ____________________________________________   FINAL CLINICAL IMPRESSION(S) / ED DIAGNOSES  Final diagnoses:  Diabetic ketoacidosis without coma associated with type 1 diabetes mellitus (HCC)  Nausea vomiting and diarrhea  Pyelonephritis      NEW MEDICATIONS STARTED DURING THIS VISIT:  ED Discharge Orders    None       Note:  This document was prepared using Dragon voice recognition software and may include unintentional dictation errors.    Alfred Levins, [place], MD 05/31/18 2204

## 2018-05-31 NOTE — ED Notes (Signed)
Off unit to CT

## 2018-05-31 NOTE — H&P (Signed)
[facility] [place] NAME: Mandy Patel    MR#:  [id number]  DATE OF BIRTH:  [birth year]  DATE OF ADMISSION:  05/31/2018  PRIMARY CARE PHYSICIAN: Patient, No Pcp Per   REQUESTING/REFERRING PHYSICIAN: Alfred Levins, MD  CHIEF COMPLAINT:   Chief Complaint  Patient presents with  . Abdominal Pain  . Emesis  . Hyperglycemia    HISTORY OF PRESENT ILLNESS:  Mandy Patel  is a 23 y.o. female who presents with chief complaint as above.  Patient presents to the ED with a complaint of 4 days of nausea and vomiting.  She states that initially she thought she had just eaten something that given her food poisoning, but that her nausea vomiting was persistent and worsening.  Here in the ED today she is found to be in DKA.  She is also found to have pyelonephritis.  Hospitalist were called for admission  PAST MEDICAL HISTORY:   Past Medical History:  Diagnosis Date  . Diabetes mellitus    type I, diagnosed age 65yo  . Pyelonephritis 03/2017     PAST SURGICAL HISTORY:   Past Surgical History:  Procedure Laterality Date  . EYE MUSCLE SURGERY  approx 2010   bilat eye surgury, left exotropia worse;Dr Annamaria Boots, opthomology     SOCIAL HISTORY:   Social History   Tobacco Use  . Smoking status: Never Smoker  . Smokeless tobacco: Never Used  Substance Use Topics  . Alcohol use: No     FAMILY HISTORY:   Family History  Problem Relation Age of Onset  . Alcohol abuse Other   . Arthritis Other   . Hyperlipidemia Other   . Stroke Other   . Diabetes Other   . Hyperlipidemia Other   . Hypertension Other   . Diabetes Other      DRUG ALLERGIES:   Allergies  Allergen Reactions  . Peanut Oil Other (See Comments)    Reaction to peanuts - gums feel numb and tingling  . Other Swelling    Pecans caused throat swelling and weakness    MEDICATIONS AT HOME:   Prior to Admission medications   Medication Sig Start Date End Date  Taking? Authorizing Provider  etonogestrel (NEXPLANON) 68 MG IMPL implant 1 each by Subdermal route once. Implanted Sept 2016   Yes [provider]  Insulin Detemir (LEVEMIR FLEXTOUCH) 100 UNIT/ML Pen Inject 30 Units into the skin at bedtime. 09/05/17  Yes Mody, Sital, MD  insulin lispro (HUMALOG) 100 UNIT/ML injection Inject 0.04 mLs (4 Units total) into the skin 3 (three) times daily with meals. 09/05/17  Yes Bettey Costa, MD  blood glucose meter kit and supplies KIT Dispense based on patient and insurance preference. Use up to four times daily as directed. (FOR ICD-9 250.00, 250.01). 09/05/17   Bettey Costa, MD  cephALEXin (KEFLEX) 500 MG capsule Take 1 capsule (500 mg total) by mouth 3 (three) times daily. Patient not taking: Reported on 05/31/2018 03/25/18   Paulette Blanch, MD  insulin lispro (HUMALOG) 100 UNIT/ML injection Take per sliding scale Patient not taking: Reported on 05/31/2018 09/05/17 09/05/18  Bettey Costa, MD    REVIEW OF SYSTEMS:  Review of Systems  Constitutional: Positive for malaise/fatigue. Negative for chills, fever and weight loss.  HENT: Negative for ear pain, hearing loss and tinnitus.   Eyes: Negative for blurred vision, double vision, pain and redness.  Respiratory: Negative for cough, hemoptysis and shortness of breath.   Cardiovascular: Negative for  chest pain, palpitations, orthopnea and leg swelling.  Gastrointestinal: Positive for nausea and vomiting. Negative for abdominal pain, constipation and diarrhea.  Genitourinary: Negative for dysuria, frequency and hematuria.  Musculoskeletal: Negative for back pain, joint pain and neck pain.  Skin:       No acne, rash, or lesions  Neurological: Negative for dizziness, tremors, focal weakness and weakness.  Endo/Heme/Allergies: Negative for polydipsia. Does not bruise/bleed easily.  Psychiatric/Behavioral: Negative for depression. The patient is not nervous/anxious and does not have insomnia.      VITAL SIGNS:    Vitals:   05/31/18 2002 05/31/18 2101  BP: (!) 160/88 (!) 178/101  Pulse: (!) 137 (!) 102  Resp: 18 20  Temp: 98.1 F (36.7 C)   TempSrc: Oral   SpO2: 100% 100%  Weight: 75.8 kg   Height: 5' 8" (1.727 m)    Wt Readings from Last 3 Encounters:  05/31/18 75.8 kg  03/24/18 73.9 kg  09/04/17 71.5 kg    PHYSICAL EXAMINATION:  Physical Exam  Vitals reviewed. Constitutional: She is oriented to person, place, and time. She appears well-developed and well-nourished. No distress.  HENT:  Head: Normocephalic and atraumatic.  Dry mucous membranes  Eyes: Pupils are equal, round, and reactive to light. Conjunctivae and EOM are normal. No scleral icterus.  Neck: Normal range of motion. Neck supple. No JVD present. No thyromegaly present.  Cardiovascular: Normal rate, regular rhythm and intact distal pulses. Exam reveals no gallop and no friction rub.  No murmur heard. Respiratory: Effort normal and breath sounds normal. No respiratory distress. She has no wheezes. She has no rales.  GI: Soft. Bowel sounds are normal. She exhibits no distension. There is no abdominal tenderness.  Musculoskeletal: Normal range of motion.        General: No edema.     Comments: No arthritis, no gout  Lymphadenopathy:    She has no cervical adenopathy.  Neurological: She is alert and oriented to person, place, and time. No cranial nerve deficit.  No dysarthria, no aphasia  Skin: Skin is warm and dry. No rash noted. No erythema.  Psychiatric: She has a normal mood and affect. Her behavior is normal. Judgment and thought content normal.    LABORATORY PANEL:   CBC Recent Labs  Lab 05/31/18 2017  WBC 12.1*  HGB 12.5  HCT 37.8  PLT 372   ------------------------------------------------------------------------------------------------------------------  Chemistries  Recent Labs  Lab 05/31/18 2017  NA 132*  K 3.7  CL 100  CO2 10*  GLUCOSE 271*  BUN 9  CREATININE 0.66  CALCIUM 9.5  AST 7*   ALT 10  ALKPHOS 103  BILITOT 1.4*   ------------------------------------------------------------------------------------------------------------------  Cardiac Enzymes No results for input(s): TROPONINI in the last 168 hours. ------------------------------------------------------------------------------------------------------------------  RADIOLOGY:  Ct Renal Stone Study  Result Date: 05/31/2018 CLINICAL DATA:  Type 1 diabetic. Abdominal pain. Assess for renal stone disease. EXAM: CT ABDOMEN AND PELVIS WITHOUT CONTRAST TECHNIQUE: Multidetector CT imaging of the abdomen and pelvis was performed following the standard protocol without IV contrast. COMPARISON:  03/25/2018 FINDINGS: Lower chest: Normal Hepatobiliary: Normal without contrast. Pancreas: Normal Spleen: Normal Adrenals/Urinary Tract: Adrenal glands are normal. The kidneys are enlarged and show increased prominence of the renal collecting systems. Findings could go along with acute nephritis, diabetic glomerular nephropathy or low-grade obstruction. There is no evidence of stone disease. Bladder appears normal. No stone in the bladder. Stomach/Bowel: Normal Vascular/Lymphatic: Normal Reproductive: Normal.  No pelvic mass. Other: Tiny amount of free fluid in the  pelvic cul de sac, often seen in females of this age. Musculoskeletal: Negative IMPRESSION: Large kidneys with mild fullness of the renal collecting systems and ureters but no evidence of stone disease or obstructing lesion. Renal enlargement can be seen with diabetic glomerulonephropathy. However, the presence of the renal collecting system fullness raises possibility of urinary tract infection and acute nephritis. Electronically Signed   By: Nelson Chimes M.D.   On: 05/31/2018 21:58    EKG:   Orders placed or performed during the hospital encounter of 03/06/17  . ED EKG  . ED EKG  . EKG 12-Lead  . EKG 12-Lead    IMPRESSION AND PLAN:  Principal Problem:   DKA (diabetic  ketoacidoses) ([place]) -admit to stepdown per DKA admission order set with insulin drip, IV fluids, lab checks, and intensivist consult Active Problems:   Acute pyelonephritis -IV Rocephin, urine culture sent   HYPERCHOLESTEROLEMIA -continue home meds  Chart review performed and case discussed with ED provider. Labs, imaging and/or ECG reviewed by provider and discussed with patient/family. Management plans discussed with the patient and/or family.  DVT PROPHYLAXIS: SubQ lovenox   GI PROPHYLAXIS:  None  ADMISSION STATUS: Inpatient     CODE STATUS: Full Code Status History    Date Active Date Inactive Code Status Order ID Comments User Context   09/04/2017 1432 09/05/2017 1609 Full Code [id number]  Gorden Harms, MD ED   03/15/2017 0533 03/17/2017 1835 Full Code [id number]  Rise Patience, MD ED   03/06/2017 1014 03/07/2017 1726 Full Code [id number]  Vashti Hey, MD ED   06/12/2015 1707 06/16/2015 2025 Full Code [id number]  Nicholes Mango, MD Inpatient      TOTAL TIME TAKING CARE OF THIS PATIENT: 45 minutes.   Ethlyn Daniels 05/31/2018, 11:32 PM  [facility]  Office  [phone]  CC: Primary care physician; Patient, No Pcp Per  Note:  This document was prepared using Dragon voice recognition software and may include unintentional dictation errors.

## 2018-05-31 NOTE — ED Triage Notes (Signed)
Elevated blood sugars, abdominal pain, NVD. Pt type 1 diabetic. Increased RR. Pt tearful.

## 2018-06-01 LAB — BASIC METABOLIC PANEL
Anion gap: 15 (ref 5–15)
Anion gap: 10 (ref 5–15)
Anion gap: 9 (ref 5–15)
BUN: 8 mg/dL (ref 6–20)
BUN: 7 mg/dL (ref 6–20)
BUN: 5 mg/dL — ABNORMAL LOW (ref 6–20)
CO2: 11 mmol/L — ABNORMAL LOW (ref 22–32)
CO2: 12 mmol/L — ABNORMAL LOW (ref 22–32)
CO2: 13 mmol/L — ABNORMAL LOW (ref 22–32)
Calcium: 8.6 mg/dL — ABNORMAL LOW (ref 8.9–10.3)
Calcium: 8.7 mg/dL — ABNORMAL LOW (ref 8.9–10.3)
Calcium: 8.7 mg/dL — ABNORMAL LOW (ref 8.9–10.3)
Chloride: 110 mmol/L (ref 98–111)
Chloride: 113 mmol/L — ABNORMAL HIGH (ref 98–111)
Chloride: 112 mmol/L — ABNORMAL HIGH (ref 98–111)
Creatinine, Ser: 0.59 mg/dL (ref 0.44–1.00)
Creatinine, Ser: 0.54 mg/dL (ref 0.44–1.00)
Creatinine, Ser: 0.49 mg/dL (ref 0.44–1.00)
GFR calc Af Amer: >60 mL/min (ref >60)
GFR calc Af Amer: >60 mL/min (ref >60)
GFR calc Af Amer: >60 mL/min (ref >60)
GFR calc non Af Amer: >60 mL/min (ref >60)
GFR calc non Af Amer: >60 mL/min (ref >60)
GFR calc non Af Amer: >60 mL/min (ref >60)
Glucose, Bld: 222 mg/dL — ABNORMAL HIGH (ref 70–99)
Glucose, Bld: 195 mg/dL — ABNORMAL HIGH (ref 70–99)
Glucose, Bld: 159 mg/dL — ABNORMAL HIGH (ref 70–99)
Potassium: 4 mmol/L (ref 3.5–5.1)
Potassium: 3.8 mmol/L (ref 3.5–5.1)
Potassium: 3.8 mmol/L (ref 3.5–5.1)
Sodium: 136 mmol/L (ref 135–145)
Sodium: 135 mmol/L (ref 135–145)
Sodium: 134 mmol/L — ABNORMAL LOW (ref 135–145)

## 2018-06-01 LAB — URINE DRUG SCREEN, QUALITATIVE (ARMC ONLY)
Amphetamines, Ur Screen: NOT DETECTED
Barbiturates, Ur Screen: NOT DETECTED
Benzodiazepine, Ur Scrn: NOT DETECTED
Cannabinoid 50 Ng, Ur ~~LOC~~: POSITIVE — AB
Cocaine Metabolite,Ur ~~LOC~~: NOT DETECTED
MDMA (Ecstasy)Ur Screen: NOT DETECTED
Methadone Scn, Ur: NOT DETECTED
Opiate, Ur Screen: NOT DETECTED
Phencyclidine (PCP) Ur S: NOT DETECTED
Tricyclic, Ur Screen: NOT DETECTED

## 2018-06-01 LAB — CBC
HCT: 35.4 % — ABNORMAL LOW (ref 36.0–46.0)
Hemoglobin: 11.3 g/dL — ABNORMAL LOW (ref 12.0–15.0)
MCH: 30 pg (ref 26.0–34.0)
MCHC: 31.9 g/dL (ref 30.0–36.0)
MCV: 93.9 fL (ref 80.0–100.0)
Platelets: 331 10*3/uL (ref 150–400)
RBC: 3.77 MIL/uL — ABNORMAL LOW (ref 3.87–5.11)
RDW: 13.1 % (ref 11.5–15.5)
WBC: 11.5 10*3/uL — ABNORMAL HIGH (ref 4.0–10.5)
nRBC: 0 % (ref 0.0–0.2)

## 2018-06-01 LAB — GLUCOSE, CAPILLARY
Glucose-Capillary: 211 mg/dL — ABNORMAL HIGH (ref 70–99)
Glucose-Capillary: 213 mg/dL — ABNORMAL HIGH (ref 70–99)
Glucose-Capillary: 178 mg/dL — ABNORMAL HIGH (ref 70–99)
Glucose-Capillary: 173 mg/dL — ABNORMAL HIGH (ref 70–99)
Glucose-Capillary: 168 mg/dL — ABNORMAL HIGH (ref 70–99)
Glucose-Capillary: 166 mg/dL — ABNORMAL HIGH (ref 70–99)
Glucose-Capillary: 201 mg/dL — ABNORMAL HIGH (ref 70–99)
Glucose-Capillary: 206 mg/dL — ABNORMAL HIGH (ref 70–99)
Glucose-Capillary: 228 mg/dL — ABNORMAL HIGH (ref 70–99)

## 2018-06-01 LAB — HEMOGLOBIN A1C
Hgb A1c MFr Bld: 13 % — ABNORMAL HIGH (ref 4.8–5.6)
Mean Plasma Glucose: 326.4 mg/dL

## 2018-06-01 LAB — MRSA PCR SCREENING: MRSA by PCR: NEGATIVE

## 2018-06-01 LAB — BETA-HYDROXYBUTYRIC ACID: Beta-Hydroxybutyric Acid: 1.34 mmol/L — ABNORMAL HIGH (ref 0.05–0.27)

## 2018-06-01 MED ORDER — POTASSIUM CHLORIDE 10 MEQ/100ML IV SOLN
10.0000 meq | INTRAVENOUS | Status: AC
  Administered 2018-06-01 (×2): 10 meq via INTRAVENOUS
  Filled 2018-06-01 (×2): qty 100

## 2018-06-01 MED ORDER — POTASSIUM CHLORIDE 10 MEQ/100ML IV SOLN
10.0000 meq | INTRAVENOUS | Status: AC
  Administered 2018-06-01 (×2): 10 meq via INTRAVENOUS
  Filled 2018-06-01 (×2): qty 100

## 2018-06-01 MED ORDER — ENOXAPARIN SODIUM 40 MG/0.4ML ~~LOC~~ SOLN: 40.0000 mg | SUBCUTANEOUS | Status: DC

## 2018-06-01 MED ORDER — DEXTROSE-NACL 5-0.45 % IV SOLN
INTRAVENOUS | Status: DC
  Administered 2018-06-01: 01:00:00 via INTRAVENOUS

## 2018-06-01 MED ORDER — SODIUM CHLORIDE 0.9 % IV SOLN: INTRAVENOUS | Status: AC

## 2018-06-01 MED ORDER — INSULIN ASPART 100 UNIT/ML ~~LOC~~ SOLN: 0.0000 [IU] | Freq: Every day | SUBCUTANEOUS | Status: DC

## 2018-06-01 MED ORDER — MORPHINE SULFATE (PF) 2 MG/ML IV SOLN
2.0000 mg | INTRAVENOUS | Status: DC | PRN
  Administered 2018-06-01: 2 mg via INTRAVENOUS
  Filled 2018-06-01: qty 1

## 2018-06-01 MED ORDER — LACTATED RINGERS IV SOLN
INTRAVENOUS | Status: DC
  Administered 2018-06-01: 09:00:00 via INTRAVENOUS

## 2018-06-01 MED ORDER — OXYCODONE-ACETAMINOPHEN 5-325 MG PO TABS
1.0000 | ORAL_TABLET | ORAL | Status: DC | PRN
  Administered 2018-06-01 (×2): 1 via ORAL
  Filled 2018-06-01 (×2): qty 1

## 2018-06-01 MED ORDER — INSULIN DETEMIR 100 UNIT/ML ~~LOC~~ SOLN
20.0000 [IU] | Freq: Every day | SUBCUTANEOUS | Status: DC
  Administered 2018-06-01: 06:00:00 20 [IU] via SUBCUTANEOUS
  Filled 2018-06-01 (×2): qty 0.2

## 2018-06-01 MED ORDER — PROMETHAZINE HCL 25 MG/ML IJ SOLN
12.5000 mg | Freq: Four times a day (QID) | INTRAMUSCULAR | Status: DC | PRN
  Administered 2018-06-01: 01:00:00 12.5 mg via INTRAVENOUS
  Filled 2018-06-01: qty 1

## 2018-06-01 MED ORDER — SODIUM CHLORIDE 0.9 % IV SOLN: INTRAVENOUS | Status: DC

## 2018-06-01 MED ORDER — INSULIN REGULAR(HUMAN) IN NACL 100-0.9 UT/100ML-% IV SOLN
INTRAVENOUS | Status: DC
  Administered 2018-06-01: 1.5 [IU]/h via INTRAVENOUS

## 2018-06-01 MED ORDER — INSULIN LISPRO 100 UNIT/ML ~~LOC~~ SOLN: 4.0000 [IU] | Freq: Three times a day (TID) | SUBCUTANEOUS | 0 refills | Status: DC

## 2018-06-01 MED ORDER — CEPHALEXIN 500 MG PO CAPS: 500.0000 mg | ORAL_CAPSULE | Freq: Three times a day (TID) | ORAL | 0 refills | Status: AC

## 2018-06-01 MED ORDER — SODIUM CHLORIDE 0.9 % IV SOLN
INTRAVENOUS | Status: DC | PRN
  Administered 2018-06-01: 01:00:00 250 mL via INTRAVENOUS

## 2018-06-01 MED ORDER — INSULIN ASPART 100 UNIT/ML ~~LOC~~ SOLN
0.0000 [IU] | Freq: Three times a day (TID) | SUBCUTANEOUS | Status: DC
  Administered 2018-06-01: 3 [IU] via SUBCUTANEOUS
  Administered 2018-06-01: 5 [IU] via SUBCUTANEOUS
  Filled 2018-06-01 (×2): qty 1

## 2018-06-01 MED ORDER — BLOOD GLUCOSE MONITOR KIT: PACK | 0 refills | Status: DC

## 2018-06-01 MED ORDER — SODIUM CHLORIDE 0.9 % IV SOLN
1.0000 g | INTRAVENOUS | Status: DC
  Filled 2018-06-01: qty 10

## 2018-06-01 MED ORDER — ONDANSETRON HCL 4 MG/2ML IJ SOLN
4.0000 mg | Freq: Four times a day (QID) | INTRAMUSCULAR | Status: DC | PRN
  Administered 2018-06-01: 4 mg via INTRAVENOUS
  Filled 2018-06-01: qty 2

## 2018-06-01 MED ORDER — INSULIN DETEMIR 100 UNIT/ML FLEXPEN: 30.0000 [IU] | PEN_INJECTOR | Freq: Every day | SUBCUTANEOUS | 0 refills | Status: DC

## 2018-06-01 NOTE — Progress Notes (Signed)
eLink Physician-Brief Progress Note Patient Name: Mandy Patel DOB: 02/14/1995 MRN: [id number]   Date of Service  06/01/2018  HPI/Events of Note  23 yo Diabetic who presented in DKA and is receiving appropriate theray  eICU Interventions  New patient evaluation completed        [name] 06/01/2018, 1:12 AM

## 2018-06-01 NOTE — Progress Notes (Addendum)
Inpatient Diabetes Program Recommendations  AACE/ADA: New Consensus Statement on Inpatient Glycemic Control   Target Ranges:  Prepandial:   less than 140 mg/dL      Peak postprandial:   less than 180 mg/dL (1-2 hours)      Critically ill patients:  140 - 180 mg/dL   Results for Mandy Patel, Mandy Patel (MRN [id number]) as of 06/01/2018 07:33  Ref. Range 05/31/2018 22:34 06/01/2018 01:45 06/01/2018 02:49 06/01/2018 04:39 06/01/2018 05:43 06/01/2018 06:54  Glucose-Capillary Latest Ref Range: 70 - 99 mg/dL 774 (H) 128 (H) 786 (H) 178 (H) 173 (H) 168 (H)   Review of Glycemic Control  Diabetes history: DM1 (makes no insulin; requires basal, correction, and meal coverage insulin) Outpatient Diabetes medications: Levemir 30 units QHS, Humalog 4 units TID with meals Current orders for Inpatient glycemic control: Levemir 20 units daily, Novolog 0-15 units TID with meals, Novolog 0-5 units QHS  Inpatient Diabetes Program Recommendations:   VEH:MCNOB patient has been transitioned off IV insulin. Labs at 4:52 am today results show CO2 12 and AG 10.  May want to consider ordering beta-hydroxybuyrate to determine if acidosis is resolved.   Insulin - Basal: Noted patient received Levemir 20 units at 6:25 am today for transition off IV insulin drip.  Correction (SSI): Please consider decreasing Novolog correction to sensitive scale (0-9 units TID with meals).  Insulin - Meal Coverage: Please consider ordering Novolog 3 units TID with meals for meal coverage if patient eats at least 50% of meals.  HgbA1C: Please add on an A1C to blood in lab to evaluate glycemic control over the past 2-3 months. Last A1C in the chart was 12.2% on 09/05/18.   NOTE: Patient has Type 1 DM (makes NO insulin at all) and was admitted with DKA and pyelonephritis. Patient was started on an insulin drip and was given Levemir 20 units at 6:25 am today for transition off IV insulin. Noted labs at 4:52 am today CO2 12 and AG 10.  Last A1C was 12.2%  on 09/04/17 and patient was seen by Diabetes Cordinator on 09/05/17 during last hospital admission. Noted patient has no insurance or PCP. Will place consult for CM for medication and follow up needs. Will plan to talk with patient today regarding DM control.  Addendum 06/01/18@12 :45-Spoke with patient over the phone and she verified that she has no insurance or PCP (has not seen a provider in over 1 year). Patient has DM1 and is still using her mother's insulin. Patient states she takes Levemir 30 units QHS and Humalog 4 units TID with meals. Patient reports that she checks her glucose 2 times per day (in the morning and at night) and it is usually in mid 100's mg/dl-upper 096'G mg/dl). Patient states that she needs lancets. Informed patient that she can purchase lancets at the pharmacy relatively inexpensive. Discussed A1C results (13% on 06/01/18) and explained that her current A1C indicates an average glucose of 326 mg/dl over the past 2-3 months. Discussed glucose and A1C goals. Discussed importance of checking CBGs and maintaining good CBG control to prevent long-term and short-term complications. Explained how hyperglycemia leads to damage within blood vessels which lead to the common complications seen with uncontrolled diabetes. Stressed to the patient the importance of improving glycemic control to prevent further complications from uncontrolled diabetes. Stressed to the patient that she is only 23 years old and if she continues to have poor DM control, she is increasing her risks of complications from uncontrolled DM.  Discussed impact of  nutrition, exercise, stress, sickness, and medications on diabetes control. Patient states she tries to follow a carb modified diet and she no longer drinks sugary beverages. Patient is interested in going to [facility]. Encouraged patient to be sure to fill out application for both. Strongly encouraged patient to get DM under control, to  take insulin consistently, to check glucose 4 times per day, to establish care at [facility], and follow up routinely.  Patient verbalized understanding of information and she states that she has no questions or concerns at this time related to DM. Sent secure chat message to CM and Tama Headings. Gregory, RN, CM notes that [facility] and referral sent to [facility]. She also reports that clinic can assist patient with filling out an application for [facility].  Thanks, Orlando PennerMarie Makenzye Troutman, RN, MSN, CDE Diabetes Coordinator Inpatient Diabetes Program [phone] (Team Pager from 8am to 5pm)

## 2018-06-01 NOTE — Plan of Care (Signed)
Pt with DKA resolved.  Levemir administered.   Stop gtt at 0830.

## 2018-06-01 NOTE — Discharge Summary (Signed)
[facility] NAME: Mandy Patel    MR#:  [id number]  DATE OF BIRTH:  1995-03-01  DATE OF ADMISSION:  05/31/2018 ADMITTING PHYSICIAN: Lance Coon, MD  DATE OF DISCHARGE: 06/01/2018  PRIMARY CARE PHYSICIAN: Patient, No Pcp Per    ADMISSION DIAGNOSIS:  Pyelonephritis [N12] Nausea vomiting and diarrhea [R11.2, R19.7] Diabetic ketoacidosis without coma associated with type 1 diabetes mellitus ([place]) [E10.10]  DISCHARGE DIAGNOSIS:  Principal Problem:   DKA (diabetic ketoacidoses) ([place]) Active Problems:     Acute pyelonephritis   SECONDARY DIAGNOSIS:   Past Medical History:  Diagnosis Date  . Diabetes mellitus    type I, diagnosed age 13yo  . Pyelonephritis 03/2017    HOSPITAL COURSE:   23 year-old female with type 1 diabetes who presented to the ED with nausea,vomiting and found to be in DKA.  1.DKA: Patient has been treated for DKA and according to the Intensivist DKA has resolved. Her A1c is 13 and she will need close outpatient follow up.  2.  Sepsis with tachycardia and tachypnea due to acute pyelonephritis: Patient will be discharged on oral Keflex for 10 days. ECP will need to follow-up on final urine culture.   DISCHARGE CONDITIONS AND DIET:   Stable for discharge ADA diet  CONSULTS OBTAINED:    DRUG ALLERGIES:   Allergies  Allergen Reactions  . Peanut Oil Other (See Comments)    Reaction to peanuts - gums feel numb and tingling  . Other Swelling    Pecans caused throat swelling and weakness    DISCHARGE MEDICATIONS:   Allergies as of 06/01/2018      Reactions   Peanut Oil Other (See Comments)   Reaction to peanuts - gums feel numb and tingling   Other Swelling   Pecans caused throat swelling and weakness      Medication List    TAKE these medications   blood glucose meter kit and supplies Kit Dispense based on patient and insurance preference. Use up to four times daily as directed. (FOR  ICD-9 250.00, 250.01).   cephALEXin 500 MG capsule Commonly known as:  KEFLEX Take 1 capsule (500 mg total) by mouth 3 (three) times daily for 10 days.   etonogestrel 68 MG Impl implant Commonly known as:  NEXPLANON 1 each by Subdermal route once. Implanted Sept 2016   Insulin Detemir 100 UNIT/ML Pen Commonly known as:  Levemir FlexTouch Inject 30 Units into the skin at bedtime.   insulin lispro 100 UNIT/ML injection Commonly known as:  HumaLOG Inject 0.04 mLs (4 Units total) into the skin 3 (three) times daily with meals. What changed:  Another medication with the same name was removed. Continue taking this medication, and follow the directions you see here.         Today   CHIEF COMPLAINT:   Patient doing well this am no back pain no fevers had breakfast and tolerated this am   VITAL SIGNS:  Blood pressure 125/75, pulse (!) 108, temperature 98.5 F (36.9 C), temperature source Oral, resp. rate 18, height 5' 8"  (1.727 m), weight 71.4 kg, last menstrual period 05/17/2018, SpO2 100 %.   REVIEW OF SYSTEMS:  Review of Systems  Constitutional: Negative.  Negative for chills, fever and malaise/fatigue.  HENT: Negative.  Negative for ear discharge, ear pain, hearing loss, nosebleeds and sore throat.   Eyes: Negative.  Negative for blurred vision and pain.  Respiratory: Negative.  Negative for cough, hemoptysis, shortness of breath  and wheezing.   Cardiovascular: Negative.  Negative for chest pain, palpitations and leg swelling.  Gastrointestinal: Negative.  Negative for abdominal pain, blood in stool, diarrhea, nausea and vomiting.  Genitourinary: Negative.  Negative for dysuria.  Musculoskeletal: Negative.  Negative for back pain.  Skin: Negative.   Neurological: Negative for dizziness, tremors, speech change, focal weakness, seizures and headaches.  Endo/Heme/Allergies: Negative.  Does not bruise/bleed easily.  Psychiatric/Behavioral: Negative.  Negative for depression,  hallucinations and suicidal ideas.     PHYSICAL EXAMINATION:  GENERAL:  23 year-old patient lying in the bed with no acute distress.  NECK:  Supple, no jugular venous distention. No thyroid enlargement, no tenderness.  LUNGS: Normal breath sounds bilaterally, no wheezing, rales,rhonchi  No use of accessory muscles of respiration.  CARDIOVASCULAR: S1, S2 normal. No murmurs, rubs, or gallops.  ABDOMEN: Soft, non-tender, non-distended. Bowel sounds present. No organomegaly or mass.  EXTREMITIES: No pedal edema, cyanosis, or clubbing.  PSYCHIATRIC: The patient is alert and oriented x 3.  SKIN: No obvious rash, lesion, or ulcer.   DATA REVIEW:   CBC Recent Labs  Lab 06/01/18 0015  WBC 11.5*  HGB 11.3*  HCT 35.4*  PLT 331    Chemistries  Recent Labs  Lab 05/31/18 2017  06/01/18 0824  NA 132*   < > 134*  K 3.7   < > 3.8  CL 100   < > 112*  CO2 10*   < > 13*  GLUCOSE 271*   < > 159*  BUN 9   < > 5*  CREATININE 0.66   < > 0.49  CALCIUM 9.5   < > 8.7*  AST 7*  --   --   ALT 10  --   --   ALKPHOS 103  --   --   BILITOT 1.4*  --   --    < > = values in this interval not displayed.    Cardiac Enzymes No results for input(s): TROPONINI in the last 168 hours.  Microbiology Results  @[email or web address] @  RADIOLOGY:  Ct Renal Stone Study  Result Date: 05/31/2018 CLINICAL DATA:  Type 1 diabetic. Abdominal pain. Assess for renal stone disease. EXAM: CT ABDOMEN AND PELVIS WITHOUT CONTRAST TECHNIQUE: Multidetector CT imaging of the abdomen and pelvis was performed following the standard protocol without IV contrast. COMPARISON:  03/25/2018 FINDINGS: Lower chest: Normal Hepatobiliary: Normal without contrast. Pancreas: Normal Spleen: Normal Adrenals/Urinary Tract: Adrenal glands are normal. The kidneys are enlarged and show increased prominence of the renal collecting systems. Findings could go along with acute nephritis, diabetic glomerular nephropathy or low-grade obstruction. There  is no evidence of stone disease. Bladder appears normal. No stone in the bladder. Stomach/Bowel: Normal Vascular/Lymphatic: Normal Reproductive: Normal.  No pelvic mass. Other: Tiny amount of free fluid in the pelvic cul de sac, often seen in females of this age. Musculoskeletal: Negative IMPRESSION: Large kidneys with mild fullness of the renal collecting systems and ureters but no evidence of stone disease or obstructing lesion. Renal enlargement can be seen with diabetic glomerulonephropathy. However, the presence of the renal collecting system fullness raises possibility of urinary tract infection and acute nephritis. Electronically Signed   By: Nelson Chimes M.D.   On: 05/31/2018 21:58      Allergies as of 06/01/2018      Reactions   Peanut Oil Other (See Comments)   Reaction to peanuts - gums feel numb and tingling   Other Swelling   Pecans caused throat swelling and weakness  Medication List    TAKE these medications   blood glucose meter kit and supplies Kit Dispense based on patient and insurance preference. Use up to four times daily as directed. (FOR ICD-9 250.00, 250.01).   cephALEXin 500 MG capsule Commonly known as:  KEFLEX Take 1 capsule (500 mg total) by mouth 3 (three) times daily for 10 days.   etonogestrel 68 MG Impl implant Commonly known as:  NEXPLANON 1 each by Subdermal route once. Implanted Sept 2016   Insulin Detemir 100 UNIT/ML Pen Commonly known as:  Levemir FlexTouch Inject 30 Units into the skin at bedtime.   insulin lispro 100 UNIT/ML injection Commonly known as:  HumaLOG Inject 0.04 mLs (4 Units total) into the skin 3 (three) times daily with meals. What changed:  Another medication with the same name was removed. Continue taking this medication, and follow the directions you see here.          Management plans discussed with the patient and she is in agreement. Stable for discharge   Patient should follow up with pcp  CODE STATUS:      Code Status Orders  (From admission, onward)         Start     Ordered   06/01/18 0015  Full code  Continuous     06/01/18 0014        Code Status History    Date Active Date Inactive Code Status Order ID Comments User Context   09/04/2017 1432 09/05/2017 1609 Full Code [id number]  Gorden Harms, MD ED   03/15/2017 0533 03/17/2017 1835 Full Code [id number]  Rise Patience, MD ED   03/06/2017 1014 03/07/2017 1726 Full Code [id number]  Vashti Hey, MD ED   06/12/2015 1707 06/16/2015 2025 Full Code [id number]  Nicholes Mango, MD Inpatient      TOTAL TIME TAKING CARE OF THIS PATIENT: 38 minutes.    Note: This dictation was prepared with Dragon dictation along with smaller phrase technology. Any transcriptional errors that result from this process are unintentional.  Bettey Costa M.D on 06/01/2018 at 11:53 AM  Between 7am to 6pm - Pager - [phone] After 6pm go to [email or web address] - password [email or web address] [facility]  Office  [phone]  CC: Primary care physician; Patient, No Pcp Per

## 2018-06-01 NOTE — Plan of Care (Signed)
VSS on room air, ambulatory and A&O x 4, will transfer to room 104 after report is called, transport in wheelchair with belongings and chart.

## 2018-06-01 NOTE — TOC Transition Note (Signed)
Transition of Care [facility]) - CM/SW Discharge Note   Patient Details  Name: Mandy Patel MRN: [id number] Date of Birth: 17-Jan-1996  Transition of Care [facility]) CM/SW Contact:  Mandy Hilt, RN Phone Number: 06/01/2018, 12:25 PM   Clinical Narrative:    Met with the patient to discuss DC plan and needs, she lives with her parents and has no HH or DME needs, does not have insurance and needs Diabetic medications, I sent the RX over to Mandy Patel at [facility] and requested assistance with meds The patient says she does have a PCP That manages her DM Transportation is provided by her mother Mandy Patel  NO additional Medication needs  Final next level of care: Home/Self Care Barriers to Discharge: Barriers Resolved   Patient Goals and CMS Choice Patient states their goals for this hospitalization and ongoing recovery are:: go home      Discharge Placement                       Discharge Plan and Services   Discharge Planning Services: CM Consult, Medication Assistance                                 Social Determinants of Health (SDOH) Interventions     Readmission Risk Interventions No flowsheet data found.

## 2018-06-01 NOTE — Progress Notes (Signed)
Patient discharged home per MD order. Prescriptions given to patient and reviewed. All discharge instructions given and all questions answered. 

## 2018-06-01 NOTE — Consult Note (Signed)
Name: Mandy Patel MRN: [id number] DOB: 1995/03/09    ADMISSION DATE:  05/31/2018 CONSULTATION DATE:  05/31/2018  REFERRING MD :  Dr. Jannifer Franklin  CHIEF COMPLAINT:  Abdominal Pain, Emesis, Hyperglycemia  BRIEF PATIENT DESCRIPTION:  23 y.o. Female with PMH notable for DM type I admitted with DKA and Acute Pyelonephritis.  SIGNIFICANT EVENTS  05/31/18>> Admission to [facility]  STUDIES:  CT Renal Stone Study 05/31/18>> Large kidneys with mild fullness of the renal collecting systems and ureters but no evidence of stone disease or obstructing lesion. Renal enlargement can be seen with diabetic glomerulonephropathy. However, the presence of the renal collecting system fullness raises possibility of urinary tract infection and acute nephritis.  CULTURES: Blood x2 4/29>> Urine 4/29>>   ANTIBIOTICS: Rocephin 4/29>>  HISTORY OF PRESENT ILLNESS:   Mandy Patel is a 23 year old female with a past medical history notable for type 1 diabetes who presents to [facility] ED on 05/31/2018 with complaints of nausea, vomiting, abdominal pain, and diarrhea for approximately 3 to 4 days.  She reports left-sided crampy abdominal pain for the past 3 days.  She denies fever, chills, cough, shortness of breath, chest pain, or sick contacts.  She also denies dysuria, hematuria, or hematemesis.  Given her nausea and vomiting, she has not been able to take her Levemir. Initial work-up in the ED reveals glucose 271, serum bicarb 10, anion gap 22, sodium 132.  Venous blood gas with pH 7.25 /CO2 24/bicarb 10.5.  Urinalysis is positive for ketones and positive for UTI.  CT renal stone study revealed mild fullness of the renal collecting systems and ureters but no evidence of stone or obstructing lesions, also concerning for acute pyelonephritis.  She was given 2 L normal saline boluses, received IV Rocephin, and was placed on IV insulin drip.  She is being admitted to [facility] stepdown unit for treatment of DKA and acute  pyelonephritis.  PCCM is consulted for further management.  PAST MEDICAL HISTORY :   has a past medical history of Diabetes mellitus and Pyelonephritis (03/2017).  has a past surgical history that includes Eye muscle surgery (approx 2010). Prior to Admission medications   Medication Sig Start Date End Date Taking? Authorizing Provider  etonogestrel (NEXPLANON) 68 MG IMPL implant 1 each by Subdermal route once. Implanted Sept 2016   Yes [provider]  Insulin Detemir (LEVEMIR FLEXTOUCH) 100 UNIT/ML Pen Inject 30 Units into the skin at bedtime. 09/05/17  Yes Mody, Sital, MD  insulin lispro (HUMALOG) 100 UNIT/ML injection Inject 0.04 mLs (4 Units total) into the skin 3 (three) times daily with meals. 09/05/17  Yes Bettey Costa, MD  blood glucose meter kit and supplies KIT Dispense based on patient and insurance preference. Use up to four times daily as directed. (FOR ICD-9 250.00, 250.01). 09/05/17   Bettey Costa, MD  cephALEXin (KEFLEX) 500 MG capsule Take 1 capsule (500 mg total) by mouth 3 (three) times daily. Patient not taking: Reported on 05/31/2018 03/25/18   Paulette Blanch, MD  insulin lispro (HUMALOG) 100 UNIT/ML injection Take per sliding scale Patient not taking: Reported on 05/31/2018 09/05/17 09/05/18  Bettey Costa, MD   Allergies  Allergen Reactions   Peanut Oil Other (See Comments)    Reaction to peanuts - gums feel numb and tingling   Other Swelling    Pecans caused throat swelling and weakness    FAMILY HISTORY:  family history includes Alcohol abuse in an other family member; Arthritis in an other family member; Diabetes in  some other family members; Hyperlipidemia in some other family members; Hypertension in an other family member; Stroke in an other family member. SOCIAL HISTORY:  reports that she has never smoked. She has never used smokeless tobacco. She reports current drug use. Drug: Marijuana. She reports that she does not drink alcohol.   REVIEW OF SYSTEMS:  Positives  in BOLD Constitutional: Negative for fever, chills, weight loss, +malaise/fatigue and diaphoresis.  HENT: Negative for hearing loss, ear pain, nosebleeds, congestion, sore throat, neck pain, tinnitus and ear discharge.   Eyes: Negative for blurred vision, double vision, photophobia, pain, discharge and redness.  Respiratory: Negative for cough, hemoptysis, sputum production, shortness of breath, wheezing and stridor.   Cardiovascular: Negative for chest pain, palpitations, orthopnea, claudication, leg swelling and PND.  Gastrointestinal: Negative for heartburn, +nausea, vomiting, +abdominal pain, diarrhea, constipation, blood in stool and melena.  Genitourinary: Negative for dysuria, urgency, frequency, hematuria and flank pain.  Musculoskeletal: Negative for myalgias, back pain, joint pain and falls.  Skin: Negative for itching and rash.  Neurological: Negative for dizziness, tingling, tremors, sensory change, speech change, focal weakness, seizures, loss of consciousness, weakness and headaches.  Endo/Heme/Allergies: Negative for environmental allergies and polydipsia. Does not bruise/bleed easily.  SUBJECTIVE:  -Patient reports nausea (has improved since administration of Phenergan) and abdominal pain -Denies shortness of breath, chest pain, fever/chills, dizziness, diarrhea -On room air  VITAL SIGNS: Temp:  [98.1 F (36.7 C)-99 F (37.2 C)] 99 F (37.2 C) (04/30 0043) Pulse Rate:  [98-137] 118 (04/30 0043) Resp:  [18-24] 24 (04/30 0043) BP: (135-178)/(86-101) 148/87 (04/30 0043) SpO2:  [99 %-100 %] 100 % (04/30 0043) Weight:  [75.8 kg] 75.8 kg (04/29 2002)  PHYSICAL EXAMINATION: General: Acutely ill-appearing female, laying in bed, on room air, no acute distress Neuro: Sleeping, arouses to voice, alert and oriented, follows commands, no focal deficits, speech clear HEENT: Atraumatic, normocephalic, neck supple, no JVD Cardiovascular: Tachycardia, regular rhythm, S1-S2, no murmurs  rubs or gallops, 2+ pulses throughout Lungs: Clear to auscultation bilaterally, even, nonlabored, normal effort Abdomen: Soft, non-tender, nondistended, no guarding or rebound tenderness, hypoactive bowel sounds Musculoskeletal: Normal bulk and tone, no deformities, no edema Skin: Warm and dry, no obvious rashes lesions or ulcerations  Recent Labs  Lab 05/31/18 2017  NA 132*  K 3.7  CL 100  CO2 10*  BUN 9  CREATININE 0.66  GLUCOSE 271*   Recent Labs  Lab 05/31/18 2017  HGB 12.5  HCT 37.8  WBC 12.1*  PLT 372   Ct Renal Stone Study  Result Date: 05/31/2018 CLINICAL DATA:  Type 1 diabetic. Abdominal pain. Assess for renal stone disease. EXAM: CT ABDOMEN AND PELVIS WITHOUT CONTRAST TECHNIQUE: Multidetector CT imaging of the abdomen and pelvis was performed following the standard protocol without IV contrast. COMPARISON:  03/25/2018 FINDINGS: Lower chest: Normal Hepatobiliary: Normal without contrast. Pancreas: Normal Spleen: Normal Adrenals/Urinary Tract: Adrenal glands are normal. The kidneys are enlarged and show increased prominence of the renal collecting systems. Findings could go along with acute nephritis, diabetic glomerular nephropathy or low-grade obstruction. There is no evidence of stone disease. Bladder appears normal. No stone in the bladder. Stomach/Bowel: Normal Vascular/Lymphatic: Normal Reproductive: Normal.  No pelvic mass. Other: Tiny amount of free fluid in the pelvic cul de sac, often seen in females of this age. Musculoskeletal: Negative IMPRESSION: Large kidneys with mild fullness of the renal collecting systems and ureters but no evidence of stone disease or obstructing lesion. Renal enlargement can be seen with diabetic glomerulonephropathy.  However, the presence of the renal collecting system fullness raises possibility of urinary tract infection and acute nephritis. Electronically Signed   By: Nelson Chimes M.D.   On: 05/31/2018 21:58    ASSESSMENT /  PLAN:  DKA -Follow DKA protocol -Aggressive IV fluids -IV insulin drip -Follow BMP every 4 hours -Once anion gap closed and serum bicarb greater than 20, can transition to long-acting insulin (home Levemir) and sliding scale insulin -Consult diabetes coordinator, appreciate input -Check urine drug screen  Acute pyelonephritis -Monitor fever curve -Trend WBCs -Follow blood and urine cultures -Continue IV Rocephin -IV fluids -Levophed if needed to maintain MAP greater than 65  Nausea and vomiting -N.p.o. for now -PRN anti-emetics     Disposition: Stepdown Goals of care: Full code VTE prophylaxis: SQ Lovenox Updates: Updated patient at bedside 06/01/2018  Darel Hong, [facility] [place] [facility] Pager: [phone] Cell: [phone]  06/01/2018, 1:06 AM

## 2018-06-02 LAB — HIV ANTIBODY (ROUTINE TESTING W REFLEX): HIV Screen 4th Generation wRfx: NONREACTIVE

## 2018-06-03 LAB — URINE CULTURE: Culture: 10000 — AB

## 2018-06-05 LAB — CULTURE, BLOOD (ROUTINE X 2)
Culture: NO GROWTH
Culture: NO GROWTH
Special Requests: ADEQUATE
Special Requests: ADEQUATE

## 2018-06-16 ENCOUNTER — Other Ambulatory Visit: Payer: Self-pay

## 2018-06-16 ENCOUNTER — Encounter (HOSPITAL_COMMUNITY): Payer: Self-pay

## 2018-06-16 ENCOUNTER — Emergency Department (HOSPITAL_COMMUNITY): Payer: Self-pay

## 2018-06-16 ENCOUNTER — Inpatient Hospital Stay (HOSPITAL_COMMUNITY)
Admission: EM | Admit: 2018-06-16 | Discharge: 2018-06-19 | DRG: 871 | Disposition: A | Discharge status: Home / Self Care | Payer: Self-pay | Attending: Internal Medicine | Admitting: Internal Medicine

## 2018-06-16 DIAGNOSIS — Z20828 Contact with and (suspected) exposure to other viral communicable diseases: Secondary | ICD-10-CM | POA: Diagnosis present

## 2018-06-16 DIAGNOSIS — R739 Hyperglycemia, unspecified: Secondary | ICD-10-CM

## 2018-06-16 DIAGNOSIS — Z91128 Patient's intentional underdosing of medication regimen for other reason: Secondary | ICD-10-CM

## 2018-06-16 DIAGNOSIS — Z794 Long term (current) use of insulin: Secondary | ICD-10-CM

## 2018-06-16 DIAGNOSIS — E861 Hypovolemia: Secondary | ICD-10-CM | POA: Diagnosis present

## 2018-06-16 DIAGNOSIS — Y92009 Unspecified place in unspecified non-institutional (private) residence as the place of occurrence of the external cause: Secondary | ICD-10-CM

## 2018-06-16 DIAGNOSIS — Z91018 Allergy to other foods: Secondary | ICD-10-CM

## 2018-06-16 DIAGNOSIS — E860 Dehydration: Secondary | ICD-10-CM | POA: Diagnosis present

## 2018-06-16 DIAGNOSIS — R6520 Severe sepsis without septic shock: Secondary | ICD-10-CM

## 2018-06-16 DIAGNOSIS — Z91010 Allergy to peanuts: Secondary | ICD-10-CM

## 2018-06-16 DIAGNOSIS — Z9114 Patient's other noncompliance with medication regimen: Secondary | ICD-10-CM

## 2018-06-16 DIAGNOSIS — N12 Tubulo-interstitial nephritis, not specified as acute or chronic: Secondary | ICD-10-CM

## 2018-06-16 DIAGNOSIS — D649 Anemia, unspecified: Secondary | ICD-10-CM | POA: Diagnosis present

## 2018-06-16 DIAGNOSIS — R109 Unspecified abdominal pain: Secondary | ICD-10-CM

## 2018-06-16 DIAGNOSIS — E1010 Type 1 diabetes mellitus with ketoacidosis without coma: Secondary | ICD-10-CM | POA: Diagnosis present

## 2018-06-16 DIAGNOSIS — N179 Acute kidney failure, unspecified: Secondary | ICD-10-CM | POA: Diagnosis present

## 2018-06-16 DIAGNOSIS — R197 Diarrhea, unspecified: Secondary | ICD-10-CM | POA: Diagnosis present

## 2018-06-16 DIAGNOSIS — N390 Urinary tract infection, site not specified: Secondary | ICD-10-CM | POA: Diagnosis present

## 2018-06-16 DIAGNOSIS — E875 Hyperkalemia: Secondary | ICD-10-CM | POA: Diagnosis present

## 2018-06-16 DIAGNOSIS — A419 Sepsis, unspecified organism: Secondary | ICD-10-CM

## 2018-06-16 DIAGNOSIS — E1065 Type 1 diabetes mellitus with hyperglycemia: Secondary | ICD-10-CM

## 2018-06-16 DIAGNOSIS — Z87440 Personal history of urinary (tract) infections: Secondary | ICD-10-CM

## 2018-06-16 DIAGNOSIS — Z975 Presence of (intrauterine) contraceptive device: Secondary | ICD-10-CM

## 2018-06-16 DIAGNOSIS — R112 Nausea with vomiting, unspecified: Secondary | ICD-10-CM | POA: Diagnosis present

## 2018-06-16 DIAGNOSIS — T383X6A Underdosing of insulin and oral hypoglycemic [antidiabetic] drugs, initial encounter: Secondary | ICD-10-CM | POA: Diagnosis present

## 2018-06-16 DIAGNOSIS — E876 Hypokalemia: Secondary | ICD-10-CM | POA: Diagnosis not present

## 2018-06-16 DIAGNOSIS — Z833 Family history of diabetes mellitus: Secondary | ICD-10-CM

## 2018-06-16 DIAGNOSIS — E872 Acidosis: Secondary | ICD-10-CM

## 2018-06-16 DIAGNOSIS — R748 Abnormal levels of other serum enzymes: Secondary | ICD-10-CM | POA: Diagnosis present

## 2018-06-16 DIAGNOSIS — E1110 Type 2 diabetes mellitus with ketoacidosis without coma: Secondary | ICD-10-CM | POA: Diagnosis present

## 2018-06-16 DIAGNOSIS — A4151 Sepsis due to Escherichia coli [E. coli]: Principal | ICD-10-CM | POA: Diagnosis present

## 2018-06-16 DIAGNOSIS — E871 Hypo-osmolality and hyponatremia: Secondary | ICD-10-CM | POA: Diagnosis present

## 2018-06-16 LAB — URINALYSIS, ROUTINE W REFLEX MICROSCOPIC
Bacteria, UA: NONE SEEN
Bilirubin Urine: NEGATIVE
Glucose, UA: >=500 mg/dL — AB
Ketones, ur: 80 mg/dL — AB
Nitrite: NEGATIVE
Protein, ur: 30 mg/dL — AB
Specific Gravity, Urine: 1.016 (ref 1.005–1.030)
pH: 5 (ref 5.0–8.0)

## 2018-06-16 LAB — POCT I-STAT EG7
Acid-base deficit: 24 mmol/L — ABNORMAL HIGH (ref 0.0–2.0)
Bicarbonate: 4.4 mmol/L — ABNORMAL LOW (ref 20.0–28.0)
Calcium, Ion: 1.13 mmol/L — ABNORMAL LOW (ref 1.15–1.40)
HCT: 41 % (ref 36.0–46.0)
Hemoglobin: 13.9 g/dL (ref 12.0–15.0)
O2 Saturation: 88 %
Potassium: 5.6 mmol/L — ABNORMAL HIGH (ref 3.5–5.1)
Sodium: 131 mmol/L — ABNORMAL LOW (ref 135–145)
TCO2: <5 mmol/L — ABNORMAL LOW (ref 22–32)
pCO2, Ven: 15 mmHg — CL (ref 44.0–60.0)
pH, Ven: 7.076 — CL (ref 7.250–7.430)
pO2, Ven: 73 mmHg — ABNORMAL HIGH (ref 32.0–45.0)

## 2018-06-16 LAB — CBC WITH DIFFERENTIAL/PLATELET
Abs Immature Granulocytes: 0.34 10*3/uL — ABNORMAL HIGH (ref 0.00–0.07)
Basophils Absolute: 0.1 10*3/uL (ref 0.0–0.1)
Basophils Relative: 0 %
Eosinophils Absolute: 0.1 10*3/uL (ref 0.0–0.5)
Eosinophils Relative: 0 %
HCT: 41.3 % (ref 36.0–46.0)
Hemoglobin: 12.2 g/dL (ref 12.0–15.0)
Immature Granulocytes: 1 %
Lymphocytes Relative: 3 %
Lymphs Abs: 1 10*3/uL (ref 0.7–4.0)
MCH: 30 pg (ref 26.0–34.0)
MCHC: 29.5 g/dL — ABNORMAL LOW (ref 30.0–36.0)
MCV: 101.7 fL — ABNORMAL HIGH (ref 80.0–100.0)
Monocytes Absolute: 2 10*3/uL — ABNORMAL HIGH (ref 0.1–1.0)
Monocytes Relative: 6 %
Neutro Abs: 30.3 10*3/uL — ABNORMAL HIGH (ref 1.7–7.7)
Neutrophils Relative %: 90 %
Platelets: 369 10*3/uL (ref 150–400)
RBC: 4.06 MIL/uL (ref 3.87–5.11)
RDW: 13.4 % (ref 11.5–15.5)
WBC: 33.8 10*3/uL — ABNORMAL HIGH (ref 4.0–10.5)
nRBC: 0 % (ref 0.0–0.2)

## 2018-06-16 LAB — BASIC METABOLIC PANEL
Anion gap: 17 — ABNORMAL HIGH (ref 5–15)
Anion gap: 16 — ABNORMAL HIGH (ref 5–15)
Anion gap: 12 (ref 5–15)
BUN: 19 mg/dL (ref 6–20)
BUN: 17 mg/dL (ref 6–20)
BUN: 14 mg/dL (ref 6–20)
CO2: 9 mmol/L — ABNORMAL LOW (ref 22–32)
CO2: 10 mmol/L — ABNORMAL LOW (ref 22–32)
CO2: 16 mmol/L — ABNORMAL LOW (ref 22–32)
Calcium: 8.7 mg/dL — ABNORMAL LOW (ref 8.9–10.3)
Calcium: 8.8 mg/dL — ABNORMAL LOW (ref 8.9–10.3)
Calcium: 8.5 mg/dL — ABNORMAL LOW (ref 8.9–10.3)
Chloride: 113 mmol/L — ABNORMAL HIGH (ref 98–111)
Chloride: 115 mmol/L — ABNORMAL HIGH (ref 98–111)
Chloride: 107 mmol/L (ref 98–111)
Creatinine, Ser: 1.38 mg/dL — ABNORMAL HIGH (ref 0.44–1.00)
Creatinine, Ser: 1.16 mg/dL — ABNORMAL HIGH (ref 0.44–1.00)
Creatinine, Ser: 0.83 mg/dL (ref 0.44–1.00)
GFR calc Af Amer: >60 mL/min (ref >60)
GFR calc Af Amer: >60 mL/min (ref >60)
GFR calc Af Amer: >60 mL/min (ref >60)
GFR calc non Af Amer: 54 mL/min — ABNORMAL LOW (ref >60)
GFR calc non Af Amer: >60 mL/min (ref >60)
GFR calc non Af Amer: >60 mL/min (ref >60)
Glucose, Bld: 346 mg/dL — ABNORMAL HIGH (ref 70–99)
Glucose, Bld: 199 mg/dL — ABNORMAL HIGH (ref 70–99)
Glucose, Bld: 227 mg/dL — ABNORMAL HIGH (ref 70–99)
Potassium: 5.5 mmol/L — ABNORMAL HIGH (ref 3.5–5.1)
Potassium: 4.2 mmol/L (ref 3.5–5.1)
Potassium: 3.8 mmol/L (ref 3.5–5.1)
Sodium: 139 mmol/L (ref 135–145)
Sodium: 141 mmol/L (ref 135–145)
Sodium: 135 mmol/L (ref 135–145)

## 2018-06-16 LAB — GLUCOSE, CAPILLARY
Glucose-Capillary: 134 mg/dL — ABNORMAL HIGH (ref 70–99)
Glucose-Capillary: 161 mg/dL — ABNORMAL HIGH (ref 70–99)
Glucose-Capillary: 175 mg/dL — ABNORMAL HIGH (ref 70–99)
Glucose-Capillary: 178 mg/dL — ABNORMAL HIGH (ref 70–99)
Glucose-Capillary: 186 mg/dL — ABNORMAL HIGH (ref 70–99)
Glucose-Capillary: 194 mg/dL — ABNORMAL HIGH (ref 70–99)
Glucose-Capillary: 195 mg/dL — ABNORMAL HIGH (ref 70–99)
Glucose-Capillary: 202 mg/dL — ABNORMAL HIGH (ref 70–99)
Glucose-Capillary: 214 mg/dL — ABNORMAL HIGH (ref 70–99)
Glucose-Capillary: 285 mg/dL — ABNORMAL HIGH (ref 70–99)

## 2018-06-16 LAB — COMPREHENSIVE METABOLIC PANEL
ALT: 12 U/L (ref 0–44)
AST: 9 U/L — ABNORMAL LOW (ref 15–41)
Albumin: 3.9 g/dL (ref 3.5–5.0)
Alkaline Phosphatase: 129 U/L — ABNORMAL HIGH (ref 38–126)
BUN: 23 mg/dL — ABNORMAL HIGH (ref 6–20)
CO2: <7 mmol/L — ABNORMAL LOW (ref 22–32)
Calcium: 9 mg/dL (ref 8.9–10.3)
Chloride: 99 mmol/L (ref 98–111)
Creatinine, Ser: 1.78 mg/dL — ABNORMAL HIGH (ref 0.44–1.00)
GFR calc Af Amer: 46 mL/min — ABNORMAL LOW (ref >60)
GFR calc non Af Amer: 40 mL/min — ABNORMAL LOW (ref >60)
Glucose, Bld: 594 mg/dL (ref 70–99)
Potassium: 5.4 mmol/L — ABNORMAL HIGH (ref 3.5–5.1)
Sodium: 133 mmol/L — ABNORMAL LOW (ref 135–145)
Total Bilirubin: 1.8 mg/dL — ABNORMAL HIGH (ref 0.3–1.2)
Total Protein: 8.9 g/dL — ABNORMAL HIGH (ref 6.5–8.1)

## 2018-06-16 LAB — SARS CORONAVIRUS 2 BY RT PCR (HOSPITAL ORDER, PERFORMED IN ~~LOC~~ HOSPITAL LAB): SARS Coronavirus 2: NEGATIVE

## 2018-06-16 LAB — CBG MONITORING, ED
Glucose-Capillary: 566 mg/dL (ref 70–99)
Glucose-Capillary: 487 mg/dL — ABNORMAL HIGH (ref 70–99)
Glucose-Capillary: 418 mg/dL — ABNORMAL HIGH (ref 70–99)
Glucose-Capillary: 522 mg/dL (ref 70–99)
Glucose-Capillary: 341 mg/dL — ABNORMAL HIGH (ref 70–99)

## 2018-06-16 LAB — I-STAT BETA HCG BLOOD, ED (MC, WL, AP ONLY): I-stat hCG, quantitative: 11.2 m[IU]/mL — ABNORMAL HIGH (ref <5)

## 2018-06-16 LAB — HCG, SERUM, QUALITATIVE: Preg, Serum: NEGATIVE

## 2018-06-16 LAB — LACTIC ACID, PLASMA: Lactic Acid, Venous: 2.2 mmol/L (ref 0.5–1.9)

## 2018-06-16 MED ORDER — METOCLOPRAMIDE HCL 5 MG/ML IJ SOLN
5.0000 mg | Freq: Once | INTRAMUSCULAR | Status: AC
  Administered 2018-06-16: 5 mg via INTRAVENOUS
  Filled 2018-06-16: qty 2

## 2018-06-16 MED ORDER — METOCLOPRAMIDE HCL 5 MG/ML IJ SOLN
5.0000 mg | Freq: Four times a day (QID) | INTRAMUSCULAR | Status: DC | PRN
  Administered 2018-06-16 – 2018-06-17 (×3): 5 mg via INTRAVENOUS
  Filled 2018-06-16 (×3): qty 2

## 2018-06-16 MED ORDER — ONDANSETRON HCL 4 MG PO TABS: 4.0000 mg | ORAL_TABLET | Freq: Four times a day (QID) | ORAL | Status: DC | PRN

## 2018-06-16 MED ORDER — DEXTROSE-NACL 5-0.45 % IV SOLN
INTRAVENOUS | Status: DC
  Administered 2018-06-16 – 2018-06-18 (×2): via INTRAVENOUS

## 2018-06-16 MED ORDER — MORPHINE SULFATE (PF) 2 MG/ML IV SOLN: 2.0000 mg | INTRAVENOUS | Status: DC | PRN

## 2018-06-16 MED ORDER — DEXTROSE 5 % AND 0.45 % NACL IV BOLUS: 1000.0000 mL | Freq: Once | INTRAVENOUS | Status: DC

## 2018-06-16 MED ORDER — DEXTROSE-NACL 5-0.45 % IV SOLN: INTRAVENOUS | Status: DC

## 2018-06-16 MED ORDER — SODIUM CHLORIDE 0.9 % IV SOLN: 1.0000 g | INTRAVENOUS | Status: DC

## 2018-06-16 MED ORDER — SODIUM CHLORIDE 0.9 % IV SOLN
INTRAVENOUS | Status: DC
  Administered 2018-06-16: 14:00:00 via INTRAVENOUS

## 2018-06-16 MED ORDER — INSULIN REGULAR(HUMAN) IN NACL 100-0.9 UT/100ML-% IV SOLN
INTRAVENOUS | Status: DC
  Administered 2018-06-16: 4.3 [IU]/h via INTRAVENOUS
  Filled 2018-06-16 (×3): qty 100

## 2018-06-16 MED ORDER — SODIUM CHLORIDE 0.9 % IV SOLN
1.0000 g | Freq: Once | INTRAVENOUS | Status: AC
  Administered 2018-06-16: 1 g via INTRAVENOUS
  Filled 2018-06-16: qty 10

## 2018-06-16 MED ORDER — SODIUM CHLORIDE 0.9 % IV SOLN
Freq: Once | INTRAVENOUS | Status: AC
  Administered 2018-06-16: 11:00:00 via INTRAVENOUS

## 2018-06-16 MED ORDER — STERILE WATER FOR INJECTION IV SOLN
INTRAVENOUS | Status: DC
  Administered 2018-06-16: 14:00:00 via INTRAVENOUS
  Filled 2018-06-16 (×3): qty 850

## 2018-06-16 MED ORDER — MORPHINE SULFATE (PF) 2 MG/ML IV SOLN
2.0000 mg | INTRAVENOUS | Status: AC | PRN
  Administered 2018-06-16 – 2018-06-19 (×4): 2 mg via INTRAVENOUS
  Filled 2018-06-16 (×4): qty 1

## 2018-06-16 MED ORDER — ONDANSETRON HCL 4 MG/2ML IJ SOLN
4.0000 mg | Freq: Once | INTRAMUSCULAR | Status: AC
  Administered 2018-06-16: 4 mg via INTRAVENOUS
  Filled 2018-06-16: qty 2

## 2018-06-16 MED ORDER — SODIUM CHLORIDE 0.9 % IV BOLUS
1000.0000 mL | Freq: Once | INTRAVENOUS | Status: AC
  Administered 2018-06-16: 1000 mL via INTRAVENOUS

## 2018-06-16 MED ORDER — SODIUM CHLORIDE 0.9 % IV BOLUS: 1000.0000 mL | Freq: Once | INTRAVENOUS | Status: DC

## 2018-06-16 MED ORDER — ACETAMINOPHEN 325 MG PO TABS: 650.0000 mg | ORAL_TABLET | Freq: Four times a day (QID) | ORAL | Status: DC | PRN

## 2018-06-16 MED ORDER — ENOXAPARIN SODIUM 40 MG/0.4ML ~~LOC~~ SOLN
40.0000 mg | SUBCUTANEOUS | Status: DC
  Administered 2018-06-16 – 2018-06-19 (×4): 40 mg via SUBCUTANEOUS
  Filled 2018-06-16 (×4): qty 0.4

## 2018-06-16 MED ORDER — SODIUM CHLORIDE 0.9 % IV SOLN
Freq: Once | INTRAVENOUS | Status: AC
  Administered 2018-06-16: 10:00:00 via INTRAVENOUS

## 2018-06-16 MED ORDER — ACETAMINOPHEN 650 MG RE SUPP: 650.0000 mg | Freq: Four times a day (QID) | RECTAL | Status: DC | PRN

## 2018-06-16 MED ORDER — PANTOPRAZOLE SODIUM 40 MG PO TBEC
40.0000 mg | DELAYED_RELEASE_TABLET | Freq: Every day | ORAL | Status: DC
  Administered 2018-06-16 – 2018-06-19 (×4): 40 mg via ORAL
  Filled 2018-06-16 (×4): qty 1

## 2018-06-16 MED ORDER — ONDANSETRON HCL 4 MG/2ML IJ SOLN: 4.0000 mg | Freq: Four times a day (QID) | INTRAMUSCULAR | Status: DC | PRN

## 2018-06-16 NOTE — ED Notes (Signed)
[place] called and coming to room to see pt. ICU provider at bedside.

## 2018-06-16 NOTE — ED Notes (Addendum)
Critical care provider came to see pt, her ordered to give pt another 1L bolus of NS, and then to change maintenance fluids to lactated ringers after the chemistry comes back and if the K level is less than 5. Continue NS infusion at 100/hr until then. Do not draw lactic acid until 3rd Liter of NS has gone in.

## 2018-06-16 NOTE — ED Notes (Signed)
Provider notified of glucose of 594 and that pt is still vomiting, to order another dose of nausea medications

## 2018-06-16 NOTE — ED Provider Notes (Addendum)
Medical screening examination/treatment/procedure(s) were conducted as a shared visit with non-physician practitioner(s) and myself.  I personally evaluated the patient during the encounter. Briefly, the patient is a 23 y.o. female with history of type insulin-dependent diabetes who presents to the ED with hyperglycemia.  Patient with history of DKA.  Patient states that she has been noncompliant with her insulin for the last several days.  Patient has some left flank pain, nausea, vomiting.  Patient admitted recently for pyelonephritis and DKA.  Patient with tachycardia, tachypnea.  She appears to have coo smalls breathing on exam.  Concern for DKA possibly from urine infection and noncompliance.  Will give normal saline bolus and get lab work to rule out DKA, infectious source.  Patient with white count of 34.  Blood sugar 566.  AKI with a creatinine of 1.78.  Potassium is 5.4.  Anion gap is high.  Suspect patient with DKA likely from noncompliance and possibly from urinary tract infection. Hx of recurrent pyelonephritis. Chest x-ray showed no signs of infection.  Will test for coronavirus.  Patient with good mentation.  Patient given 2 L of normal saline and started on maintenance fluids.  Will start insulin drip and admit for further care.  Hemodynamically stable throughout my care.  Normal mentation.  Blood cultures have been collected.  Lower concern for intra-abdominal process at this time and awaiting urinalysis.  This chart was dictated using voice recognition software.  Despite best efforts to proofread,  errors can occur which can change the documentation meaning.     EKG Interpretation  Date/Time:  Friday Jun 16 2018 08:10:01 EDT Ventricular Rate:  129 PR Interval:    QRS Duration: 88 QT Interval:  334 QTC Calculation: 490 R Axis:   88 Text Interpretation:  Sinus tachycardia Probable left atrial enlargement Borderline prolonged QT interval Confirmed by Virgina Norfolk [phone]) on 06/16/2018  8:18:08 AM           Virgina Norfolk, DO 06/16/18 1914    Virgina Norfolk, DO 06/16/18 [phone]

## 2018-06-16 NOTE — ED Triage Notes (Addendum)
Per [facility], pt from home with complaint of uncontrolled DM type 2, has not taken her meds x 2 days and has been having n/v for same time. Hx of noncompliance. Tachy and tachypneic.

## 2018-06-16 NOTE — Consult Note (Signed)
NAME:  Mandy Patel, MRN:  [id number], DOB:  21-Mar-1995, LOS: 0 ADMISSION DATE:  06/16/2018, CONSULTATION DATE:  5/15 REFERRING MD:  Ronnald Nian, CHIEF COMPLAINT:  DKA   Brief History   23 year old female who presented  w/ cc: 2 d h/o N/V and left flank pain w/ dysuria. Had not been taking insulin as was at boyfriends house. In ER dx eval c/w: +SIRS; possible sepsis (concern for pyelo), diabetic Ketoacidosis & AKI. PH via VBG: 7.01; IVFs administered; insulin gtt started. PCCM asked to eval given concern about metabolic derangements.   (recent admit for pyelo April 2020)  History of present illness   See above  Past Medical History  Diabetes type I, non-compliance w/ meds, recent pyelonephritis   Significant Hospital Events   5/15 presented w/ cc: 2 d h/o N/V and left flank pain. In ER dx eval c/w: +SIRS; possible sepsis (concern for pyelo), diabetic Ketoacidosis & AKI. PH via VBG: 7.01; IVFs administered; insulin gtt started. PCCM asked to eval given concern about metabolic derangements.   Consults:  PCCM consulted 5/15 in ER due to metabolic derangements   Procedures:    Significant Diagnostic Tests:   Beta Hcg: Positive   Micro Data:  SARS Coronavirus 5/15: neg  BCX2 5/15>>> UC 5/15>>> Antimicrobials:  CTX 5/15>>>  Interim history/subjective:   Feels better since being here in ED  Objective   Blood pressure 131/69, pulse (Abnormal) 136, temperature 97.8 F (36.6 C), temperature source Oral, resp. rate (Abnormal) 24, height 5' 8"  (1.727 m), weight 74.8 kg, last menstrual period 06/03/2018, SpO2 100 %.        Intake/Output Summary (Last 24 hours) at 06/16/2018 1041 Last data filed at 06/16/2018 5956 Gross per 24 hour  Intake 2275 ml  Output no documentation  Net 2275 ml   Filed Weights   06/16/18 0759  Weight: 74.8 kg    Examination: General: Well-developed 23 year old female patient lying in bed she denies acute distress HENT: Mucous membranes are dry and  cracked neck veins flat sclera nonicteric Lungs: Clear to auscultation Cardiovascular: Remains tachycardic Abdomen: Soft nontender no organomegaly Extremities: Warm and dry brisk capillary refill Neuro: Alert and oriented GU: Voiding  Resolved Hospital Problem list     Assessment & Plan:  DKA in setting of known DM type I -presume due to mix of non-compliance as well as possible sepsis -acid base evaluated. Appears to be pure AG acidosis. Suspect mostly DKA but not uncommon to have elevated lactate as well which could be seen in sepsis OR simply volume depletion and organ hypoperfusion Plan Crystalloid bolus and IVF resuscitation (repeating 3rd bolus for on-going tachycardia) Start IV insulin and DKA protocol  Ck Hgb A1c, lactate after 3rd fluid bolus and repeat after additional  Fluid if elevated.  Would change MIVFs to LR as soon as K less than 5 to avoid iatrogenic hyperchloremia  Serial chemistries She is compensated likely as well as she can from a pulm stand-point. Would avoid ventilation at all costs as would be difficult to meet her demand No role for bicarb at this point.  Admit to IM service  Sepsis w/ concern for UT source (left pyelo) Plan Send UC, BC Empiric rocephin IVFs Admit to SDU Serial CBCs  AKI in setting of volume depletion Plan Renal US Aggressive IVFs Renal dose meds  Serial chem Strict I&O  Fluid and electrolyte imbalance: Pseudohyperkalemia and hyponatremia Plan Aggressive IVFs Serial labs  R/o pregnancy (Bhcg elevated) Plan Per IM service  Best practice:  Diet: NPO Pain/Anxiety/Delirium protocol (if indicated): na VAP protocol (if indicated): na DVT prophylaxis: Foley heparin  GI prophylaxis: na Glucose control: DKA protocol  Mobility: BR Code Status: full code  Family Communication: Pending Disposition: Admit to medicine  Labs   CBC: Recent Labs  Lab 06/16/18 0840 06/16/18 0853  WBC 33.8*  --   NEUTROABS PENDING  --   HGB  12.2 13.9  HCT 41.3 41.0  MCV 101.7*  --   PLT 369  --     Basic Metabolic Panel: Recent Labs  Lab 06/16/18 0840 06/16/18 0853  NA 133* 131*  K 5.4* 5.6*  CL 99  --   CO2 <7*  --   GLUCOSE 594*  --   BUN 23*  --   CREATININE 1.78*  --   CALCIUM 9.0  --    GFR: Estimated Creatinine Clearance: 50 mL/min (A) (by C-G formula based on SCr of 1.78 mg/dL (H)). Recent Labs  Lab 06/16/18 0840  WBC 33.8*    Liver Function Tests: Recent Labs  Lab 06/16/18 0840  AST 9*  ALT 12  ALKPHOS 129*  BILITOT 1.8*  PROT 8.9*  ALBUMIN 3.9   No results for input(s): LIPASE, AMYLASE in the last 168 hours. No results for input(s): AMMONIA in the last 168 hours.  ABG    Component Value Date/Time   PHART 7.167 (LL) 03/06/2017 0912   PCO2ART 24.7 (L) 03/06/2017 0912   PO2ART 31.0 (LL) 03/06/2017 0912   HCO3 4.4 (L) 06/16/2018 0853   TCO2 <5 (L) 06/16/2018 0853   ACIDBASEDEF 24.0 (H) 06/16/2018 0853   O2SAT 88.0 06/16/2018 0853     Coagulation Profile: No results for input(s): INR, PROTIME in the last 168 hours.  Cardiac Enzymes: No results for input(s): CKTOTAL, CKMB, CKMBINDEX, TROPONINI in the last 168 hours.  HbA1C: Hgb A1c MFr Bld  Date/Time Value Ref Range Status  06/01/2018 08:24 AM 13.0 (H) 4.8 - 5.6 % Final    Comment:    (NOTE) Pre diabetes:          5.7%-6.4% Diabetes:              >6.4% Glycemic control for   <7.0% adults with diabetes   09/04/2017 03:36 PM 12.2 (H) 4.8 - 5.6 % Final    Comment:    (NOTE) Pre diabetes:          5.7%-6.4% Diabetes:              >6.4% Glycemic control for   <7.0% adults with diabetes     CBG: Recent Labs  Lab 06/16/18 0758 06/16/18 0924 06/16/18 1034  GLUCAP 566* 487* 522*    Review of Systems:   Review of Systems - History obtained from the patient General ROS: positive for  - fatigue and malaise negative for - chills or fever ENT ROS: negative Hematological and Lymphatic ROS: negative Endocrine ROS:  positive for - malaise/lethargy and polydipsia/polyuria negative for - hot flashes, palpitations, skin changes or temperature intolerance Respiratory ROS: positive for - shortness of breath negative for - cough, hemoptysis, orthopnea, pleuritic pain, sputum changes, stridor, tachypnea or wheezing Cardiovascular ROS: no chest pain or dyspnea on exertion Gastrointestinal ROS: positive for - nausea/vomiting negative for - abdominal pain, diarrhea, heartburn or hematemesis Musculoskeletal ROS: negative Neurological ROS: negative + dysuria  Past Medical History  She,  has a past medical history of Diabetes mellitus and Pyelonephritis (03/2017).   Surgical History    Past Surgical  History:  Procedure Laterality Date  . EYE MUSCLE SURGERY  approx 2010   bilat eye surgury, left exotropia worse;Dr Annamaria Boots, opthomology     Social History   reports that she has never smoked. She has never used smokeless tobacco. She reports current drug use. Drug: Marijuana. She reports that she does not drink alcohol.   Family History   Her family history includes Alcohol abuse in an other family member; Arthritis in an other family member; Diabetes in some other family members; Hyperlipidemia in some other family members; Hypertension in an other family member; Stroke in an other family member.   Allergies Allergies  Allergen Reactions  . Peanut Oil Other (See Comments)    Reaction to peanuts - gums feel numb and tingling  . Other Swelling    Pecans caused throat swelling and weakness     Home Medications  Prior to Admission medications   Medication Sig Start Date End Date Taking? Authorizing Provider  etonogestrel (NEXPLANON) 68 MG IMPL implant 1 each by Subdermal route once. **Implanted Sept 2016**   Yes [provider]  Insulin Detemir (LEVEMIR FLEXTOUCH) 100 UNIT/ML Pen Inject 30 Units into the skin at bedtime. 06/01/18  Yes Mody, Ulice Bold, MD  insulin lispro (HUMALOG) 100 UNIT/ML injection  Inject 0.04 mLs (4 Units total) into the skin 3 (three) times daily with meals. 06/01/18  Yes Bettey Costa, MD  blood glucose meter kit and supplies KIT Dispense based on patient and insurance preference. Use up to four times daily as directed. (FOR ICD-9 250.00, 250.01). 06/01/18   Bettey Costa, MD     Critical care time: Powers ACNP-BC [place] Pager # [phone] OR # [phone] if no answer

## 2018-06-16 NOTE — Progress Notes (Addendum)
Inpatient Diabetes Program Recommendations  AACE/ADA: New Consensus Statement on Inpatient Glycemic Control (2015)  Target Ranges:  Prepandial:   less than 140 mg/dL      Peak postprandial:   less than 180 mg/dL (1-2 hours)      Critically ill patients:  140 - 180 mg/dL   Lab Results  Component Value Date   GLUCAP 285 (H) 06/16/2018   HGBA1C 13.0 (H) 06/01/2018    Review of Glycemic Control  Diabetes history: DM1 Outpatient Diabetes medications: Levemir 30 units + Humalog 4 units tid Current orders for Inpatient glycemic control: IV insulin drip  Inpatient Diabetes Program Recommendations:   DM coordinator spoke with patient on 06/01/18 regarding elevated A1c and reviewed options for followup diabetes care. Noted patient did not take medications over the past 2 days. Will followup with patient. Repeat BMET is pending @ present. When patient meets criteria for transition to subcutaneous insulin, please give basal insulin 2 hrs. Prior to discontinuation of IV insulin and give Novolog correction when IV drip discontinued.  Spoke with patient briefly regarding need to take basal insulin even though she is not able to eat, but patient still not feeling well and has nausea.  Thank you, Billy Fischer. Leshawn Houseworth, RN, MSN, CDE  Diabetes Coordinator Inpatient Glycemic Control Team Team Pager [phone] (8am-5pm) 06/16/2018 2:14 PM

## 2018-06-16 NOTE — Progress Notes (Signed)
Mandy Patel [id number]  Code Status: FULL  Admission Data: 06/16/2018 2:16 PM  Attending Provider: Katrinka Blazing  D[facility], No Pcp Per  Consults/ Treatment Team:   [name] is a 23 y.o. female patient admitted from ED awake, alert - oriented X 4 - no acute distress noted. VSS - Blood pressure 120/72, pulse (!) 132, temperature 97.8 F (36.6 C), temperature source Oral, resp. rate 20, height 5\' 8"  (1.727 m), weight 74.8 kg, last menstrual period 06/03/2018, SpO2 100 %. no c/o shortness of breath, no c/o chest pain. Cardiac tele # [id number] in place. Orientation to room, and floor completed with information packet given to patient/family. Admission INP armband ID verified with patient/family, and in place.  SR up x 2, fall assessment complete, with patient and family able to verbalize understanding of risk associated with falls, and verbalized understanding to call nsg before up out of bed. Call light within reach, patient able to voice, and demonstrate understanding. Skin, clean-dry- intact without evidence of bruising, or skin tears.  No evidence of skin break down noted on exam.  ?  Will cont to eval and treat per MD orders.  Jon Gills, RN  06/16/2018 2:16 PM

## 2018-06-16 NOTE — ED Notes (Signed)
ED TO INPATIENT HANDOFF REPORT  ED Nurse Name and Phone #: Caryn Bee [phone]  S Name/Age/Gender Mandy Patel 23 y.o. female Room/Bed: 018C/018C  Code Status   Code Status: Full Code  Home/SNF/Other Home Patient oriented to: self, place, time and situation Is this baseline? Yes   Triage Complete: Triage complete  Chief Complaint hyperglycemia  Triage Note Per [facility], pt from home with complaint of uncontrolled DM type 2, has not taken her meds x 2 days and has been having n/v for same time. Hx of noncompliance. Tachy and tachypneic.    Allergies Allergies  Allergen Reactions  . Peanut Oil Other (See Comments)    Reaction to peanuts - gums feel numb and tingling  . Other Swelling    Pecans caused throat swelling and weakness    Level of Care/Admitting Diagnosis ED Disposition    ED Disposition Condition Comment   Admit  Hospital Area: [facility] [[id number]]  Level of Care: Progressive [102]  I expect the patient will be discharged within 24 hours: No (not a candidate for 5C-Observation unit)  Covid Evaluation: Screening Protocol (No Symptoms)  Diagnosis: DKA, type 1 [facility]) [[id number]]  Admitting Physician: Clydie Braun [[id number]]  Attending Physician: Clydie Braun [[id number]]  PT Class (Do Not Modify): Observation [104]  PT Acc Code (Do Not Modify): Observation [10022]       B Medical/Surgery History Past Medical History:  Diagnosis Date  . Diabetes mellitus    type I, diagnosed age 42yo  . Pyelonephritis 03/2017   Past Surgical History:  Procedure Laterality Date  . EYE MUSCLE SURGERY  approx 2010   bilat eye surgury, left exotropia worse;Dr Maple Hudson, opthomology     A IV Location/Drains/Wounds Patient Lines/Drains/Airways Status   Active Line/Drains/Airways    Name:   Placement date:   Placement time:   Site:   Days:   Peripheral IV 06/16/18 Left Antecubital   06/16/18    -    Antecubital   less than 1          Intake/Output Last  24 hours  Intake/Output Summary (Last 24 hours) at 06/16/2018 1230 Last data filed at 06/16/2018 1152 Gross per 24 hour  Intake 2575 ml  Output 1000 ml  Net 1575 ml    Labs/Imaging Results for orders placed or performed during the hospital encounter of 06/16/18 (from the past 48 hour(s))  POC CBG, ED     Status: Abnormal   Collection Time: 06/16/18  7:58 AM  Result Value Ref Range   Glucose-Capillary 566 (HH) 70 - 99 mg/dL   Comment 1 Notify RN   Urinalysis, Routine w reflex microscopic     Status: Abnormal   Collection Time: 06/16/18  8:00 AM  Result Value Ref Range   Color, Urine STRAW (A) YELLOW   APPearance CLEAR CLEAR   Specific Gravity, Urine 1.016 1.005 - 1.030   pH 5.0 5.0 - 8.0   Glucose, UA >=500 (A) NEGATIVE mg/dL   Hgb urine dipstick SMALL (A) NEGATIVE   Bilirubin Urine NEGATIVE NEGATIVE   Ketones, ur 80 (A) NEGATIVE mg/dL   Protein, ur 30 (A) NEGATIVE mg/dL   Nitrite NEGATIVE NEGATIVE   Leukocytes,Ua SMALL (A) NEGATIVE   RBC / HPF 6-10 0 - 5 RBC/hpf   WBC, UA 11-20 0 - 5 WBC/hpf   Bacteria, UA NONE SEEN NONE SEEN   Squamous Epithelial / LPF 0-5 0 - 5   Mucus PRESENT     Comment: Performed at  [facility], [address] [place], [place] [address]  Comprehensive metabolic panel     Status: Abnormal   Collection Time: 06/16/18  8:40 AM  Result Value Ref Range   Sodium 133 (L) 135 - 145 mmol/L   Potassium 5.4 (H) 3.5 - 5.1 mmol/L   Chloride 99 98 - 111 mmol/L   CO2 <7 (L) 22 - 32 mmol/L    Comment: REPEATED TO VERIFY   Glucose, Bld 594 (HH) 70 - 99 mg/dL    Comment: CRITICAL RESULT CALLED TO, READ BACK BY AND VERIFIED WITH: K BROWN,RN AT 0919 06/16/2018 BY L BENFIELD    BUN 23 (H) 6 - 20 mg/dL   Creatinine, Ser 0.86 (H) 0.44 - 1.00 mg/dL   Calcium 9.0 8.9 - 57.8 mg/dL   Total Protein 8.9 (H) 6.5 - 8.1 g/dL   Albumin 3.9 3.5 - 5.0 g/dL   AST 9 (L) 15 - 41 U/L   ALT 12 0 - 44 U/L   Alkaline Phosphatase 129 (H) 38 - 126 U/L   Total Bilirubin 1.8 (H)  0.3 - 1.2 mg/dL   GFR calc non Af Amer 40 (L) >60 mL/min   GFR calc Af Amer 46 (L) >60 mL/min   Anion gap NOT CALCULATED 5 - 15    Comment: Performed at [facility], [address] [place], [place] [address]  CBC with Differential     Status: Abnormal   Collection Time: 06/16/18  8:40 AM  Result Value Ref Range   WBC 33.8 (H) 4.0 - 10.5 K/uL    Comment: REPEATED TO VERIFY   RBC 4.06 3.87 - 5.11 MIL/uL   Hemoglobin 12.2 12.0 - 15.0 g/dL   HCT 95.2 84.1 - 32.4 %   MCV 101.7 (H) 80.0 - 100.0 fL   MCH 30.0 26.0 - 34.0 pg   MCHC 29.5 (L) 30.0 - 36.0 g/dL   RDW 40.1 02.7 - 25.3 %   Platelets 369 150 - 400 K/uL   nRBC 0.0 0.0 - 0.2 %   Neutrophils Relative % 90 %   Neutro Abs 30.3 (H) 1.7 - 7.7 K/uL   Lymphocytes Relative 3 %   Lymphs Abs 1.0 0.7 - 4.0 K/uL   Monocytes Relative 6 %   Monocytes Absolute 2.0 (H) 0.1 - 1.0 K/uL   Eosinophils Relative 0 %   Eosinophils Absolute 0.1 0.0 - 0.5 K/uL   Basophils Relative 0 %   Basophils Absolute 0.1 0.0 - 0.1 K/uL   RBC Morphology MORPHOLOGY UNREMARKABLE    Immature Granulocytes 1 %   Abs Immature Granulocytes 0.34 (H) 0.00 - 0.07 K/uL    Comment: Performed at [facility], [address], [place], [place] [address]  I-Stat beta hCG blood, ED     Status: Abnormal   Collection Time: 06/16/18  8:46 AM  Result Value Ref Range   I-stat hCG, quantitative 11.2 (H) <5 mIU/mL   Comment 3            Comment:   GEST. AGE      CONC.  (mIU/mL)   <=1 WEEK        5 - 50     2 WEEKS       50 - 500     3 WEEKS       100 - 10,000     4 WEEKS     1,000 - 30,000        FEMALE AND NON-PREGNANT  FEMALE:     LESS THAN 5 mIU/mL   POCT I-Stat EG7     Status: Abnormal   Collection Time: 06/16/18  8:53 AM  Result Value Ref Range   pH, Ven 7.076 (LL) 7.250 - 7.430   pCO2, Ven 15.0 (LL) 44.0 - 60.0 mmHg   pO2, Ven 73.0 (H) 32.0 - 45.0 mmHg   Bicarbonate 4.4 (L) 20.0 - 28.0 mmol/L   TCO2 <5 (L) 22 - 32 mmol/L   O2 Saturation 88.0 %   Acid-base  deficit 24.0 (H) 0.0 - 2.0 mmol/L   Sodium 131 (L) 135 - 145 mmol/L   Potassium 5.6 (H) 3.5 - 5.1 mmol/L   Calcium, Ion 1.13 (L) 1.15 - 1.40 mmol/L   HCT 41.0 36.0 - 46.0 %   Hemoglobin 13.9 12.0 - 15.0 g/dL   Patient temperature HIDE    Sample type VENOUS    Comment NOTIFIED PHYSICIAN   SARS Coronavirus 2 (CEPHEID- Performed in [facility] hospital lab), Hosp Order     Status: None   Collection Time: 06/16/18  9:09 AM  Result Value Ref Range   SARS Coronavirus 2 NEGATIVE NEGATIVE    Comment: (NOTE) If result is NEGATIVE SARS-CoV-2 target nucleic acids are NOT DETECTED. The SARS-CoV-2 RNA is generally detectable in upper and lower  respiratory specimens during the acute phase of infection. The lowest  concentration of SARS-CoV-2 viral copies this assay can detect is 250  copies / mL. A negative result does not preclude SARS-CoV-2 infection  and should not be used as the sole basis for treatment or other  patient management decisions.  A negative result may occur with  improper specimen collection / handling, submission of specimen other  than nasopharyngeal swab, presence of viral mutation(s) within the  areas targeted by this assay, and inadequate number of viral copies  (<250 copies / mL). A negative result must be combined with clinical  observations, patient history, and epidemiological information. If result is POSITIVE SARS-CoV-2 target nucleic acids are DETECTED. The SARS-CoV-2 RNA is generally detectable in upper and lower  respiratory specimens dur ing the acute phase of infection.  Positive  results are indicative of active infection with SARS-CoV-2.  Clinical  correlation with patient history and other diagnostic information is  necessary to determine patient infection status.  Positive results do  not rule out bacterial infection or co-infection with other viruses. If result is PRESUMPTIVE POSTIVE SARS-CoV-2 nucleic acids MAY BE PRESENT.   A presumptive positive  result was obtained on the submitted specimen  and confirmed on repeat testing.  While 2019 novel coronavirus  (SARS-CoV-2) nucleic acids may be present in the submitted sample  additional confirmatory testing may be necessary for epidemiological  and / or clinical management purposes  to differentiate between  SARS-CoV-2 and other Sarbecovirus currently known to infect humans.  If clinically indicated additional testing with an alternate test  methodology [phone]) is advised. The SARS-CoV-2 RNA is generally  detectable in upper and lower respiratory sp ecimens during the acute  phase of infection. The expected result is Negative. Fact Sheet for Patients:  BoilerBrush.com.cy Fact Sheet for Healthcare Providers: https://pope.com/ This test is not yet approved or cleared by the [place] FDA and has been authorized for detection and/or diagnosis of SARS-CoV-2 by FDA under an Emergency Use Authorization (EUA).  This EUA will remain in effect (meaning this test can be used) for the duration of the COVID-19 declaration under Section 564(b)(1) of the Act, 21 U.S.C. section  360bbb-3(b)(1), unless the authorization is terminated or revoked sooner. Performed at [facility], [address] [place], [place] [address]   POC CBG, ED     Status: Abnormal   Collection Time: 06/16/18  9:24 AM  Result Value Ref Range   Glucose-Capillary 487 (H) 70 - 99 mg/dL  POC CBG, ED     Status: Abnormal   Collection Time: 06/16/18 10:34 AM  Result Value Ref Range   Glucose-Capillary 522 (HH) 70 - 99 mg/dL   Comment 1 Document in Chart   POC CBG, ED     Status: Abnormal   Collection Time: 06/16/18 11:28 AM  Result Value Ref Range   Glucose-Capillary 418 (H) 70 - 99 mg/dL   [place] Renal  Result Date: 06/16/2018 CLINICAL DATA:  Flank pain EXAM: RENAL / URINARY TRACT ULTRASOUND COMPLETE COMPARISON:  05/31/2018 FINDINGS: Right Kidney: Renal measurements:  13.5 x 5.1 x 6.0 cm = volume: 217 mL. Mild hydronephrosis is noted similar to that seen on prior CT examination. Left Kidney: Renal measurements: 12.1 x 5.9 x 6.0 cm = volume: 226 mL. Mild hydronephrosis is noted similar to that seen on prior CT examination. Bladder: Appears normal for degree of bladder distention. IMPRESSION: Mild hydronephrosis bilaterally stable from the previous exam. No other focal abnormality is noted. Electronically Signed   By: Alcide CleverMark  Lukens M.D.   On: 06/16/2018 12:21   Dg Chest Portable 1 View  Result Date: 06/16/2018 CLINICAL DATA:  Sob  weakdka EXAM: PORTABLE CHEST 1 VIEW COMPARISON:  09/04/2017 FINDINGS: Normal mediastinum and cardiac silhouette. Normal pulmonary vasculature. No evidence of effusion, infiltrate, or pneumothorax. No acute bony abnormality. IMPRESSION: Normal chest radiograph. Electronically Signed   By: Genevive BiStewart  Edmunds M.D.   On: 06/16/2018 08:41    Pending Labs Unresulted Labs (From admission, onward)    Start     Ordered   06/17/18 0500  CBC  Tomorrow morning,   R     06/16/18 1209   06/16/18 1205  Basic metabolic panel  STAT Now then every 4 hours ,   STAT     06/16/18 1209   06/16/18 1117  Urine culture  ONCE - STAT,   STAT     06/16/18 1116   06/16/18 1105  Lactic acid, plasma  Once,   R     06/16/18 1104   06/16/18 0806  Blood culture (routine x 2)  BLOOD CULTURE X 2,   STAT     06/16/18 0805          Vitals/Pain Today's Vitals   06/16/18 1100 06/16/18 1115 06/16/18 1115 06/16/18 1130  BP: 136/69 135/70  129/74  Pulse: (!) 139   (!) 139  Resp: (!) 27   (!) 24  Temp:      TempSrc:      SpO2: 100%   100%  Weight:      Height:      PainSc:   0-No pain     Isolation Precautions Droplet and Contact precautions  Medications Medications  insulin regular, human (MYXREDLIN) 100 units/ 100 mL infusion (7.2 Units/hr Intravenous Rate/Dose Change 06/16/18 1137)  sodium bicarbonate 150 mEq in sterile water 1,000 mL infusion (has no  administration in time range)  enoxaparin (LOVENOX) injection 40 mg (has no administration in time range)  ondansetron (ZOFRAN) tablet 4 mg (has no administration in time range)    Or  ondansetron (ZOFRAN) injection 4 mg (has no administration in time range)  acetaminophen (TYLENOL) tablet 650  mg (has no administration in time range)    Or  acetaminophen (TYLENOL) suppository 650 mg (has no administration in time range)  0.9 %  sodium chloride infusion (has no administration in time range)  dextrose 5 %-0.45 % sodium chloride infusion (has no administration in time range)  sodium chloride 0.9 % bolus 1,000 mL (0 mLs Intravenous Stopped 06/16/18 0926)  ondansetron (ZOFRAN) injection 4 mg (4 mg Intravenous Given 06/16/18 0835)  sodium chloride 0.9 % bolus 1,000 mL (0 mLs Intravenous Stopped 06/16/18 0951)  metoCLOPramide (REGLAN) injection 5 mg (5 mg Intravenous Given 06/16/18 0902)  0.9 %  sodium chloride infusion ( Intravenous Stopped 06/16/18 1118)  metoCLOPramide (REGLAN) injection 5 mg (5 mg Intravenous Given 06/16/18 0926)  0.9 %  sodium chloride infusion ( Intravenous New Bag/Given 06/16/18 1119)  cefTRIAXone (ROCEPHIN) 1 g in sodium chloride 0.9 % 100 mL IVPB (0 g Intravenous Stopped 06/16/18 1152)    Mobility walks Low fall risk   Focused Assessments GI   R Recommendations: See Admitting Provider Note  Report given to:   Additional Notes: glucostablizer

## 2018-06-16 NOTE — Progress Notes (Signed)
CRITICAL VALUE ALERT  Critical Value:  Lactic acid 2.2  Date & Time Notied:  06/16/18 at 1410  Provider Notified: Dr. Katrinka Blazing  Orders Received/Actions taken: N/A

## 2018-06-16 NOTE — Progress Notes (Deleted)
Scrotum alveated with sheets stack.  OOB to chair, Patient has shower and foley care done.

## 2018-06-16 NOTE — ED Notes (Signed)
Pt has been speaking with family and significant other about her care during her visit.

## 2018-06-16 NOTE — H&P (Addendum)
History and Physical    Mandy Patel OFB:[id number] DOB: 12/01/1995 DOA: 06/16/2018  Referring MD/NP/PA: Delia Heady PA-C PCP: Patient, No Pcp Per  Patient coming from: Via EMS  Chief Complaint: Nausea and vomiting  I have personally briefly reviewed patient's old medical records in [place]   HPI: Mandy Patel is a 23 y.o. female with medical history significant of diabetes mellitus type 1 uncontrolled and pyelonephritis; who presents with intractable nausea and vomiting for the last 2 days.  She is from [place], but was visiting her boyfriend here in [place] for the last 2 days and forgot to bring her insulin.  She was just admitted to [facility] 4/29-4/30 for DKA with pyelonephritis.  Patient was discharged home with a 10-day course of Keflex for which she states that she may have missed a dose.  She states the last 2 days she complains of abdominal pain with non bloody emesis. She has not been able to keep any significant amount of food or liquids down.  Associated symptoms include dysuria.  Denies having any other complaints at this time.  ED Course: Upon admission into the emergency department patient was noted to be afebrile, pulse 129-139, respiration 22-32, and all other vital signs maintained.  Labs revealed a BC 33.8, hemoglobin 12.2, sodium 133, potassium 5.6, CO2<7, BUN 23, creatinine 1.78, glucose 594, i-STAT hCG 11.2, alkaline phosphatase 129, total protein 8.9, and total bilirubin 1.8.  Urinalysis did not show any significant signs of infection.  Patient was ordered 2 liters of normal saline IV fluids, Reglan, Zofran, Rocephin IV, and started on insulin drip.    Review of Systems  Constitutional: Negative for chills, fever and malaise/fatigue.  HENT: Negative for nosebleeds and sinus pain.   Eyes: Negative for double vision and photophobia.  Respiratory: Negative for cough and shortness of breath.   Cardiovascular: Negative for chest pain and leg swelling.   Gastrointestinal: Positive for abdominal pain, nausea and vomiting. Negative for constipation.  Genitourinary: Positive for dysuria. Negative for hematuria.  Musculoskeletal: Negative for joint pain and myalgias.  Skin: Negative for itching and rash.  Neurological: Negative for focal weakness and loss of consciousness.  Psychiatric/Behavioral: Negative for suicidal ideas. The patient is not nervous/anxious.   All other systems reviewed and are negative.   Past Medical History:  Diagnosis Date  . Diabetes mellitus    type I, diagnosed age 79yo  . Pyelonephritis 03/2017    Past Surgical History:  Procedure Laterality Date  . EYE MUSCLE SURGERY  approx 2010   bilat eye surgury, left exotropia worse;Dr Annamaria Boots, opthomology     reports that she has never smoked. She has never used smokeless tobacco. She reports current drug use. Drug: Marijuana. She reports that she does not drink alcohol.  Allergies  Allergen Reactions  . Peanut Oil Other (See Comments)    Reaction to peanuts - gums feel numb and tingling  . Other Swelling    Pecans caused throat swelling and weakness    Family History  Problem Relation Age of Onset  . Alcohol abuse Other   . Arthritis Other   . Hyperlipidemia Other   . Stroke Other   . Diabetes Other   . Hyperlipidemia Other   . Hypertension Other   . Diabetes Other     Prior to Admission medications   Medication Sig Start Date End Date Taking? Authorizing Provider  etonogestrel (NEXPLANON) 68 MG IMPL implant 1 each by Subdermal route once. **Implanted Sept 2016**  Yes [provider]  Insulin Detemir (LEVEMIR FLEXTOUCH) 100 UNIT/ML Pen Inject 30 Units into the skin at bedtime. 06/01/18  Yes Mody, Ulice Bold, MD  insulin lispro (HUMALOG) 100 UNIT/ML injection Inject 0.04 mLs (4 Units total) into the skin 3 (three) times daily with meals. 06/01/18  Yes Bettey Costa, MD  blood glucose meter kit and supplies KIT Dispense based on patient and insurance  preference. Use up to four times daily as directed. (FOR ICD-9 250.00, 250.01). 06/01/18   Bettey Costa, MD    Physical Exam:  Constitutional: Ill-appearing young female Vitals:   06/16/18 1045 06/16/18 1100 06/16/18 1115 06/16/18 1130  BP: (!) 142/84 136/69 135/70 129/74  Pulse: (!) 138 (!) 139  (!) 139  Resp: (!) 26 (!) 27  (!) 24  Temp:      TempSrc:      SpO2: 100% 100%  100%  Weight:      Height:       Eyes: PERRL, lids and conjunctivae normal ENMT: Mucous membranes are dry. Posterior pharynx clear of any exudate or lesions.  Neck: normal, supple, no masses, no thyromegaly Respiratory: Tachypneic with no significant wheezing Cardiovascular: Tachycardic, no murmurs / rubs / gallops. No extremity edema. 2+ pedal pulses. No carotid bruits.  Abdomen: Generalized tenderness, no masses palpated. No hepatosplenomegaly. Bowel sounds positive.  Musculoskeletal: no clubbing / cyanosis. No joint deformity upper and lower extremities. Good ROM, no contractures. Normal muscle tone.  Skin: no rashes, lesions, ulcers. No induration Neurologic: CN 2-12 grossly intact. Sensation intact, DTR normal. Strength 5/5 in all 4.  Psychiatric: Poor insight.  Lethargic and oriented x 3. Normal mood.     Labs on Admission: I have personally reviewed following labs and imaging studies  CBC: Recent Labs  Lab 06/16/18 0840 06/16/18 0853  WBC 33.8*  --   NEUTROABS 30.3*  --   HGB 12.2 13.9  HCT 41.3 41.0  MCV 101.7*  --   PLT 369  --    Basic Metabolic Panel: Recent Labs  Lab 06/16/18 0840 06/16/18 0853  NA 133* 131*  K 5.4* 5.6*  CL 99  --   CO2 <7*  --   GLUCOSE 594*  --   BUN 23*  --   CREATININE 1.78*  --   CALCIUM 9.0  --    GFR: Estimated Creatinine Clearance: 50 mL/min (A) (by C-G formula based on SCr of 1.78 mg/dL (H)). Liver Function Tests: Recent Labs  Lab 06/16/18 0840  AST 9*  ALT 12  ALKPHOS 129*  BILITOT 1.8*  PROT 8.9*  ALBUMIN 3.9   No results for input(s):  LIPASE, AMYLASE in the last 168 hours. No results for input(s): AMMONIA in the last 168 hours. Coagulation Profile: No results for input(s): INR, PROTIME in the last 168 hours. Cardiac Enzymes: No results for input(s): CKTOTAL, CKMB, CKMBINDEX, TROPONINI in the last 168 hours. BNP (last 3 results) No results for input(s): PROBNP in the last 8760 hours. HbA1C: No results for input(s): HGBA1C in the last 72 hours. CBG: Recent Labs  Lab 06/16/18 0758 06/16/18 0924 06/16/18 1034 06/16/18 1128  GLUCAP 566* 487* 522* 418*   Lipid Profile: No results for input(s): CHOL, HDL, LDLCALC, TRIG, CHOLHDL, LDLDIRECT in the last 72 hours. Thyroid Function Tests: No results for input(s): TSH, T4TOTAL, FREET4, T3FREE, THYROIDAB in the last 72 hours. Anemia Panel: No results for input(s): VITAMINB12, FOLATE, FERRITIN, TIBC, IRON, RETICCTPCT in the last 72 hours. Urine analysis:    Component Value Date/Time  COLORURINE STRAW (A) 06/16/2018 0800   APPEARANCEUR CLEAR 06/16/2018 0800   LABSPEC 1.016 06/16/2018 0800   PHURINE 5.0 06/16/2018 0800   GLUCOSEU >=500 (A) 06/16/2018 0800   GLUCOSEU >=1000 12/01/2010 1051   HGBUR SMALL (A) 06/16/2018 0800   BILIRUBINUR NEGATIVE 06/16/2018 0800   KETONESUR 80 (A) 06/16/2018 0800   PROTEINUR 30 (A) 06/16/2018 0800   UROBILINOGEN 0.2 12/01/2010 1051   NITRITE NEGATIVE 06/16/2018 0800   LEUKOCYTESUR SMALL (A) 06/16/2018 0800   Sepsis Labs: Recent Results (from the past 240 hour(s))  SARS Coronavirus 2 (CEPHEID- Performed in [place] hospital lab), Hosp Order     Status: None   Collection Time: 06/16/18  9:09 AM  Result Value Ref Range Status   SARS Coronavirus 2 NEGATIVE NEGATIVE Final    Comment: (NOTE) If result is NEGATIVE SARS-CoV-2 target nucleic acids are NOT DETECTED. The SARS-CoV-2 RNA is generally detectable in upper and lower  respiratory specimens during the acute phase of infection. The lowest  concentration of SARS-CoV-2 viral  copies this assay can detect is 250  copies / mL. A negative result does not preclude SARS-CoV-2 infection  and should not be used as the sole basis for treatment or other  patient management decisions.  A negative result may occur with  improper specimen collection / handling, submission of specimen other  than nasopharyngeal swab, presence of viral mutation(s) within the  areas targeted by this assay, and inadequate number of viral copies  (<250 copies / mL). A negative result must be combined with clinical  observations, patient history, and epidemiological information. If result is POSITIVE SARS-CoV-2 target nucleic acids are DETECTED. The SARS-CoV-2 RNA is generally detectable in upper and lower  respiratory specimens dur ing the acute phase of infection.  Positive  results are indicative of active infection with SARS-CoV-2.  Clinical  correlation with patient history and other diagnostic information is  necessary to determine patient infection status.  Positive results do  not rule out bacterial infection or co-infection with other viruses. If result is PRESUMPTIVE POSTIVE SARS-CoV-2 nucleic acids MAY BE PRESENT.   A presumptive positive result was obtained on the submitted specimen  and confirmed on repeat testing.  While 2019 novel coronavirus  (SARS-CoV-2) nucleic acids may be present in the submitted sample  additional confirmatory testing may be necessary for epidemiological  and / or clinical management purposes  to differentiate between  SARS-CoV-2 and other Sarbecovirus currently known to infect humans.  If clinically indicated additional testing with an alternate test  methodology [phone]) is advised. The SARS-CoV-2 RNA is generally  detectable in upper and lower respiratory sp ecimens during the acute  phase of infection. The expected result is Negative. Fact Sheet for Patients:  StrictlyIdeas.no Fact Sheet for Healthcare Providers:  BankingDealers.co.za This test is not yet approved or cleared by the [place] FDA and has been authorized for detection and/or diagnosis of SARS-CoV-2 by FDA under an Emergency Use Authorization (EUA).  This EUA will remain in effect (meaning this test can be used) for the duration of the COVID-19 declaration under Section 564(b)(1) of the Act, 21 U.S.C. section 360bbb-3(b)(1), unless the authorization is terminated or revoked sooner. Performed at [facility], [place] [address] [place], [place] [address]      Radiological Exams on Admission: Dg Chest Portable 1 View  Result Date: 06/16/2018 CLINICAL DATA:  Sob  weakdka EXAM: PORTABLE CHEST 1 VIEW COMPARISON:  09/04/2017 FINDINGS: Normal mediastinum and cardiac silhouette. Normal pulmonary vasculature.  No evidence of effusion, infiltrate, or pneumothorax. No acute bony abnormality. IMPRESSION: Normal chest radiograph. Electronically Signed   By: Suzy Bouchard M.D.   On: 06/16/2018 08:41    EKG: Independently reviewed.  Sinus tachycardia 129 bpm with early signs of T wave peaking and QT C of 490  Assessment/Plan DKA, type I: Patient presents complaints of nausea, vomiting, and abdominal pain.  On admission with glucose 594, CO2 <7, and anion gap unable to be calculated.  Urinalysis positive for glucose and ketones. Venous pH 7.076.  Critical care was consulted.  Hemoglobin A1c 13 during last admission to the hospital on 05/31/2018.  -Admit to progressive bed -Glucose stabilizer protocol initiated  -Insulin drip -Sodium bicarb drip at 100 mL/h -Serial BMPs q. 4 hours x 4 -Correct electrolytes as needed -Monitoring for AG closure and will transition to subcutaneous insulin once able -Diabetes education consult  Nausea and vomiting, abdominal pain: Acute.  Suspect secondary to above. -N.p.o. and advance diet as tolerated -Reglan as needed for nausea and vomiting  Hyperkalemia: Acute.  Initial potassium  noted to be elevated at 5.6.  Early T wave peaking noted on EKG. -IV fluids as seen above -Follow-up serial BMPs  Recent pyelonephritis, urinary tract infection, hydronephrosis: Urinalysis positive for signs of infection.  Renal ultrasound showing mild bilateral hydronephrosis.  She reports taking most of the antibiotics as prescribed from recent hospitalization on 4/30 for pyelonephritis. -Place Foley catheter -Follow-up urine culture -Continue empiric antibiotics of Rocephin for now  Acute renal failure: Patient seen to be significantly dehydrated with nausea and vomiting symptoms.  Creatinine previously have been within normal limits, but she presents with creatinine elevated up to 1.78 with BUN 23.  Patient was given 2 L normal saline IV fluids. -Continue IV fluids as seen above. -Continue to monitor kidney function  Elevated i-STAT hCG: I-STAT hCGelevated at 11.2. -Check serum hCG  Borderline prolonged QT interval: Elevation QTc intervals are noted to be borderline at 490.  Patient was given Reglan twice and Zofran while in the ED. -Recheck EKG (QTc 474)  Elevated liver enzymes: Labs revealed mildly elevated alkaline phosphatase of 129 and total bilirubin of 1.8.  Suspect secondary to dehydration. -Continue to monitor  DVT prophylaxis:  lovenox  Code Status: Full Family Communication: Family present at bedside Disposition Plan: Possible discharge home in 1 to 2 days Consults called: None Admission status: Observation  Norval Morton MD [facility] Pager [phone]   If 7PM-7AM, please contact night-coverage [email or web address] Password [email or web address]  06/16/2018, 12:01 PM

## 2018-06-16 NOTE — ED Provider Notes (Signed)
[facility] EMERGENCY DEPARTMENT Provider Note   CSN: [id number] Arrival date & time: 06/16/18  0753    History   Chief Complaint Chief Complaint  Patient presents with  . Hyperglycemia    HPI Mandy Patel is a 23 y.o. female with a past medical history of type 1 diabetes, DKA, presents to the ED for 2-day history of nausea, vomiting, diarrhea and left-sided abdominal pain.  States that she is not taking any of her medications including her insulin in the past 2 days because she was staying at her boyfriend's house.  She woke up this morning with worsening of her symptoms and feels like she is in DKA.  She denies any fever, cough, shortness of breath, chest pain, sick contacts with similar symptoms, urinary symptoms or possibility of pregnancy.     HPI  Past Medical History:  Diagnosis Date  . Diabetes mellitus    type I, diagnosed age 23yo  . Pyelonephritis 03/2017    Patient Active Problem List   Diagnosis Date Noted  . Sepsis ([employer]) 09/04/2017  . Type 1 diabetes mellitus with hyperglycemia ([place]) 03/15/2017  . Acute pyelonephritis 03/15/2017  . Acute right flank pain 03/15/2017  . Influenza A 03/06/2017  . Vomiting 03/06/2017  . DKA (diabetic ketoacidoses) ([place]) 06/12/2015  . Type I (juvenile type) diabetes mellitus without mention of complication, uncontrolled 04/26/2011  . Preventative health care 12/01/2010  . Obesity 06/19/2010  . Goiter, unspecified 06/19/2010  . HYPERCHOLESTEROLEMIA 04/25/2008    Past Surgical History:  Procedure Laterality Date  . EYE MUSCLE SURGERY  approx 2010   bilat eye surgury, left exotropia worse;Dr Annamaria Boots, opthomology     OB History   No obstetric history on file.      Home Medications    Prior to Admission medications   Medication Sig Start Date End Date Taking? Authorizing Provider  etonogestrel (NEXPLANON) 68 MG IMPL implant 1 each by Subdermal route once. **Implanted Sept 2016**   Yes [provider]  Insulin Detemir (LEVEMIR FLEXTOUCH) 100 UNIT/ML Pen Inject 30 Units into the skin at bedtime. 06/01/18  Yes Mody, Ulice Bold, MD  insulin lispro (HUMALOG) 100 UNIT/ML injection Inject 0.04 mLs (4 Units total) into the skin 3 (three) times daily with meals. 06/01/18  Yes Bettey Costa, MD  blood glucose meter kit and supplies KIT Dispense based on patient and insurance preference. Use up to four times daily as directed. (FOR ICD-9 250.00, 250.01). 06/01/18   Bettey Costa, MD    Family History Family History  Problem Relation Age of Onset  . Alcohol abuse Other   . Arthritis Other   . Hyperlipidemia Other   . Stroke Other   . Diabetes Other   . Hyperlipidemia Other   . Hypertension Other   . Diabetes Other     Social History Social History   Tobacco Use  . Smoking status: Never Smoker  . Smokeless tobacco: Never Used  Substance Use Topics  . Alcohol use: No  . Drug use: Yes    Types: Marijuana     Allergies   Peanut oil and Other   Review of Systems Review of Systems  Constitutional: Negative for appetite change, chills and fever.  HENT: Negative for ear pain, rhinorrhea, sneezing and sore throat.   Eyes: Negative for photophobia and visual disturbance.  Respiratory: Negative for cough, chest tightness, shortness of breath and wheezing.   Cardiovascular: Negative for chest pain and palpitations.  Gastrointestinal: Positive for abdominal pain, diarrhea,  nausea and vomiting. Negative for blood in stool and constipation.  Genitourinary: Negative for dysuria, hematuria and urgency.  Musculoskeletal: Negative for myalgias.  Skin: Negative for rash.  Neurological: Negative for dizziness, weakness and light-headedness.     Physical Exam Updated Vital Signs BP 136/69   Pulse (!) 139   Temp 97.8 F (36.6 C) (Oral)   Resp (!) 27   Ht _0  (1.727 m)   Wt 74.8 kg   LMP 06/03/2018 (Exact Date) Comment: neg. preg test  SpO2 100%   BMI 25.09 kg/m   Physical Exam  Vitals signs and nursing note reviewed.  Constitutional:      General: She is not in acute distress.    Appearance: She is well-developed.  HENT:     Head: Normocephalic and atraumatic.     Nose: Nose normal.     Mouth/Throat:     Mouth: Mucous membranes are dry.  Eyes:     General: No scleral icterus.       Right eye: No discharge.        Left eye: No discharge.     Conjunctiva/sclera: Conjunctivae normal.  Neck:     Musculoskeletal: Normal range of motion and neck supple.  Cardiovascular:     Rate and Rhythm: Regular rhythm. Tachycardia present.     Heart sounds: Normal heart sounds. No murmur. No friction rub. No gallop.   Pulmonary:     Effort: Pulmonary effort is normal. Tachypnea present. No respiratory distress.     Breath sounds: Normal breath sounds.  Abdominal:     General: Bowel sounds are normal. There is no distension.     Palpations: Abdomen is soft.     Tenderness: There is abdominal tenderness (generalized). There is no guarding.  Musculoskeletal: Normal range of motion.     Right lower leg: No edema.     Left lower leg: No edema.  Skin:    General: Skin is warm and dry.     Findings: No rash.  Neurological:     Mental Status: She is alert.     Motor: No abnormal muscle tone.     Coordination: Coordination normal.      ED Treatments / Results  Labs (all labs ordered are listed, but only abnormal results are displayed) Labs Reviewed  COMPREHENSIVE METABOLIC PANEL - Abnormal; Notable for the following components:      Result Value   Sodium 133 (*)    Potassium 5.4 (*)    CO2 <7 (*)    Glucose, Bld 594 (*)    BUN 23 (*)    Creatinine, Ser 1.78 (*)    Total Protein 8.9 (*)    AST 9 (*)    Alkaline Phosphatase 129 (*)    Total Bilirubin 1.8 (*)    GFR calc non Af Amer 40 (*)    GFR calc Af Amer 46 (*)    All other components within normal limits  CBC WITH DIFFERENTIAL/PLATELET - Abnormal; Notable for the following components:   WBC 33.8 (*)     MCV 101.7 (*)    MCHC 29.5 (*)    Neutro Abs 30.3 (*)    Monocytes Absolute 2.0 (*)    Abs Immature Granulocytes 0.34 (*)    All other components within normal limits  URINALYSIS, ROUTINE W REFLEX MICROSCOPIC - Abnormal; Notable for the following components:   Color, Urine STRAW (*)    Glucose, UA >=500 (*)    Hgb urine dipstick SMALL (*)  Ketones, ur 80 (*)    Protein, ur 30 (*)    Leukocytes,Ua SMALL (*)    All other components within normal limits  CBG MONITORING, ED - Abnormal; Notable for the following components:   Glucose-Capillary 566 (*)    All other components within normal limits  I-STAT BETA HCG BLOOD, ED (MC, [facility], AP ONLY) - Abnormal; Notable for the following components:   I-stat hCG, quantitative 11.2 (*)    All other components within normal limits  CBG MONITORING, ED - Abnormal; Notable for the following components:   Glucose-Capillary 487 (*)    All other components within normal limits  POCT I-STAT EG7 - Abnormal; Notable for the following components:   pH, Ven 7.076 (*)    pCO2, Ven 15.0 (*)    pO2, Ven 73.0 (*)    Bicarbonate 4.4 (*)    TCO2 <5 (*)    Acid-base deficit 24.0 (*)    Sodium 131 (*)    Potassium 5.6 (*)    Calcium, Ion 1.13 (*)    All other components within normal limits  CBG MONITORING, ED - Abnormal; Notable for the following components:   Glucose-Capillary 522 (*)    All other components within normal limits  CULTURE, BLOOD (ROUTINE X 2)  CULTURE, BLOOD (ROUTINE X 2)  SARS CORONAVIRUS 2 (HOSPITAL ORDER, [facility])  URINE CULTURE  LACTIC ACID, PLASMA  I-STAT VENOUS BLOOD GAS, ED  CBG MONITORING, ED    EKG EKG Interpretation  Date/Time:  Friday Jun 16 2018 08:10:01 EDT Ventricular Rate:  129 PR Interval:    QRS Duration: 88 QT Interval:  334 QTC Calculation: 490 R Axis:   88 Text Interpretation:  Sinus tachycardia Probable left atrial enlargement Borderline prolonged QT interval Confirmed by Lennice Sites [id number]) on 06/16/2018 8:18:08 AM   Radiology Dg Chest Portable 1 View  Result Date: 06/16/2018 CLINICAL DATA:  Sob  weakdka EXAM: PORTABLE CHEST 1 VIEW COMPARISON:  09/04/2017 FINDINGS: Normal mediastinum and cardiac silhouette. Normal pulmonary vasculature. No evidence of effusion, infiltrate, or pneumothorax. No acute bony abnormality. IMPRESSION: Normal chest radiograph. Electronically Signed   By: Suzy Bouchard M.D.   On: 06/16/2018 08:41    Procedures .Critical Care Performed by: Delia Heady, PA-C Authorized by: Delia Heady, PA-C   Critical care provider statement:    Critical care time (minutes):  35   Critical care was necessary to treat or prevent imminent or life-threatening deterioration of the following conditions:  Metabolic crisis and dehydration   Critical care was time spent personally by me on the following activities:  Development of treatment plan with patient or surrogate, discussions with consultants, examination of patient, obtaining history from patient or surrogate, ordering and review of laboratory studies, re-evaluation of patient's condition, review of old charts and ordering and performing treatments and interventions   (including critical care time)  Medications Ordered in ED Medications  insulin regular, human (MYXREDLIN) 100 units/ 100 mL infusion (9.2 Units/hr Intravenous Rate/Dose Change 06/16/18 1036)  dextrose 5 %-0.45 % sodium chloride infusion (has no administration in time range)  0.9 %  sodium chloride infusion (has no administration in time range)  cefTRIAXone (ROCEPHIN) 1 g in sodium chloride 0.9 % 100 mL IVPB (has no administration in time range)  sodium chloride 0.9 % bolus 1,000 mL (0 mLs Intravenous Stopped 06/16/18 0926)  ondansetron (ZOFRAN) injection 4 mg (4 mg Intravenous Given 06/16/18 0835)  sodium chloride 0.9 % bolus 1,000 mL (0 mLs  Intravenous Stopped 06/16/18 0951)  metoCLOPramide (REGLAN) injection 5 mg (5 mg Intravenous  Given 06/16/18 0902)  0.9 %  sodium chloride infusion ( Intravenous New Bag/Given 06/16/18 0937)  metoCLOPramide (REGLAN) injection 5 mg (5 mg Intravenous Given 06/16/18 0926)     Initial Impression / Assessment and Plan / ED Course  I have reviewed the triage vital signs and the nursing notes.  Pertinent labs & imaging results that were available during my care of the patient were reviewed by me and considered in my medical decision making (see chart for details).  Clinical Course as of Jun 16 1115  Fri Jun 16, 2018  0813 Glucose-Capillary(!!): 566 [HK]  0840 WBC(!): 33.8 [HK]  0859 pH, Ven(!!): 7.076 [HK]  0923 Potassium(!): 5.4 [HK]  0923 CO2(!): <7 [HK]  0924 Anion gap: NOT CALCULATED [HK]  1011 Denies possibility of pregnancy, hcg has been 7 in the past, unknown significance.  I-stat hCG, quantitative(!): 11.2 [HK]    Clinical Course User Index [HK] Delia Heady, PA-C       Mandy Patel was evaluated in Emergency Department on 06/16/18  for the symptoms described in the history of present illness. He/she was evaluated in the context of the global COVID-19 pandemic, which necessitated consideration that the patient might be at risk for infection with the SARS-CoV-2 virus that causes COVID-19. Institutional protocols and algorithms that pertain to the evaluation of patients at risk for COVID-19 are in a state of rapid change based on information released by regulatory bodies including the CDC and federal and state organizations. These policies and algorithms were followed during the patient's care in the ED.  23 year old female presents to ED for nausea, vomiting, diarrhea, abdominal pain and hyperglycemia.  She has not taken her medications for the past 2 days.  She has a history of noncompliance.  States that symptoms worsened this morning which prompted her visit to the ED. On exam she is tachycardic and tachypnec with dry MM. She denies any fever, cough or SOB, sick contacts.   Initial CBG is 566.  Plan for lab work, give fluid bolus and reassess.  10:08 AM Patient in DKA with CBG in 500s, AKI Ct 2.78, K 5.4, high AG, ph 7.076 on vbg. She has history of same. Leukocytosis at 33 is higher than priors but can be reactive to her symptoms, appears that her DKA in 2/2 her noncompliance; consider UTI/pyelonephritis as patient has history of same. UA pending, but patient will need admission for further workup and management. Started on maintenance fluids, insulin. She continues to have normal mentation with no focal abdominal TTP. Vomiting improved with antiemetics.  10:27 AM Consulted critical care (Dr. Valeta Harms) who will evaluate patient.   11:17 AM CCM has placed recommendations. Please see their note for further detail. They ask that we admit to medicine service.    Final Clinical Impressions(s) / ED Diagnoses   Final diagnoses:  Diabetic ketoacidosis without coma associated with type 1 diabetes mellitus [facility]    ED Discharge Orders    None      Portions of this note were generated with Dragon dictation software. Dictation errors may occur despite best attempts at proofreading.    Delia Heady, PA-C 06/16/18 1122    Lennice Sites, DO 06/16/18 1400

## 2018-06-16 NOTE — TOC Initial Note (Signed)
Transition of Care [facility] - Initial/Assessment Note    Patient Details  Name: Mandy Patel MRN: [id number] Date of Birth: [birth year]  Transition of Care [facility]) CM/SW Contact:    Oletta Cohn, RN Phone Number: 06/16/2018, 1:11 PM  Clinical Narrative:                 TOC consulted regarding uninsured pt with comorbidity requiring careful follow-up and resources.     Expected Discharge Plan: Home/Self Care Barriers to Discharge: Continued Medical Work up, Inadequate or no insurance   Patient Goals and CMS Choice Patient states their goals for this hospitalization and ongoing recovery are:: Go back home      Expected Discharge Plan and Services Expected Discharge Plan: Home/Self Care In-house Referral: Financial Counselor Discharge Planning Services: CM Consult, Medication Assistance, Indigent Health Clinic   Living arrangements for the past 2 months: Single Family Home                                      Prior Living Arrangements/Services Living arrangements for the past 2 months: Single Family Home Lives with:: Parents Patient language and need for interpreter reviewed:: Yes Do you feel safe going back to the place where you live?: Yes      Need for Family Participation in Patient Care: No (Comment) Care giver support system in place?: Yes (comment)      Activities of Daily Living      Permission Sought/Granted Permission sought to share information with : Case Manager, Family Supports Permission granted to share information with : Yes, Verbal Permission Granted              Emotional Assessment Appearance:: Appears stated age Attitude/Demeanor/Rapport: Engaged Affect (typically observed): Appropriate, Accepting Orientation: : Oriented to Self, Oriented to Place, Oriented to  Time, Oriented to Situation Alcohol / Substance Use: Never Used Psych Involvement: No (comment)  Admission diagnosis:  hyperglycemia Patient Active Problem List   Diagnosis Date Noted  . DKA, type 1 ([facility]) 06/16/2018  . Sepsis ([facility]) 09/04/2017  . Type 1 diabetes mellitus with hyperglycemia ([facility]) 03/15/2017  . Acute pyelonephritis 03/15/2017  . Acute right flank pain 03/15/2017  . Influenza A 03/06/2017  . Vomiting 03/06/2017  . DKA (diabetic ketoacidoses) ([facility]) 06/12/2015  . Type I (juvenile type) diabetes mellitus without mention of complication, uncontrolled 04/26/2011  . Preventative health care 12/01/2010  . Obesity 06/19/2010  . Goiter, unspecified 06/19/2010  . HYPERCHOLESTEROLEMIA 04/25/2008   PCP:  Patient, No Pcp Per Pharmacy:   [id number] Judithann Sheen, [place] - [address] [place] [address] Phone: [phone] Fax: [phone]     Social Determinants of Health (SDOH) Interventions    Readmission Risk Interventions No flowsheet data found.

## 2018-06-16 NOTE — ED Notes (Signed)
Pt actively vomiting in room. PA notified and to order antiemetic.

## 2018-06-17 DIAGNOSIS — E876 Hypokalemia: Secondary | ICD-10-CM

## 2018-06-17 LAB — BASIC METABOLIC PANEL
Anion gap: 10 (ref 5–15)
Anion gap: 13 (ref 5–15)
Anion gap: 9 (ref 5–15)
BUN: 6 mg/dL (ref 6–20)
BUN: <5 mg/dL — ABNORMAL LOW (ref 6–20)
BUN: <5 mg/dL — ABNORMAL LOW (ref 6–20)
CO2: 20 mmol/L — ABNORMAL LOW (ref 22–32)
CO2: 20 mmol/L — ABNORMAL LOW (ref 22–32)
CO2: 21 mmol/L — ABNORMAL LOW (ref 22–32)
Calcium: 7.9 mg/dL — ABNORMAL LOW (ref 8.9–10.3)
Calcium: 7.8 mg/dL — ABNORMAL LOW (ref 8.9–10.3)
Calcium: 7.9 mg/dL — ABNORMAL LOW (ref 8.9–10.3)
Chloride: 102 mmol/L (ref 98–111)
Chloride: 98 mmol/L (ref 98–111)
Chloride: 103 mmol/L (ref 98–111)
Creatinine, Ser: 0.57 mg/dL (ref 0.44–1.00)
Creatinine, Ser: 0.74 mg/dL (ref 0.44–1.00)
Creatinine, Ser: 0.55 mg/dL (ref 0.44–1.00)
GFR calc Af Amer: >60 mL/min (ref >60)
GFR calc Af Amer: >60 mL/min (ref >60)
GFR calc Af Amer: >60 mL/min (ref >60)
GFR calc non Af Amer: >60 mL/min (ref >60)
GFR calc non Af Amer: >60 mL/min (ref >60)
GFR calc non Af Amer: >60 mL/min (ref >60)
Glucose, Bld: 168 mg/dL — ABNORMAL HIGH (ref 70–99)
Glucose, Bld: 213 mg/dL — ABNORMAL HIGH (ref 70–99)
Glucose, Bld: 192 mg/dL — ABNORMAL HIGH (ref 70–99)
Potassium: 2.7 mmol/L — CL (ref 3.5–5.1)
Potassium: 2.9 mmol/L — ABNORMAL LOW (ref 3.5–5.1)
Potassium: 3.1 mmol/L — ABNORMAL LOW (ref 3.5–5.1)
Sodium: 132 mmol/L — ABNORMAL LOW (ref 135–145)
Sodium: 131 mmol/L — ABNORMAL LOW (ref 135–145)
Sodium: 133 mmol/L — ABNORMAL LOW (ref 135–145)

## 2018-06-17 LAB — GLUCOSE, CAPILLARY
Glucose-Capillary: 119 mg/dL — ABNORMAL HIGH (ref 70–99)
Glucose-Capillary: 119 mg/dL — ABNORMAL HIGH (ref 70–99)
Glucose-Capillary: 123 mg/dL — ABNORMAL HIGH (ref 70–99)
Glucose-Capillary: 128 mg/dL — ABNORMAL HIGH (ref 70–99)
Glucose-Capillary: 129 mg/dL — ABNORMAL HIGH (ref 70–99)
Glucose-Capillary: 134 mg/dL — ABNORMAL HIGH (ref 70–99)
Glucose-Capillary: 136 mg/dL — ABNORMAL HIGH (ref 70–99)
Glucose-Capillary: 140 mg/dL — ABNORMAL HIGH (ref 70–99)
Glucose-Capillary: 140 mg/dL — ABNORMAL HIGH (ref 70–99)
Glucose-Capillary: 148 mg/dL — ABNORMAL HIGH (ref 70–99)
Glucose-Capillary: 155 mg/dL — ABNORMAL HIGH (ref 70–99)
Glucose-Capillary: 158 mg/dL — ABNORMAL HIGH (ref 70–99)
Glucose-Capillary: 163 mg/dL — ABNORMAL HIGH (ref 70–99)
Glucose-Capillary: 163 mg/dL — ABNORMAL HIGH (ref 70–99)
Glucose-Capillary: 174 mg/dL — ABNORMAL HIGH (ref 70–99)
Glucose-Capillary: 176 mg/dL — ABNORMAL HIGH (ref 70–99)
Glucose-Capillary: 181 mg/dL — ABNORMAL HIGH (ref 70–99)
Glucose-Capillary: 191 mg/dL — ABNORMAL HIGH (ref 70–99)
Glucose-Capillary: 211 mg/dL — ABNORMAL HIGH (ref 70–99)
Glucose-Capillary: 248 mg/dL — ABNORMAL HIGH (ref 70–99)

## 2018-06-17 LAB — COMPREHENSIVE METABOLIC PANEL
ALT: 8 U/L (ref 0–44)
AST: 7 U/L — ABNORMAL LOW (ref 15–41)
Albumin: 2.8 g/dL — ABNORMAL LOW (ref 3.5–5.0)
Alkaline Phosphatase: 84 U/L (ref 38–126)
Anion gap: 9 (ref 5–15)
BUN: 10 mg/dL (ref 6–20)
CO2: 19 mmol/L — ABNORMAL LOW (ref 22–32)
Calcium: 8.1 mg/dL — ABNORMAL LOW (ref 8.9–10.3)
Chloride: 105 mmol/L (ref 98–111)
Creatinine, Ser: 0.61 mg/dL (ref 0.44–1.00)
GFR calc Af Amer: >60 mL/min (ref >60)
GFR calc non Af Amer: >60 mL/min (ref >60)
Glucose, Bld: 129 mg/dL — ABNORMAL HIGH (ref 70–99)
Potassium: 3.1 mmol/L — ABNORMAL LOW (ref 3.5–5.1)
Sodium: 133 mmol/L — ABNORMAL LOW (ref 135–145)
Total Bilirubin: 0.6 mg/dL (ref 0.3–1.2)
Total Protein: 6.5 g/dL (ref 6.5–8.1)

## 2018-06-17 LAB — CBC
HCT: 28.6 % — ABNORMAL LOW (ref 36.0–46.0)
Hemoglobin: 9.6 g/dL — ABNORMAL LOW (ref 12.0–15.0)
MCH: 30.2 pg (ref 26.0–34.0)
MCHC: 33.6 g/dL (ref 30.0–36.0)
MCV: 89.9 fL (ref 80.0–100.0)
Platelets: 255 10*3/uL (ref 150–400)
RBC: 3.18 MIL/uL — ABNORMAL LOW (ref 3.87–5.11)
RDW: 13.3 % (ref 11.5–15.5)
WBC: 21.5 10*3/uL — ABNORMAL HIGH (ref 4.0–10.5)
nRBC: 0 % (ref 0.0–0.2)

## 2018-06-17 LAB — BLOOD CULTURE ID PANEL (REFLEXED)

## 2018-06-17 LAB — URINE CULTURE: Culture: <10000 — AB

## 2018-06-17 LAB — MAGNESIUM: Magnesium: 1.8 mg/dL (ref 1.7–2.4)

## 2018-06-17 MED ORDER — POTASSIUM CHLORIDE CRYS ER 20 MEQ PO TBCR
40.0000 meq | EXTENDED_RELEASE_TABLET | Freq: Once | ORAL | Status: AC
  Administered 2018-06-17: 40 meq via ORAL
  Filled 2018-06-17: qty 2

## 2018-06-17 MED ORDER — POTASSIUM CHLORIDE 10 MEQ/100ML IV SOLN
10.0000 meq | INTRAVENOUS | Status: AC
  Administered 2018-06-17 (×4): 10 meq via INTRAVENOUS
  Filled 2018-06-17 (×2): qty 100

## 2018-06-17 MED ORDER — METOCLOPRAMIDE HCL 5 MG/ML IJ SOLN
10.0000 mg | Freq: Three times a day (TID) | INTRAMUSCULAR | Status: AC
  Administered 2018-06-17 – 2018-06-18 (×3): 10 mg via INTRAVENOUS
  Filled 2018-06-17 (×3): qty 2

## 2018-06-17 MED ORDER — SODIUM CHLORIDE 0.9 % IV SOLN
2.0000 g | INTRAVENOUS | Status: DC
  Administered 2018-06-17 – 2018-06-19 (×3): 2 g via INTRAVENOUS
  Filled 2018-06-17 (×3): qty 20

## 2018-06-17 MED ORDER — ONDANSETRON HCL 4 MG/2ML IJ SOLN: 4.0000 mg | Freq: Four times a day (QID) | INTRAMUSCULAR | Status: DC | PRN

## 2018-06-17 MED ORDER — MAGNESIUM SULFATE 2 GM/50ML IV SOLN
2.0000 g | Freq: Once | INTRAVENOUS | Status: AC
  Administered 2018-06-17: 2 g via INTRAVENOUS
  Filled 2018-06-17: qty 50

## 2018-06-17 MED ORDER — INSULIN DETEMIR 100 UNIT/ML ~~LOC~~ SOLN
15.0000 [IU] | Freq: Every day | SUBCUTANEOUS | Status: DC
  Administered 2018-06-17: 15 [IU] via SUBCUTANEOUS
  Filled 2018-06-17 (×2): qty 0.15

## 2018-06-17 NOTE — Progress Notes (Signed)
PHARMACY - PHYSICIAN COMMUNICATION CRITICAL VALUE ALERT - BLOOD CULTURE IDENTIFICATION (BCID)  Mandy Patel is an 23 y.o. female who presented to [facility] on 06/16/2018 with a chief complaint of n/v  Assessment:  1/4 BC with E coli, likely urinary source  Name of physician (or Provider) Contacted: K Schorr  Current antibiotics: rocephin 1gm q24  Changes to prescribed antibiotics recommended:  Change to rocephin 2gm IV q24  Results for orders placed or performed during the hospital encounter of 03/25/18  Blood Culture ID Panel (Reflexed) (Collected: 03/25/2018  1:35 AM)  Result Value Ref Range   Enterococcus species NOT DETECTED NOT DETECTED   Listeria monocytogenes NOT DETECTED NOT DETECTED   Staphylococcus species NOT DETECTED NOT DETECTED   Staphylococcus aureus (BCID) NOT DETECTED NOT DETECTED   Streptococcus species NOT DETECTED NOT DETECTED   Streptococcus agalactiae NOT DETECTED NOT DETECTED   Streptococcus pneumoniae NOT DETECTED NOT DETECTED   Streptococcus pyogenes NOT DETECTED NOT DETECTED   Acinetobacter baumannii NOT DETECTED NOT DETECTED   Enterobacteriaceae species DETECTED (A) NOT DETECTED   Enterobacter cloacae complex NOT DETECTED NOT DETECTED   Escherichia coli DETECTED (A) NOT DETECTED   Klebsiella oxytoca NOT DETECTED NOT DETECTED   Klebsiella pneumoniae NOT DETECTED NOT DETECTED   Proteus species NOT DETECTED NOT DETECTED   Serratia marcescens NOT DETECTED NOT DETECTED   Carbapenem resistance NOT DETECTED NOT DETECTED   Haemophilus influenzae NOT DETECTED NOT DETECTED   Neisseria meningitidis NOT DETECTED NOT DETECTED   Pseudomonas aeruginosa NOT DETECTED NOT DETECTED   Candida albicans NOT DETECTED NOT DETECTED   Candida glabrata NOT DETECTED NOT DETECTED   Candida krusei NOT DETECTED NOT DETECTED   Candida parapsilosis NOT DETECTED NOT DETECTED   Candida tropicalis NOT DETECTED NOT DETECTED    [name] 06/17/2018  1:01 AM

## 2018-06-17 NOTE — Progress Notes (Addendum)
PROGRESS NOTE        PATIENT DETAILS Name: Mandy Patel Age: 23 y.o. Sex: female Date of Birth: 04-Oct-1995 Admit Date: 06/16/2018 Admitting Physician Clydie Braun, MD WUJ:[id number], No Pcp Per  Brief Narrative: Patient is a 23 y.o. female with history of DM-1-noncompliant with insulin (last A1c on 4/30 was 13.0)-presented with DKA, AKI, and E. coli bacteremia.  See below for further details  Subjective: Feels better-still nauseous but no vomiting this morning.  Denies any abdominal pain-no RUQ pain.  Assessment/Plan: DKA: Improved-anion gap is closed-she still is pretty nauseous-we will continue IV insulin for a few more hours-start clear liquids and see how she does.  If she is able to tolerate some oral intake, we will discontinue insulin infusion and transition to SQ insulin soon.  Hypokalemia: Replete and recheck.  AKI: Resolved-was likely hemodynamically mediated in the setting of DKA.  Systemic inflammatory response syndrome: Secondary to DKA and gram-negative bacteremia.  Slowly improving with IV Rocephin.  E. coli bacteremia: Suspect secondary to urinary source-recently had a UTI in late April.  Abdominal exam is completely benign.  Recent CT scan in April 2020-did not show any hepatobiliary abnormalities.  Continue Rocephin-await final culture/sensitivity results.  COVID 19 screen: negative  DVT Prophylaxis: Prophylactic Lovenox   Code Status: Full code  Family Communication: None at bedside  Disposition Plan: Remain inpatient  Antimicrobial agents: Anti-infectives (From admission, onward)   Start     Dose/Rate Route Frequency Ordered Stop   06/17/18 1000  cefTRIAXone (ROCEPHIN) 1 g in sodium chloride 0.9 % 100 mL IVPB  Status:  Discontinued     1 g 200 mL/hr over 30 Minutes Intravenous Every 24 hours 06/16/18 1231 06/17/18 0100   06/17/18 1000  cefTRIAXone (ROCEPHIN) 2 g in sodium chloride 0.9 % 100 mL IVPB     2 g 200 mL/hr  over 30 Minutes Intravenous Every 24 hours 06/17/18 0100     06/16/18 1130  cefTRIAXone (ROCEPHIN) 1 g in sodium chloride 0.9 % 100 mL IVPB     1 g 200 mL/hr over 30 Minutes Intravenous  Once 06/16/18 1116 06/16/18 1152      Procedures: None  CONSULTS:  None  Time spent: 25 minutes-Greater than 50% of this time was spent in counseling, explanation of diagnosis, planning of further management, and coordination of care.  MEDICATIONS: Scheduled Meds: . enoxaparin (LOVENOX) injection  40 mg Subcutaneous Q24H  . pantoprazole  40 mg Oral Daily   Continuous Infusions: . sodium chloride Stopped (06/16/18 1459)  . cefTRIAXone (ROCEPHIN)  IV 2 g (06/17/18 0917)  . dextrose 5 % and 0.45% NaCl 125 mL/hr at 06/16/18 1502  . insulin 4.8 Units/hr (06/17/18 0947)  . potassium chloride    .  sodium bicarbonate (isotonic) infusion in sterile water 100 mL/hr at 06/16/18 1354   PRN Meds:.acetaminophen **OR** acetaminophen, metoCLOPramide (REGLAN) injection, morphine injection   PHYSICAL EXAM: Vital signs: Vitals:   06/16/18 2014 06/17/18 0007 06/17/18 0344 06/17/18 0857  BP: 104/61 111/66  118/68  Pulse: (!) 116 (!) 112  (!) 113  Resp: 18 20  (!) 22  Temp: 98.5 F (36.9 C) 98.2 F (36.8 C) 98.6 F (37 C) 98.2 F (36.8 C)  TempSrc: Oral Axillary Oral Oral  SpO2: 100% 100%  99%  Weight:      Height:  Filed Weights   06/16/18 0759  Weight: 74.8 kg   Body mass index is 25.09 kg/m.   General appearance :Awake, alert, not in any distress.  HEENT: Atraumatic and Normocephalic Neck: supple, no JVD. Resp:Good air entry bilaterally, no added sounds  CVS: S1 S2 regular, no murmurs.  GI: Bowel sounds present, Non tender and not distended with no gaurding, rigidity or rebound.No organomegaly Extremities: B/L Lower Ext shows no edema, both legs are warm to touch Neurology:  speech clear,Non focal, sensation is grossly intact. Psychiatric: Normal judgment and insight. Alert and  oriented x 3. Normal mood. Musculoskeletal:No digital cyanosis Skin:No Rash, warm and dry Wounds:N/A  I have personally reviewed following labs and imaging studies  LABORATORY DATA: CBC: Recent Labs  Lab 06/16/18 0840 06/16/18 0853 06/17/18 0010  WBC 33.8*  --  21.5*  NEUTROABS 30.3*  --   --   HGB 12.2 13.9 9.6*  HCT 41.3 41.0 28.6*  MCV 101.7*  --  89.9  PLT 369  --  255    Basic Metabolic Panel: Recent Labs  Lab 06/16/18 1301 06/16/18 1544 06/16/18 2036 06/17/18 0010 06/17/18 0825  NA 139 141 135 133* 132*  K 5.5* 4.2 3.8 3.1* 2.7*  CL 113* 115* 107 105 102  CO2 9* 10* 16* 19* 20*  GLUCOSE 346* 199* 227* 129* 168*  BUN CREATININE 1.38* 1.16* 0.83 0.61 0.57  CALCIUM 8.7* 8.8* 8.5* 8.1* 7.9*    GFR: Estimated Creatinine Clearance: 111.3 mL/min (by C-G formula based on SCr of 0.57 mg/dL).  Liver Function Tests: Recent Labs  Lab 06/16/18 0840 06/17/18 0010  AST 9* 7*  ALT 12 8  ALKPHOS 129* 84  BILITOT 1.8* 0.6  PROT 8.9* 6.5  ALBUMIN 3.9 2.8*   No results for input(s): LIPASE, AMYLASE in the last 168 hours. No results for input(s): AMMONIA in the last 168 hours.  Coagulation Profile: No results for input(s): INR, PROTIME in the last 168 hours.  Cardiac Enzymes: No results for input(s): CKTOTAL, CKMB, CKMBINDEX, TROPONINI in the last 168 hours.  BNP (last 3 results) No results for input(s): PROBNP in the last 8760 hours.  HbA1C: No results for input(s): HGBA1C in the last 72 hours.  CBG: Recent Labs  Lab 06/17/18 0506 06/17/18 0624 06/17/18 0735 06/17/18 0833 06/17/18 0932  GLUCAP 140* 191* 174* 181* 163*    Lipid Profile: No results for input(s): CHOL, HDL, LDLCALC, TRIG, CHOLHDL, LDLDIRECT in the last 72 hours.  Thyroid Function Tests: No results for input(s): TSH, T4TOTAL, FREET4, T3FREE, THYROIDAB in the last 72 hours.  Anemia Panel: No results for input(s): VITAMINB12, FOLATE, FERRITIN, TIBC, IRON, RETICCTPCT  in the last 72 hours.  Urine analysis:    Component Value Date/Time   COLORURINE STRAW (A) 06/16/2018 0800   APPEARANCEUR CLEAR 06/16/2018 0800   LABSPEC 1.016 06/16/2018 0800   PHURINE 5.0 06/16/2018 0800   GLUCOSEU >=500 (A) 06/16/2018 0800   GLUCOSEU >=1000 12/01/2010 1051   HGBUR SMALL (A) 06/16/2018 0800   BILIRUBINUR NEGATIVE 06/16/2018 0800   KETONESUR 80 (A) 06/16/2018 0800   PROTEINUR 30 (A) 06/16/2018 0800   UROBILINOGEN 0.2 12/01/2010 1051   NITRITE NEGATIVE 06/16/2018 0800   LEUKOCYTESUR SMALL (A) 06/16/2018 0800    Sepsis Labs: Lactic Acid, Venous    Component Value Date/Time   LATICACIDVEN 2.2 (HH) 06/16/2018 1301    MICROBIOLOGY: Recent Results (from the past 240 hour(s))  Blood culture (routine x 2)  Status: None (Preliminary result)   Collection Time: 06/16/18  8:30 AM  Result Value Ref Range Status   Specimen Description BLOOD RIGHT WRIST  Final   Special Requests   Final    BOTTLES DRAWN AEROBIC AND ANAEROBIC Blood Culture adequate volume   Culture   Final    NO GROWTH 1 DAY Performed at [facility], [address] [place], [place] [address]    Report Status PENDING  Incomplete  Blood culture (routine x 2)     Status: Abnormal (Preliminary result)   Collection Time: 06/16/18  8:35 AM  Result Value Ref Range Status   Specimen Description BLOOD LEFT ANTECUBITAL  Final   Special Requests   Final    BOTTLES DRAWN AEROBIC AND ANAEROBIC Blood Culture results may not be optimal due to an inadequate volume of blood received in culture bottles   Culture  Setup Time   Final    AEROBIC BOTTLE ONLY GRAM NEGATIVE RODS CRITICAL RESULT CALLED TO, READ BACK BY AND VERIFIED WITH: L SEAY PHARMD 06/17/18 0056 JDW    Culture (A)  Final    ESCHERICHIA COLI SUSCEPTIBILITIES TO FOLLOW Performed at [facility], [address] [place], [place] [address]    Report Status PENDING  Incomplete  Blood Culture ID Panel (Reflexed)     Status: Abnormal    Collection Time: 06/16/18  8:35 AM  Result Value Ref Range Status   Enterococcus species NOT DETECTED NOT DETECTED Final   Listeria monocytogenes NOT DETECTED NOT DETECTED Final   Staphylococcus species NOT DETECTED NOT DETECTED Final   Staphylococcus aureus (BCID) NOT DETECTED NOT DETECTED Final   Streptococcus species NOT DETECTED NOT DETECTED Final   Streptococcus agalactiae NOT DETECTED NOT DETECTED Final   Streptococcus pneumoniae NOT DETECTED NOT DETECTED Final   Streptococcus pyogenes NOT DETECTED NOT DETECTED Final   Acinetobacter baumannii NOT DETECTED NOT DETECTED Final   Enterobacteriaceae species DETECTED (A) NOT DETECTED Final    Comment: Enterobacteriaceae represent a large family of gram-negative bacteria, not a single organism. CRITICAL RESULT CALLED TO, READ BACK BY AND VERIFIED WITH: L SEAY PHARMD 06/17/18 0056 JDW    Enterobacter cloacae complex NOT DETECTED NOT DETECTED Final   Escherichia coli DETECTED (A) NOT DETECTED Final    Comment: CRITICAL RESULT CALLED TO, READ BACK BY AND VERIFIED WITH: L SEAY PHARMD 06/17/18 0056 JDW    Klebsiella oxytoca NOT DETECTED NOT DETECTED Final   Klebsiella pneumoniae NOT DETECTED NOT DETECTED Final   Proteus species NOT DETECTED NOT DETECTED Final   Serratia marcescens NOT DETECTED NOT DETECTED Final   Carbapenem resistance NOT DETECTED NOT DETECTED Final   Haemophilus influenzae NOT DETECTED NOT DETECTED Final   Neisseria meningitidis NOT DETECTED NOT DETECTED Final   Pseudomonas aeruginosa NOT DETECTED NOT DETECTED Final   Candida albicans NOT DETECTED NOT DETECTED Final   Candida glabrata NOT DETECTED NOT DETECTED Final   Candida krusei NOT DETECTED NOT DETECTED Final   Candida parapsilosis NOT DETECTED NOT DETECTED Final   Candida tropicalis NOT DETECTED NOT DETECTED Final    Comment: Performed at [facility], [address] [place], [place] [address]  SARS Coronavirus 2 (CEPHEID- Performed in [facility] hospital  lab), Hosp Order     Status: None   Collection Time: 06/16/18  9:09 AM  Result Value Ref Range Status   SARS Coronavirus 2 NEGATIVE NEGATIVE Final    Comment: (NOTE) If result is NEGATIVE SARS-CoV-2 target nucleic acids  are NOT DETECTED. The SARS-CoV-2 RNA is generally detectable in upper and lower  respiratory specimens during the acute phase of infection. The lowest  concentration of SARS-CoV-2 viral copies this assay can detect is 250  copies / mL. A negative result does not preclude SARS-CoV-2 infection  and should not be used as the sole basis for treatment or other  patient management decisions.  A negative result may occur with  improper specimen collection / handling, submission of specimen other  than nasopharyngeal swab, presence of viral mutation(s) within the  areas targeted by this assay, and inadequate number of viral copies  (<250 copies / mL). A negative result must be combined with clinical  observations, patient history, and epidemiological information. If result is POSITIVE SARS-CoV-2 target nucleic acids are DETECTED. The SARS-CoV-2 RNA is generally detectable in upper and lower  respiratory specimens dur ing the acute phase of infection.  Positive  results are indicative of active infection with SARS-CoV-2.  Clinical  correlation with patient history and other diagnostic information is  necessary to determine patient infection status.  Positive results do  not rule out bacterial infection or co-infection with other viruses. If result is PRESUMPTIVE POSTIVE SARS-CoV-2 nucleic acids MAY BE PRESENT.   A presumptive positive result was obtained on the submitted specimen  and confirmed on repeat testing.  While 2019 novel coronavirus  (SARS-CoV-2) nucleic acids may be present in the submitted sample  additional confirmatory testing may be necessary for epidemiological  and / or clinical management purposes  to differentiate between  SARS-CoV-2 and other Sarbecovirus  currently known to infect humans.  If clinically indicated additional testing with an alternate test  methodology [phone]) is advised. The SARS-CoV-2 RNA is generally  detectable in upper and lower respiratory sp ecimens during the acute  phase of infection. The expected result is Negative. Fact Sheet for Patients:  BoilerBrush.com.cy Fact Sheet for Healthcare Providers: https://pope.com/ This test is not yet approved or cleared by the [place] FDA and has been authorized for detection and/or diagnosis of SARS-CoV-2 by FDA under an Emergency Use Authorization (EUA).  This EUA will remain in effect (meaning this test can be used) for the duration of the COVID-19 declaration under Section 564(b)(1) of the Act, 21 U.S.C. section 360bbb-3(b)(1), unless the authorization is terminated or revoked sooner. Performed at [facility], [address], [place], [place] [address]     RADIOLOGY STUDIES/RESULTS: US Renal  Result Date: 06/16/2018 CLINICAL DATA:  Flank pain EXAM: RENAL / URINARY TRACT ULTRASOUND COMPLETE COMPARISON:  05/31/2018 FINDINGS: Right Kidney: Renal measurements: 13.5 x 5.1 x 6.0 cm = volume: 217 mL. Mild hydronephrosis is noted similar to that seen on prior CT examination. Left Kidney: Renal measurements: 12.1 x 5.9 x 6.0 cm = volume: 226 mL. Mild hydronephrosis is noted similar to that seen on prior CT examination. Bladder: Appears normal for degree of bladder distention. IMPRESSION: Mild hydronephrosis bilaterally stable from the previous exam. No other focal abnormality is noted. Electronically Signed   By: Alcide Clever M.D.   On: 06/16/2018 12:21   Dg Chest Portable 1 View  Result Date: 06/16/2018 CLINICAL DATA:  Sob  weakdka EXAM: PORTABLE CHEST 1 VIEW COMPARISON:  09/04/2017 FINDINGS: Normal mediastinum and cardiac silhouette. Normal pulmonary vasculature. No evidence of effusion, infiltrate, or pneumothorax. No  acute bony abnormality. IMPRESSION: Normal chest radiograph. Electronically Signed   By: Genevive Bi M.D.   On: 06/16/2018 08:41   Ct Renal Stone Study  Result  Date: 05/31/2018 CLINICAL DATA:  Type 1 diabetic. Abdominal pain. Assess for renal stone disease. EXAM: CT ABDOMEN AND PELVIS WITHOUT CONTRAST TECHNIQUE: Multidetector CT imaging of the abdomen and pelvis was performed following the standard protocol without IV contrast. COMPARISON:  03/25/2018 FINDINGS: Lower chest: Normal Hepatobiliary: Normal without contrast. Pancreas: Normal Spleen: Normal Adrenals/Urinary Tract: Adrenal glands are normal. The kidneys are enlarged and show increased prominence of the renal collecting systems. Findings could go along with acute nephritis, diabetic glomerular nephropathy or low-grade obstruction. There is no evidence of stone disease. Bladder appears normal. No stone in the bladder. Stomach/Bowel: Normal Vascular/Lymphatic: Normal Reproductive: Normal.  No pelvic mass. Other: Tiny amount of free fluid in the pelvic cul de sac, often seen in females of this age. Musculoskeletal: Negative IMPRESSION: Large kidneys with mild fullness of the renal collecting systems and ureters but no evidence of stone disease or obstructing lesion. Renal enlargement can be seen with diabetic glomerulonephropathy. However, the presence of the renal collecting system fullness raises possibility of urinary tract infection and acute nephritis. Electronically Signed   By: Paulina Fusi M.D.   On: 05/31/2018 21:58     LOS: 0 days   Jeoffrey Massed, MD  [facility]  If 7PM-7AM, please contact night-coverage  Please page via [email or web address]  Go to [email or web address] and use [facility] universal password to access. If you do not have the password, please contact the hospital operator.  Locate the [facility] provider you are looking for under [facility] and page to a number that you can be directly reached. If you still have  difficulty reaching the provider, please page the [facility] (Director on Call) for the Hospitalists listed on amion for assistance.  06/17/2018, 10:21 AM

## 2018-06-18 LAB — CULTURE, BLOOD (ROUTINE X 2)

## 2018-06-18 LAB — CBC
HCT: 26.4 % — ABNORMAL LOW (ref 36.0–46.0)
Hemoglobin: 8.7 g/dL — ABNORMAL LOW (ref 12.0–15.0)
MCH: 29.5 pg (ref 26.0–34.0)
MCHC: 33 g/dL (ref 30.0–36.0)
MCV: 89.5 fL (ref 80.0–100.0)
Platelets: 201 10*3/uL (ref 150–400)
RBC: 2.95 MIL/uL — ABNORMAL LOW (ref 3.87–5.11)
RDW: 13.2 % (ref 11.5–15.5)
WBC: 11.9 10*3/uL — ABNORMAL HIGH (ref 4.0–10.5)
nRBC: 0 % (ref 0.0–0.2)

## 2018-06-18 LAB — GLUCOSE, CAPILLARY
Glucose-Capillary: 119 mg/dL — ABNORMAL HIGH (ref 70–99)
Glucose-Capillary: 127 mg/dL — ABNORMAL HIGH (ref 70–99)
Glucose-Capillary: 130 mg/dL — ABNORMAL HIGH (ref 70–99)
Glucose-Capillary: 176 mg/dL — ABNORMAL HIGH (ref 70–99)
Glucose-Capillary: 185 mg/dL — ABNORMAL HIGH (ref 70–99)
Glucose-Capillary: 241 mg/dL — ABNORMAL HIGH (ref 70–99)
Glucose-Capillary: 248 mg/dL — ABNORMAL HIGH (ref 70–99)

## 2018-06-18 LAB — BASIC METABOLIC PANEL
Anion gap: 11 (ref 5–15)
BUN: <5 mg/dL — ABNORMAL LOW (ref 6–20)
CO2: 21 mmol/L — ABNORMAL LOW (ref 22–32)
Calcium: 8 mg/dL — ABNORMAL LOW (ref 8.9–10.3)
Chloride: 103 mmol/L (ref 98–111)
Creatinine, Ser: 0.52 mg/dL (ref 0.44–1.00)
GFR calc Af Amer: >60 mL/min (ref >60)
GFR calc non Af Amer: >60 mL/min (ref >60)
Glucose, Bld: 135 mg/dL — ABNORMAL HIGH (ref 70–99)
Potassium: 3.2 mmol/L — ABNORMAL LOW (ref 3.5–5.1)
Sodium: 135 mmol/L (ref 135–145)

## 2018-06-18 LAB — MAGNESIUM: Magnesium: 2.3 mg/dL (ref 1.7–2.4)

## 2018-06-18 MED ORDER — POTASSIUM CHLORIDE CRYS ER 20 MEQ PO TBCR
40.0000 meq | EXTENDED_RELEASE_TABLET | ORAL | Status: AC
  Administered 2018-06-18 (×2): 40 meq via ORAL
  Filled 2018-06-18 (×2): qty 2

## 2018-06-18 MED ORDER — INSULIN DETEMIR 100 UNIT/ML ~~LOC~~ SOLN
20.0000 [IU] | Freq: Every day | SUBCUTANEOUS | Status: DC
  Administered 2018-06-18: 20 [IU] via SUBCUTANEOUS
  Filled 2018-06-18 (×2): qty 0.2

## 2018-06-18 MED ORDER — INSULIN ASPART 100 UNIT/ML ~~LOC~~ SOLN
0.0000 [IU] | Freq: Three times a day (TID) | SUBCUTANEOUS | Status: DC
  Administered 2018-06-18 (×2): 5 [IU] via SUBCUTANEOUS
  Administered 2018-06-19: 2 [IU] via SUBCUTANEOUS

## 2018-06-18 NOTE — Plan of Care (Signed)
  Problem: Education: Goal: Knowledge of General Education information will improve Description Including pain rating scale, medication(s)/side effects and non-pharmacologic comfort measures Outcome: Progressing   Problem: Clinical Measurements: Goal: Will remain free from infection Outcome: Progressing Goal: Respiratory complications will improve Outcome: Progressing Goal: Cardiovascular complication will be avoided Outcome: Progressing   Problem: Coping: Goal: Level of anxiety will decrease Outcome: Progressing   Problem: Health Behavior/Discharge Planning: Goal: Ability to manage health-related needs will improve Outcome: Not Progressing

## 2018-06-18 NOTE — Progress Notes (Addendum)
PROGRESS NOTE        PATIENT DETAILS Name: Mandy Patel Age: 23 y.o. Sex: female Date of Birth: Aug 18, 1995 Admit Date: 06/16/2018 Admitting Physician Clydie Braun, MD [id number], No Pcp Per  Brief Narrative: Patient is a 23 y.o. female with history of DM-1-noncompliant with insulin (last A1c on 4/30 was 13.0)-presented with DKA, AKI, and E. coli bacteremia.  See below for further details  Subjective: Feels better-still nauseous but no vomiting this morning.  Denies any abdominal pain-no RUQ pain.  Assessment/Plan: DKA: Resolved-has been transitioned to subcutaneous insulin.  Suspect etiology to be noncompliance-apparently patient did not bring her insulin with her when she came to live with her boyfriend in [place].  DM-1: CBGs relatively stable overnight-increase Lantus to 20 units, continue SSI.  Follow and optimize.  Systemic inflammatory response syndrome secondary to E. coli bacteremia: Likely urinary source-awaiting sensitivity results.  Improved-sepsis pathophysiology has resolved-continue IV Rocephin-hopefully can be transitioned to oral antimicrobial agent when sensitivity results are obtained.Recent CT abdomen on April did not show any hepatobiliary abnormalities.  Abdominal exam is very benign.  AKI: Hemodynamically mediated in the setting of bacteremia and DKA.  Improved with supportive care.  Follow periodically.  Hypokalemia: Replete and recheck  Nausea with vomiting: Significantly improved-although this was likely secondary to DKA-may have a component of gastroparesis-stop Reglan, continue with Zofran.  Continue to slowly advance diet  Anemia: No evidence of blood loss-suspect that hemoglobin level on admission was artificially elevated given DKA/dehydration.  Drop in hemoglobin is from acute illness/bacteremia and IV fluids.  Repeat CBC tomorrow  COVID 19 screen: negative  DVT Prophylaxis: Prophylactic Lovenox   Code Status:  Full code  Family Communication: None at bedside  Disposition Plan: Remain inpatient-home unlikely 5/18  Antimicrobial agents: Anti-infectives (From admission, onward)   Start     Dose/Rate Route Frequency Ordered Stop   06/17/18 1000  cefTRIAXone (ROCEPHIN) 1 g in sodium chloride 0.9 % 100 mL IVPB  Status:  Discontinued     1 g 200 mL/hr over 30 Minutes Intravenous Every 24 hours 06/16/18 1231 06/17/18 0100   06/17/18 1000  cefTRIAXone (ROCEPHIN) 2 g in sodium chloride 0.9 % 100 mL IVPB     2 g 200 mL/hr over 30 Minutes Intravenous Every 24 hours 06/17/18 0100     06/16/18 1130  cefTRIAXone (ROCEPHIN) 1 g in sodium chloride 0.9 % 100 mL IVPB     1 g 200 mL/hr over 30 Minutes Intravenous  Once 06/16/18 1116 06/16/18 1152      Procedures: None  CONSULTS:  None  Time spent: 25 minutes-Greater than 50% of this time was spent in counseling, explanation of diagnosis, planning of further management, and coordination of care.  MEDICATIONS: Scheduled Meds: . enoxaparin (LOVENOX) injection  40 mg Subcutaneous Q24H  . insulin aspart  0-15 Units Subcutaneous TID WC  . insulin detemir  15 Units Subcutaneous QHS  . pantoprazole  40 mg Oral Daily   Continuous Infusions: . sodium chloride Stopped (06/16/18 1459)  . cefTRIAXone (ROCEPHIN)  IV 2 g (06/18/18 1053)  . dextrose 5 % and 0.45% NaCl 125 mL/hr at 06/18/18 0057   PRN Meds:.acetaminophen **OR** acetaminophen, morphine injection, ondansetron (ZOFRAN) IV   PHYSICAL EXAM: Vital signs: Vitals:   06/18/18 0100 06/18/18 0403 06/18/18 0500 06/18/18 0855  BP: 111/69  103/65 114/61  Pulse: (!) 101  (!)  101 (!) 108  Resp: (!) 22  (!) 25 19  Temp:  99.1 F (37.3 C)  98.6 F (37 C)  TempSrc:    Oral  SpO2: 99%  98% 99%  Weight:      Height:       Filed Weights   06/16/18 0759  Weight: 74.8 kg   Body mass index is 25.09 kg/m.   General appearance:Awake, alert, not in any distress.  Eyes:no scleral icterus. HEENT:  Atraumatic and Normocephalic Neck: supple, no JVD. Resp:Good air entry bilaterally,no rales or rhonchi CVS: S1 S2 regular, no murmurs.  GI: Bowel sounds present, Non tender and not distended with no gaurding, rigidity or rebound. Extremities: B/L Lower Ext shows no edema, both legs are warm to touch Neurology:  Non focal Psychiatric: Normal judgment and insight. Normal mood. Musculoskeletal:No digital cyanosis Skin:No Rash, warm and dry Wounds:N/A  I have personally reviewed following labs and imaging studies  LABORATORY DATA: CBC: Recent Labs  Lab 06/16/18 0840 06/16/18 0853 06/17/18 0010 06/18/18 0209  WBC 33.8*  --  21.5* 11.9*  NEUTROABS 30.3*  --   --   --   HGB 12.2 13.9 9.6* 8.7*  HCT 41.3 41.0 28.6* 26.4*  MCV 101.7*  --  89.9 89.5  PLT 369  --  255 201    Basic Metabolic Panel: Recent Labs  Lab 06/17/18 0010 06/17/18 0825 06/17/18 1252 06/17/18 1825 06/18/18 0209  NA 133* 132* 131* 133* 135  K 3.1* 2.7* 2.9* 3.1* 3.2*  CL 105 102 98 103 103  CO2 19* 20* 20* 21* 21*  GLUCOSE 129* 168* 213* 192* 135*  BUN 10 6 <5* <5* <5*  CREATININE 0.61 0.57 0.74 0.55 0.52  CALCIUM 8.1* 7.9* 7.8* 7.9* 8.0*  MG  --   --  1.8  --  2.3    GFR: Estimated Creatinine Clearance: 111.3 mL/min (by C-G formula based on SCr of 0.52 mg/dL).  Liver Function Tests: Recent Labs  Lab 06/16/18 0840 06/17/18 0010  AST 9* 7*  ALT 12 8  ALKPHOS 129* 84  BILITOT 1.8* 0.6  PROT 8.9* 6.5  ALBUMIN 3.9 2.8*   No results for input(s): LIPASE, AMYLASE in the last 168 hours. No results for input(s): AMMONIA in the last 168 hours.  Coagulation Profile: No results for input(s): INR, PROTIME in the last 168 hours.  Cardiac Enzymes: No results for input(s): CKTOTAL, CKMB, CKMBINDEX, TROPONINI in the last 168 hours.  BNP (last 3 results) No results for input(s): PROBNP in the last 8760 hours.  HbA1C: No results for input(s): HGBA1C in the last 72 hours.  CBG: Recent Labs  Lab  06/18/18 0046 06/18/18 0146 06/18/18 0247 06/18/18 0807 06/18/18 1124  GLUCAP 130* 119* 127* 176* 248*    Lipid Profile: No results for input(s): CHOL, HDL, LDLCALC, TRIG, CHOLHDL, LDLDIRECT in the last 72 hours.  Thyroid Function Tests: No results for input(s): TSH, T4TOTAL, FREET4, T3FREE, THYROIDAB in the last 72 hours.  Anemia Panel: No results for input(s): VITAMINB12, FOLATE, FERRITIN, TIBC, IRON, RETICCTPCT in the last 72 hours.  Urine analysis:    Component Value Date/Time   COLORURINE STRAW (A) 06/16/2018 0800   APPEARANCEUR CLEAR 06/16/2018 0800   LABSPEC 1.016 06/16/2018 0800   PHURINE 5.0 06/16/2018 0800   GLUCOSEU >=500 (A) 06/16/2018 0800   GLUCOSEU >=1000 12/01/2010 1051   HGBUR SMALL (A) 06/16/2018 0800   BILIRUBINUR NEGATIVE 06/16/2018 0800   KETONESUR 80 (A) 06/16/2018 0800   PROTEINUR 30 (A)  06/16/2018 0800   UROBILINOGEN 0.2 12/01/2010 1051   NITRITE NEGATIVE 06/16/2018 0800   LEUKOCYTESUR SMALL (A) 06/16/2018 0800    Sepsis Labs: Lactic Acid, Venous    Component Value Date/Time   LATICACIDVEN 2.2 (HH) 06/16/2018 1301    MICROBIOLOGY: Recent Results (from the past 240 hour(s))  Urine culture     Status: Abnormal   Collection Time: 06/16/18  8:00 AM  Result Value Ref Range Status   Specimen Description URINE, RANDOM  Final   Special Requests NONE  Final   Culture (A)  Final    <10,000 COLONIES/mL INSIGNIFICANT GROWTH Performed at [facility], [address] [place], [place] [address]    Report Status 06/17/2018 FINAL  Final  Blood culture (routine x 2)     Status: None (Preliminary result)   Collection Time: 06/16/18  8:30 AM  Result Value Ref Range Status   Specimen Description BLOOD RIGHT WRIST  Final   Special Requests   Final    BOTTLES DRAWN AEROBIC AND ANAEROBIC Blood Culture adequate volume   Culture   Final    NO GROWTH 1 DAY Performed at [facility], [address] [place], [place] [address]    Report Status  PENDING  Incomplete  Blood culture (routine x 2)     Status: Abnormal   Collection Time: 06/16/18  8:35 AM  Result Value Ref Range Status   Specimen Description BLOOD LEFT ANTECUBITAL  Final   Special Requests   Final    BOTTLES DRAWN AEROBIC AND ANAEROBIC Blood Culture results may not be optimal due to an inadequate volume of blood received in culture bottles   Culture  Setup Time   Final    AEROBIC BOTTLE ONLY GRAM NEGATIVE RODS CRITICAL RESULT CALLED TO, READ BACK BY AND VERIFIED WITH: L SEAY PHARMD 06/17/18 0056 JDW Performed at [facility], [address] [place], [place] [address]    Culture ESCHERICHIA COLI (A)  Final   Report Status 06/18/2018 FINAL  Final   Organism ID, Bacteria ESCHERICHIA COLI  Final      Susceptibility   Escherichia coli - MIC*    AMPICILLIN >=32 RESISTANT Resistant     CEFAZOLIN <=4 SENSITIVE Sensitive     CEFEPIME <=1 SENSITIVE Sensitive     CEFTAZIDIME <=1 SENSITIVE Sensitive     CEFTRIAXONE <=1 SENSITIVE Sensitive     CIPROFLOXACIN >=4 RESISTANT Resistant     GENTAMICIN <=1 SENSITIVE Sensitive     IMIPENEM <=0.25 SENSITIVE Sensitive     TRIMETH/SULFA <=20 SENSITIVE Sensitive     AMPICILLIN/SULBACTAM 8 SENSITIVE Sensitive     PIP/TAZO <=4 SENSITIVE Sensitive     Extended ESBL NEGATIVE Sensitive     * ESCHERICHIA COLI  Blood Culture ID Panel (Reflexed)     Status: Abnormal   Collection Time: 06/16/18  8:35 AM  Result Value Ref Range Status   Enterococcus species NOT DETECTED NOT DETECTED Final   Listeria monocytogenes NOT DETECTED NOT DETECTED Final   Staphylococcus species NOT DETECTED NOT DETECTED Final   Staphylococcus aureus (BCID) NOT DETECTED NOT DETECTED Final   Streptococcus species NOT DETECTED NOT DETECTED Final   Streptococcus agalactiae NOT DETECTED NOT DETECTED Final   Streptococcus pneumoniae NOT DETECTED NOT DETECTED Final   Streptococcus pyogenes NOT DETECTED NOT DETECTED Final   Acinetobacter baumannii NOT DETECTED NOT  DETECTED Final   Enterobacteriaceae species DETECTED (A) NOT DETECTED Final    Comment: Enterobacteriaceae represent a large family of  gram-negative bacteria, not a single organism. CRITICAL RESULT CALLED TO, READ BACK BY AND VERIFIED WITH: L SEAY PHARMD 06/17/18 0056 JDW    Enterobacter cloacae complex NOT DETECTED NOT DETECTED Final   Escherichia coli DETECTED (A) NOT DETECTED Final    Comment: CRITICAL RESULT CALLED TO, READ BACK BY AND VERIFIED WITH: L SEAY PHARMD 06/17/18 0056 JDW    Klebsiella oxytoca NOT DETECTED NOT DETECTED Final   Klebsiella pneumoniae NOT DETECTED NOT DETECTED Final   Proteus species NOT DETECTED NOT DETECTED Final   Serratia marcescens NOT DETECTED NOT DETECTED Final   Carbapenem resistance NOT DETECTED NOT DETECTED Final   Haemophilus influenzae NOT DETECTED NOT DETECTED Final   Neisseria meningitidis NOT DETECTED NOT DETECTED Final   Pseudomonas aeruginosa NOT DETECTED NOT DETECTED Final   Candida albicans NOT DETECTED NOT DETECTED Final   Candida glabrata NOT DETECTED NOT DETECTED Final   Candida krusei NOT DETECTED NOT DETECTED Final   Candida parapsilosis NOT DETECTED NOT DETECTED Final   Candida tropicalis NOT DETECTED NOT DETECTED Final    Comment: Performed at [facility], [address], [place], [place] [address]  SARS Coronavirus 2 (CEPHEID- Performed in [facility] hospital lab), Hosp Order     Status: None   Collection Time: 06/16/18  9:09 AM  Result Value Ref Range Status   SARS Coronavirus 2 NEGATIVE NEGATIVE Final    Comment: (NOTE) If result is NEGATIVE SARS-CoV-2 target nucleic acids are NOT DETECTED. The SARS-CoV-2 RNA is generally detectable in upper and lower  respiratory specimens during the acute phase of infection. The lowest  concentration of SARS-CoV-2 viral copies this assay can detect is 250  copies / mL. A negative result does not preclude SARS-CoV-2 infection  and should not be used as the sole basis for treatment or  other  patient management decisions.  A negative result may occur with  improper specimen collection / handling, submission of specimen other  than nasopharyngeal swab, presence of viral mutation(s) within the  areas targeted by this assay, and inadequate number of viral copies  (<250 copies / mL). A negative result must be combined with clinical  observations, patient history, and epidemiological information. If result is POSITIVE SARS-CoV-2 target nucleic acids are DETECTED. The SARS-CoV-2 RNA is generally detectable in upper and lower  respiratory specimens dur ing the acute phase of infection.  Positive  results are indicative of active infection with SARS-CoV-2.  Clinical  correlation with patient history and other diagnostic information is  necessary to determine patient infection status.  Positive results do  not rule out bacterial infection or co-infection with other viruses. If result is PRESUMPTIVE POSTIVE SARS-CoV-2 nucleic acids MAY BE PRESENT.   A presumptive positive result was obtained on the submitted specimen  and confirmed on repeat testing.  While 2019 novel coronavirus  (SARS-CoV-2) nucleic acids may be present in the submitted sample  additional confirmatory testing may be necessary for epidemiological  and / or clinical management purposes  to differentiate between  SARS-CoV-2 and other Sarbecovirus currently known to infect humans.  If clinically indicated additional testing with an alternate test  methodology [phone]) is advised. The SARS-CoV-2 RNA is generally  detectable in upper and lower respiratory sp ecimens during the acute  phase of infection. The expected result is Negative. Fact Sheet for Patients:  BoilerBrush.com.cy Fact Sheet for Healthcare Providers: https://pope.com/ This test is not yet approved or cleared by the [place] FDA and has been authorized for detection and/or  diagnosis of  SARS-CoV-2 by FDA under an Emergency Use Authorization (EUA).  This EUA will remain in effect (meaning this test can be used) for the duration of the COVID-19 declaration under Section 564(b)(1) of the Act, 21 U.S.C. section 360bbb-3(b)(1), unless the authorization is terminated or revoked sooner. Performed at [facility], [address] [place], [place] [address]     RADIOLOGY STUDIES/RESULTS: US Renal  Result Date: 06/16/2018 CLINICAL DATA:  Flank pain EXAM: RENAL / URINARY TRACT ULTRASOUND COMPLETE COMPARISON:  05/31/2018 FINDINGS: Right Kidney: Renal measurements: 13.5 x 5.1 x 6.0 cm = volume: 217 mL. Mild hydronephrosis is noted similar to that seen on prior CT examination. Left Kidney: Renal measurements: 12.1 x 5.9 x 6.0 cm = volume: 226 mL. Mild hydronephrosis is noted similar to that seen on prior CT examination. Bladder: Appears normal for degree of bladder distention. IMPRESSION: Mild hydronephrosis bilaterally stable from the previous exam. No other focal abnormality is noted. Electronically Signed   By: Alcide Clever M.D.   On: 06/16/2018 12:21   Dg Chest Portable 1 View  Result Date: 06/16/2018 CLINICAL DATA:  Sob  weakdka EXAM: PORTABLE CHEST 1 VIEW COMPARISON:  09/04/2017 FINDINGS: Normal mediastinum and cardiac silhouette. Normal pulmonary vasculature. No evidence of effusion, infiltrate, or pneumothorax. No acute bony abnormality. IMPRESSION: Normal chest radiograph. Electronically Signed   By: Genevive Bi M.D.   On: 06/16/2018 08:41   Ct Renal Stone Study  Result Date: 05/31/2018 CLINICAL DATA:  Type 1 diabetic. Abdominal pain. Assess for renal stone disease. EXAM: CT ABDOMEN AND PELVIS WITHOUT CONTRAST TECHNIQUE: Multidetector CT imaging of the abdomen and pelvis was performed following the standard protocol without IV contrast. COMPARISON:  03/25/2018 FINDINGS: Lower chest: Normal Hepatobiliary: Normal without contrast. Pancreas: Normal Spleen: Normal  Adrenals/Urinary Tract: Adrenal glands are normal. The kidneys are enlarged and show increased prominence of the renal collecting systems. Findings could go along with acute nephritis, diabetic glomerular nephropathy or low-grade obstruction. There is no evidence of stone disease. Bladder appears normal. No stone in the bladder. Stomach/Bowel: Normal Vascular/Lymphatic: Normal Reproductive: Normal.  No pelvic mass. Other: Tiny amount of free fluid in the pelvic cul de sac, often seen in females of this age. Musculoskeletal: Negative IMPRESSION: Large kidneys with mild fullness of the renal collecting systems and ureters but no evidence of stone disease or obstructing lesion. Renal enlargement can be seen with diabetic glomerulonephropathy. However, the presence of the renal collecting system fullness raises possibility of urinary tract infection and acute nephritis. Electronically Signed   By: Paulina Fusi M.D.   On: 05/31/2018 21:58     LOS: 1 day   Jeoffrey Massed, MD  [facility]  If 7PM-7AM, please contact night-coverage  Please page via [email or web address]  Go to [email or web address] and use [place]'s universal password to access. If you do not have the password, please contact the hospital operator.  Locate the [facility] provider you are looking for under [facility] and page to a number that you can be directly reached. If you still have difficulty reaching the provider, please page the [facility] (Director on Call) for the Hospitalists listed on amion for assistance.  06/18/2018, 1:38 PM

## 2018-06-19 ENCOUNTER — Encounter (HOSPITAL_COMMUNITY): Payer: Self-pay | Admitting: Pharmacist

## 2018-06-19 LAB — CBC
HCT: 26.4 % — ABNORMAL LOW (ref 36.0–46.0)
Hemoglobin: 8.7 g/dL — ABNORMAL LOW (ref 12.0–15.0)
MCH: 29.8 pg (ref 26.0–34.0)
MCHC: 33 g/dL (ref 30.0–36.0)
MCV: 90.4 fL (ref 80.0–100.0)
Platelets: 214 10*3/uL (ref 150–400)
RBC: 2.92 MIL/uL — ABNORMAL LOW (ref 3.87–5.11)
RDW: 13.3 % (ref 11.5–15.5)
WBC: 9.4 10*3/uL (ref 4.0–10.5)
nRBC: 0 % (ref 0.0–0.2)

## 2018-06-19 LAB — BASIC METABOLIC PANEL
Anion gap: 9 (ref 5–15)
BUN: <5 mg/dL — ABNORMAL LOW (ref 6–20)
CO2: 23 mmol/L (ref 22–32)
Calcium: 8.3 mg/dL — ABNORMAL LOW (ref 8.9–10.3)
Chloride: 103 mmol/L (ref 98–111)
Creatinine, Ser: 0.4 mg/dL — ABNORMAL LOW (ref 0.44–1.00)
GFR calc Af Amer: >60 mL/min (ref >60)
GFR calc non Af Amer: >60 mL/min (ref >60)
Glucose, Bld: 138 mg/dL — ABNORMAL HIGH (ref 70–99)
Potassium: 3.5 mmol/L (ref 3.5–5.1)
Sodium: 135 mmol/L (ref 135–145)

## 2018-06-19 LAB — GLUCOSE, CAPILLARY
Glucose-Capillary: 117 mg/dL — ABNORMAL HIGH (ref 70–99)
Glucose-Capillary: 145 mg/dL — ABNORMAL HIGH (ref 70–99)

## 2018-06-19 MED ORDER — CEFDINIR 300 MG PO CAPS: 300.0000 mg | ORAL_CAPSULE | Freq: Two times a day (BID) | ORAL | 0 refills | Status: AC

## 2018-06-19 MED ORDER — INSULIN DETEMIR 100 UNIT/ML ~~LOC~~ SOLN: 20.0000 [IU] | Freq: Every day | SUBCUTANEOUS | 11 refills | Status: DC

## 2018-06-19 NOTE — Discharge Summary (Signed)
Physician Discharge Summary  Mandy Patel GHW:[id number] DOB: [birth year] DOA: 06/16/2018  PCP: Patient, No Pcp Per  Admit date: 06/16/2018  Discharge date: 06/19/2018  Admitted From:Home  Disposition:  Home  Recommendations for Outpatient Follow-up:  1. Follow up with PCP in 1-2 weeks 2. Continue on insulin regimen and Omnicef to complete course of treatment as prescribed  Home Health: None  Equipment/Devices: None  Discharge Condition: Stable  CODE STATUS: Full  Diet recommendation: Heart Healthy/carb modified  Brief/Interim Summary: Per HPI: Patient is a 23 y.o. female with history of DM-1-noncompliant with insulin (last A1c on 4/30 was 13.0)-presented with DKA, AKI, and E. coli bacteremia.  She was treated with IV insulin and fluids and has been transitioned to subcutaneous insulin and is currently stable for discharge.  She is noted to have bacteremia with E. coli likely secondary to UTI and will remain on Omnicef for 7 more days.  COVID-19 screening noted to be negative.  She will remain more compliant with her home medications at this time and follow-up with PCP in 1 to 2 weeks.  Discharge Diagnoses:  Active Problems:   DKA (diabetic ketoacidoses) (HCC)   Nausea and vomiting   DKA, type 1 (HCC)   Hyperkalemia   Acute renal failure (ARF) (HCC)   Elevated liver enzymes    Discharge Instructions  Discharge Instructions    Diet - low sodium heart healthy   Complete by:  As directed    Increase activity slowly   Complete by:  As directed      Allergies as of 06/19/2018      Reactions   Peanut Oil Other (See Comments)   Reaction to peanuts - gums feel numb and tingling   Other Swelling   Pecans caused throat swelling and weakness      Medication List    TAKE these medications   blood glucose meter kit and supplies Kit Dispense based on patient and insurance preference. Use up to four times daily as directed. (FOR ICD-9 250.00, 250.01).   cefdinir 300 MG  capsule Commonly known as:  OMNICEF Take 1 capsule (300 mg total) by mouth 2 (two) times daily for 7 days.   etonogestrel 68 MG Impl implant Commonly known as:  NEXPLANON 1 each by Subdermal route once. **Implanted Sept 2016**   insulin detemir 100 UNIT/ML injection Commonly known as:  LEVEMIR Inject 0.2 mLs (20 Units total) into the skin at bedtime. What changed:  how much to take   insulin lispro 100 UNIT/ML injection Commonly known as:  HumaLOG Inject 0.04 mLs (4 Units total) into the skin 3 (three) times daily with meals.      Follow-up Information    pcp Follow up in 1 week(s).          Allergies  Allergen Reactions  . Peanut Oil Other (See Comments)    Reaction to peanuts - gums feel numb and tingling  . Other Swelling    Pecans caused throat swelling and weakness    Consultations:  None   Procedures/Studies: US Renal  Result Date: 06/16/2018 CLINICAL DATA:  Flank pain EXAM: RENAL / URINARY TRACT ULTRASOUND COMPLETE COMPARISON:  05/31/2018 FINDINGS: Right Kidney: Renal measurements: 13.5 x 5.1 x 6.0 cm = volume: 217 mL. Mild hydronephrosis is noted similar to that seen on prior CT examination. Left Kidney: Renal measurements: 12.1 x 5.9 x 6.0 cm = volume: 226 mL. Mild hydronephrosis is noted similar to that seen on prior CT examination. Bladder: Appears normal for  degree of bladder distention. IMPRESSION: Mild hydronephrosis bilaterally stable from the previous exam. No other focal abnormality is noted. Electronically Signed   By: Inez Catalina M.D.   On: 06/16/2018 12:21   Dg Chest Portable 1 View  Result Date: 06/16/2018 CLINICAL DATA:  Sob  weakdka EXAM: PORTABLE CHEST 1 VIEW COMPARISON:  09/04/2017 FINDINGS: Normal mediastinum and cardiac silhouette. Normal pulmonary vasculature. No evidence of effusion, infiltrate, or pneumothorax. No acute bony abnormality. IMPRESSION: Normal chest radiograph. Electronically Signed   By: Suzy Bouchard M.D.   On: 06/16/2018  08:41   Ct Renal Stone Study  Result Date: 05/31/2018 CLINICAL DATA:  Type 1 diabetic. Abdominal pain. Assess for renal stone disease. EXAM: CT ABDOMEN AND PELVIS WITHOUT CONTRAST TECHNIQUE: Multidetector CT imaging of the abdomen and pelvis was performed following the standard protocol without IV contrast. COMPARISON:  03/25/2018 FINDINGS: Lower chest: Normal Hepatobiliary: Normal without contrast. Pancreas: Normal Spleen: Normal Adrenals/Urinary Tract: Adrenal glands are normal. The kidneys are enlarged and show increased prominence of the renal collecting systems. Findings could go along with acute nephritis, diabetic glomerular nephropathy or low-grade obstruction. There is no evidence of stone disease. Bladder appears normal. No stone in the bladder. Stomach/Bowel: Normal Vascular/Lymphatic: Normal Reproductive: Normal.  No pelvic mass. Other: Tiny amount of free fluid in the pelvic cul de sac, often seen in females of this age. Musculoskeletal: Negative IMPRESSION: Large kidneys with mild fullness of the renal collecting systems and ureters but no evidence of stone disease or obstructing lesion. Renal enlargement can be seen with diabetic glomerulonephropathy. However, the presence of the renal collecting system fullness raises possibility of urinary tract infection and acute nephritis. Electronically Signed   By: Nelson Chimes M.D.   On: 05/31/2018 21:58     Discharge Exam: Vitals:   06/19/18 0011 06/19/18 0519  BP: 102/67 112/73  Pulse: 98 92  Resp: 14 20  Temp: 98.3 F (36.8 C) 98.5 F (36.9 C)  SpO2:     Vitals:   06/18/18 0855 06/18/18 1900 06/19/18 0011 06/19/18 0519  BP: 114/61  102/67 112/73  Pulse: (!) 108  98 92  Resp: _0 Temp: 98.6 F (37 C) 99.3 F (37.4 C) 98.3 F (36.8 C) 98.5 F (36.9 C)  TempSrc: Oral  Oral Oral  SpO2: 99%     Weight:      Height:        General: Pt is alert, awake, not in acute distress Cardiovascular: RRR, S1/S2 +, no rubs, no  gallops Respiratory: CTA bilaterally, no wheezing, no rhonchi Abdominal: Soft, NT, ND, bowel sounds + Extremities: no edema, no cyanosis    The results of significant diagnostics from this hospitalization (including imaging, microbiology, ancillary and laboratory) are listed below for reference.     Microbiology: Recent Results (from the past 240 hour(s))  Urine culture     Status: Abnormal   Collection Time: 06/16/18  8:00 AM  Result Value Ref Range Status   Specimen Description URINE, RANDOM  Final   Special Requests NONE  Final   Culture (A)  Final    <10,000 COLONIES/mL INSIGNIFICANT GROWTH Performed at [facility], [address] [place], [place] [address]    Report Status 06/17/2018 FINAL  Final  Blood culture (routine x 2)     Status: None (Preliminary result)   Collection Time: 06/16/18  8:30 AM  Result Value Ref Range Status   Specimen Description BLOOD RIGHT WRIST  Final   Special  Requests   Final    BOTTLES DRAWN AEROBIC AND ANAEROBIC Blood Culture adequate volume   Culture   Final    NO GROWTH 3 DAYS Performed at [facility], [place] [address], [place], [place] [address]    Report Status PENDING  Incomplete  Blood culture (routine x 2)     Status: Abnormal   Collection Time: 06/16/18  8:35 AM  Result Value Ref Range Status   Specimen Description BLOOD LEFT ANTECUBITAL  Final   Special Requests   Final    BOTTLES DRAWN AEROBIC AND ANAEROBIC Blood Culture results may not be optimal due to an inadequate volume of blood received in culture bottles   Culture  Setup Time   Final    AEROBIC BOTTLE ONLY GRAM NEGATIVE RODS CRITICAL RESULT CALLED TO, READ BACK BY AND VERIFIED WITH: L SEAY PHARMD 06/17/18 0056 JDW Performed at [facility], [place] [address] [place], [place] [address]    Culture ESCHERICHIA COLI (A)  Final   Report Status 06/18/2018 FINAL  Final   Organism ID, Bacteria ESCHERICHIA COLI  Final      Susceptibility   Escherichia coli -  MIC*    AMPICILLIN >=32 RESISTANT Resistant     CEFAZOLIN <=4 SENSITIVE Sensitive     CEFEPIME <=1 SENSITIVE Sensitive     CEFTAZIDIME <=1 SENSITIVE Sensitive     CEFTRIAXONE <=1 SENSITIVE Sensitive     CIPROFLOXACIN >=4 RESISTANT Resistant     GENTAMICIN <=1 SENSITIVE Sensitive     IMIPENEM <=0.25 SENSITIVE Sensitive     TRIMETH/SULFA <=20 SENSITIVE Sensitive     AMPICILLIN/SULBACTAM 8 SENSITIVE Sensitive     PIP/TAZO <=4 SENSITIVE Sensitive     Extended ESBL NEGATIVE Sensitive     * ESCHERICHIA COLI  Blood Culture ID Panel (Reflexed)     Status: Abnormal   Collection Time: 06/16/18  8:35 AM  Result Value Ref Range Status   Enterococcus species NOT DETECTED NOT DETECTED Final   Listeria monocytogenes NOT DETECTED NOT DETECTED Final   Staphylococcus species NOT DETECTED NOT DETECTED Final   Staphylococcus aureus (BCID) NOT DETECTED NOT DETECTED Final   Streptococcus species NOT DETECTED NOT DETECTED Final   Streptococcus agalactiae NOT DETECTED NOT DETECTED Final   Streptococcus pneumoniae NOT DETECTED NOT DETECTED Final   Streptococcus pyogenes NOT DETECTED NOT DETECTED Final   Acinetobacter baumannii NOT DETECTED NOT DETECTED Final   Enterobacteriaceae species DETECTED (A) NOT DETECTED Final    Comment: Enterobacteriaceae represent a large family of gram-negative bacteria, not a single organism. CRITICAL RESULT CALLED TO, READ BACK BY AND VERIFIED WITH: L SEAY PHARMD 06/17/18 0056 JDW    Enterobacter cloacae complex NOT DETECTED NOT DETECTED Final   Escherichia coli DETECTED (A) NOT DETECTED Final    Comment: CRITICAL RESULT CALLED TO, READ BACK BY AND VERIFIED WITH: L SEAY PHARMD 06/17/18 0056 JDW    Klebsiella oxytoca NOT DETECTED NOT DETECTED Final   Klebsiella pneumoniae NOT DETECTED NOT DETECTED Final   Proteus species NOT DETECTED NOT DETECTED Final   Serratia marcescens NOT DETECTED NOT DETECTED Final   Carbapenem resistance NOT DETECTED NOT DETECTED Final    Haemophilus influenzae NOT DETECTED NOT DETECTED Final   Neisseria meningitidis NOT DETECTED NOT DETECTED Final   Pseudomonas aeruginosa NOT DETECTED NOT DETECTED Final   Candida albicans NOT DETECTED NOT DETECTED Final   Candida glabrata NOT DETECTED NOT DETECTED Final   Candida krusei NOT DETECTED NOT DETECTED Final   Candida parapsilosis  NOT DETECTED NOT DETECTED Final   Candida tropicalis NOT DETECTED NOT DETECTED Final    Comment: Performed at [facility], [place] [address] [place], [place] [address]  SARS Coronavirus 2 (CEPHEID- Performed in [place] hospital lab), Hosp Order     Status: None   Collection Time: 06/16/18  9:09 AM  Result Value Ref Range Status   SARS Coronavirus 2 NEGATIVE NEGATIVE Final    Comment: (NOTE) If result is NEGATIVE SARS-CoV-2 target nucleic acids are NOT DETECTED. The SARS-CoV-2 RNA is generally detectable in upper and lower  respiratory specimens during the acute phase of infection. The lowest  concentration of SARS-CoV-2 viral copies this assay can detect is 250  copies / mL. A negative result does not preclude SARS-CoV-2 infection  and should not be used as the sole basis for treatment or other  patient management decisions.  A negative result may occur with  improper specimen collection / handling, submission of specimen other  than nasopharyngeal swab, presence of viral mutation(s) within the  areas targeted by this assay, and inadequate number of viral copies  (<250 copies / mL). A negative result must be combined with clinical  observations, patient history, and epidemiological information. If result is POSITIVE SARS-CoV-2 target nucleic acids are DETECTED. The SARS-CoV-2 RNA is generally detectable in upper and lower  respiratory specimens dur ing the acute phase of infection.  Positive  results are indicative of active infection with SARS-CoV-2.  Clinical  correlation with patient history and other diagnostic information is   necessary to determine patient infection status.  Positive results do  not rule out bacterial infection or co-infection with other viruses. If result is PRESUMPTIVE POSTIVE SARS-CoV-2 nucleic acids MAY BE PRESENT.   A presumptive positive result was obtained on the submitted specimen  and confirmed on repeat testing.  While 2019 novel coronavirus  (SARS-CoV-2) nucleic acids may be present in the submitted sample  additional confirmatory testing may be necessary for epidemiological  and / or clinical management purposes  to differentiate between  SARS-CoV-2 and other Sarbecovirus currently known to infect humans.  If clinically indicated additional testing with an alternate test  methodology [phone]) is advised. The SARS-CoV-2 RNA is generally  detectable in upper and lower respiratory sp ecimens during the acute  phase of infection. The expected result is Negative. Fact Sheet for Patients:  StrictlyIdeas.no Fact Sheet for Healthcare Providers: BankingDealers.co.za This test is not yet approved or cleared by the [place] FDA and has been authorized for detection and/or diagnosis of SARS-CoV-2 by FDA under an Emergency Use Authorization (EUA).  This EUA will remain in effect (meaning this test can be used) for the duration of the COVID-19 declaration under Section 564(b)(1) of the Act, 21 U.S.C. section 360bbb-3(b)(1), unless the authorization is terminated or revoked sooner. Performed at [facility], [place] [address], [place], [place] [address]      Labs: BNP (last 3 results) No results for input(s): BNP in the last 8760 hours. Basic Metabolic Panel: Recent Labs  Lab 06/17/18 0825 06/17/18 1252 06/17/18 1825 06/18/18 0209 06/19/18 0442  NA 132* 131* 133* 135 135  K 2.7* 2.9* 3.1* 3.2* 3.5  CL 102 98 103 103 103  CO2 20* 20* 21* 21* 23  GLUCOSE 168* 213* 192* 135* 138*  BUN 6 <5* <5* <5* <5*  CREATININE 0.57 0.74  0.55 0.52 0.40*  CALCIUM 7.9* 7.8* 7.9* 8.0* 8.3*  MG  --  1.8  --  2.3  --  Liver Function Tests: Recent Labs  Lab 06/16/18 0840 06/17/18 0010  AST 9* 7*  ALT 12 8  ALKPHOS 129* 84  BILITOT 1.8* 0.6  PROT 8.9* 6.5  ALBUMIN 3.9 2.8*   No results for input(s): LIPASE, AMYLASE in the last 168 hours. No results for input(s): AMMONIA in the last 168 hours. CBC: Recent Labs  Lab 06/16/18 0840 06/16/18 0853 06/17/18 0010 06/18/18 0209 06/19/18 0442  WBC 33.8*  --  21.5* 11.9* 9.4  NEUTROABS 30.3*  --   --   --   --   HGB 12.2 13.9 9.6* 8.7* 8.7*  HCT 41.3 41.0 28.6* 26.4* 26.4*  MCV 101.7*  --  89.9 89.5 90.4  PLT 369  --  255 201 214   Cardiac Enzymes: No results for input(s): CKTOTAL, CKMB, CKMBINDEX, TROPONINI in the last 168 hours. BNP: Invalid input(s): POCBNP CBG: Recent Labs  Lab 06/18/18 1124 06/18/18 1629 06/18/18 2208 06/19/18 0752 06/19/18 1131  GLUCAP 248* 241* 185* 117* 145*   D-Dimer No results for input(s): DDIMER in the last 72 hours. Hgb A1c No results for input(s): HGBA1C in the last 72 hours. Lipid Profile No results for input(s): CHOL, HDL, LDLCALC, TRIG, CHOLHDL, LDLDIRECT in the last 72 hours. Thyroid function studies No results for input(s): TSH, T4TOTAL, T3FREE, THYROIDAB in the last 72 hours.  Invalid input(s): FREET3 Anemia work up No results for input(s): VITAMINB12, FOLATE, FERRITIN, TIBC, IRON, RETICCTPCT in the last 72 hours. Urinalysis    Component Value Date/Time   COLORURINE STRAW (A) 06/16/2018 0800   APPEARANCEUR CLEAR 06/16/2018 0800   LABSPEC 1.016 06/16/2018 0800   PHURINE 5.0 06/16/2018 0800   GLUCOSEU >=500 (A) 06/16/2018 0800   GLUCOSEU >=1000 12/01/2010 1051   HGBUR SMALL (A) 06/16/2018 0800   BILIRUBINUR NEGATIVE 06/16/2018 0800   KETONESUR 80 (A) 06/16/2018 0800   PROTEINUR 30 (A) 06/16/2018 0800   UROBILINOGEN 0.2 12/01/2010 1051   NITRITE NEGATIVE 06/16/2018 0800   LEUKOCYTESUR SMALL (A) 06/16/2018  0800   Sepsis Labs Invalid input(s): PROCALCITONIN,  WBC,  LACTICIDVEN Microbiology Recent Results (from the past 240 hour(s))  Urine culture     Status: Abnormal   Collection Time: 06/16/18  8:00 AM  Result Value Ref Range Status   Specimen Description URINE, RANDOM  Final   Special Requests NONE  Final   Culture (A)  Final    <10,000 COLONIES/mL INSIGNIFICANT GROWTH Performed at [facility], [address] [place], [place] [address]    Report Status 06/17/2018 FINAL  Final  Blood culture (routine x 2)     Status: None (Preliminary result)   Collection Time: 06/16/18  8:30 AM  Result Value Ref Range Status   Specimen Description BLOOD RIGHT WRIST  Final   Special Requests   Final    BOTTLES DRAWN AEROBIC AND ANAEROBIC Blood Culture adequate volume   Culture   Final    NO GROWTH 3 DAYS Performed at [facility], [place] [address] [place], [place] [address]    Report Status PENDING  Incomplete  Blood culture (routine x 2)     Status: Abnormal   Collection Time: 06/16/18  8:35 AM  Result Value Ref Range Status   Specimen Description BLOOD LEFT ANTECUBITAL  Final   Special Requests   Final    BOTTLES DRAWN AEROBIC AND ANAEROBIC Blood Culture results may not be optimal due to an inadequate volume of blood received in culture bottles   Culture  Setup Time  Final    AEROBIC BOTTLE ONLY GRAM NEGATIVE RODS CRITICAL RESULT CALLED TO, READ BACK BY AND VERIFIED WITH: L SEAY PHARMD 06/17/18 0056 JDW Performed at [facility], [place] [address], [place], [place] [address]    Culture ESCHERICHIA COLI (A)  Final   Report Status 06/18/2018 FINAL  Final   Organism ID, Bacteria ESCHERICHIA COLI  Final      Susceptibility   Escherichia coli - MIC*    AMPICILLIN >=32 RESISTANT Resistant     CEFAZOLIN <=4 SENSITIVE Sensitive     CEFEPIME <=1 SENSITIVE Sensitive     CEFTAZIDIME <=1 SENSITIVE Sensitive     CEFTRIAXONE <=1 SENSITIVE Sensitive     CIPROFLOXACIN >=4 RESISTANT  Resistant     GENTAMICIN <=1 SENSITIVE Sensitive     IMIPENEM <=0.25 SENSITIVE Sensitive     TRIMETH/SULFA <=20 SENSITIVE Sensitive     AMPICILLIN/SULBACTAM 8 SENSITIVE Sensitive     PIP/TAZO <=4 SENSITIVE Sensitive     Extended ESBL NEGATIVE Sensitive     * ESCHERICHIA COLI  Blood Culture ID Panel (Reflexed)     Status: Abnormal   Collection Time: 06/16/18  8:35 AM  Result Value Ref Range Status   Enterococcus species NOT DETECTED NOT DETECTED Final   Listeria monocytogenes NOT DETECTED NOT DETECTED Final   Staphylococcus species NOT DETECTED NOT DETECTED Final   Staphylococcus aureus (BCID) NOT DETECTED NOT DETECTED Final   Streptococcus species NOT DETECTED NOT DETECTED Final   Streptococcus agalactiae NOT DETECTED NOT DETECTED Final   Streptococcus pneumoniae NOT DETECTED NOT DETECTED Final   Streptococcus pyogenes NOT DETECTED NOT DETECTED Final   Acinetobacter baumannii NOT DETECTED NOT DETECTED Final   Enterobacteriaceae species DETECTED (A) NOT DETECTED Final    Comment: Enterobacteriaceae represent a large family of gram-negative bacteria, not a single organism. CRITICAL RESULT CALLED TO, READ BACK BY AND VERIFIED WITH: L SEAY PHARMD 06/17/18 0056 JDW    Enterobacter cloacae complex NOT DETECTED NOT DETECTED Final   Escherichia coli DETECTED (A) NOT DETECTED Final    Comment: CRITICAL RESULT CALLED TO, READ BACK BY AND VERIFIED WITH: L SEAY PHARMD 06/17/18 0056 JDW    Klebsiella oxytoca NOT DETECTED NOT DETECTED Final   Klebsiella pneumoniae NOT DETECTED NOT DETECTED Final   Proteus species NOT DETECTED NOT DETECTED Final   Serratia marcescens NOT DETECTED NOT DETECTED Final   Carbapenem resistance NOT DETECTED NOT DETECTED Final   Haemophilus influenzae NOT DETECTED NOT DETECTED Final   Neisseria meningitidis NOT DETECTED NOT DETECTED Final   Pseudomonas aeruginosa NOT DETECTED NOT DETECTED Final   Candida albicans NOT DETECTED NOT DETECTED Final   Candida glabrata NOT  DETECTED NOT DETECTED Final   Candida krusei NOT DETECTED NOT DETECTED Final   Candida parapsilosis NOT DETECTED NOT DETECTED Final   Candida tropicalis NOT DETECTED NOT DETECTED Final    Comment: Performed at [facility], [place] [address], [place], [place] [address]  SARS Coronavirus 2 (CEPHEID- Performed in [place] hospital lab), Hosp Order     Status: None   Collection Time: 06/16/18  9:09 AM  Result Value Ref Range Status   SARS Coronavirus 2 NEGATIVE NEGATIVE Final    Comment: (NOTE) If result is NEGATIVE SARS-CoV-2 target nucleic acids are NOT DETECTED. The SARS-CoV-2 RNA is generally detectable in upper and lower  respiratory specimens during the acute phase of infection. The lowest  concentration of SARS-CoV-2 viral copies this assay can detect is 250  copies / mL. A  negative result does not preclude SARS-CoV-2 infection  and should not be used as the sole basis for treatment or other  patient management decisions.  A negative result may occur with  improper specimen collection / handling, submission of specimen other  than nasopharyngeal swab, presence of viral mutation(s) within the  areas targeted by this assay, and inadequate number of viral copies  (<250 copies / mL). A negative result must be combined with clinical  observations, patient history, and epidemiological information. If result is POSITIVE SARS-CoV-2 target nucleic acids are DETECTED. The SARS-CoV-2 RNA is generally detectable in upper and lower  respiratory specimens dur ing the acute phase of infection.  Positive  results are indicative of active infection with SARS-CoV-2.  Clinical  correlation with patient history and other diagnostic information is  necessary to determine patient infection status.  Positive results do  not rule out bacterial infection or co-infection with other viruses. If result is PRESUMPTIVE POSTIVE SARS-CoV-2 nucleic acids MAY BE PRESENT.   A presumptive positive result was  obtained on the submitted specimen  and confirmed on repeat testing.  While 2019 novel coronavirus  (SARS-CoV-2) nucleic acids may be present in the submitted sample  additional confirmatory testing may be necessary for epidemiological  and / or clinical management purposes  to differentiate between  SARS-CoV-2 and other Sarbecovirus currently known to infect humans.  If clinically indicated additional testing with an alternate test  methodology [phone]) is advised. The SARS-CoV-2 RNA is generally  detectable in upper and lower respiratory sp ecimens during the acute  phase of infection. The expected result is Negative. Fact Sheet for Patients:  StrictlyIdeas.no Fact Sheet for Healthcare Providers: BankingDealers.co.za This test is not yet approved or cleared by the [place] FDA and has been authorized for detection and/or diagnosis of SARS-CoV-2 by FDA under an Emergency Use Authorization (EUA).  This EUA will remain in effect (meaning this test can be used) for the duration of the COVID-19 declaration under Section 564(b)(1) of the Act, 21 U.S.C. section 360bbb-3(b)(1), unless the authorization is terminated or revoked sooner. Performed at [facility], [place] [address] [place], [place] [address]      Time coordinating discharge: 35 minutes  SIGNED:   Rodena Goldmann, DO [facility] 06/19/2018, 11:37 AM  If 7PM-7AM, please contact night-coverage [email or web address] Password [email or web address]

## 2018-06-21 LAB — CULTURE, BLOOD (ROUTINE X 2)
Culture: NO GROWTH
Special Requests: ADEQUATE

## 2018-08-02 ENCOUNTER — Telehealth: Payer: Self-pay | Admitting: Pharmacy Technician

## 2018-08-02 NOTE — Telephone Encounter (Signed)
Patient failed to provide requested financial documentation.  Financial documentation is required in order to determine patient's eligibility for [facility]'s program.  No additional medication assistance will be provided until patient provides requested financial documentation.  Patient notified by letter.  Aasiyah Auerbach Pharmacy Technician/Eligibility Specialist [facility] 

## 2018-08-28 ENCOUNTER — Other Ambulatory Visit: Payer: Self-pay

## 2018-08-28 DIAGNOSIS — Z20822 Contact with and (suspected) exposure to covid-19: Secondary | ICD-10-CM

## 2018-08-29 LAB — NOVEL CORONAVIRUS, NAA: SARS-CoV-2, NAA: NOT DETECTED

## 2019-09-10 ENCOUNTER — Emergency Department: Payer: Self-pay

## 2019-09-10 ENCOUNTER — Emergency Department
Admission: EM | Admit: 2019-09-10 | Discharge: 2019-09-10 | Disposition: A | Discharge status: Home / Self Care | Payer: Self-pay | Attending: Emergency Medicine | Admitting: Emergency Medicine

## 2019-09-10 ENCOUNTER — Encounter: Payer: Self-pay | Admitting: Emergency Medicine

## 2019-09-10 ENCOUNTER — Other Ambulatory Visit: Payer: Self-pay

## 2019-09-10 DIAGNOSIS — Z975 Presence of (intrauterine) contraceptive device: Secondary | ICD-10-CM | POA: Insufficient documentation

## 2019-09-10 DIAGNOSIS — Z79899 Other long term (current) drug therapy: Secondary | ICD-10-CM | POA: Insufficient documentation

## 2019-09-10 DIAGNOSIS — E1010 Type 1 diabetes mellitus with ketoacidosis without coma: Secondary | ICD-10-CM | POA: Insufficient documentation

## 2019-09-10 DIAGNOSIS — M79622 Pain in left upper arm: Secondary | ICD-10-CM | POA: Insufficient documentation

## 2019-09-10 DIAGNOSIS — Z3046 Encounter for surveillance of implantable subdermal contraceptive: Secondary | ICD-10-CM

## 2019-09-10 DIAGNOSIS — E1065 Type 1 diabetes mellitus with hyperglycemia: Secondary | ICD-10-CM | POA: Insufficient documentation

## 2019-09-10 DIAGNOSIS — Z794 Long term (current) use of insulin: Secondary | ICD-10-CM | POA: Insufficient documentation

## 2019-09-10 DIAGNOSIS — I10 Essential (primary) hypertension: Secondary | ICD-10-CM | POA: Insufficient documentation

## 2019-09-10 DIAGNOSIS — M79602 Pain in left arm: Secondary | ICD-10-CM

## 2019-09-10 MED ORDER — NAPROXEN 500 MG PO TABS
500.0000 mg | ORAL_TABLET | Freq: Once | ORAL | Status: AC
  Administered 2019-09-10: 500 mg via ORAL
  Filled 2019-09-10: qty 1

## 2019-09-10 NOTE — ED Provider Notes (Signed)
[facility] Emergency Department Provider Note  ____________________________________________   First MD Initiated Contact with Patient 09/10/19 1151     (approximate)  I have reviewed the triage vital signs and the nursing notes.   HISTORY  Chief Complaint Arm Pain   HPI Mandy Patel is a 24 y.o. female with a past medical history of type 1 diabetes and Nexplanon implanted in her left upper extremity approximately 1 year ago who presents for assessment approximately 2 days of pain and soreness around the site of her Nexplanon.  Patient states she often sleeps on that arm and thinks this may be related to the onset of the pain she would think she may have slept on it funny.  She denies any other pain, shortness of breath, fevers, chills, cough, pain in her distal left forearm, right arm, or elsewhere.  Denies nausea, vomiting, diarrhea, dysuria, rash, or other acute complaints.  Denies any traumatic injuries.  No prior similar episodes.  No alleviating aggravating factors.         Past Medical History:  Diagnosis Date  . Diabetes mellitus    type I, diagnosed age 86yo  . Pyelonephritis 03/2017    Patient Active Problem List   Diagnosis Date Noted  . DKA, type 1 ([place]) 06/16/2018  . Hyperkalemia 06/16/2018  . Acute renal failure (ARF) ([place]) 06/16/2018  . Elevated liver enzymes 06/16/2018  . Sepsis ([place]) 09/04/2017  . Type 1 diabetes mellitus with hyperglycemia ([place]) 03/15/2017  . Acute pyelonephritis 03/15/2017  . Acute right flank pain 03/15/2017  . Influenza A 03/06/2017  . Nausea and vomiting 03/06/2017  . DKA (diabetic ketoacidoses) ([place]) 06/12/2015  . Type I (juvenile type) diabetes mellitus without mention of complication, uncontrolled 04/26/2011  . Preventative health care 12/01/2010  . Obesity 06/19/2010  . Goiter, unspecified 06/19/2010  . HYPERCHOLESTEROLEMIA 04/25/2008    Past Surgical History:  Procedure Laterality Date  . EYE  MUSCLE SURGERY  approx 2010   bilat eye surgury, left exotropia worse;Dr Annamaria Boots, opthomology    Prior to Admission medications   Medication Sig Start Date End Date Taking? Authorizing Provider  blood glucose meter kit and supplies KIT Dispense based on patient and insurance preference. Use up to four times daily as directed. (FOR ICD-9 250.00, 250.01). 06/01/18   Bettey Costa, MD  etonogestrel (NEXPLANON) 68 MG IMPL implant 1 each by Subdermal route once. **Implanted Sept 2016**    [provider]  insulin detemir (LEVEMIR) 100 UNIT/ML injection Inject 0.2 mLs (20 Units total) into the skin at bedtime. 06/19/18   Manuella Ghazi, Pratik D, DO  insulin lispro (HUMALOG) 100 UNIT/ML injection Inject 0.04 mLs (4 Units total) into the skin 3 (three) times daily with meals. 06/01/18   Bettey Costa, MD    Allergies Peanut oil and Other  Family History  Problem Relation Age of Onset  . Alcohol abuse Other   . Arthritis Other   . Hyperlipidemia Other   . Stroke Other   . Diabetes Other   . Hyperlipidemia Other   . Hypertension Other   . Diabetes Other     Social History Social History   Tobacco Use  . Smoking status: Never Smoker  . Smokeless tobacco: Never Used  Vaping Use  . Vaping Use: Never used  Substance Use Topics  . Alcohol use: No  . Drug use: Yes    Types: Marijuana    Review of Systems  Review of Systems  Constitutional: Negative for chills and fever.  HENT: Negative for sore throat.   Eyes: Negative for pain.  Respiratory: Negative for cough and stridor.   Cardiovascular: Negative for chest pain.  Gastrointestinal: Negative for vomiting.  Musculoskeletal: Positive for myalgias.  Skin: Negative for rash.  Neurological: Negative for seizures, loss of consciousness and headaches.  Psychiatric/Behavioral: Negative for suicidal ideas.  All other systems reviewed and are negative.     ____________________________________________   PHYSICAL EXAM:  VITAL SIGNS: ED  Triage Vitals  Enc Vitals Group     BP 09/10/19 1034 (!) 146/97     Pulse Rate 09/10/19 1034 (!) 115     Resp 09/10/19 1034 16     Temp 09/10/19 1034 97.6 F (36.4 C)     Temp Source 09/10/19 1034 Oral     SpO2 09/10/19 1034 100 %     Weight 09/10/19 1035 165 lb (74.8 kg)     Height 09/10/19 1035 5' 8"  (1.727 m)     Head Circumference --      Peak Flow --      Pain Score 09/10/19 1035 8     Pain Loc --      Pain Edu? --      Excl. in [place]? --    Vitals:   09/10/19 1034 09/10/19 1037  BP: (!) 146/97   Pulse: (!) 115 (!) 106  Resp: 16   Temp: 97.6 F (36.4 C)   SpO2: 100%    Physical Exam Vitals and nursing note reviewed.  Constitutional:      General: She is not in acute distress.    Appearance: She is well-developed.  HENT:     Head: Normocephalic and atraumatic.     Right Ear: External ear normal.     Left Ear: External ear normal.     Nose: Nose normal.     Mouth/Throat:     Mouth: Mucous membranes are moist.  Eyes:     Conjunctiva/sclera: Conjunctivae normal.  Cardiovascular:     Rate and Rhythm: Normal rate and regular rhythm.     Heart sounds: No murmur heard.   Pulmonary:     Effort: Pulmonary effort is normal. No respiratory distress.     Breath sounds: Normal breath sounds.  Abdominal:     Palpations: Abdomen is soft.     Tenderness: There is no abdominal tenderness.  Musculoskeletal:     Cervical back: Neck supple.  Skin:    General: Skin is warm and dry.  Neurological:     Mental Status: She is alert and oriented to person, place, and time.  Psychiatric:        Mood and Affect: Mood normal.     2+ bilateral radial pulses.  Patient has sensation in the distribution of the radial ulnar and median nerves in the left upper extremity.  She has full range of motion at the left shoulder, elbow, and wrist.  There is no effusion or deformity of the left shoulder or elbow there are no significant streaking, erythema, edema, or other overlying skin changes  over the site of the Nexplanon which is palpable.  There is mild tenderness in the area.  No fluctuance.  No breaks in the skin. ____________________________________________   LABS (all labs ordered are listed, but only abnormal results are displayed)  Labs Reviewed - No data to display ____________________________________________  ____________________________________________  RADIOLOGY   Official radiology report(s): US Venous Img Upper Uni Left (DVT)  Result Date: 09/10/2019 CLINICAL DATA:  Left upper extremity pain EXAM:  LEFT UPPER EXTREMITY VENOUS DUPLEX ULTRASOUND TECHNIQUE: Gray-scale sonography with graded compression, as well as color Doppler and duplex ultrasound were performed to evaluate the left upper extremity deep venous system from the level of the subclavian vein and including the jugular, axillary, basilic, radial, ulnar and upper cephalic vein. Spectral Doppler was utilized to evaluate flow at rest and with distal augmentation maneuvers. COMPARISON:  None. FINDINGS: Contralateral Subclavian Vein: Respiratory phasicity is normal and symmetric with the symptomatic side. No evidence of thrombus. Normal compressibility. Internal Jugular Vein: No evidence of thrombus. Normal compressibility, respiratory phasicity and response to augmentation. Subclavian Vein: No evidence of thrombus. Normal compressibility, respiratory phasicity and response to augmentation. Axillary Vein: No evidence of thrombus. Normal compressibility, respiratory phasicity and response to augmentation. Cephalic Vein: No evidence of thrombus. Normal compressibility, respiratory phasicity and response to augmentation. Basilic Vein: No evidence of thrombus. Normal compressibility, respiratory phasicity and response to augmentation. Brachial Veins: No evidence of thrombus. Normal compressibility, respiratory phasicity and response to augmentation. 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 Radial Veins: No evidence of thrombus. Normal compressibility, respiratory phasicity and response to augmentation. Ulnar Veins: No evidence of thrombus. Normal compressibility, respiratory phasicity and response to augmentation. Venous Reflux:  None visualized. Other Findings: None visualized. No lesions seen by ultrasound at site of pain in the left upper extremity in particular. IMPRESSION: No evidence of deep venous thrombosis in the left upper extremity. Right subclavian vein also patent. Electronically Signed   By: Lowella Grip III M.D.   On: 09/10/2019 14:23   DG Humerus Left  Result Date: 09/10/2019 CLINICAL DATA:  Left upper arm pain for 2 days. The patient has a birth control implant in the left upper arm which was placed 2 years ago. No known injury. EXAM: LEFT HUMERUS - 2+ VIEW COMPARISON:  None. FINDINGS: Birth control implant is seen in the subcutaneous fatty tissues of the left upper arm centered approximately 9 cm above the medial epicondyle of the humerus. No soft tissue gas is identified. Imaged bones and joints appear normal. IMPRESSION: No acute abnormality. Birth control implant is noted as described above. Electronically Signed   By: Inge Rise M.D.   On: 09/10/2019 12:21   [place] LT UPPER EXTREM LTD SOFT TISSUE NON VASCULAR  Result Date: 09/10/2019 CLINICAL DATA:  Left arm pain for 2 days. EXAM: ULTRASOUND LEFT UPPER EXTREMITY LIMITED TECHNIQUE: Ultrasound examination of the upper extremity soft tissues was performed in the area of clinical concern. COMPARISON:  None. FINDINGS: Real-time sonography of the left upper arm was performed. Nexplanon implant is noted 2 cm beneath the skin surface. No surrounding soft tissue mass, fluid collection or hematoma. No architectural distortion. IMPRESSION: Nexplanon implant is noted 2 cm beneath the skin surface. No surrounding soft tissue mass, fluid collection or hematoma. Electronically Signed   By: Kathreen Devoid   On: 09/10/2019  13:48    ____________________________________________   PROCEDURES  Procedure(s) performed (including Critical Care):  Procedures   ____________________________________________   INITIAL IMPRESSION / ASSESSMENT AND PLAN / ED COURSE        Patient presents with above-stated history exam for evaluation approximately 1 to 2 days of soreness around the site of her Nexplanon.  Afebrile hemodynamically stable on arrival.  Exam as above.  Overall patient history, exam, and ED work-up is not consistent with neurovascular injury, fracture, abscess, cellulitis, DVT, or other clear life or limb threatening etiology.  Certainly possible patient sustained a mild  contusion she states she rolls around vigorously while sleeping versus other unclear etiology.  Patient was given the limited analgesia instructed to follow-up with her OB/GYN in next 3 to 5 days for follow-up.  Patient discharged stable condition.  Strict return precautions advised and discussed.  Medications  naproxen (NAPROSYN) tablet 500 mg (500 mg Oral Given 09/10/19 1407)             ____________________________________________   FINAL CLINICAL IMPRESSION(S) / ED DIAGNOSES  Final diagnoses:  Arm pain, anterior, left  Left arm pain  Hypertension, unspecified type     ED Discharge Orders    None       Note:  This document was prepared using Dragon voice recognition software and may include unintentional dictation errors.   Lucrezia Starch, MD 09/10/19 [phone]

## 2019-09-10 NOTE — ED Notes (Signed)
First nurse note  Presents with pain to left upper arm  States she is having pain in area of her birth control implant

## 2019-09-10 NOTE — ED Triage Notes (Signed)
Patient presents to the ED with left arm pain at site of birth control implant x 2 days.  Patient states implant was placed 1 year ago.  Patient states she would like it taken out.

## 2019-11-01 ENCOUNTER — Emergency Department: Payer: Self-pay

## 2019-11-01 ENCOUNTER — Other Ambulatory Visit: Payer: Self-pay

## 2019-11-01 ENCOUNTER — Inpatient Hospital Stay
Admission: EM | Admit: 2019-11-01 | Discharge: 2019-11-04 | DRG: 872 | Disposition: A | Discharge status: Home / Self Care | Payer: Self-pay | Attending: Hospitalist | Admitting: Hospitalist

## 2019-11-01 DIAGNOSIS — N12 Tubulo-interstitial nephritis, not specified as acute or chronic: Secondary | ICD-10-CM

## 2019-11-01 DIAGNOSIS — Z9114 Patient's other noncompliance with medication regimen: Secondary | ICD-10-CM

## 2019-11-01 DIAGNOSIS — A419 Sepsis, unspecified organism: Principal | ICD-10-CM | POA: Diagnosis present

## 2019-11-01 DIAGNOSIS — B9620 Unspecified Escherichia coli [E. coli] as the cause of diseases classified elsewhere: Secondary | ICD-10-CM | POA: Diagnosis present

## 2019-11-01 DIAGNOSIS — Z23 Encounter for immunization: Secondary | ICD-10-CM

## 2019-11-01 DIAGNOSIS — Z20822 Contact with and (suspected) exposure to covid-19: Secondary | ICD-10-CM | POA: Diagnosis present

## 2019-11-01 DIAGNOSIS — E109 Type 1 diabetes mellitus without complications: Secondary | ICD-10-CM | POA: Diagnosis present

## 2019-11-01 DIAGNOSIS — Z794 Long term (current) use of insulin: Secondary | ICD-10-CM

## 2019-11-01 DIAGNOSIS — N10 Acute tubulo-interstitial nephritis: Secondary | ICD-10-CM | POA: Diagnosis present

## 2019-11-01 DIAGNOSIS — E1065 Type 1 diabetes mellitus with hyperglycemia: Secondary | ICD-10-CM | POA: Diagnosis present

## 2019-11-01 LAB — HIV ANTIBODY (ROUTINE TESTING W REFLEX): HIV Screen 4th Generation wRfx: NONREACTIVE

## 2019-11-01 LAB — RESPIRATORY PANEL BY RT PCR (FLU A&B, COVID)
Influenza A by PCR: NEGATIVE
Influenza B by PCR: NEGATIVE
SARS Coronavirus 2 by RT PCR: NEGATIVE

## 2019-11-01 LAB — URINALYSIS, COMPLETE (UACMP) WITH MICROSCOPIC
Bilirubin Urine: NEGATIVE
Glucose, UA: >=500 mg/dL — AB
Ketones, ur: 20 mg/dL — AB
Nitrite: POSITIVE — AB
Protein, ur: 30 mg/dL — AB
Specific Gravity, Urine: 1.027 (ref 1.005–1.030)
WBC, UA: >50 WBC/hpf — ABNORMAL HIGH (ref 0–5)
pH: 5 (ref 5.0–8.0)

## 2019-11-01 LAB — COMPREHENSIVE METABOLIC PANEL
ALT: 13 U/L (ref 0–44)
AST: 12 U/L — ABNORMAL LOW (ref 15–41)
Albumin: 4.1 g/dL (ref 3.5–5.0)
Alkaline Phosphatase: 82 U/L (ref 38–126)
Anion gap: 11 (ref 5–15)
BUN: 7 mg/dL (ref 6–20)
CO2: 22 mmol/L (ref 22–32)
Calcium: 9 mg/dL (ref 8.9–10.3)
Chloride: 103 mmol/L (ref 98–111)
Creatinine, Ser: 0.51 mg/dL (ref 0.44–1.00)
GFR calc Af Amer: >60 mL/min (ref >60)
GFR calc non Af Amer: >60 mL/min (ref >60)
Glucose, Bld: 279 mg/dL — ABNORMAL HIGH (ref 70–99)
Potassium: 3.5 mmol/L (ref 3.5–5.1)
Sodium: 136 mmol/L (ref 135–145)
Total Bilirubin: 0.7 mg/dL (ref 0.3–1.2)
Total Protein: 8.2 g/dL — ABNORMAL HIGH (ref 6.5–8.1)

## 2019-11-01 LAB — CBC
HCT: 36.6 % (ref 36.0–46.0)
Hemoglobin: 12.4 g/dL (ref 12.0–15.0)
MCH: 31.2 pg (ref 26.0–34.0)
MCHC: 33.9 g/dL (ref 30.0–36.0)
MCV: 92 fL (ref 80.0–100.0)
Platelets: 266 10*3/uL (ref 150–400)
RBC: 3.98 MIL/uL (ref 3.87–5.11)
RDW: 12.7 % (ref 11.5–15.5)
WBC: 23.5 10*3/uL — ABNORMAL HIGH (ref 4.0–10.5)
nRBC: 0 % (ref 0.0–0.2)

## 2019-11-01 LAB — POCT PREGNANCY, URINE: Preg Test, Ur: NEGATIVE

## 2019-11-01 LAB — HEMOGLOBIN A1C
Hgb A1c MFr Bld: 12.7 % — ABNORMAL HIGH (ref 4.8–5.6)
Mean Plasma Glucose: 317.79 mg/dL

## 2019-11-01 LAB — GLUCOSE, CAPILLARY
Glucose-Capillary: 255 mg/dL — ABNORMAL HIGH (ref 70–99)
Glucose-Capillary: 164 mg/dL — ABNORMAL HIGH (ref 70–99)
Glucose-Capillary: 175 mg/dL — ABNORMAL HIGH (ref 70–99)
Glucose-Capillary: 219 mg/dL — ABNORMAL HIGH (ref 70–99)

## 2019-11-01 LAB — LACTIC ACID, PLASMA: Lactic Acid, Venous: 1.8 mmol/L (ref 0.5–1.9)

## 2019-11-01 LAB — LIPASE, BLOOD: Lipase: 29 U/L (ref 11–51)

## 2019-11-01 MED ORDER — SODIUM CHLORIDE 0.9 % IV SOLN
1.0000 g | Freq: Once | INTRAVENOUS | Status: AC
  Administered 2019-11-01: 1 g via INTRAVENOUS
  Filled 2019-11-01: qty 10

## 2019-11-01 MED ORDER — ONDANSETRON HCL 4 MG/2ML IJ SOLN: 4.0000 mg | Freq: Four times a day (QID) | INTRAMUSCULAR | Status: DC | PRN

## 2019-11-01 MED ORDER — SODIUM CHLORIDE 0.9 % IV SOLN: Freq: Once | INTRAVENOUS | Status: AC

## 2019-11-01 MED ORDER — KETOROLAC TROMETHAMINE 30 MG/ML IJ SOLN
15.0000 mg | Freq: Once | INTRAMUSCULAR | Status: AC
  Administered 2019-11-01: 15 mg via INTRAVENOUS
  Filled 2019-11-01: qty 1

## 2019-11-01 MED ORDER — ENOXAPARIN SODIUM 40 MG/0.4ML ~~LOC~~ SOLN
40.0000 mg | SUBCUTANEOUS | Status: DC
  Administered 2019-11-01 – 2019-11-03 (×3): 40 mg via SUBCUTANEOUS
  Filled 2019-11-01 (×3): qty 0.4

## 2019-11-01 MED ORDER — SODIUM CHLORIDE 0.9 % IV BOLUS
1000.0000 mL | Freq: Once | INTRAVENOUS | Status: AC
  Administered 2019-11-01: 1000 mL via INTRAVENOUS

## 2019-11-01 MED ORDER — INSULIN DETEMIR 100 UNIT/ML ~~LOC~~ SOLN
10.0000 [IU] | Freq: Every day | SUBCUTANEOUS | Status: DC
  Administered 2019-11-01 – 2019-11-03 (×3): 10 [IU] via SUBCUTANEOUS
  Filled 2019-11-01 (×4): qty 0.1

## 2019-11-01 MED ORDER — INSULIN ASPART 100 UNIT/ML ~~LOC~~ SOLN
0.0000 [IU] | Freq: Three times a day (TID) | SUBCUTANEOUS | Status: DC
  Administered 2019-11-01 – 2019-11-03 (×4): 3 [IU] via SUBCUTANEOUS
  Filled 2019-11-01 (×4): qty 1

## 2019-11-01 MED ORDER — SODIUM CHLORIDE 0.9 % IV SOLN
1.0000 g | INTRAVENOUS | Status: DC
  Administered 2019-11-01 – 2019-11-03 (×3): 1 g via INTRAVENOUS
  Filled 2019-11-01: qty 1
  Filled 2019-11-01: qty 10
  Filled 2019-11-01: qty 1
  Filled 2019-11-01: qty 10

## 2019-11-01 MED ORDER — MORPHINE SULFATE (PF) 4 MG/ML IV SOLN
4.0000 mg | INTRAVENOUS | Status: DC | PRN
  Administered 2019-11-01 – 2019-11-02 (×2): 4 mg via INTRAVENOUS
  Filled 2019-11-01 (×2): qty 1

## 2019-11-01 MED ORDER — METOCLOPRAMIDE HCL 5 MG/ML IJ SOLN
10.0000 mg | Freq: Once | INTRAMUSCULAR | Status: AC
  Administered 2019-11-01: 10 mg via INTRAVENOUS
  Filled 2019-11-01: qty 2

## 2019-11-01 MED ORDER — ONDANSETRON HCL 4 MG PO TABS: 4.0000 mg | ORAL_TABLET | Freq: Four times a day (QID) | ORAL | Status: DC | PRN

## 2019-11-01 MED ORDER — ACETAMINOPHEN 325 MG PO TABS: 650.0000 mg | ORAL_TABLET | Freq: Four times a day (QID) | ORAL | Status: DC | PRN

## 2019-11-01 MED ORDER — METOCLOPRAMIDE HCL 5 MG/ML IJ SOLN
10.0000 mg | Freq: Four times a day (QID) | INTRAMUSCULAR | Status: DC | PRN
  Administered 2019-11-01: 10 mg via INTRAVENOUS
  Filled 2019-11-01: qty 2

## 2019-11-01 MED ORDER — INSULIN ASPART 100 UNIT/ML ~~LOC~~ SOLN
6.0000 [IU] | Freq: Once | SUBCUTANEOUS | Status: AC
  Administered 2019-11-01: 6 [IU] via INTRAVENOUS
  Filled 2019-11-01: qty 1

## 2019-11-01 MED ORDER — ONDANSETRON 4 MG PO TBDP
4.0000 mg | ORAL_TABLET | Freq: Once | ORAL | Status: AC | PRN
  Administered 2019-11-01: 4 mg via ORAL
  Filled 2019-11-01: qty 1

## 2019-11-01 MED ORDER — ACETAMINOPHEN 650 MG RE SUPP: 650.0000 mg | Freq: Four times a day (QID) | RECTAL | Status: DC | PRN

## 2019-11-01 MED ORDER — LACTATED RINGERS IV SOLN: INTRAVENOUS | Status: DC

## 2019-11-01 MED ORDER — IOHEXOL 300 MG/ML  SOLN
100.0000 mL | Freq: Once | INTRAMUSCULAR | Status: AC | PRN
  Administered 2019-11-01: 100 mL via INTRAVENOUS

## 2019-11-01 NOTE — H&P (Signed)
History and Physical    Mandy Patel ZOX:[id number] DOB: [birth year] DOA: 11/01/2019  PCP: Patient, No Pcp Per   Patient coming from: Home  I have personally briefly reviewed patient's old medical records in [place]  Chief Complaint: Abdominal pain  HPI: Mandy Patel is a 24 y.o. female with medical history significant for insulin-dependent diabetes mellitus who presents to the ER for evaluation of abdominal pain which she describes as mostly periumbilical pain.  She rates her pain a 6 x 10 in intensity at its worst and states that there is occasional radiation to her sides.  Pain is associated with nausea and vomiting.  She denies having any fever chills.  She has frequency of urination but denies having any dysuria or nocturia. She denies having any chest pain, no cough, no dizziness, no lightheadedness, no headache, no palpitations or any changes in her bowel habits. Labs show sodium 136, potassium 3.5, chloride 103, bicarb 22, glucose 279, BUN 7, creatinine 0.51, calcium 9.0, alkaline phosphatase 82, albumin 4.1, lipase 29, AST 12, ALT 13, total protein 8.2, lactic acid 1.8, white count 23.5, hemoglobin 12.4, hematocrit 36.6, MCV 92.0, RDW 12.7, platelet count 266 Patient's respiratory viral panel is negative CT scan of abdomen and pelvis shows pyelonephritis affecting the right kidney more than the left   ED Course: Patient is a 24 year old insulin-dependent diabetic who presents to the emergency room for evaluation of periumbilical abdominal pain with radiation to the flank.  While in the ER patient spiked a fever with a T-max of 100F, she was tachycardic, she has a white count of 23,000 and significant pyuria.  She received sepsis IV fluids in the emergency room and will be admitted to the hospital for further evaluation.  Review of Systems: As per HPI otherwise 10 point review of systems negative.    Past Medical History:  Diagnosis Date  . Diabetes mellitus    type  I, diagnosed age 38yo  . Pyelonephritis 03/2017    Past Surgical History:  Procedure Laterality Date  . EYE MUSCLE SURGERY  approx 2010   bilat eye surgury, left exotropia worse;Dr Annamaria Boots, opthomology     reports that she has never smoked. She has never used smokeless tobacco. She reports current drug use. Drug: Marijuana. She reports that she does not drink alcohol.  Allergies  Allergen Reactions  . Peanut Oil Other (See Comments)    Reaction to peanuts - gums feel numb and tingling  . Other Swelling    Pecans caused throat swelling and weakness    Family History  Problem Relation Age of Onset  . Alcohol abuse Other   . Arthritis Other   . Hyperlipidemia Other   . Stroke Other   . Diabetes Other   . Hyperlipidemia Other   . Hypertension Other   . Diabetes Other      Prior to Admission medications   Medication Sig Start Date End Date Taking? Authorizing Provider  insulin detemir (LEVEMIR) 100 UNIT/ML injection Inject 0.2 mLs (20 Units total) into the skin at bedtime. 06/19/18  Yes Shah, Pratik D, DO  insulin lispro (HUMALOG) 100 UNIT/ML injection Inject 0.04 mLs (4 Units total) into the skin 3 (three) times daily with meals. 06/01/18  Yes Bettey Costa, MD  blood glucose meter kit and supplies KIT Dispense based on patient and insurance preference. Use up to four times daily as directed. (FOR ICD-9 250.00, 250.01). 06/01/18   Bettey Costa, MD  etonogestrel (NEXPLANON) 74 MG IMPL implant  1 each by Subdermal route once. **Implanted Sept 2016**    [provider]    Physical Exam: Vitals:   11/01/19 0714 11/01/19 0943 11/01/19 1231 11/01/19 1400  BP:  (!) 144/87 (!) 139/91 129/84  Pulse:  (!) 110 (!) 130 (!) 115  Resp:  18 20 18   Temp:   98.5 F (36.9 C)   TempSrc:   Oral   SpO2:  100% 99% 100%  Weight: 74.8 kg     Height: 5' 8"  (1.727 m)        Vitals:   11/01/19 0714 11/01/19 0943 11/01/19 1231 11/01/19 1400  BP:  (!) 144/87 (!) 139/91 129/84  Pulse:  (!)  110 (!) 130 (!) 115  Resp:  18 20 18   Temp:   98.5 F (36.9 C)   TempSrc:   Oral   SpO2:  100% 99% 100%  Weight: 74.8 kg     Height: 5' 8"  (1.727 m)       Constitutional: NAD, alert and oriented x 3.  Acutely ill-appearing Eyes: PERRL, lids and conjunctivae normal ENMT: Mucous membranes are moist.  Neck: normal, supple, no masses, no thyromegaly Respiratory: clear to auscultation bilaterally, no wheezing, no crackles. Normal respiratory effort. No accessory muscle use.  Cardiovascular: Tachycardic, no murmurs / rubs / gallops. No extremity edema. 2+ pedal pulses. No carotid bruits.  Abdomen: Periumbilical tenderness, no CVA tenderness. No hepatosplenomegaly. Bowel sounds positive.  Musculoskeletal: no clubbing / cyanosis. No joint deformity upper and lower extremities.  Skin: no rashes, lesions, ulcers.  Neurologic: No gross focal neurologic deficit. Psychiatric: Normal mood and affect.   Labs on Admission: I have personally reviewed following labs and imaging studies  CBC: Recent Labs  Lab 11/01/19 0726  WBC 23.5*  HGB 12.4  HCT 36.6  MCV 92.0  PLT 798   Basic Metabolic Panel: Recent Labs  Lab 11/01/19 0726  NA 136  K 3.5  CL 103  CO2 22  GLUCOSE 279*  BUN 7  CREATININE 0.51  CALCIUM 9.0   GFR: Estimated Creatinine Clearance: 110.3 mL/min (by C-G formula based on SCr of 0.51 mg/dL). Liver Function Tests: Recent Labs  Lab 11/01/19 0726  AST 12*  ALT 13  ALKPHOS 82  BILITOT 0.7  PROT 8.2*  ALBUMIN 4.1   Recent Labs  Lab 11/01/19 0726  LIPASE 29   No results for input(s): AMMONIA in the last 168 hours. Coagulation Profile: No results for input(s): INR, PROTIME in the last 168 hours. Cardiac Enzymes: No results for input(s): CKTOTAL, CKMB, CKMBINDEX, TROPONINI in the last 168 hours. BNP (last 3 results) No results for input(s): PROBNP in the last 8760 hours. HbA1C: No results for input(s): HGBA1C in the last 72 hours. CBG: Recent Labs  Lab  11/01/19 0723 11/01/19 1155  GLUCAP 255* 164*   Lipid Profile: No results for input(s): CHOL, HDL, LDLCALC, TRIG, CHOLHDL, LDLDIRECT in the last 72 hours. Thyroid Function Tests: No results for input(s): TSH, T4TOTAL, FREET4, T3FREE, THYROIDAB in the last 72 hours. Anemia Panel: No results for input(s): VITAMINB12, FOLATE, FERRITIN, TIBC, IRON, RETICCTPCT in the last 72 hours. Urine analysis:    Component Value Date/Time   COLORURINE YELLOW (A) 11/01/2019 0726   APPEARANCEUR CLOUDY (A) 11/01/2019 0726   LABSPEC 1.027 11/01/2019 0726   PHURINE 5.0 11/01/2019 0726   GLUCOSEU >=500 (A) 11/01/2019 0726   GLUCOSEU >=1000 12/01/2010 1051   HGBUR MODERATE (A) 11/01/2019 0726   BILIRUBINUR NEGATIVE 11/01/2019 0726   KETONESUR 20 (A)  11/01/2019 0726   PROTEINUR 30 (A) 11/01/2019 0726   UROBILINOGEN 0.2 12/01/2010 1051   NITRITE POSITIVE (A) 11/01/2019 0726   LEUKOCYTESUR SMALL (A) 11/01/2019 0726    Radiological Exams on Admission: CT ABDOMEN PELVIS W CONTRAST  Result Date: 11/01/2019 CLINICAL DATA:  Acute nonlocalized abdominal pain.  Type 1 diabetes EXAM: CT ABDOMEN AND PELVIS WITH CONTRAST TECHNIQUE: Multidetector CT imaging of the abdomen and pelvis was performed using the standard protocol following bolus administration of intravenous contrast. CONTRAST:  131m OMNIPAQUE IOHEXOL 300 MG/ML  SOLN COMPARISON:  05/31/2018 noncontrast CT FINDINGS: Lower chest:  No contributory findings. Hepatobiliary: No focal liver abnormality.No evidence of biliary obstruction or stone. Pancreas: Unremarkable. Spleen: Unremarkable. Adrenals/Urinary Tract: Negative adrenals. Striated nephrogram at the upper right and likely upper left renal poles. Prominent calices on both sides without ballooning, question prior renal papillary necrosis. Partial duplication of the right ureters. Prominent bladder wall thickness without visible debris or over distention. Stomach/Bowel:  No obstruction. No appendicitis.  Vascular/Lymphatic: No acute vascular abnormality. No mass or adenopathy. Reproductive:No pathologic findings. Other: No ascites or pneumoperitoneum. Musculoskeletal: No acute abnormalities. IMPRESSION: Pyelonephritis findings affecting the right more than left kidney. No abscess or hydronephrosis. Electronically Signed   By: JMonte FantasiaM.D.   On: 11/01/2019 10:10    EKG: Independently reviewed.   Assessment/Plan Principal Problem:   Sepsis ([place] Active Problems:   Acute pyelonephritis   Type 1 diabetes mellitus without complication (HCC)    Sepsis (POA) As evidenced by fever with a T-max of 100.66F, tachycardia, marked leukocytosis with a left shift and pyuria Source of sepsis appears to be pyelonephritis Patient has a history of E. coli bacteremia Aggressive IV fluid resuscitation Place patient on IV Rocephin 1 g daily until blood and urine culture results become available   Acute pyelonephritis Treatment as outlined in #1    Diabetes mellitus Insulin-dependent We will administer one half of patient scheduled long-acting insulin due to poor oral intake related to nausea and vomiting We will place patient on sliding scale coverage Start patient on clear liquid diet and advance as tolerated   DVT prophylaxis: Lovenox Code Status: Full code Family Communication: Greater than 50% of time was spent discussing plan of care with patient at the bedside.  All questions and concerns have been addressed.  She verbalizes understanding and is with the plan Disposition Plan: Back to previous home environment Consults called: None    Yareliz Thorstenson MD [facility]     11/01/2019, 2:27 PM

## 2019-11-01 NOTE — ED Notes (Signed)
Pt given a cup of ice water and ginger ale at this time and encouraged to drink.

## 2019-11-01 NOTE — ED Notes (Signed)
Lab called to request blood culture draw for second set, MD aware patient received antibiotics prior to blood culture collection

## 2019-11-01 NOTE — ED Provider Notes (Signed)
[facility] Emergency Department Provider Note    First MD Initiated Contact with Patient 11/01/19 [phone]     (approximate)  I have reviewed the triage vital signs and the nursing notes.   HISTORY  Chief Complaint Abdominal Pain    HPI Mandy Patel is a 24 y.o. female below listed past medical history presents to the ER for evaluation of several days of nausea vomiting worsening generalized abdominal pain.  She is type I diabetic.  Has been having trouble keeping anything down but did tolerate liquids this morning.  Denies any dysuria.  No vaginal discharge.  Is having some pain radiating to her flank.  No recent antibiotics.    Past Medical History:  Diagnosis Date  . Diabetes mellitus    type I, diagnosed age 2yo  . Pyelonephritis 03/2017   Family History  Problem Relation Age of Onset  . Alcohol abuse Other   . Arthritis Other   . Hyperlipidemia Other   . Stroke Other   . Diabetes Other   . Hyperlipidemia Other   . Hypertension Other   . Diabetes Other    Past Surgical History:  Procedure Laterality Date  . EYE MUSCLE SURGERY  approx 2010   bilat eye surgury, left exotropia worse;Dr Annamaria Boots, opthomology   Patient Active Problem List   Diagnosis Date Noted  . DKA, type 1 ([place]) 06/16/2018  . Hyperkalemia 06/16/2018  . Acute renal failure (ARF) ([place]) 06/16/2018  . Elevated liver enzymes 06/16/2018  . Sepsis ([place]) 09/04/2017  . Type 1 diabetes mellitus with hyperglycemia ([place]) 03/15/2017  . Acute pyelonephritis 03/15/2017  . Acute right flank pain 03/15/2017  . Influenza A 03/06/2017  . Nausea and vomiting 03/06/2017  . DKA (diabetic ketoacidoses) ([place]) 06/12/2015  . Type I (juvenile type) diabetes mellitus without mention of complication, uncontrolled 04/26/2011  . Preventative health care 12/01/2010  . Obesity 06/19/2010  . Goiter, unspecified 06/19/2010  . HYPERCHOLESTEROLEMIA 04/25/2008      Prior to Admission medications     Medication Sig Start Date End Date Taking? Authorizing Provider  blood glucose meter kit and supplies KIT Dispense based on patient and insurance preference. Use up to four times daily as directed. (FOR ICD-9 250.00, 250.01). 06/01/18   Bettey Costa, MD  etonogestrel (NEXPLANON) 68 MG IMPL implant 1 each by Subdermal route once. **Implanted Sept 2016**    [provider]  insulin detemir (LEVEMIR) 100 UNIT/ML injection Inject 0.2 mLs (20 Units total) into the skin at bedtime. 06/19/18   Manuella Ghazi, Pratik D, DO  insulin lispro (HUMALOG) 100 UNIT/ML injection Inject 0.04 mLs (4 Units total) into the skin 3 (three) times daily with meals. 06/01/18   Bettey Costa, MD    Allergies Peanut oil and Other    Social History Social History   Tobacco Use  . Smoking status: Never Smoker  . Smokeless tobacco: Never Used  Vaping Use  . Vaping Use: Never used  Substance Use Topics  . Alcohol use: No  . Drug use: Yes    Types: Marijuana    Review of Systems Patient denies headaches, rhinorrhea, blurry vision, numbness, shortness of breath, chest pain, edema, cough, abdominal pain, nausea, vomiting, diarrhea, dysuria, fevers, rashes or hallucinations unless otherwise stated above in HPI. ____________________________________________   PHYSICAL EXAM:  VITAL SIGNS: Vitals:   11/01/19 0943 11/01/19 1231  BP: (!) 144/87 (!) 139/91  Pulse: (!) 110 (!) 130  Resp: 18 20  Temp:  98.5 F (36.9 C)  SpO2: 100% 99%    Constitutional: Alert and oriented.  Eyes: Conjunctivae are normal.  Head: Atraumatic. Nose: No congestion/rhinnorhea. Mouth/Throat: Mucous membranes are moist.   Neck: No stridor. Painless ROM.  Cardiovascular: Normal rate, regular rhythm. Grossly normal heart sounds.  Good peripheral circulation. Respiratory: Normal respiratory effort.  No retractions. Lungs CTAB. Gastrointestinal: Soft and with generalized ttp, no rebound or guarding. No distention. No abdominal bruits. No CVA  tenderness. Genitourinary:  Musculoskeletal: No lower extremity tenderness nor edema.  No joint effusions. Neurologic:  Normal speech and language. No gross focal neurologic deficits are appreciated. No facial droop Skin:  Skin is warm, dry and intact. No rash noted. Psychiatric: Mood and affect are normal. Speech and behavior are normal.  ____________________________________________   LABS (all labs ordered are listed, but only abnormal results are displayed)  Results for orders placed or performed during the hospital encounter of 11/01/19 (from the past 24 hour(s))  Glucose, capillary     Status: Abnormal   Collection Time: 11/01/19  7:23 AM  Result Value Ref Range   Glucose-Capillary 255 (H) 70 - 99 mg/dL  Lipase, blood     Status: None   Collection Time: 11/01/19  7:26 AM  Result Value Ref Range   Lipase 29 11 - 51 U/L  Comprehensive metabolic panel     Status: Abnormal   Collection Time: 11/01/19  7:26 AM  Result Value Ref Range   Sodium 136 135 - 145 mmol/L   Potassium 3.5 3.5 - 5.1 mmol/L   Chloride 103 98 - 111 mmol/L   CO2 22 22 - 32 mmol/L   Glucose, Bld 279 (H) 70 - 99 mg/dL   BUN 7 6 - 20 mg/dL   Creatinine, Ser 0.51 0.44 - 1.00 mg/dL   Calcium 9.0 8.9 - 10.3 mg/dL   Total Protein 8.2 (H) 6.5 - 8.1 g/dL   Albumin 4.1 3.5 - 5.0 g/dL   AST 12 (L) 15 - 41 U/L   ALT 13 0 - 44 U/L   Alkaline Phosphatase 82 38 - 126 U/L   Total Bilirubin 0.7 0.3 - 1.2 mg/dL   GFR calc non Af Amer >60 >60 mL/min   GFR calc Af Amer >60 >60 mL/min   Anion gap 11 5 - 15  CBC     Status: Abnormal   Collection Time: 11/01/19  7:26 AM  Result Value Ref Range   WBC 23.5 (H) 4.0 - 10.5 K/uL   RBC 3.98 3.87 - 5.11 MIL/uL   Hemoglobin 12.4 12.0 - 15.0 g/dL   HCT 36.6 36 - 46 %   MCV 92.0 80.0 - 100.0 fL   MCH 31.2 26.0 - 34.0 pg   MCHC 33.9 30.0 - 36.0 g/dL   RDW 12.7 11.5 - 15.5 %   Platelets 266 150 - 400 K/uL   nRBC 0.0 0.0 - 0.2 %  Urinalysis, Complete w Microscopic     Status:  Abnormal   Collection Time: 11/01/19  7:26 AM  Result Value Ref Range   Color, Urine YELLOW (A) YELLOW   APPearance CLOUDY (A) CLEAR   Specific Gravity, Urine 1.027 1.005 - 1.030   pH 5.0 5.0 - 8.0   Glucose, UA >=500 (A) NEGATIVE mg/dL   Hgb urine dipstick MODERATE (A) NEGATIVE   Bilirubin Urine NEGATIVE NEGATIVE   Ketones, ur 20 (A) NEGATIVE mg/dL   Protein, ur 30 (A) NEGATIVE mg/dL   Nitrite POSITIVE (A) NEGATIVE   Leukocytes,Ua SMALL (A) NEGATIVE   RBC /  HPF 6-10 0 - 5 RBC/hpf   WBC, UA >50 (H) 0 - 5 WBC/hpf   Bacteria, UA MANY (A) NONE SEEN   Squamous Epithelial / LPF 0-5 0 - 5   Mucus PRESENT   Pregnancy, urine POC     Status: None   Collection Time: 11/01/19  7:34 AM  Result Value Ref Range   Preg Test, Ur NEGATIVE NEGATIVE  Glucose, capillary     Status: Abnormal   Collection Time: 11/01/19 11:55 AM  Result Value Ref Range   Glucose-Capillary 164 (H) 70 - 99 mg/dL   ____________________________________________ ____________________________________________  RADIOLOGY  I personally reviewed all radiographic images ordered to evaluate for the above acute complaints and reviewed radiology reports and findings.  These findings were personally discussed with the patient.  Please see medical record for radiology report.  ____________________________________________   PROCEDURES  Procedure(s) performed:  Procedures    Critical Care performed: no ____________________________________________   INITIAL IMPRESSION / ASSESSMENT AND PLAN / ED COURSE  Pertinent labs & imaging results that were available during my care of the patient were reviewed by me and considered in my medical decision making (see chart for details).   DDX: uti, pyelo, enteritis, colitis, diverticulitis, appendicitis  Mandy Patel is a 24 y.o. who presents to the ED with presentation as described above.  Patient nontoxic-appearing but does have significant leukocytosis signs of pyuria but not  describing any symptoms consistent with UTI.  Does have a history of pyelonephritis but no CVA tenderness.  Given her generalized abdominal pain will order CT imaging of the abdomen for the but differential.  Will provide IV narcotic medication IV antiemetics and IV fluids.  Clinical Course as of Oct 31 1256  Thu Nov 01, 2019  1212 Patient with persistent sirs criteria despite IV fluids IV antibiotics.  Still feel abdominal discomfort and nausea.  Blood sugar is improving no anion gap to suggest DKA.  On review of her medical records it does appear that she is had bacteremia in the past and she does appear to be poorly controlled diabetic do feel that she will require observation for her SIRS (tachycardia, oral temp 100, leukocytosis) criteria in the setting of pyelonephritis.  Have discussed with the patient and available family all diagnostics and treatments performed thus far and all questions were answered to the best of my ability. The patient demonstrates understanding and agreement with plan.    [PR]    Clinical Course User Index [PR] Merlyn Lot, MD    The patient was evaluated in Emergency Department today for the symptoms described in the history of present illness. He/she was evaluated in the context of the global COVID-19 pandemic, which necessitated consideration that the patient might be at risk for infection with the SARS-CoV-2 virus that causes COVID-19. Institutional protocols and algorithms that pertain to the evaluation of patients at risk for COVID-19 are in a state of rapid change based on information released by regulatory bodies including the CDC and federal and state organizations. These policies and algorithms were followed during the patient's care in the ED.  As part of my medical decision making, I reviewed the following data within the [place] notes reviewed and incorporated, Labs reviewed, notes from prior ED visits and [place] Controlled  Substance Database   ____________________________________________   FINAL CLINICAL IMPRESSION(S) / ED DIAGNOSES  Final diagnoses:  Pyelonephritis      NEW MEDICATIONS STARTED DURING THIS VISIT:  New Prescriptions   No  medications on file     Note:  This document was prepared using Dragon voice recognition software and may include unintentional dictation errors.    Merlyn Lot, MD 11/01/19 1259

## 2019-11-01 NOTE — ED Triage Notes (Addendum)
Pt arrived via POV with reports of abdominal pain x 3 days along with vomiting. Pt also has type 1 diabetes.  Pt denies any diarrhea.  Pt able to drink some water and keep it down.

## 2019-11-01 NOTE — ED Notes (Signed)
Pt states coming in for nausea, vomiting and abdominal pain. Pt states she does not have pain now, but has some nausea. Pt is alert and oriented and resting in room.

## 2019-11-01 NOTE — Progress Notes (Signed)
Inpatient Diabetes Program Recommendations  AACE/ADA: New Consensus Statement on Inpatient Glycemic Control (2015)  Target Ranges:  Prepandial:   less than 140 mg/dL      Peak postprandial:   less than 180 mg/dL (1-2 hours)      Critically ill patients:  140 - 180 mg/dL   Lab Results  Component Value Date   GLUCAP 164 (H) 11/01/2019   HGBA1C 13.0 (H) 06/01/2018    Review of Glycemic Control Results for Mandy Patel, Mandy Patel (MRN [id number]) as of 11/01/2019 13:31  Ref. Range 11/01/2019 07:23 11/01/2019 11:55  Glucose-Capillary Latest Ref Range: 70 - 99 mg/dL 209 (H) 470 (H)   Diabetes history: DM1 Outpatient Diabetes medications: Levemir 20 units qd + Humalog 4 units tid meal coverage Current orders for Inpatient glycemic control: Novolog moderate correction 0-15 units tid  Inpatient Diabetes Program Recommendations:   Patient shared with RN [name] Register that she took her last dose of Levemir @ 8:30 last pm. -Add 80% home Levemir dose = 16 units daily -Add Novolog 4 units tid meal coverage when eating 50% meal -Decrease Novolog correction to sensitive 0-9 units tid + hs 0-5 units  DM coordinator spoke with patient on prior admission 06/16/19 regarding taking insulin as prescribed when patient was admitted in DKA. Will follow during hospitalization.  Thank you, Billy Fischer. Kanon Novosel, RN, MSN, CDE  Diabetes Coordinator Inpatient Glycemic Control Team Team Pager [phone] (8am-5pm) 11/01/2019 1:48 PM

## 2019-11-02 LAB — GLUCOSE, CAPILLARY
Glucose-Capillary: 172 mg/dL — ABNORMAL HIGH (ref 70–99)
Glucose-Capillary: 133 mg/dL — ABNORMAL HIGH (ref 70–99)
Glucose-Capillary: 153 mg/dL — ABNORMAL HIGH (ref 70–99)
Glucose-Capillary: 148 mg/dL — ABNORMAL HIGH (ref 70–99)

## 2019-11-02 LAB — BASIC METABOLIC PANEL
Anion gap: 12 (ref 5–15)
BUN: <5 mg/dL — ABNORMAL LOW (ref 6–20)
CO2: 21 mmol/L — ABNORMAL LOW (ref 22–32)
Calcium: 7.8 mg/dL — ABNORMAL LOW (ref 8.9–10.3)
Chloride: 101 mmol/L (ref 98–111)
Creatinine, Ser: 0.55 mg/dL (ref 0.44–1.00)
GFR calc Af Amer: >60 mL/min (ref >60)
GFR calc non Af Amer: >60 mL/min (ref >60)
Glucose, Bld: 205 mg/dL — ABNORMAL HIGH (ref 70–99)
Potassium: 3.1 mmol/L — ABNORMAL LOW (ref 3.5–5.1)
Sodium: 134 mmol/L — ABNORMAL LOW (ref 135–145)

## 2019-11-02 LAB — CBC
HCT: 30.7 % — ABNORMAL LOW (ref 36.0–46.0)
Hemoglobin: 10.9 g/dL — ABNORMAL LOW (ref 12.0–15.0)
MCH: 32.1 pg (ref 26.0–34.0)
MCHC: 35.5 g/dL (ref 30.0–36.0)
MCV: 90.3 fL (ref 80.0–100.0)
Platelets: 200 10*3/uL (ref 150–400)
RBC: 3.4 MIL/uL — ABNORMAL LOW (ref 3.87–5.11)
RDW: 12.8 % (ref 11.5–15.5)
WBC: 17.4 10*3/uL — ABNORMAL HIGH (ref 4.0–10.5)
nRBC: 0 % (ref 0.0–0.2)

## 2019-11-02 LAB — PROTIME-INR
INR: 1.1 (ref 0.8–1.2)
Prothrombin Time: 14.2 seconds (ref 11.4–15.2)

## 2019-11-02 LAB — CORTISOL-AM, BLOOD: Cortisol - AM: 27.3 ug/dL — ABNORMAL HIGH (ref 6.7–22.6)

## 2019-11-02 LAB — PROCALCITONIN: Procalcitonin: 0.45 ng/mL

## 2019-11-02 MED ORDER — INFLUENZA VAC SPLIT QUAD 0.5 ML IM SUSY
0.5000 mL | PREFILLED_SYRINGE | INTRAMUSCULAR | Status: AC
  Administered 2019-11-03: 0.5 mL via INTRAMUSCULAR
  Filled 2019-11-02: qty 0.5

## 2019-11-02 MED ORDER — POTASSIUM CHLORIDE CRYS ER 20 MEQ PO TBCR: 40.0000 meq | EXTENDED_RELEASE_TABLET | Freq: Once | ORAL | Status: DC

## 2019-11-02 NOTE — ED Notes (Signed)
Provider Fran Lowes T to bedside.

## 2019-11-02 NOTE — Progress Notes (Signed)
   11/02/19 1805  Assess: MEWS Score  Temp 99.8 F (37.7 C)  BP 138/80  Pulse Rate (!) 123  Resp 16  SpO2 100 %  O2 Device Room Air  Assess: MEWS Score  MEWS Temp 0  MEWS Systolic 0  MEWS Pulse 2  MEWS RR 0  MEWS LOC 0  MEWS Score 2  MEWS Score Color Yellow  Assess: if the MEWS score is Yellow or Red  Were vital signs taken at a resting state? Yes  Focused Assessment No change from prior assessment  Early Detection of Sepsis Score *See Row Information* Low  MEWS guidelines implemented *See Row Information* No, previously yellow, continue vital signs every 4 hours

## 2019-11-02 NOTE — ED Notes (Signed)
Pt transported to room 35 via stretcher. Pt connected to monitor. Denies further needs at this time. Lights dimmed for rest.

## 2019-11-02 NOTE — ED Notes (Signed)
Pt now stating she would like to leave AMA d/t waiting on a bed for over 24 hours. Educated about the importance of staying for continued treatment and risks if she chooses to leave. Provider Fran Lowes paged/notified. Provider Fran Lowes states she will be to bedside in the next 5 mins. Pt agrees to wait for provider to stop by.

## 2019-11-02 NOTE — ED Notes (Signed)
This RN to bedside as pt requesting brief update on plan. Updated. Visitor remains at bedside.

## 2019-11-03 LAB — GLUCOSE, CAPILLARY
Glucose-Capillary: 124 mg/dL — ABNORMAL HIGH (ref 70–99)
Glucose-Capillary: 171 mg/dL — ABNORMAL HIGH (ref 70–99)
Glucose-Capillary: 134 mg/dL — ABNORMAL HIGH (ref 70–99)
Glucose-Capillary: 246 mg/dL — ABNORMAL HIGH (ref 70–99)

## 2019-11-03 MED ORDER — INSULIN ASPART 100 UNIT/ML ~~LOC~~ SOLN
3.0000 [IU] | Freq: Three times a day (TID) | SUBCUTANEOUS | Status: DC
  Administered 2019-11-04: 3 [IU] via SUBCUTANEOUS
  Filled 2019-11-03: qty 1

## 2019-11-03 MED ORDER — POTASSIUM CHLORIDE CRYS ER 20 MEQ PO TBCR
40.0000 meq | EXTENDED_RELEASE_TABLET | Freq: Once | ORAL | Status: AC
  Administered 2019-11-03: 40 meq via ORAL
  Filled 2019-11-03: qty 2

## 2019-11-03 NOTE — Progress Notes (Signed)
PROGRESS NOTE    Mandy Patel  UYQ:[id number] DOB: 1995-07-06 DOA: 11/01/2019 PCP: Patient, No Pcp Per    Assessment & Plan:   Principal Problem:   Sepsis (HCC) Active Problems:   Acute pyelonephritis   Type 1 diabetes mellitus without complication (HCC)    Mandy Patel is a 24 y.o. female with medical history significant for insulin-dependent diabetes mellitus who presents to the ER for evaluation of abdominal pain which she describes as mostly periumbilical pain.  She rates her pain a 6 x 10 in intensity at its worst and states that there is occasional radiation to her sides.  Pain is associated with nausea and vomiting.  She denies having any fever chills.  She has frequency of urination but denies having any dysuria or nocturia.   Sepsis 2/2 pyelonephritis As evidenced by fever with a T-max of 100.78F, tachycardia, marked leukocytosis with a left shift and pyuria.  Source of sepsis appears to be pyelonephritis.  Patient has a history of E. coli bacteremia. --ceftriaxone started in the ED.  UA was not obtained on admission but added on to original sample later on. PLAN: --continue ceftriaxone pending urine cx results --d/c MIVF since pt is having good oral intake  Diabetes mellitus, Insulin-dependent, uncontrolled Insulin noncompliance --A1c has been 12-13.  Pt admitted to not taking her home insulin as prescribed, and dietary indulgence.   PLAN: --cont Levemir at reduced 10u nightly --SSI TID --start meal-time 3u TID to simplify home insulin regimen --discussed with pt the importance of controlling her BG  N/V, resolved --2/2 pyelonephritis --d/c MIVF  Leukocytosis, improved --WBC trending down with abx tx --continue ceftriaxone   DVT prophylaxis: Lovenox SQ Code Status: Full code  Family Communication:  Status is: inpatient Dispo:   The patient is from: home Anticipated d/c is to: home Anticipated d/c date is: 1-2 days Patient currently is not medically  stable to d/c due to: sepsis with pyelonephritis, currently on IV abx, need urine cx result to de-escalate abx.   Subjective and Interval History:  Pt again mentioned wanting to leave, because she felt well.  Explained again to pt that we need to wait for urine cx results for me to prescribe proper oral abx.  No complaints today.  Eating well.   Objective: Vitals:   11/03/19 0526 11/03/19 0833 11/03/19 1153 11/03/19 1513  BP: 128/84 123/83 (!) 144/92 129/80  Pulse: (!) 110 (!) 109 (!) 110 (!) 105  Resp: 18 18 17 16   Temp: 98.5 F (36.9 C) 98.2 F (36.8 C) 98.1 F (36.7 C) 98.8 F (37.1 C)  TempSrc: Oral Oral Oral Oral  SpO2: 97% 100% 100% 100%  Weight:      Height:        Intake/Output Summary (Last 24 hours) at 11/03/2019 1748 Last data filed at 11/03/2019 1423 Gross per 24 hour  Intake 1440 ml  Output --  Net 1440 ml   Filed Weights   11/01/19 0714  Weight: 74.8 kg    Examination:   Constitutional: NAD, AAOx3 HEENT: conjunctivae and lids normal, EOMI CV: No cyanosis.   RESP: normal respiratory effort, on RA Extremities: No effusions, edema in BLE SKIN: warm, dry and intact Neuro: II - XII grossly intact.   Psych: Normal mood and affect.     Data Reviewed: I have personally reviewed following labs and imaging studies  CBC: Recent Labs  Lab 11/01/19 0726 11/02/19 0044  WBC 23.5* 17.4*  HGB 12.4 10.9*  HCT 36.6 30.7*  MCV 92.0 90.3  PLT 266 200   Basic Metabolic Panel: Recent Labs  Lab 11/01/19 0726 11/02/19 0044  NA 136 134*  K 3.5 3.1*  CL 103 101  CO2 22 21*  GLUCOSE 279* 205*  BUN 7 <5*  CREATININE 0.51 0.55  CALCIUM 9.0 7.8*   GFR: Estimated Creatinine Clearance: 110.3 mL/min (by C-G formula based on SCr of 0.55 mg/dL). Liver Function Tests: Recent Labs  Lab 11/01/19 0726  AST 12*  ALT 13  ALKPHOS 82  BILITOT 0.7  PROT 8.2*  ALBUMIN 4.1   Recent Labs  Lab 11/01/19 0726  LIPASE 29   No results for input(s): AMMONIA in the  last 168 hours. Coagulation Profile: Recent Labs  Lab 11/02/19 0044  INR 1.1   Cardiac Enzymes: No results for input(s): CKTOTAL, CKMB, CKMBINDEX, TROPONINI in the last 168 hours. BNP (last 3 results) No results for input(s): PROBNP in the last 8760 hours. HbA1C: Recent Labs    11/01/19 1543  HGBA1C 12.7*   CBG: Recent Labs  Lab 11/02/19 1823 11/02/19 2124 11/03/19 0835 11/03/19 1153 11/03/19 1636  GLUCAP 153* 148* 124* 171* 134*   Lipid Profile: No results for input(s): CHOL, HDL, LDLCALC, TRIG, CHOLHDL, LDLDIRECT in the last 72 hours. Thyroid Function Tests: No results for input(s): TSH, T4TOTAL, FREET4, T3FREE, THYROIDAB in the last 72 hours. Anemia Panel: No results for input(s): VITAMINB12, FOLATE, FERRITIN, TIBC, IRON, RETICCTPCT in the last 72 hours. Sepsis Labs: Recent Labs  Lab 11/01/19 1229 11/02/19 0044  PROCALCITON  --  0.45  LATICACIDVEN 1.8  --     Recent Results (from the past 240 hour(s))  Blood culture (routine x 2)     Status: None (Preliminary result)   Collection Time: 11/01/19 12:24 PM   Specimen: BLOOD  Result Value Ref Range Status   Specimen Description BLOOD BLOOD LEFT FOREARM  Final   Special Requests   Final    BOTTLES DRAWN AEROBIC AND ANAEROBIC Blood Culture results may not be optimal due to an inadequate volume of blood received in culture bottles   Culture   Final    NO GROWTH 2 DAYS Performed at [facility], [address], [place], [place] [address]    Report Status PENDING  Incomplete  Respiratory Panel by RT PCR (Flu A&B, Covid) - Nasopharyngeal Swab     Status: None   Collection Time: 11/01/19 12:29 PM   Specimen: Nasopharyngeal Swab  Result Value Ref Range Status   SARS Coronavirus 2 by RT PCR NEGATIVE NEGATIVE Final    Comment: (NOTE) SARS-CoV-2 target nucleic acids are NOT DETECTED.  The SARS-CoV-2 RNA is generally detectable in upper respiratoy specimens during the acute phase of infection. The  lowest concentration of SARS-CoV-2 viral copies this assay can detect is 131 copies/mL. A negative result does not preclude SARS-Cov-2 infection and should not be used as the sole basis for treatment or other patient management decisions. A negative result may occur with  improper specimen collection/handling, submission of specimen other than nasopharyngeal swab, presence of viral mutation(s) within the areas targeted by this assay, and inadequate number of viral copies (<131 copies/mL). A negative result must be combined with clinical observations, patient history, and epidemiological information. The expected result is Negative.  Fact Sheet for Patients:  https://www.moore.com/  Fact Sheet for Healthcare Providers:  https://www.young.biz/  This test is no t yet approved or cleared by the [place] FDA and  has been authorized for detection and/or diagnosis of SARS-CoV-2  by FDA under an Emergency Use Authorization (EUA). This EUA will remain  in effect (meaning this test can be used) for the duration of the COVID-19 declaration under Section 564(b)(1) of the Act, 21 U.S.C. section 360bbb-3(b)(1), unless the authorization is terminated or revoked sooner.     Influenza A by PCR NEGATIVE NEGATIVE Final   Influenza B by PCR NEGATIVE NEGATIVE Final    Comment: (NOTE) The Xpert Xpress SARS-CoV-2/FLU/RSV assay is intended as an aid in  the diagnosis of influenza from Nasopharyngeal swab specimens and  should not be used as a sole basis for treatment. Nasal washings and  aspirates are unacceptable for Xpert Xpress SARS-CoV-2/FLU/RSV  testing.  Fact Sheet for Patients: https://www.moore.com/  Fact Sheet for Healthcare Providers: https://www.young.biz/  This test is not yet approved or cleared by the [place] FDA and  has been authorized for detection and/or diagnosis of SARS-CoV-2 by  FDA under  an Emergency Use Authorization (EUA). This EUA will remain  in effect (meaning this test can be used) for the duration of the  Covid-19 declaration under Section 564(b)(1) of the Act, 21  U.S.C. section 360bbb-3(b)(1), unless the authorization is  terminated or revoked. Performed at [facility], [address] [place], [place] [address]   Blood culture (routine x 2)     Status: None (Preliminary result)   Collection Time: 11/01/19  3:43 PM   Specimen: BLOOD  Result Value Ref Range Status   Specimen Description BLOOD BLOOD LEFT HAND  Final   Special Requests   Final    BOTTLES DRAWN AEROBIC AND ANAEROBIC Blood Culture results may not be optimal due to an inadequate volume of blood received in culture bottles   Culture   Final    NO GROWTH 2 DAYS Performed at [facility], [address] [place], [place] [address]    Report Status PENDING  Incomplete      Radiology Studies: No results found.   Scheduled Meds: . enoxaparin (LOVENOX) injection  40 mg Subcutaneous Q24H  . insulin aspart  0-15 Units Subcutaneous TID WC  . insulin detemir  10 Units Subcutaneous QHS   Continuous Infusions: . cefTRIAXone (ROCEPHIN)  IV 1 g (11/02/19 2139)     LOS: 2 days     Darlin Priestly, MD [facility] If 7PM-7AM, please contact night-coverage 11/03/2019, 5:48 PM

## 2019-11-03 NOTE — Progress Notes (Signed)
PROGRESS NOTE    Mandy Patel  BUL:[id number] DOB: 17-Dec-1995 DOA: 11/01/2019 PCP: Patient, No Pcp Per    Assessment & Plan:   Principal Problem:   Sepsis (HCC) Active Problems:   Acute pyelonephritis   Type 1 diabetes mellitus without complication (HCC)    Mandy Patel is a 24 y.o. female with medical history significant for insulin-dependent diabetes mellitus who presents to the ER for evaluation of abdominal pain which she describes as mostly periumbilical pain.  She rates her pain a 6 x 10 in intensity at its worst and states that there is occasional radiation to her sides.  Pain is associated with nausea and vomiting.  She denies having any fever chills.  She has frequency of urination but denies having any dysuria or nocturia.   Sepsis 2/2 pyelonephritis As evidenced by fever with a T-max of 100.70F, tachycardia, marked leukocytosis with a left shift and pyuria.  Source of sepsis appears to be pyelonephritis.  Patient has a history of E. coli bacteremia. --ceftriaxone started in the ED.  UA was not obtained on admission. PLAN: --continue ceftriaxone --add on urine cx to original urine sample send from the ED --cont MIVF  Diabetes mellitus, Insulin-dependent, uncontrolled --A1c has been 12-13.  PLAN: --cont Levemir at reduced 10u nightly --SSI TID  N/V, resolved --2/2 pyelonephritis --cont MIVF for now   DVT prophylaxis: Lovenox SQ Code Status: Full code  Family Communication: boyfriend updated at the bedside Status is: inpatient Dispo:   The patient is from: home Anticipated d/c is to: home Anticipated d/c date is: 2-3 days Patient currently is not medically stable to d/c due to: sepsis with pyelonephritis, currently on IV abx, need urine cx result to de-escalate abx.   Subjective and Interval History:  Pt wanted to leave AMA from the ED today because she was upset that she hadn't gotten a room yet.  Mild right flank pain.  No more  N/V.   Objective: Vitals:   11/03/19 0101 11/03/19 0526 11/03/19 0833 11/03/19 1153  BP: 110/61 128/84 123/83 (!) 144/92  Pulse: (!) 108 (!) 110 (!) 109 (!) 110  Resp: 18 18 18 17   Temp: 98.6 F (37 C) 98.5 F (36.9 C) 98.2 F (36.8 C) 98.1 F (36.7 C)  TempSrc: Oral Oral Oral Oral  SpO2: 99% 97% 100% 100%  Weight:      Height:        Intake/Output Summary (Last 24 hours) at 11/03/2019 1501 Last data filed at 11/03/2019 1423 Gross per 24 hour  Intake 1440 ml  Output --  Net 1440 ml   Filed Weights   11/01/19 0714  Weight: 74.8 kg    Examination:   Constitutional: NAD, AAOx3 HEENT: conjunctivae and lids normal, EOMI CV: RRR no M,R,G. Distal pulses +2.  No cyanosis.   RESP: CTA B/L, normal respiratory effort  GI: +BS, NTND Extremities: No effusions, edema, or tenderness in BLE SKIN: warm, dry and intact Neuro: II - XII grossly intact.  Sensation intact Psych: Normal mood and affect.     Data Reviewed: I have personally reviewed following labs and imaging studies  CBC: Recent Labs  Lab 11/01/19 0726 11/02/19 0044  WBC 23.5* 17.4*  HGB 12.4 10.9*  HCT 36.6 30.7*  MCV 92.0 90.3  PLT 266 200   Basic Metabolic Panel: Recent Labs  Lab 11/01/19 0726 11/02/19 0044  NA 136 134*  K 3.5 3.1*  CL 103 101  CO2 22 21*  GLUCOSE 279* 205*  BUN 7 <5*  CREATININE 0.51 0.55  CALCIUM 9.0 7.8*   GFR: Estimated Creatinine Clearance: 110.3 mL/min (by C-G formula based on SCr of 0.55 mg/dL). Liver Function Tests: Recent Labs  Lab 11/01/19 0726  AST 12*  ALT 13  ALKPHOS 82  BILITOT 0.7  PROT 8.2*  ALBUMIN 4.1   Recent Labs  Lab 11/01/19 0726  LIPASE 29   No results for input(s): AMMONIA in the last 168 hours. Coagulation Profile: Recent Labs  Lab 11/02/19 0044  INR 1.1   Cardiac Enzymes: No results for input(s): CKTOTAL, CKMB, CKMBINDEX, TROPONINI in the last 168 hours. BNP (last 3 results) No results for input(s): PROBNP in the last 8760  hours. HbA1C: Recent Labs    11/01/19 1543  HGBA1C 12.7*   CBG: Recent Labs  Lab 11/02/19 1647 11/02/19 1823 11/02/19 2124 11/03/19 0835 11/03/19 1153  GLUCAP 133* 153* 148* 124* 171*   Lipid Profile: No results for input(s): CHOL, HDL, LDLCALC, TRIG, CHOLHDL, LDLDIRECT in the last 72 hours. Thyroid Function Tests: No results for input(s): TSH, T4TOTAL, FREET4, T3FREE, THYROIDAB in the last 72 hours. Anemia Panel: No results for input(s): VITAMINB12, FOLATE, FERRITIN, TIBC, IRON, RETICCTPCT in the last 72 hours. Sepsis Labs: Recent Labs  Lab 11/01/19 1229 11/02/19 0044  PROCALCITON  --  0.45  LATICACIDVEN 1.8  --     Recent Results (from the past 240 hour(s))  Blood culture (routine x 2)     Status: None (Preliminary result)   Collection Time: 11/01/19 12:24 PM   Specimen: BLOOD  Result Value Ref Range Status   Specimen Description BLOOD BLOOD LEFT FOREARM  Final   Special Requests   Final    BOTTLES DRAWN AEROBIC AND ANAEROBIC Blood Culture results may not be optimal due to an inadequate volume of blood received in culture bottles   Culture   Final    NO GROWTH 2 DAYS Performed at [facility], [address] [place], [place] [address]    Report Status PENDING  Incomplete  Respiratory Panel by RT PCR (Flu A&B, Covid) - Nasopharyngeal Swab     Status: None   Collection Time: 11/01/19 12:29 PM   Specimen: Nasopharyngeal Swab  Result Value Ref Range Status   SARS Coronavirus 2 by RT PCR NEGATIVE NEGATIVE Final    Comment: (NOTE) SARS-CoV-2 target nucleic acids are NOT DETECTED.  The SARS-CoV-2 RNA is generally detectable in upper respiratoy specimens during the acute phase of infection. The lowest concentration of SARS-CoV-2 viral copies this assay can detect is 131 copies/mL. A negative result does not preclude SARS-Cov-2 infection and should not be used as the sole basis for treatment or other patient management decisions. A negative result may  occur with  improper specimen collection/handling, submission of specimen other than nasopharyngeal swab, presence of viral mutation(s) within the areas targeted by this assay, and inadequate number of viral copies (<131 copies/mL). A negative result must be combined with clinical observations, patient history, and epidemiological information. The expected result is Negative.  Fact Sheet for Patients:  https://www.moore.com/  Fact Sheet for Healthcare Providers:  https://www.young.biz/  This test is no t yet approved or cleared by the [place] FDA and  has been authorized for detection and/or diagnosis of SARS-CoV-2 by FDA under an Emergency Use Authorization (EUA). This EUA will remain  in effect (meaning this test can be used) for the duration of the COVID-19 declaration under Section 564(b)(1) of the Act, 21 U.S.C. section 360bbb-3(b)(1), unless the authorization  is terminated or revoked sooner.     Influenza A by PCR NEGATIVE NEGATIVE Final   Influenza B by PCR NEGATIVE NEGATIVE Final    Comment: (NOTE) The Xpert Xpress SARS-CoV-2/FLU/RSV assay is intended as an aid in  the diagnosis of influenza from Nasopharyngeal swab specimens and  should not be used as a sole basis for treatment. Nasal washings and  aspirates are unacceptable for Xpert Xpress SARS-CoV-2/FLU/RSV  testing.  Fact Sheet for Patients: https://www.moore.com/  Fact Sheet for Healthcare Providers: https://www.young.biz/  This test is not yet approved or cleared by the [place] FDA and  has been authorized for detection and/or diagnosis of SARS-CoV-2 by  FDA under an Emergency Use Authorization (EUA). This EUA will remain  in effect (meaning this test can be used) for the duration of the  Covid-19 declaration under Section 564(b)(1) of the Act, 21  U.S.C. section 360bbb-3(b)(1), unless the authorization is  terminated or  revoked. Performed at [facility], [address] [place], [place] [address]   Blood culture (routine x 2)     Status: None (Preliminary result)   Collection Time: 11/01/19  3:43 PM   Specimen: BLOOD  Result Value Ref Range Status   Specimen Description BLOOD BLOOD LEFT HAND  Final   Special Requests   Final    BOTTLES DRAWN AEROBIC AND ANAEROBIC Blood Culture results may not be optimal due to an inadequate volume of blood received in culture bottles   Culture   Final    NO GROWTH 2 DAYS Performed at [facility], [address], [place], [place] [address]    Report Status PENDING  Incomplete      Radiology Studies: No results found.   Scheduled Meds:  enoxaparin (LOVENOX) injection  40 mg Subcutaneous Q24H   insulin aspart  0-15 Units Subcutaneous TID WC   insulin detemir  10 Units Subcutaneous QHS   Continuous Infusions:  cefTRIAXone (ROCEPHIN)  IV 1 g (11/02/19 2139)     LOS: 2 days     Darlin Priestly, MD [facility] If 7PM-7AM, please contact night-coverage 11/03/2019, 3:01 PM

## 2019-11-03 NOTE — Progress Notes (Signed)
Nurse tech asked me to go in room as she was concerned pt and her boyfriend were in the shower "talking fully clothed."  This nurse entered the room with nurse supervisor and pt and boyfriend standing in hallway of bathroom.  Pt's eyes were glossy in appearance and looking much different then my previous rounding on the patient within the past hour.  Pt was explained that the doctor is waiting on culture results to ensure she is on the best antibiotics to fight her infection.

## 2019-11-04 LAB — BASIC METABOLIC PANEL
Anion gap: 9 (ref 5–15)
BUN: <5 mg/dL — ABNORMAL LOW (ref 6–20)
CO2: 22 mmol/L (ref 22–32)
Calcium: 8.2 mg/dL — ABNORMAL LOW (ref 8.9–10.3)
Chloride: 105 mmol/L (ref 98–111)
Creatinine, Ser: 0.32 mg/dL — ABNORMAL LOW (ref 0.44–1.00)
GFR calc Af Amer: >60 mL/min (ref >60)
GFR calc non Af Amer: >60 mL/min (ref >60)
Glucose, Bld: 139 mg/dL — ABNORMAL HIGH (ref 70–99)
Potassium: 3.2 mmol/L — ABNORMAL LOW (ref 3.5–5.1)
Sodium: 136 mmol/L (ref 135–145)

## 2019-11-04 LAB — CBC
HCT: 27.6 % — ABNORMAL LOW (ref 36.0–46.0)
Hemoglobin: 9.2 g/dL — ABNORMAL LOW (ref 12.0–15.0)
MCH: 30.9 pg (ref 26.0–34.0)
MCHC: 33.3 g/dL (ref 30.0–36.0)
MCV: 92.6 fL (ref 80.0–100.0)
Platelets: 167 10*3/uL (ref 150–400)
RBC: 2.98 MIL/uL — ABNORMAL LOW (ref 3.87–5.11)
RDW: 13.1 % (ref 11.5–15.5)
WBC: 9.3 10*3/uL (ref 4.0–10.5)
nRBC: 0 % (ref 0.0–0.2)

## 2019-11-04 LAB — GLUCOSE, CAPILLARY
Glucose-Capillary: 121 mg/dL — ABNORMAL HIGH (ref 70–99)
Glucose-Capillary: 105 mg/dL — ABNORMAL HIGH (ref 70–99)

## 2019-11-04 LAB — MAGNESIUM: Magnesium: 2.1 mg/dL (ref 1.7–2.4)

## 2019-11-04 MED ORDER — INSULIN LISPRO 100 UNIT/ML ~~LOC~~ SOLN: 4.0000 [IU] | Freq: Three times a day (TID) | SUBCUTANEOUS | 2 refills | Status: DC

## 2019-11-04 MED ORDER — POTASSIUM CHLORIDE CRYS ER 20 MEQ PO TBCR
40.0000 meq | EXTENDED_RELEASE_TABLET | Freq: Once | ORAL | Status: AC
  Administered 2019-11-04: 40 meq via ORAL
  Filled 2019-11-04: qty 2

## 2019-11-04 MED ORDER — CIPROFLOXACIN HCL 500 MG PO TABS: 500.0000 mg | ORAL_TABLET | Freq: Two times a day (BID) | ORAL | 0 refills | Status: AC

## 2019-11-04 MED ORDER — INSULIN DETEMIR 100 UNIT/ML ~~LOC~~ SOLN: 10.0000 [IU] | Freq: Every day | SUBCUTANEOUS | 2 refills | Status: DC

## 2019-11-04 NOTE — Plan of Care (Signed)
  Problem: Education: Goal: Knowledge of General Education information will improve Description: Including pain rating scale, medication(s)/side effects and non-pharmacologic comfort measures Outcome: Adequate for Discharge   

## 2019-11-04 NOTE — Discharge Summary (Signed)
Physician Discharge Summary   Mandy Patel  female DOB: 04-Oct-1995  MLJ:[id number]  PCP: Patient, No Pcp Per  Admit date: 11/01/2019 Discharge date: 11/04/2019  Admitted From: home Disposition:  home CODE STATUS: Full code  Discharge Instructions    Discharge instructions   Complete by: As directed    You have kidney infection, and have received 5 days of IV antibiotics.  Please finish 5 more days of oral Cipro at home as directed.  Urine culture grew E coli, but sensitivity results are not out yet.  Because you want to leave today, I am picking Cipro which normally works for E. Coli infection.  I will give you a call tomorrow if urine culture sensitivity results show you need a different antibiotic.    I have prescribed long-acting insulin Levemir that you will take 10 units nightly, and short-acting insulin Humalog 4 units with each meal.  Please follow up with your outpatient doctor to better manage your diabetes.   Dr. Enzo Bi [facility] Course:  For full details, please see H&P, progress notes, consult notes and ancillary notes.  Briefly,  Mandy Patel a 24 y.o.femalewith medical history significant forinsulin-dependent diabetes mellitus who presented to the ER for evaluation of abdominal pain which she described as mostly periumbilical pain. Pain was associated with nausea and vomiting. She denied having any fever chills. She had frequency of urination but denied having any dysuria or nocturia.   Sepsis 2/2 pyelonephritis As evidenced by fever with a T-max of100.18F,tachycardia, marked leukocytosis with a left shift and pyuria.  Source of sepsis appears to be pyelonephritis.  Patient has a history of E. coli bacteremia. Ceftriaxone started in the ED.  UA was not obtained on admission but later added on to original collected urine.  Pt's symptom and leukocytosis improved with IV ceftriaxone.  Pt received 5 days of IV ceftriaxone and requested to go  home due to not want to miss work for a new job.  Urine cx hadn't resulted prior to discharge, so Cipro was empirically chose at discharge for pt to continue for 5 more days at home.    Diabetes mellitus, Insulin-dependent, uncontrolled Insulin noncompliance  A1c has been 12-13.  Pt admitted to not taking insulin.  Based on the usage in the hospital, pt was discharged on Levemir 10u nightly and meal-time 4u TID, in the hope that with this simplified regimen, pt will be more compliant.  Discussed with pt the importance of controlling her diabetes to prevent complications.  N/V, resolved 2/2 pyelonephritis.  Resolved with treatment of pyelonephritis.   Discharge Diagnoses:  Principal Problem:   Sepsis ([name]) Active Problems:   Acute pyelonephritis   Type 1 diabetes mellitus without complication [facility])    Discharge Instructions:  Allergies as of 11/04/2019      Reactions   Peanut Oil Other (See Comments)   Reaction to peanuts - gums feel numb and tingling   Other Swelling   Pecans caused throat swelling and weakness      Medication List    TAKE these medications   blood glucose meter kit and supplies Kit Dispense based on patient and insurance preference. Use up to four times daily as directed. (FOR ICD-9 250.00, 250.01).   ciprofloxacin 500 MG tablet Commonly known as: Cipro Take 1 tablet (500 mg total) by mouth 2 (two) times daily for 5 days.   etonogestrel 68 MG Impl implant Commonly known as: NEXPLANON 1 each  by Subdermal route once. **Implanted Sept 2016**   insulin detemir 100 UNIT/ML injection Commonly known as: LEVEMIR Inject 0.1 mLs (10 Units total) into the skin at bedtime. What changed: how much to take   insulin lispro 100 UNIT/ML injection Commonly known as: HumaLOG Inject 0.04 mLs (4 Units total) into the skin 3 (three) times daily with meals.         Allergies  Allergen Reactions  . Peanut Oil Other (See Comments)    Reaction to peanuts - gums  feel numb and tingling  . Other Swelling    Pecans caused throat swelling and weakness     The results of significant diagnostics from this hospitalization (including imaging, microbiology, ancillary and laboratory) are listed below for reference.   Consultations:   Procedures/Studies: CT ABDOMEN PELVIS W CONTRAST  Result Date: 11/01/2019 CLINICAL DATA:  Acute nonlocalized abdominal pain.  Type 1 diabetes EXAM: CT ABDOMEN AND PELVIS WITH CONTRAST TECHNIQUE: Multidetector CT imaging of the abdomen and pelvis was performed using the standard protocol following bolus administration of intravenous contrast. CONTRAST:  174m OMNIPAQUE IOHEXOL 300 MG/ML  SOLN COMPARISON:  05/31/2018 noncontrast CT FINDINGS: Lower chest:  No contributory findings. Hepatobiliary: No focal liver abnormality.No evidence of biliary obstruction or stone. Pancreas: Unremarkable. Spleen: Unremarkable. Adrenals/Urinary Tract: Negative adrenals. Striated nephrogram at the upper right and likely upper left renal poles. Prominent calices on both sides without ballooning, question prior renal papillary necrosis. Partial duplication of the right ureters. Prominent bladder wall thickness without visible debris or over distention. Stomach/Bowel:  No obstruction. No appendicitis. Vascular/Lymphatic: No acute vascular abnormality. No mass or adenopathy. Reproductive:No pathologic findings. Other: No ascites or pneumoperitoneum. Musculoskeletal: No acute abnormalities. IMPRESSION: Pyelonephritis findings affecting the right more than left kidney. No abscess or hydronephrosis. Electronically Signed   By: JMonte FantasiaM.D.   On: 11/01/2019 10:10      Labs: BNP (last 3 results) No results for input(s): BNP in the last 8760 hours. Basic Metabolic Panel: Recent Labs  Lab 11/01/19 0726 11/02/19 0044 11/04/19 0641  NA 136 134* 136  K 3.5 3.1* 3.2*  CL 103 101 105  CO2 22 21* 22  GLUCOSE 279* 205* 139*  BUN 7 <5* <5*  CREATININE  0.51 0.55 0.32*  CALCIUM 9.0 7.8* 8.2*  MG  --   --  2.1   Liver Function Tests: Recent Labs  Lab 11/01/19 0726  AST 12*  ALT 13  ALKPHOS 82  BILITOT 0.7  PROT 8.2*  ALBUMIN 4.1   Recent Labs  Lab 11/01/19 0726  LIPASE 29   No results for input(s): AMMONIA in the last 168 hours. CBC: Recent Labs  Lab 11/01/19 0726 11/02/19 0044 11/04/19 0641  WBC 23.5* 17.4* 9.3  HGB 12.4 10.9* 9.2*  HCT 36.6 30.7* 27.6*  MCV 92.0 90.3 92.6  PLT 266 200 167   Cardiac Enzymes: No results for input(s): CKTOTAL, CKMB, CKMBINDEX, TROPONINI in the last 168 hours. BNP: Invalid input(s): POCBNP CBG: Recent Labs  Lab 11/03/19 1153 11/03/19 1636 11/03/19 2121 11/04/19 0755 11/04/19 1207  GLUCAP 171* 134* 246* 121* 105*   D-Dimer No results for input(s): DDIMER in the last 72 hours. Hgb A1c Recent Labs    11/01/19 1543  HGBA1C 12.7*   Lipid Profile No results for input(s): CHOL, HDL, LDLCALC, TRIG, CHOLHDL, LDLDIRECT in the last 72 hours. Thyroid function studies No results for input(s): TSH, T4TOTAL, T3FREE, THYROIDAB in the last 72 hours.  Invalid input(s): FREET3 Anemia work up No  results for input(s): VITAMINB12, FOLATE, FERRITIN, TIBC, IRON, RETICCTPCT in the last 72 hours. Urinalysis    Component Value Date/Time   COLORURINE YELLOW (A) 11/01/2019 0726   APPEARANCEUR CLOUDY (A) 11/01/2019 0726   LABSPEC 1.027 11/01/2019 0726   PHURINE 5.0 11/01/2019 0726   GLUCOSEU >=500 (A) 11/01/2019 0726   GLUCOSEU >=1000 12/01/2010 1051   HGBUR MODERATE (A) 11/01/2019 0726   BILIRUBINUR NEGATIVE 11/01/2019 0726   KETONESUR 20 (A) 11/01/2019 0726   PROTEINUR 30 (A) 11/01/2019 0726   UROBILINOGEN 0.2 12/01/2010 1051   NITRITE POSITIVE (A) 11/01/2019 0726   LEUKOCYTESUR SMALL (A) 11/01/2019 0726   Sepsis Labs Invalid input(s): PROCALCITONIN,  WBC,  LACTICIDVEN Microbiology Recent Results (from the past 240 hour(s))  Urine Culture     Status: Abnormal (Preliminary result)    Collection Time: 11/01/19  7:26 AM   Specimen: Urine, Random  Result Value Ref Range Status   Specimen Description   Final    URINE, RANDOM Performed at [facility], [address], [place], [place] [address]    Special Requests   Final    NONE Performed at [facility], [address] [place], [place] [address]    Culture (A)  Final    >=100,000 COLONIES/mL ESCHERICHIA COLI SUSCEPTIBILITIES TO FOLLOW Performed at [facility], [place] [address] [place], [place] [address]    Report Status PENDING  Incomplete  Blood culture (routine x 2)     Status: None (Preliminary result)   Collection Time: 11/01/19 12:24 PM   Specimen: BLOOD  Result Value Ref Range Status   Specimen Description BLOOD BLOOD LEFT FOREARM  Final   Special Requests   Final    BOTTLES DRAWN AEROBIC AND ANAEROBIC Blood Culture results may not be optimal due to an inadequate volume of blood received in culture bottles   Culture   Final    NO GROWTH 3 DAYS Performed at [facility], [address], [place], [name] [address]    Report Status PENDING  Incomplete  Respiratory Panel by RT PCR (Flu A&B, Covid) - Nasopharyngeal Swab     Status: None   Collection Time: 11/01/19 12:29 PM   Specimen: Nasopharyngeal Swab  Result Value Ref Range Status   SARS Coronavirus 2 by RT PCR NEGATIVE NEGATIVE Final    Comment: (NOTE) SARS-CoV-2 target nucleic acids are NOT DETECTED.  The SARS-CoV-2 RNA is generally detectable in upper respiratoy specimens during the acute phase of infection. The lowest concentration of SARS-CoV-2 viral copies this assay can detect is 131 copies/mL. A negative result does not preclude SARS-Cov-2 infection and should not be used as the sole basis for treatment or other patient management decisions. A negative result may occur with  improper specimen collection/handling, submission of specimen other than nasopharyngeal swab, presence of viral mutation(s) within  the areas targeted by this assay, and inadequate number of viral copies (<131 copies/mL). A negative result must be combined with clinical observations, patient history, and epidemiological information. The expected result is Negative.  Fact Sheet for Patients:  PinkCheek.be  Fact Sheet for Healthcare Providers:  GravelBags.it  This test is no t yet approved or cleared by the [place] FDA and  has been authorized for detection and/or diagnosis of SARS-CoV-2 by FDA under an Emergency Use Authorization (EUA). This EUA will remain  in effect (meaning this test can be used) for the duration of the COVID-19 declaration under Section 564(b)(1) of the Act, 21 U.S.C. section 360bbb-3(b)(1), unless  the authorization is terminated or revoked sooner.     Influenza A by PCR NEGATIVE NEGATIVE Final   Influenza B by PCR NEGATIVE NEGATIVE Final    Comment: (NOTE) The Xpert Xpress SARS-CoV-2/FLU/RSV assay is intended as an aid in  the diagnosis of influenza from Nasopharyngeal swab specimens and  should not be used as a sole basis for treatment. Nasal washings and  aspirates are unacceptable for Xpert Xpress SARS-CoV-2/FLU/RSV  testing.  Fact Sheet for Patients: PinkCheek.be  Fact Sheet for Healthcare Providers: GravelBags.it  This test is not yet approved or cleared by the [place] FDA and  has been authorized for detection and/or diagnosis of SARS-CoV-2 by  FDA under an Emergency Use Authorization (EUA). This EUA will remain  in effect (meaning this test can be used) for the duration of the  Covid-19 declaration under Section 564(b)(1) of the Act, 21  U.S.C. section 360bbb-3(b)(1), unless the authorization is  terminated or revoked. Performed at [facility], [address] [place], [place] [address]   Blood culture (routine x 2)     Status: None  (Preliminary result)   Collection Time: 11/01/19  3:43 PM   Specimen: BLOOD  Result Value Ref Range Status   Specimen Description BLOOD BLOOD LEFT HAND  Final   Special Requests   Final    BOTTLES DRAWN AEROBIC AND ANAEROBIC Blood Culture results may not be optimal due to an inadequate volume of blood received in culture bottles   Culture   Final    NO GROWTH 3 DAYS Performed at [facility], [address], [place], [place] [address]    Report Status PENDING  Incomplete     Total time spend on discharging this patient, including the last patient exam, discussing the hospital stay, instructions for ongoing care as it relates to all pertinent caregivers, as well as preparing the medical discharge records, prescriptions, and/or referrals as applicable, is 40 minutes.    Enzo Bi, MD  [facility] 11/04/2019, 1:20 PM  If 7PM-7AM, please contact night-coverage

## 2019-11-05 LAB — URINE CULTURE: Culture: >=100000 — AB

## 2019-11-06 LAB — CULTURE, BLOOD (ROUTINE X 2)
Culture: NO GROWTH
Culture: NO GROWTH

## 2020-01-02 ENCOUNTER — Ambulatory Visit (INDEPENDENT_AMBULATORY_CARE_PROVIDER_SITE_OTHER): Payer: No Typology Code available for payment source | Admitting: Obstetrics and Gynecology

## 2020-01-02 ENCOUNTER — Other Ambulatory Visit: Payer: Self-pay

## 2020-01-02 ENCOUNTER — Encounter: Payer: Self-pay | Admitting: Obstetrics and Gynecology

## 2020-01-02 VITALS — BP 120/80 | Ht 68.0 in | Wt 162.0 lb

## 2020-01-02 DIAGNOSIS — Z3046 Encounter for surveillance of implantable subdermal contraceptive: Secondary | ICD-10-CM

## 2020-01-02 NOTE — Patient Instructions (Signed)
I value your feedback and entrusting us with your care. If you get a [facility] patient survey, I would appreciate you taking the time to let us know about your experience today. Thank you!  As of January 11, 2019, your lab results will be released to your MyChart immediately, before I even have a chance to see them. Please give me time to review them and contact you if there are any abnormalities. Thank you for your patience.   Remove the dressing in 24 hours,  keep the incision area dry for 24 hours and remove the Steristrip in 2-3  days.  Notify us if any signs of tenderness, redness, pain, or fevers develop.  

## 2020-01-02 NOTE — Progress Notes (Signed)
Patient, No Pcp Per   Chief Complaint  Patient presents with  . Nexplanon removal    not interested in [facility]    HPI:      [name] is a 24 y.o. G0P0000 whose LMP was No LMP recorded. Patient has had an implant., presents today for NP nexplanon removal. Placed 4 yrs ago; would like to conceive. Menses have been monthly, lasting 5 days, no BTB, no dysmen with nexplanon. Not taking PNVs. She is not current on pap/STD testing. Declines today.  Hx of tpe 1 DM and DKA   Past Medical History:  Diagnosis Date  . DKA (diabetic ketoacidosis) ([place])   . Pyelonephritis 03/2017  . Type 1 diabetes [facility])     diagnosed age 53yo    Past Surgical History:  Procedure Laterality Date  . EYE MUSCLE SURGERY  approx 2010   bilat eye surgury, left exotropia worse;Dr Annamaria Boots, opthomology    Family History  Problem Relation Age of Onset  . Alcohol abuse Other   . Arthritis Other   . Hyperlipidemia Other   . Stroke Other   . Diabetes Other   . Hyperlipidemia Other   . Hypertension Other   . Diabetes Other     Social History   Socioeconomic History  . Marital status: Single    Spouse name: Not on file  . Number of children: Not on file  . Years of education: Not on file  . Highest education level: Not on file  Occupational History  . Occupation: Lexicographer: UNEMPLOYED  Tobacco Use  . Smoking status: Never Smoker  . Smokeless tobacco: Never Used  Vaping Use  . Vaping Use: Never used  Substance and Sexual Activity  . Alcohol use: No  . Drug use: Yes    Types: Marijuana  . Sexual activity: Yes    Birth control/protection: Implant  Other Topics Concern  . Not on file  Social History Narrative  . Not on file   Social Determinants of Health   Financial Resource Strain:   . Difficulty of Paying Living Expenses: Not on file  Food Insecurity:   . Worried About Charity fundraiser in the Last Year: Not on file  . Ran Out of Food in the Last Year: Not on file    Transportation Needs:   . Lack of Transportation (Medical): Not on file  . Lack of Transportation (Non-Medical): Not on file  Physical Activity:   . Days of Exercise per Week: Not on file  . Minutes of Exercise per Session: Not on file  Stress:   . Feeling of Stress : Not on file  Social Connections:   . Frequency of Communication with Friends and Family: Not on file  . Frequency of Social Gatherings with Friends and Family: Not on file  . Attends Religious Services: Not on file  . Active Member of Clubs or Organizations: Not on file  . Attends Archivist Meetings: Not on file  . Marital Status: Not on file  Intimate Partner Violence:   . Fear of Current or Ex-Partner: Not on file  . Emotionally Abused: Not on file  . Physically Abused: Not on file  . Sexually Abused: Not on file    Outpatient Medications Prior to Visit  Medication Sig Dispense Refill  . blood glucose meter kit and supplies KIT Dispense based on patient and insurance preference. Use up to four times daily as directed. (FOR ICD-9 250.00, 250.01).  1 each 0  . etonogestrel (NEXPLANON) 68 MG IMPL implant 1 each by Subdermal route once. **Implanted Sept 2016**    . insulin detemir (LEVEMIR) 100 UNIT/ML injection Inject 0.1 mLs (10 Units total) into the skin at bedtime. 3 mL 2  . insulin lispro (HUMALOG) 100 UNIT/ML injection Inject 0.04 mLs (4 Units total) into the skin 3 (three) times daily with meals. 3.6 mL 2   No facility-administered medications prior to visit.    Nexplanon removal Procedure note - The Nexplanon was noted in the patient's arm and the end was identified. The skin was cleansed with a Betadine solution. A small injection of subcutaneous lidocaine with epinephrine was given over the end of the implant. An incision was made at the end of the implant. The rod was noted in the incision and grasped with a hemostat. It was noted to be intact.  Steri-Strip was placed approximating the incision.  Hemostasis was noted.  Assessment: Nexplanon removal  Cervical cancer screening--declines today. RTO for annual.  Start PNVs.   Plan:   She was told to remove the dressing in 12-24 hours, to keep the incision area dry for 24 hours and to remove the Steristrip in 2-3  days.  Notify us if any signs of tenderness, redness, pain, or fevers develop.  Return in about 4 weeks (around 37/62/8315) for annual.  Emmogene Simson B. Consuelo Thayne, PA-C 01/02/2020 2:35 PM

## 2020-02-13 ENCOUNTER — Ambulatory Visit: Payer: Self-pay | Admitting: Obstetrics and Gynecology

## 2020-02-27 ENCOUNTER — Inpatient Hospital Stay: Payer: No Typology Code available for payment source

## 2020-02-27 ENCOUNTER — Encounter: Payer: Self-pay | Admitting: Emergency Medicine

## 2020-02-27 ENCOUNTER — Emergency Department: Payer: No Typology Code available for payment source

## 2020-02-27 ENCOUNTER — Other Ambulatory Visit: Payer: Self-pay

## 2020-02-27 ENCOUNTER — Inpatient Hospital Stay
Admission: EM | Admit: 2020-02-27 | Discharge: 2020-03-02 | DRG: 871 | Disposition: A | Discharge status: Home / Self Care | Payer: No Typology Code available for payment source | Attending: Family Medicine | Admitting: Family Medicine

## 2020-02-27 DIAGNOSIS — Z91018 Allergy to other foods: Secondary | ICD-10-CM

## 2020-02-27 DIAGNOSIS — N189 Chronic kidney disease, unspecified: Secondary | ICD-10-CM | POA: Diagnosis present

## 2020-02-27 DIAGNOSIS — Z20822 Contact with and (suspected) exposure to covid-19: Secondary | ICD-10-CM | POA: Diagnosis present

## 2020-02-27 DIAGNOSIS — A419 Sepsis, unspecified organism: Principal | ICD-10-CM | POA: Diagnosis present

## 2020-02-27 DIAGNOSIS — Z833 Family history of diabetes mellitus: Secondary | ICD-10-CM

## 2020-02-27 DIAGNOSIS — N7011 Chronic salpingitis: Secondary | ICD-10-CM | POA: Diagnosis present

## 2020-02-27 DIAGNOSIS — N10 Acute tubulo-interstitial nephritis: Secondary | ICD-10-CM | POA: Diagnosis present

## 2020-02-27 DIAGNOSIS — E0810 Diabetes mellitus due to underlying condition with ketoacidosis without coma: Secondary | ICD-10-CM

## 2020-02-27 DIAGNOSIS — N179 Acute kidney failure, unspecified: Secondary | ICD-10-CM | POA: Diagnosis present

## 2020-02-27 DIAGNOSIS — E785 Hyperlipidemia, unspecified: Secondary | ICD-10-CM | POA: Diagnosis present

## 2020-02-27 DIAGNOSIS — E876 Hypokalemia: Secondary | ICD-10-CM | POA: Diagnosis present

## 2020-02-27 DIAGNOSIS — E1010 Type 1 diabetes mellitus with ketoacidosis without coma: Secondary | ICD-10-CM | POA: Diagnosis present

## 2020-02-27 DIAGNOSIS — E109 Type 1 diabetes mellitus without complications: Secondary | ICD-10-CM | POA: Diagnosis present

## 2020-02-27 DIAGNOSIS — R1084 Generalized abdominal pain: Secondary | ICD-10-CM

## 2020-02-27 DIAGNOSIS — R112 Nausea with vomiting, unspecified: Secondary | ICD-10-CM | POA: Diagnosis present

## 2020-02-27 DIAGNOSIS — Z8249 Family history of ischemic heart disease and other diseases of the circulatory system: Secondary | ICD-10-CM

## 2020-02-27 DIAGNOSIS — Z91010 Allergy to peanuts: Secondary | ICD-10-CM

## 2020-02-27 DIAGNOSIS — Z79899 Other long term (current) drug therapy: Secondary | ICD-10-CM

## 2020-02-27 DIAGNOSIS — Z823 Family history of stroke: Secondary | ICD-10-CM

## 2020-02-27 DIAGNOSIS — N12 Tubulo-interstitial nephritis, not specified as acute or chronic: Secondary | ICD-10-CM

## 2020-02-27 DIAGNOSIS — Z794 Long term (current) use of insulin: Secondary | ICD-10-CM

## 2020-02-27 LAB — BASIC METABOLIC PANEL
Anion gap: 29 — ABNORMAL HIGH (ref 5–15)
Anion gap: 20 — ABNORMAL HIGH (ref 5–15)
Anion gap: 16 — ABNORMAL HIGH (ref 5–15)
BUN: 20 mg/dL (ref 6–20)
BUN: 20 mg/dL (ref 6–20)
BUN: 18 mg/dL (ref 6–20)
BUN: 18 mg/dL (ref 6–20)
CO2: 9 mmol/L — ABNORMAL LOW (ref 22–32)
CO2: <7 mmol/L — ABNORMAL LOW (ref 22–32)
CO2: 11 mmol/L — ABNORMAL LOW (ref 22–32)
CO2: 17 mmol/L — ABNORMAL LOW (ref 22–32)
Calcium: 9.6 mg/dL (ref 8.9–10.3)
Calcium: 8.6 mg/dL — ABNORMAL LOW (ref 8.9–10.3)
Calcium: 8.9 mg/dL (ref 8.9–10.3)
Calcium: 9 mg/dL (ref 8.9–10.3)
Chloride: 96 mmol/L — ABNORMAL LOW (ref 98–111)
Chloride: 101 mmol/L (ref 98–111)
Chloride: 108 mmol/L (ref 98–111)
Chloride: 111 mmol/L (ref 98–111)
Creatinine, Ser: 1.33 mg/dL — ABNORMAL HIGH (ref 0.44–1.00)
Creatinine, Ser: 1.2 mg/dL — ABNORMAL HIGH (ref 0.44–1.00)
Creatinine, Ser: 1.05 mg/dL — ABNORMAL HIGH (ref 0.44–1.00)
Creatinine, Ser: 0.97 mg/dL (ref 0.44–1.00)
GFR, Estimated: 57 mL/min — ABNORMAL LOW (ref >60)
GFR, Estimated: >60 mL/min (ref >60)
GFR, Estimated: >60 mL/min (ref >60)
GFR, Estimated: >60 mL/min (ref >60)
Glucose, Bld: 394 mg/dL — ABNORMAL HIGH (ref 70–99)
Glucose, Bld: 388 mg/dL — ABNORMAL HIGH (ref 70–99)
Glucose, Bld: 231 mg/dL — ABNORMAL HIGH (ref 70–99)
Glucose, Bld: 228 mg/dL — ABNORMAL HIGH (ref 70–99)
Potassium: 4.6 mmol/L (ref 3.5–5.1)
Potassium: 5.4 mmol/L — ABNORMAL HIGH (ref 3.5–5.1)
Potassium: 4.1 mmol/L (ref 3.5–5.1)
Potassium: 3.9 mmol/L (ref 3.5–5.1)
Sodium: 134 mmol/L — ABNORMAL LOW (ref 135–145)
Sodium: 135 mmol/L (ref 135–145)
Sodium: 139 mmol/L (ref 135–145)
Sodium: 144 mmol/L (ref 135–145)

## 2020-02-27 LAB — URINALYSIS, COMPLETE (UACMP) WITH MICROSCOPIC
Bacteria, UA: NONE SEEN
Bilirubin Urine: NEGATIVE
Glucose, UA: >=500 mg/dL — AB
Ketones, ur: 80 mg/dL — AB
Nitrite: NEGATIVE
Protein, ur: 100 mg/dL — AB
Specific Gravity, Urine: 1.021 (ref 1.005–1.030)
pH: 5 (ref 5.0–8.0)

## 2020-02-27 LAB — BLOOD GAS, VENOUS
Acid-base deficit: 16 mmol/L — ABNORMAL HIGH (ref 0.0–2.0)
Bicarbonate: 9.4 mmol/L — ABNORMAL LOW (ref 20.0–28.0)
O2 Saturation: 56.8 %
Patient temperature: 37
pCO2, Ven: 22 mmHg — ABNORMAL LOW (ref 44.0–60.0)
pH, Ven: 7.24 — ABNORMAL LOW (ref 7.250–7.430)
pO2, Ven: 36 mmHg (ref 32.0–45.0)

## 2020-02-27 LAB — CBC WITH DIFFERENTIAL/PLATELET
Abs Immature Granulocytes: 0.23 10*3/uL — ABNORMAL HIGH (ref 0.00–0.07)
Basophils Absolute: 0.1 10*3/uL (ref 0.0–0.1)
Basophils Relative: 0 %
Eosinophils Absolute: 0 10*3/uL (ref 0.0–0.5)
Eosinophils Relative: 0 %
HCT: 38.6 % (ref 36.0–46.0)
Hemoglobin: 12.5 g/dL (ref 12.0–15.0)
Immature Granulocytes: 1 %
Lymphocytes Relative: 15 %
Lymphs Abs: 3.1 10*3/uL (ref 0.7–4.0)
MCH: 31.3 pg (ref 26.0–34.0)
MCHC: 32.4 g/dL (ref 30.0–36.0)
MCV: 96.7 fL (ref 80.0–100.0)
Monocytes Absolute: 1.8 10*3/uL — ABNORMAL HIGH (ref 0.1–1.0)
Monocytes Relative: 8 %
Neutro Abs: 15.9 10*3/uL — ABNORMAL HIGH (ref 1.7–7.7)
Neutrophils Relative %: 76 %
Platelets: 307 10*3/uL (ref 150–400)
RBC: 3.99 MIL/uL (ref 3.87–5.11)
RDW: 12.6 % (ref 11.5–15.5)
WBC: 21.1 10*3/uL — ABNORMAL HIGH (ref 4.0–10.5)
nRBC: 0 % (ref 0.0–0.2)

## 2020-02-27 LAB — HCG, QUANTITATIVE, PREGNANCY: hCG, Beta Chain, Quant, S: 2 m[IU]/mL (ref <5)

## 2020-02-27 LAB — CBC
HCT: 38.8 % (ref 36.0–46.0)
Hemoglobin: 12.4 g/dL (ref 12.0–15.0)
MCH: 30.8 pg (ref 26.0–34.0)
MCHC: 32 g/dL (ref 30.0–36.0)
MCV: 96.5 fL (ref 80.0–100.0)
Platelets: 303 10*3/uL (ref 150–400)
RBC: 4.02 MIL/uL (ref 3.87–5.11)
RDW: 12.6 % (ref 11.5–15.5)
WBC: 21.3 10*3/uL — ABNORMAL HIGH (ref 4.0–10.5)
nRBC: 0 % (ref 0.0–0.2)

## 2020-02-27 LAB — LACTIC ACID, PLASMA: Lactic Acid, Venous: 1.8 mmol/L (ref 0.5–1.9)

## 2020-02-27 LAB — CHLAMYDIA/NGC RT PCR (ARMC ONLY)
Chlamydia Tr: NOT DETECTED
N gonorrhoeae: NOT DETECTED

## 2020-02-27 LAB — APTT: aPTT: 36 seconds (ref 24–36)

## 2020-02-27 LAB — CBG MONITORING, ED
Glucose-Capillary: 353 mg/dL — ABNORMAL HIGH (ref 70–99)
Glucose-Capillary: 369 mg/dL — ABNORMAL HIGH (ref 70–99)
Glucose-Capillary: 317 mg/dL — ABNORMAL HIGH (ref 70–99)
Glucose-Capillary: 252 mg/dL — ABNORMAL HIGH (ref 70–99)
Glucose-Capillary: 203 mg/dL — ABNORMAL HIGH (ref 70–99)
Glucose-Capillary: 220 mg/dL — ABNORMAL HIGH (ref 70–99)
Glucose-Capillary: 176 mg/dL — ABNORMAL HIGH (ref 70–99)
Glucose-Capillary: 210 mg/dL — ABNORMAL HIGH (ref 70–99)
Glucose-Capillary: 174 mg/dL — ABNORMAL HIGH (ref 70–99)
Glucose-Capillary: 199 mg/dL — ABNORMAL HIGH (ref 70–99)

## 2020-02-27 LAB — PROTIME-INR
INR: 1.1 (ref 0.8–1.2)
Prothrombin Time: 13.9 seconds (ref 11.4–15.2)

## 2020-02-27 LAB — BETA-HYDROXYBUTYRIC ACID
Beta-Hydroxybutyric Acid: >8 mmol/L — ABNORMAL HIGH (ref 0.05–0.27)
Beta-Hydroxybutyric Acid: 2.72 mmol/L — ABNORMAL HIGH (ref 0.05–0.27)

## 2020-02-27 LAB — SARS CORONAVIRUS 2 (TAT 6-24 HRS): SARS Coronavirus 2: NEGATIVE

## 2020-02-27 LAB — MAGNESIUM: Magnesium: 2.1 mg/dL (ref 1.7–2.4)

## 2020-02-27 LAB — PREGNANCY, URINE: Preg Test, Ur: NEGATIVE

## 2020-02-27 LAB — PROCALCITONIN: Procalcitonin: 0.17 ng/mL

## 2020-02-27 MED ORDER — SODIUM CHLORIDE 0.9 % IV SOLN: 1.0000 g | INTRAVENOUS | Status: DC

## 2020-02-27 MED ORDER — ENOXAPARIN SODIUM 40 MG/0.4ML ~~LOC~~ SOLN
40.0000 mg | SUBCUTANEOUS | Status: DC
  Administered 2020-03-01: 40 mg via SUBCUTANEOUS
  Filled 2020-02-27: qty 0.4

## 2020-02-27 MED ORDER — ACETAMINOPHEN 325 MG PO TABS
650.0000 mg | ORAL_TABLET | Freq: Four times a day (QID) | ORAL | Status: DC | PRN
  Administered 2020-02-29: 650 mg via ORAL
  Filled 2020-02-27: qty 2

## 2020-02-27 MED ORDER — POTASSIUM CHLORIDE 10 MEQ/100ML IV SOLN
10.0000 meq | INTRAVENOUS | Status: AC
  Administered 2020-02-27 (×2): 10 meq via INTRAVENOUS
  Filled 2020-02-27 (×2): qty 100

## 2020-02-27 MED ORDER — INSULIN REGULAR(HUMAN) IN NACL 100-0.9 UT/100ML-% IV SOLN
INTRAVENOUS | Status: DC
  Administered 2020-02-27: 8.5 [IU]/h via INTRAVENOUS
  Filled 2020-02-27: qty 100

## 2020-02-27 MED ORDER — IOHEXOL 300 MG/ML  SOLN
100.0000 mL | Freq: Once | INTRAMUSCULAR | Status: AC | PRN
  Administered 2020-02-27: 100 mL via INTRAVENOUS

## 2020-02-27 MED ORDER — PROMETHAZINE HCL 25 MG/ML IJ SOLN
25.0000 mg | Freq: Four times a day (QID) | INTRAMUSCULAR | Status: DC | PRN
  Administered 2020-02-27 – 2020-02-28 (×3): 25 mg via INTRAVENOUS
  Filled 2020-02-27 (×2): qty 1

## 2020-02-27 MED ORDER — ONDANSETRON HCL 4 MG/2ML IJ SOLN
4.0000 mg | Freq: Once | INTRAMUSCULAR | Status: AC
  Administered 2020-02-27: 4 mg via INTRAVENOUS
  Filled 2020-02-27: qty 2

## 2020-02-27 MED ORDER — DEXTROSE 50 % IV SOLN
0.0000 mL | INTRAVENOUS | Status: DC | PRN
Start: 2020-02-27 — End: 2020-02-28

## 2020-02-27 MED ORDER — POTASSIUM CHLORIDE 10 MEQ/100ML IV SOLN: 10.0000 meq | INTRAVENOUS | Status: DC

## 2020-02-27 MED ORDER — SODIUM CHLORIDE 0.9 % IV SOLN
1.0000 g | Freq: Once | INTRAVENOUS | Status: AC
  Administered 2020-02-27: 1 g via INTRAVENOUS
  Filled 2020-02-27: qty 10

## 2020-02-27 MED ORDER — LACTATED RINGERS IV SOLN: INTRAVENOUS | Status: DC

## 2020-02-27 MED ORDER — LACTATED RINGERS IV BOLUS: 20.0000 mL/kg | Freq: Once | INTRAVENOUS | Status: DC

## 2020-02-27 MED ORDER — SODIUM CHLORIDE 0.9 % IV BOLUS
1000.0000 mL | Freq: Once | INTRAVENOUS | Status: AC
  Administered 2020-02-27: 1000 mL via INTRAVENOUS

## 2020-02-27 MED ORDER — HYDROXYZINE HCL 50 MG/ML IM SOLN
25.0000 mg | Freq: Once | INTRAMUSCULAR | Status: AC
  Administered 2020-02-28: 25 mg via INTRAMUSCULAR
  Filled 2020-02-27 (×2): qty 0.5

## 2020-02-27 MED ORDER — INSULIN GLARGINE 100 UNIT/ML ~~LOC~~ SOLN
7.0000 [IU] | SUBCUTANEOUS | Status: DC
  Administered 2020-02-27: 7 [IU] via SUBCUTANEOUS
  Filled 2020-02-27 (×2): qty 0.07

## 2020-02-27 MED ORDER — DEXTROSE IN LACTATED RINGERS 5 % IV SOLN: INTRAVENOUS | Status: DC

## 2020-02-27 MED ORDER — DEXTROSE 50 % IV SOLN: 0.0000 mL | INTRAVENOUS | Status: DC | PRN

## 2020-02-27 MED ORDER — ONDANSETRON HCL 4 MG PO TABS: 4.0000 mg | ORAL_TABLET | Freq: Four times a day (QID) | ORAL | Status: DC | PRN

## 2020-02-27 MED ORDER — MORPHINE SULFATE (PF) 2 MG/ML IV SOLN: 2.0000 mg | INTRAVENOUS | Status: DC | PRN

## 2020-02-27 MED ORDER — INSULIN ASPART 100 UNIT/ML ~~LOC~~ SOLN: 0.0000 [IU] | Freq: Every day | SUBCUTANEOUS | Status: DC

## 2020-02-27 MED ORDER — SODIUM CHLORIDE 0.9 % IV SOLN
500.0000 mg | Freq: Once | INTRAVENOUS | Status: DC
  Filled 2020-02-27: qty 500

## 2020-02-27 MED ORDER — ONDANSETRON HCL 4 MG/2ML IJ SOLN
INTRAMUSCULAR | Status: AC
  Filled 2020-02-27: qty 2

## 2020-02-27 MED ORDER — INSULIN REGULAR(HUMAN) IN NACL 100-0.9 UT/100ML-% IV SOLN: INTRAVENOUS | Status: DC

## 2020-02-27 MED ORDER — LACTATED RINGERS IV BOLUS
20.0000 mL/kg | Freq: Once | INTRAVENOUS | Status: AC
  Administered 2020-02-27: 1478 mL via INTRAVENOUS

## 2020-02-27 MED ORDER — INSULIN ASPART 100 UNIT/ML ~~LOC~~ SOLN
0.0000 [IU] | Freq: Three times a day (TID) | SUBCUTANEOUS | Status: DC
  Administered 2020-02-28: 15 [IU] via SUBCUTANEOUS
  Administered 2020-02-28: 7 [IU] via SUBCUTANEOUS
  Filled 2020-02-27 (×2): qty 1

## 2020-02-27 MED ORDER — INSULIN ASPART 100 UNIT/ML ~~LOC~~ SOLN
2.0000 [IU] | Freq: Once | SUBCUTANEOUS | Status: DC
  Filled 2020-02-27: qty 1

## 2020-02-27 MED ORDER — SODIUM CHLORIDE 0.9 % IV SOLN
2.0000 g | INTRAVENOUS | Status: DC
  Administered 2020-02-28 – 2020-03-02 (×4): 2 g via INTRAVENOUS
  Filled 2020-02-27: qty 2
  Filled 2020-02-27 (×2): qty 20
  Filled 2020-02-27 (×2): qty 2

## 2020-02-27 MED ORDER — PROMETHAZINE HCL 25 MG/ML IJ SOLN: 25.0000 mg | Freq: Three times a day (TID) | INTRAMUSCULAR | Status: DC | PRN

## 2020-02-27 NOTE — ED Notes (Signed)
Patient to US

## 2020-02-27 NOTE — ED Notes (Signed)
Patient returned from US.

## 2020-02-27 NOTE — ED Provider Notes (Signed)
[facility] Emergency Department Provider Note   ____________________________________________   Event Date/Time   First MD Initiated Contact with Patient 02/27/20 [phone]     (approximate)  I have reviewed the triage vital signs and the nursing notes.   HISTORY  Chief Complaint Emesis    HPI Mandy Patel is a 25 y.o. female think she is in DKA.  She says she has been vomiting for about 2 days and has some crampy abdominal pain worse in the lower abdomen.  She does smell ketotic.  She is tachypneic as well.  And tachycardic.  Abdominal pain is been going on for at least today.  It is mild.  She is not having any diarrhea.  She also has a history of pyelonephritis with her DKA.         Past Medical History:  Diagnosis Date  . DKA (diabetic ketoacidosis) ([place])   . Pyelonephritis 03/2017  . Type 1 diabetes [facility])     diagnosed age 37yo    Patient Active Problem List   Diagnosis Date Noted  . Type 1 diabetes mellitus without complication ([place]) 67/89/3810  . DKA, type 1 ([place]) 06/16/2018  . Hyperkalemia 06/16/2018  . Acute renal failure (ARF) ([place]) 06/16/2018  . Elevated liver enzymes 06/16/2018  . Sepsis ([place]) 09/04/2017  . Type 1 diabetes mellitus with hyperglycemia ([place]) 03/15/2017  . Acute pyelonephritis 03/15/2017  . Acute right flank pain 03/15/2017  . Influenza A 03/06/2017  . Nausea and vomiting 03/06/2017  . DKA (diabetic ketoacidoses) 06/12/2015  . Type I (juvenile type) diabetes mellitus without mention of complication, uncontrolled 04/26/2011  . Preventative health care 12/01/2010  . Obesity 06/19/2010  . Goiter, unspecified 06/19/2010  . HYPERCHOLESTEROLEMIA 04/25/2008    Past Surgical History:  Procedure Laterality Date  . EYE MUSCLE SURGERY  approx 2010   bilat eye surgury, left exotropia worse;Dr Annamaria Boots, opthomology    Prior to Admission medications   Medication Sig Start Date End Date Taking? Authorizing Provider  blood  glucose meter kit and supplies KIT Dispense based on patient and insurance preference. Use up to four times daily as directed. (FOR ICD-9 250.00, 250.01). 06/01/18   Bettey Costa, MD  etonogestrel (NEXPLANON) 68 MG IMPL implant 1 each by Subdermal route once. **Implanted Sept 2016**    [provider]  insulin detemir (LEVEMIR) 100 UNIT/ML injection Inject 0.1 mLs (10 Units total) into the skin at bedtime. 11/04/19 02/02/20  Enzo Bi, MD  insulin lispro (HUMALOG) 100 UNIT/ML injection Inject 0.04 mLs (4 Units total) into the skin 3 (three) times daily with meals. 11/04/19 02/02/20  Enzo Bi, MD    Allergies Peanut oil and Other  Family History  Problem Relation Age of Onset  . Alcohol abuse Other   . Arthritis Other   . Hyperlipidemia Other   . Stroke Other   . Diabetes Other   . Hyperlipidemia Other   . Hypertension Other   . Diabetes Other     Social History Social History   Tobacco Use  . Smoking status: Never Smoker  . Smokeless tobacco: Never Used  Vaping Use  . Vaping Use: Never used  Substance Use Topics  . Alcohol use: No  . Drug use: Yes    Types: Marijuana    Review of Systems  Constitutional: No fever/chills Eyes: No visual changes. ENT: No sore throat. Cardiovascular: Denies chest pain. Respiratory: Denies shortness of breath. Gastrointestinal:  abdominal pain.   nausea,  vomiting.  No diarrhea.  No constipation. Genitourinary: Negative for dysuria. Musculoskeletal: Negative for back pain. Skin: Negative for rash. Neurological: Negative for headaches, focal weakness  ____________________________________________   PHYSICAL EXAM:  VITAL SIGNS: ED Triage Vitals  Enc Vitals Group     BP 02/27/20 0744 117/69     Pulse Rate 02/27/20 0744 (!) 117     Resp 02/27/20 0744 (!) 26     Temp 02/27/20 0744 98 F (36.7 C)     Temp Source 02/27/20 0744 Oral     SpO2 02/27/20 0744 100 %     Weight 02/27/20 0746 163 lb (73.9 kg)     Height 02/27/20 0746 5'  8" (1.727 m)     Head Circumference --      Peak Flow --      Pain Score 02/27/20 0746 7     Pain Loc --      Pain Edu? --      Excl. in GC? --    Constitutional: Alert and oriented.  Looks ill and breathing quickly Eyes: Conjunctivae are normal.  Head: Atraumatic. Nose: No congestion/rhinnorhea. Mouth/Throat: Mucous membranes are moist.  Oropharynx non-erythematous. Neck: No stridor. Cardiovascular: Rapid rate, regular rhythm. Grossly normal heart sounds.  Good peripheral circulation. Respiratory: Rapid respiratory effort.  No retractions. Lungs CTAB. Gastrointestinal: Soft mild tenderness to palpation suprapubically no distention. No abdominal bruits.  Musculoskeletal: No lower extremity tenderness nor edema.  Neurologic:  Normal speech and language. No gross focal neurologic deficits are appreciated.  Skin:  Skin is warm, dry and intact. No rash noted.   ____________________________________________   LABS (all labs ordered are listed, but only abnormal results are displayed)  Labs Reviewed  BASIC METABOLIC PANEL - Abnormal; Notable for the following components:      Result Value   Sodium 134 (*)    Chloride 96 (*)    CO2 9 (*)    Glucose, Bld 394 (*)    Creatinine, Ser 1.33 (*)    GFR, Estimated 57 (*)    Anion gap 29 (*)    All other components within normal limits  CBC - Abnormal; Notable for the following components:   WBC 21.3 (*)    All other components within normal limits  BLOOD GAS, VENOUS - Abnormal; Notable for the following components:   pH, Ven 7.24 (*)    pCO2, Ven 22 (*)    Bicarbonate 9.4 (*)    Acid-base deficit 16.0 (*)    All other components within normal limits  CBG MONITORING, ED - Abnormal; Notable for the following components:   Glucose-Capillary 353 (*)    All other components within normal limits  URINE CULTURE  HCG, QUANTITATIVE, PREGNANCY  PROCALCITONIN  URINALYSIS, COMPLETE (UACMP) WITH MICROSCOPIC  PREGNANCY, URINE  URINALYSIS,  COMPLETE (UACMP) WITH MICROSCOPIC  BETA-HYDROXYBUTYRIC ACID  BETA-HYDROXYBUTYRIC ACID  URINALYSIS, ROUTINE W REFLEX MICROSCOPIC  DIFFERENTIAL  CBG MONITORING, ED  CBG MONITORING, ED  POC URINE PREG, ED  POC URINE PREG, ED   ____________________________________________  EKG   ____________________________________________  RADIOLOGY I, Malinda,  Paul F, personally viewed and evaluated these images (plain radiographs) as part of my medical decision making, as well as reviewing the written report by the radiologist.  ED MD interpretation: Chest x-ray reviewed by me.  I do not see any significant infiltrates.  Official radiology report(s): DG Chest 2 View  Result Date: 02/27/2020 CLINICAL DATA:  DKA.  Vomiting. EXAM: CHEST - 2 VIEW COMPARISON:  Chest x-ray 09/04/2017. FINDINGS: Mediastinum hilar structures normal.   Low lung volumes with mild basilar atelectasis. Very mild bibasilar infiltrates cannot be excluded. No pleural effusion or pneumothorax. Heart size normal. Mild scoliosis thoracic spine. IMPRESSION: Low lung volumes with mild basilar atelectasis. Very mild bibasilar infiltrates cannot be excluded. Electronically Signed   By: Thomas  Register   On: 02/27/2020 09:41   CT ABDOMEN PELVIS W CONTRAST  Result Date: 02/27/2020 CLINICAL DATA:  Abdominal pain with vomiting and elevated white blood cell count EXAM: CT ABDOMEN AND PELVIS WITH CONTRAST TECHNIQUE: Multidetector CT imaging of the abdomen and pelvis was performed using the standard protocol following bolus administration of intravenous contrast. CONTRAST:  100mL OMNIPAQUE IOHEXOL 300 MG/ML  SOLN COMPARISON:  11/01/2019 FINDINGS: Lower chest: No acute abnormality. Hepatobiliary: No focal liver abnormality is seen. No gallstones, gallbladder wall thickening, or biliary dilatation. Pancreas: Unremarkable. No pancreatic ductal dilatation or surrounding inflammatory changes. Spleen: Normal in size without focal abnormality.  Adrenals/Urinary Tract: Adrenal glands are within normal limits. Kidneys again demonstrates some mild fullness of the collecting system particularly on the right although no obstructing lesion is seen. The bladder is well distended. Patchy enhancement of the kidneys is seen which may represent changes of pyelonephritis. This is less prevalent and than that seen on the prior exam. Correlate with urinalysis. Stomach/Bowel: Colon demonstrates some fluid within the cecum although no obstructive changes are seen. The more distal colon is decompressed. No small bowel abnormality is noted. The appendix is well visualized and within normal limits. No inflammatory changes are seen. Small bowel and stomach are unremarkable. Vascular/Lymphatic: No significant vascular findings are present. No enlarged abdominal or pelvic lymph nodes. Reproductive: Uterus is within normal limits. There ovaries appear within normal limits. Dilated tubular structures are noted in a symmetrical fashion bilaterally suspicious for hydrosalpinx. Possibility of underlying infection deserves consideration. These changes have worsened in the interval from the prior exam. Other: No abdominal wall hernia or abnormality. No abdominopelvic ascites. Musculoskeletal: No acute or significant osseous findings. IMPRESSION: Dilated tubular structures in the pelvis bilaterally consistent with dilated fallopian tubes. This may simply represent hydrosalpinx although the possibility of underlying infection deserves consideration. Fullness of the collecting systems of the kidneys bilaterally with some mottled enhancement particularly in the upper lobe suspicious for pyelonephritis. Correlation with urinalysis is recommended. No other focal abnormality is seen. Electronically Signed   By: Mark  Lukens M.D.   On: 02/27/2020 10:42    ____________________________________________   PROCEDURES  Procedure(s) performed (including Critical Care): Critical care time 30  minutes this includes seeing the patient reviewing the old records reviewing the studies and I still have to see the hospitalist.  I also directed the care of her DKA closely.  Procedures   ____________________________________________   INITIAL IMPRESSION / ASSESSMENT AND PLAN / ED COURSE  Patient with white count of 23,000.  In the past these elevated white counts have often been associated with pyelonephritis.  Its possible this could be some other infection.  Patient's only pain is suprapubically.  If her pregnancy test is negative I will plan on getting a CT.  In the meantime we will give her fluids and insulin.  Sugars not very high so we will have to be careful careful with the insulin.    ----------------------------------------- 11:05 AM on 02/27/2020 -----------------------------------------  Patient CT shows mottled appearance of kidney which is consistent with pyelonephritis and consistent with her past history and high white blood count.  There is also what appears to be hydrosalpinx in the pelvis.  This could also   be due to an infection.  I will get her in the hospital.  At that point we can address her GYN potential problems.  She is in the hallway now and I cannot do a pelvic on her now.  I will give her antibiotics we can do a pelvic ultrasound later when she is more stable and consult GYN if necessary.         ____________________________________________   FINAL CLINICAL IMPRESSION(S) / ED DIAGNOSES  Final diagnoses:  Diabetic ketoacidosis without coma associated with diabetes mellitus due to underlying condition (HCC)  Generalized abdominal pain  Hydrosalpinx  Pyelonephritis     ED Discharge Orders    None      *Please note:  Nandana A Dowson was evaluated in Emergency Department on 02/27/2020 for the symptoms described in the history of present illness. She was evaluated in the context of the global COVID-19 pandemic, which necessitated consideration that the  patient might be at risk for infection with the SARS-CoV-2 virus that causes COVID-19. Institutional protocols and algorithms that pertain to the evaluation of patients at risk for COVID-19 are in a state of rapid change based on information released by regulatory bodies including the CDC and federal and state organizations. These policies and algorithms were followed during the patient's care in the ED.  Some ED evaluations and interventions may be delayed as a result of limited staffing during and the pandemic.*   Note:  This document was prepared using Dragon voice recognition software and may include unintentional dictation errors.    Malinda, Paul F, MD 02/27/20 1107  

## 2020-02-27 NOTE — Progress Notes (Signed)
PHARMACY NOTE:  ANTIMICROBIAL DOSAGE ADJUSTMENT  Current antimicrobial regimen includes a mismatch between antimicrobial dosage and indication.  As per policy approved by the Pharmacy & Therapeutics and Medical Executive Committees, the antimicrobial dosage will be adjusted accordingly.  Current antimicrobial dosage:  Ceftriaxone 1g IV q24h  Indication: Intra-abdominal infection  Renal Function:  Estimated Creatinine Clearance: 65.8 mL/min (A) (by C-G formula based on SCr of 1.33 mg/dL (H)). []      On intermittent HD, scheduled: []      On CRRT    Antimicrobial dosage has been changed to:  Ceftriaxone 2g IV q24h  Additional comments:   Thank you for allowing pharmacy to be a part of this patient's care.  , [facility] 02/27/2020 11:58 AM

## 2020-02-27 NOTE — ED Notes (Addendum)
endotool recommends transition to subcutaneous insulin, MD notified.

## 2020-02-27 NOTE — ED Triage Notes (Signed)
Pt states thinks she is in DKA, states has been vomiting x 2 days, states is a type 1 DM, pt also c/o possible pregnancy, states LMP 2 weeks ago.

## 2020-02-27 NOTE — ED Notes (Signed)
Taken to xray.

## 2020-02-27 NOTE — Progress Notes (Signed)
Inpatient Diabetes Program Recommendations  AACE/ADA: New Consensus Statement on Inpatient Glycemic Control (2015)  Target Ranges:  Prepandial:   less than 140 mg/dL      Peak postprandial:   less than 180 mg/dL (1-2 hours)      Critically ill patients:  140 - 180 mg/dL   Lab Results  Component Value Date   GLUCAP 353 (H) 02/27/2020   HGBA1C 12.7 (H) 11/01/2019    Review of Glycemic Control Results for Mandy Patel, Mandy Patel (MRN [id number]) as of 02/27/2020 11:16  Ref. Range 02/27/2020 07:40  Glucose-Capillary Latest Ref Range: 70 - 99 mg/dL 168 (H)   Diabetes history: Type 1 DM Outpatient Diabetes medications:  Levemir 10 units q HS, Humalog 4 units tid with meals Current orders for Inpatient glycemic control:  IV insulin-DKA order set Inpatient Diabetes Program Recommendations:   Agree with current orders.  Once acidosis cleared, will need basal insulin 2 hours prior to d/c of insulin of drip.    Thanks,  Beryl Meager, RN, BC-ADM Inpatient Diabetes Coordinator Pager [phone] (8a-5p)

## 2020-02-27 NOTE — ED Notes (Signed)
Patient denies pain and is resting comfortably.  

## 2020-02-27 NOTE — Progress Notes (Signed)
CODE SEPSIS - PHARMACY COMMUNICATION  **[facility] Antibiotics should be administered within 1 hour of Sepsis diagnosis**  Time Code Sepsis Called/Page Received: 1133  Antibiotics Ordered: ceftriaxone/azithromycin  Time of 1st antibiotic administration: 1049     Pricilla Riffle ,PharmD Clinical Pharmacist  02/27/2020  11:36 AM

## 2020-02-27 NOTE — ED Notes (Signed)
Dr Niu at bedside 

## 2020-02-27 NOTE — ED Notes (Signed)
Phlebotomy at bedside for lab draw. 

## 2020-02-27 NOTE — ED Notes (Signed)
Pt taken to CT.

## 2020-02-27 NOTE — ED Notes (Signed)
Resting in bed with no complaints at this time. Mom at bedside. Pt is alert and oriented.

## 2020-02-27 NOTE — ED Notes (Signed)
Per endotool, no change. Say at 5 units/hr

## 2020-02-27 NOTE — H&P (Signed)
History and Physical    Mandy Patel AFB:[id number] DOB: Jun 14, 1995 DOA: 02/27/2020  Referring MD/NP/PA:   PCP: Patient, No Pcp Per   Patient coming from:  The patient is coming from home.  At baseline, pt is independent for most of ADL.        Chief Complaint: Nausea, vomiting, diarrhea, lower abdominal pain  HPI: Mandy Patel is a 25 y.o. female with medical history significant of hyperlipidemia, type 1 diabetes, DKA, pyelonephritis, who presents with nausea, vomiting, diarrhea and lower abdominal pain.  Patient states that she has been having nausea, vomiting, diarrhea and lower abdominal pain for more than 2 days.  She has vomited few times with nonbilious nonbloody vomiting.  She has had more than 5 times of watery diarrhea since yesterday.  Her lower abdominal pain is constant, sharp, 7 out of 10 severity, nonradiating.  Patient denies fever or chills.  Patient does not have chest pain, shortness breath, cough.  She has mild dysuria, but no burning on urination or increased urinary frequency.  No hematuria.  ED Course: pt was found to have WBC 21.3, DKA (anion gap 29, blood sugar 394, bicarbonate 9), pending covid 19 PCR, negative pregnancy test, urinalysis (hazy appearance, small amount of leukocyte, negative bacteria, WBC 21-50, ketone 80), AKI with creatinine 1.33, BUN 20, lactic acid 1.8, pending bed hydroxybutyric acid level, ABG with pH 7.24, CO2 22, and O2 56.8.  Temperature normal, blood pressure 117/69, heart rate of 117, RR 26, oxygen saturation 100% on room air. Chest x-ray showed low volume, no obvious infiltration. Pt is admitted to stepdown as inpatient. Dr. Amalia Hailey of ob/gyn is consulted.  CT-abd/pelvis: Dilated tubular structures in the pelvis bilaterally consistent with dilated fallopian tubes. This may simply represent hydrosalpinx although the possibility of underlying infection deserves consideration.  Fullness of the collecting systems of the kidneys  bilaterally with some mottled enhancement particularly in the upper lobe suspicious for pyelonephritis. Correlation with urinalysis is recommended. No other focal abnormality is seen.   Review of Systems:   General: no fevers, chills, no body weight gain, has poor appetite, has fatigue HEENT: no blurry vision, hearing changes or sore throat Respiratory: no dyspnea, coughing, wheezing CV: no chest pain, no palpitations GI: has nausea, vomiting, abdominal pain, diarrhea, no constipation GU: has dysuria, no burning on urination, increased urinary frequency, hematuria  Ext: no leg edema Neuro: no unilateral weakness, numbness, or tingling, no vision change or hearing loss Skin: no rash, no skin tear. MSK: No muscle spasm, no deformity, no limitation of range of movement in spin Heme: No easy bruising.  Travel history: No recent long distant travel.  Allergy:  Allergies  Allergen Reactions  . Peanut Oil Other (See Comments)    Reaction to peanuts - gums feel numb and tingling  . Other Swelling    Pecans caused throat swelling and weakness    Past Medical History:  Diagnosis Date  . DKA (diabetic ketoacidosis) ([place])   . Pyelonephritis 03/2017  . Type 1 diabetes [facility])     diagnosed age 45yo    Past Surgical History:  Procedure Laterality Date  . EYE MUSCLE SURGERY  approx 2010   bilat eye surgury, left exotropia worse;Dr Annamaria Boots, opthomology    Social History:  reports that she has never smoked. She has never used smokeless tobacco. She reports current drug use. Drug: Marijuana. She reports that she does not drink alcohol.  Family History:  Family History  Problem Relation Age of Onset  .  Alcohol abuse Other   . Arthritis Other   . Hyperlipidemia Other   . Stroke Other   . Diabetes Other   . Hyperlipidemia Other   . Hypertension Other   . Diabetes Other      Prior to Admission medications   Medication Sig Start Date End Date Taking? Authorizing Provider  blood  glucose meter kit and supplies KIT Dispense based on patient and insurance preference. Use up to four times daily as directed. (FOR ICD-9 250.00, 250.01). 06/01/18   Bettey Costa, MD  etonogestrel (NEXPLANON) 68 MG IMPL implant 1 each by Subdermal route once. **Implanted Sept 2016**    [provider]  insulin detemir (LEVEMIR) 100 UNIT/ML injection Inject 0.1 mLs (10 Units total) into the skin at bedtime. 11/04/19 02/02/20  Enzo Bi, MD  insulin lispro (HUMALOG) 100 UNIT/ML injection Inject 0.04 mLs (4 Units total) into the skin 3 (three) times daily with meals. 11/04/19 02/02/20  Enzo Bi, MD    Physical Exam: Vitals:   02/27/20 0746 02/27/20 1210 02/27/20 1337 02/27/20 1515  BP:  (!) 145/89 123/69 126/79  Pulse:  (!) 137 (!) 133 (!) 123  Resp:  (!) 28 (!) 26 (!) 24  Temp:      TempSrc:      SpO2:  99% 100% 99%  Weight: 73.9 kg     Height: 5' 8"  (1.727 m)      General: Not in acute distress. Dry mucous membrane HEENT:       Eyes: PERRL, EOMI, no scleral icterus.       ENT: No discharge from the ears and nose, no pharynx injection, no tonsillar enlargement.        Neck: No JVD, no bruit, no mass felt. Heme: No neck lymph node enlargement. Cardiac: S1/S2, RRR, No murmurs, No gallops or rubs. Respiratory: No rales, wheezing, rhonchi or rubs. GI: Soft, nondistended, has tenderness in lower abdomen, no rebound pain, no organomegaly, BS present. GU: No hematuria Ext: No pitting leg edema bilaterally. 1+DP/PT pulse bilaterally. Musculoskeletal: No joint deformities, No joint redness or warmth, no limitation of ROM in spin. Skin: No rashes.  Neuro: Alert, oriented X3, cranial nerves II-XII grossly intact, moves all extremities normally. Psych: Patient is not psychotic, no suicidal or hemocidal ideation.  Labs on Admission: I have personally reviewed following labs and imaging studies  CBC: Recent Labs  Lab 02/27/20 0750 02/27/20 1135  WBC 21.3* 21.1*  NEUTROABS  --  15.9*  HGB  12.4 12.5  HCT 38.8 38.6  MCV 96.5 96.7  PLT 303 102   Basic Metabolic Panel: Recent Labs  Lab 02/27/20 0750 02/27/20 1135 02/27/20 1550  NA 134* 135 139  K 4.6 5.4* 4.1  CL 96* 101 108  CO2 9* <7* 11*  GLUCOSE 394* 388* 231*  BUN 20 20 18   CREATININE 1.33* 1.20* 1.05*  CALCIUM 9.6 8.6* 8.9  MG  --   --  2.1   GFR: Estimated Creatinine Clearance: 83.3 mL/min (A) (by C-G formula based on SCr of 1.05 mg/dL (H)). Liver Function Tests: No results for input(s): AST, ALT, ALKPHOS, BILITOT, PROT, ALBUMIN in the last 168 hours. No results for input(s): LIPASE, AMYLASE in the last 168 hours. No results for input(s): AMMONIA in the last 168 hours. Coagulation Profile: Recent Labs  Lab 02/27/20 1209  INR 1.1   Cardiac Enzymes: No results for input(s): CKTOTAL, CKMB, CKMBINDEX, TROPONINI in the last 168 hours. BNP (last 3 results) No results for input(s): PROBNP in  the last 8760 hours. HbA1C: No results for input(s): HGBA1C in the last 72 hours. CBG: Recent Labs  Lab 02/27/20 0740 02/27/20 1140 02/27/20 1247 02/27/20 1418 02/27/20 1519  GLUCAP 353* 369* 317* 252* 203*   Lipid Profile: No results for input(s): CHOL, HDL, LDLCALC, TRIG, CHOLHDL, LDLDIRECT in the last 72 hours. Thyroid Function Tests: No results for input(s): TSH, T4TOTAL, FREET4, T3FREE, THYROIDAB in the last 72 hours. Anemia Panel: No results for input(s): VITAMINB12, FOLATE, FERRITIN, TIBC, IRON, RETICCTPCT in the last 72 hours. Urine analysis:    Component Value Date/Time   COLORURINE YELLOW (A) 11/01/2019 0726   APPEARANCEUR CLOUDY (A) 11/01/2019 0726   LABSPEC 1.027 11/01/2019 0726   PHURINE 5.0 11/01/2019 0726   GLUCOSEU >=500 (A) 11/01/2019 0726   GLUCOSEU >=1000 12/01/2010 1051   HGBUR MODERATE (A) 11/01/2019 0726   BILIRUBINUR NEGATIVE 11/01/2019 0726   KETONESUR 20 (A) 11/01/2019 0726   PROTEINUR 30 (A) 11/01/2019 0726   UROBILINOGEN 0.2 12/01/2010 1051   NITRITE POSITIVE (A)  11/01/2019 0726   LEUKOCYTESUR SMALL (A) 11/01/2019 0726   Sepsis Labs: @LABRCNTIP (procalcitonin:4,lacticidven:4) )No results found for this or any previous visit (from the past 240 hour(s)).   Radiological Exams on Admission: DG Chest 2 View  Result Date: 02/27/2020 CLINICAL DATA:  DKA.  Vomiting. EXAM: CHEST - 2 VIEW COMPARISON:  Chest x-ray 09/04/2017. FINDINGS: Mediastinum hilar structures normal. Low lung volumes with mild basilar atelectasis. Very mild bibasilar infiltrates cannot be excluded. No pleural effusion or pneumothorax. Heart size normal. Mild scoliosis thoracic spine. IMPRESSION: Low lung volumes with mild basilar atelectasis. Very mild bibasilar infiltrates cannot be excluded. Electronically Signed   By: Marcello Moores  Register   On: 02/27/2020 09:41   CT ABDOMEN PELVIS W CONTRAST  Result Date: 02/27/2020 CLINICAL DATA:  Abdominal pain with vomiting and elevated white blood cell count EXAM: CT ABDOMEN AND PELVIS WITH CONTRAST TECHNIQUE: Multidetector CT imaging of the abdomen and pelvis was performed using the standard protocol following bolus administration of intravenous contrast. CONTRAST:  125m OMNIPAQUE IOHEXOL 300 MG/ML  SOLN COMPARISON:  11/01/2019 FINDINGS: Lower chest: No acute abnormality. Hepatobiliary: No focal liver abnormality is seen. No gallstones, gallbladder wall thickening, or biliary dilatation. Pancreas: Unremarkable. No pancreatic ductal dilatation or surrounding inflammatory changes. Spleen: Normal in size without focal abnormality. Adrenals/Urinary Tract: Adrenal glands are within normal limits. Kidneys again demonstrates some mild fullness of the collecting system particularly on the right although no obstructing lesion is seen. The bladder is well distended. Patchy enhancement of the kidneys is seen which may represent changes of pyelonephritis. This is less prevalent and than that seen on the prior exam. Correlate with urinalysis. Stomach/Bowel: Colon demonstrates  some fluid within the cecum although no obstructive changes are seen. The more distal colon is decompressed. No small bowel abnormality is noted. The appendix is well visualized and within normal limits. No inflammatory changes are seen. Small bowel and stomach are unremarkable. Vascular/Lymphatic: No significant vascular findings are present. No enlarged abdominal or pelvic lymph nodes. Reproductive: Uterus is within normal limits. There ovaries appear within normal limits. Dilated tubular structures are noted in a symmetrical fashion bilaterally suspicious for hydrosalpinx. Possibility of underlying infection deserves consideration. These changes have worsened in the interval from the prior exam. Other: No abdominal wall hernia or abnormality. No abdominopelvic ascites. Musculoskeletal: No acute or significant osseous findings. IMPRESSION: Dilated tubular structures in the pelvis bilaterally consistent with dilated fallopian tubes. This may simply represent hydrosalpinx although the possibility of underlying infection  deserves consideration. Fullness of the collecting systems of the kidneys bilaterally with some mottled enhancement particularly in the upper lobe suspicious for pyelonephritis. Correlation with urinalysis is recommended. No other focal abnormality is seen. Electronically Signed   By: Inez Catalina M.D.   On: 02/27/2020 10:42     EKG: I have personally reviewed.  Sinus rhythm, QTC 578, low voltage, nonspecific T wave change  Assessment/Plan Principal Problem:   DKA, type 1 (HCC) Active Problems:   Nausea vomiting and diarrhea   Acute pyelonephritis   Sepsis ([place])   Type 1 diabetes mellitus without complication ([place])   AKI (acute kidney injury) ([place])   DKA, type 1 ([place]): - Admit to stepdown  - 1L of NS bolus and 1.4L of LR bolus in ED - start DKA protocol with BMP q4h - IVF: LR at 125 cc/h, will switch to D5-LR at 125 cc/h when CBG<250 - replete K as needed - Zofran prn nausea   - NPO  - consult to diabetic educator and case manager - blood culture x 2 - consult to diabetic educator  Sepsis due to acute pyelonephritis: lactic acid 1.8.  Patient meets criteria for sepsis with leukocytosis, tachycardia with heart rate of 117, RR 26. -Rocephin Follow-up blood culture and urine culture -will get Procalcitonin and trend lactic acid levels per sepsis protocol. -IVF: as above  Nausea vomiting and diarrhea: -prn Phenergan -IV fluid as above -Check C. difficile PCR and GI pathogen panel  Type 1 diabetes mellitus without complication ([place]): -on DKA protocol now  AKI (acute kidney injury) ([place]):  -Avoid using renal toxic medications -IV fluid as above  Questionable small bilateral hydrosalpinx: CT scan showed possible hydrosalpinx.  Consulted Dr. Amalia Hailey of OB/GYN, who recommended to get pelvis ultrasound, which showed no acute findings and ouestionable small bilateral hydrosalpinx.  Dr. Amalia Hailey reviewed ultrasound, and did not believe this is a part of her clinical picture at this time.  He would recommend to get gonorrhea and chlamydia test and follow-up with with Dr. Amalia Hailey in clinic. -GC/Chlamydia prob       DVT ppx:  SQ Lovenox Code Status: Full code Family Communication: not done, no family member is at bed side.  Disposition Plan:  Anticipate discharge back to previous environment Consults called: none Admission status and Level of care: Stepdown as inpt     Status is: Inpatient  Remains inpatient appropriate because:Inpatient level of care appropriate due to severity of illness   Dispo: The patient is from: Home              Anticipated d/c is to: Home              Anticipated d/c date is: 2 days              Patient currently is not medically stable to d/c.   Difficult to place patient No         Date of Service 02/27/2020    [facility]   If 7PM-7AM, please contact night-coverage [email or web address] 02/27/2020, 4:50  PM

## 2020-02-27 NOTE — Sepsis Progress Note (Signed)
Notified bedside nurse of need to draw lactic acid and blood cultures.  

## 2020-02-27 NOTE — ED Notes (Signed)
Patient vomiting again.

## 2020-02-28 DIAGNOSIS — N10 Acute tubulo-interstitial nephritis: Secondary | ICD-10-CM

## 2020-02-28 DIAGNOSIS — R112 Nausea with vomiting, unspecified: Secondary | ICD-10-CM

## 2020-02-28 DIAGNOSIS — R197 Diarrhea, unspecified: Secondary | ICD-10-CM

## 2020-02-28 DIAGNOSIS — E1010 Type 1 diabetes mellitus with ketoacidosis without coma: Secondary | ICD-10-CM

## 2020-02-28 DIAGNOSIS — N179 Acute kidney failure, unspecified: Secondary | ICD-10-CM

## 2020-02-28 LAB — BASIC METABOLIC PANEL
Anion gap: 14 (ref 5–15)
Anion gap: 16 — ABNORMAL HIGH (ref 5–15)
Anion gap: 18 — ABNORMAL HIGH (ref 5–15)
BUN: 19 mg/dL (ref 6–20)
BUN: 21 mg/dL — ABNORMAL HIGH (ref 6–20)
BUN: 21 mg/dL — ABNORMAL HIGH (ref 6–20)
CO2: 20 mmol/L — ABNORMAL LOW (ref 22–32)
CO2: 18 mmol/L — ABNORMAL LOW (ref 22–32)
CO2: 16 mmol/L — ABNORMAL LOW (ref 22–32)
Calcium: 9.1 mg/dL (ref 8.9–10.3)
Calcium: 9 mg/dL (ref 8.9–10.3)
Calcium: 8.6 mg/dL — ABNORMAL LOW (ref 8.9–10.3)
Chloride: 108 mmol/L (ref 98–111)
Chloride: 106 mmol/L (ref 98–111)
Chloride: 104 mmol/L (ref 98–111)
Creatinine, Ser: 1.05 mg/dL — ABNORMAL HIGH (ref 0.44–1.00)
Creatinine, Ser: 1 mg/dL (ref 0.44–1.00)
Creatinine, Ser: 0.98 mg/dL (ref 0.44–1.00)
GFR, Estimated: >60 mL/min (ref >60)
GFR, Estimated: >60 mL/min (ref >60)
GFR, Estimated: >60 mL/min (ref >60)
Glucose, Bld: 180 mg/dL — ABNORMAL HIGH (ref 70–99)
Glucose, Bld: 337 mg/dL — ABNORMAL HIGH (ref 70–99)
Glucose, Bld: 253 mg/dL — ABNORMAL HIGH (ref 70–99)
Potassium: 3.9 mmol/L (ref 3.5–5.1)
Potassium: 4.1 mmol/L (ref 3.5–5.1)
Potassium: 3.8 mmol/L (ref 3.5–5.1)
Sodium: 142 mmol/L (ref 135–145)
Sodium: 140 mmol/L (ref 135–145)
Sodium: 138 mmol/L (ref 135–145)

## 2020-02-28 LAB — GLUCOSE, CAPILLARY
Glucose-Capillary: 168 mg/dL — ABNORMAL HIGH (ref 70–99)
Glucose-Capillary: 275 mg/dL — ABNORMAL HIGH (ref 70–99)
Glucose-Capillary: 204 mg/dL — ABNORMAL HIGH (ref 70–99)
Glucose-Capillary: 192 mg/dL — ABNORMAL HIGH (ref 70–99)
Glucose-Capillary: 204 mg/dL — ABNORMAL HIGH (ref 70–99)
Glucose-Capillary: 172 mg/dL — ABNORMAL HIGH (ref 70–99)

## 2020-02-28 LAB — PROCALCITONIN: Procalcitonin: 0.11 ng/mL

## 2020-02-28 LAB — CBG MONITORING, ED
Glucose-Capillary: 167 mg/dL — ABNORMAL HIGH (ref 70–99)
Glucose-Capillary: 241 mg/dL — ABNORMAL HIGH (ref 70–99)
Glucose-Capillary: 267 mg/dL — ABNORMAL HIGH (ref 70–99)
Glucose-Capillary: 339 mg/dL — ABNORMAL HIGH (ref 70–99)

## 2020-02-28 LAB — LACTIC ACID, PLASMA: Lactic Acid, Venous: 1.5 mmol/L (ref 0.5–1.9)

## 2020-02-28 LAB — HEMOGLOBIN A1C
Hgb A1c MFr Bld: 13 % — ABNORMAL HIGH (ref 4.8–5.6)
Mean Plasma Glucose: 326.4 mg/dL

## 2020-02-28 LAB — BETA-HYDROXYBUTYRIC ACID
Beta-Hydroxybutyric Acid: 2.44 mmol/L — ABNORMAL HIGH (ref 0.05–0.27)
Beta-Hydroxybutyric Acid: 3.97 mmol/L — ABNORMAL HIGH (ref 0.05–0.27)

## 2020-02-28 LAB — MRSA PCR SCREENING: MRSA by PCR: NEGATIVE

## 2020-02-28 MED ORDER — METOCLOPRAMIDE HCL 5 MG/ML IJ SOLN
5.0000 mg | Freq: Once | INTRAMUSCULAR | Status: AC
  Administered 2020-02-28: 5 mg via INTRAVENOUS
  Filled 2020-02-28: qty 2

## 2020-02-28 MED ORDER — CHLORHEXIDINE GLUCONATE CLOTH 2 % EX PADS
6.0000 | MEDICATED_PAD | Freq: Every day | CUTANEOUS | Status: DC
  Administered 2020-02-28 – 2020-03-02 (×3): 6 via TOPICAL

## 2020-02-28 MED ORDER — DEXTROSE-NACL 5-0.45 % IV SOLN: INTRAVENOUS | Status: DC

## 2020-02-28 MED ORDER — METOPROLOL TARTRATE 25 MG PO TABS
25.0000 mg | ORAL_TABLET | Freq: Two times a day (BID) | ORAL | Status: DC
  Administered 2020-02-28 – 2020-03-02 (×6): 25 mg via ORAL
  Filled 2020-02-28 (×7): qty 1

## 2020-02-28 MED ORDER — SODIUM CHLORIDE 0.9 % IV SOLN: INTRAVENOUS | Status: DC

## 2020-02-28 MED ORDER — INSULIN REGULAR(HUMAN) IN NACL 100-0.9 UT/100ML-% IV SOLN
INTRAVENOUS | Status: DC
  Administered 2020-02-28: 8.5 [IU]/h via INTRAVENOUS
  Filled 2020-02-28 (×3): qty 100

## 2020-02-28 MED ORDER — DEXTROSE 50 % IV SOLN: 0.0000 mL | INTRAVENOUS | Status: DC | PRN

## 2020-02-28 NOTE — Plan of Care (Signed)

## 2020-02-28 NOTE — ED Notes (Signed)
bg 339

## 2020-02-28 NOTE — Progress Notes (Signed)
Patient flagged a Yellow MEWS at 1407 for HR of 122. Notified MD and charge nurse. MD ordered metoprolol. HR rechecked later and came down to 102 at 1748. Patient is now back to a green MEWS.

## 2020-02-28 NOTE — Progress Notes (Signed)
[place] at [facility]   PATIENT NAME: Mandy Patel    MR#:  [id number]  DATE OF BIRTH:  [birth year]  SUBJECTIVE:  CHIEF COMPLAINT:   Chief Complaint  Patient presents with  . Emesis  very thirsty and having some abd pain, no vomiting  REVIEW OF SYSTEMS:  Review of Systems  Constitutional: Positive for malaise/fatigue. Negative for diaphoresis, fever and weight loss.  HENT: Negative for ear discharge, ear pain, hearing loss, nosebleeds, sore throat and tinnitus.   Eyes: Negative for blurred vision and pain.  Respiratory: Negative for cough, hemoptysis, shortness of breath and wheezing.   Cardiovascular: Negative for chest pain, palpitations, orthopnea and leg swelling.  Gastrointestinal: Positive for abdominal pain, diarrhea, nausea and vomiting. Negative for blood in stool, constipation and heartburn.  Genitourinary: Positive for dysuria. Negative for frequency and urgency.  Musculoskeletal: Negative for back pain and myalgias.  Skin: Negative for itching and rash.  Neurological: Negative for dizziness, tingling, tremors, focal weakness, seizures, weakness and headaches.  Psychiatric/Behavioral: Negative for depression. The patient is not nervous/anxious.    DRUG ALLERGIES:   Allergies  Allergen Reactions  . Peanut Oil Other (See Comments)    Reaction to peanuts - gums feel numb and tingling  . Other Swelling    Pecans caused throat swelling and weakness   VITALS:  Blood pressure 117/73, pulse (!) 122, temperature 97.7 F (36.5 C), temperature source Oral, resp. rate 16, height 5\' 8"  (1.727 m), weight 73.9 kg, last menstrual period 02/13/2020, SpO2 99 %. PHYSICAL EXAMINATION:  Physical Exam HENT:     Head: Normocephalic and atraumatic.  Eyes:     Extraocular Movements: EOM normal.     Conjunctiva/sclera: Conjunctivae normal.     Pupils: Pupils are equal, round, and reactive to light.  Neck:     Thyroid: No thyromegaly.     Trachea: No tracheal  deviation.  Cardiovascular:     Rate and Rhythm: Normal rate and regular rhythm.     Heart sounds: Normal heart sounds.  Pulmonary:     Effort: Pulmonary effort is normal. No respiratory distress.     Breath sounds: Normal breath sounds. No wheezing.  Chest:     Chest wall: No tenderness.  Abdominal:     General: Bowel sounds are normal. There is no distension.     Palpations: Abdomen is soft.     Tenderness: There is abdominal tenderness in the suprapubic area.  Musculoskeletal:        General: Normal range of motion.     Cervical back: Normal range of motion and neck supple.  Skin:    General: Skin is warm and dry.     Findings: No rash.  Neurological:     Mental Status: She is alert and oriented to person, place, and time.     Cranial Nerves: No cranial nerve deficit.    LABORATORY PANEL:  Female CBC Recent Labs  Lab 02/27/20 1135  WBC 21.1*  HGB 12.5  HCT 38.6  PLT 307   ------------------------------------------------------------------------------------------------------------------ Chemistries  Recent Labs  Lab 02/27/20 1550 02/27/20 1912 02/28/20 1122  NA 139   < > 138  K 4.1   < > 3.8  CL 108   < > 104  CO2 11*   < > 16*  GLUCOSE 231*   < > 253*  BUN 18   < > 21*  CREATININE 1.05*   < > 0.98  CALCIUM 8.9   < > 8.6*  MG 2.1  --   --    < > =  values in this interval not displayed.   RADIOLOGY:  No results found. ASSESSMENT AND PLAN:  25 y.o. female with medical history significant of hyperlipidemia, type 1 diabetes, DKA, pyelonephritis, who presents with nausea, vomiting, diarrhea and lower abdominal pain.  DKA, type 1 (HCC): - as her Beta hydroxy still elevated (3.97) - continue insulin drip, once < 0.5, transition to Lantus 10 units - follow recommendation of diabetic educator   Sepsis due to acute pyelonephritis: lactic acid 1.8.  Patient meets criteria for sepsis with leukocytosis, tachycardia with heart rate of 117, RR 26. -continue  Rocephin Follow-up blood culture and urine culture -continue IVF  Nausea vomiting and diarrhea: -prn Phenergan. No vomiting/diarrhea while in the hospital so far -IV fluid as above -Check C. difficile PCR and GI pathogen panel  Type 1 diabetes mellitus without complication (HCC): -on DKA protocol now  AKI (acute kidney injury) (HCC):  -Avoid using renal toxic medications -IV fluid as above  Questionable small bilateral hydrosalpinx: CT scan showed possible hydrosalpinx.  Consulted Dr. Logan Bores of OB/GYN, who recommended to get pelvis ultrasound, which showed no acute findings and ouestionable small bilateral hydrosalpinx.  Dr. Logan Bores reviewed ultrasound, and did not believe this is a part of her clinical picture at this time.  - follow-up with with Dr. Logan Bores in clinic. -GC/Chlamydia neg   Body mass index is 24.78 kg/m.    Status is: Inpatient  Remains inpatient appropriate because:Hemodynamically unstable and IV treatments appropriate due to intensity of illness or inability to take PO   Dispo: The patient is from: Home              Anticipated d/c is to: Home              Anticipated d/c date is: 2 days              Patient currently is not medically stable to d/c.   Difficult to place patient No   DVT prophylaxis:       enoxaparin (LOVENOX) injection 40 mg Start: 02/27/20 1215     Family Communication: ("discussed with patient")   All the records are reviewed and case discussed with Care Management/Social Worker. Management plans discussed with the patient, nursing and they are in agreement.  CODE STATUS: Full Code Level of care: Stepdown  TOTAL TIME TAKING CARE OF THIS PATIENT: 35 minutes.   More than 50% of the time was spent in counseling/coordination of care: YES  POSSIBLE D/C IN 1-2 DAYS, DEPENDING ON CLINICAL CONDITION.   Delfino Lovett M.D on 02/28/2020 at 5:47 PM  [facility]   CC: Primary care physician; Patient, No Pcp Per  Note: This  dictation was prepared with Dragon dictation along with smaller phrase technology. Any transcriptional errors that result from this process are unintentional.

## 2020-02-28 NOTE — Progress Notes (Addendum)
Inpatient Diabetes Program Recommendations  AACE/ADA: New Consensus Statement on Inpatient Glycemic Control (2015)  Target Ranges:  Prepandial:   less than 140 mg/dL      Peak postprandial:   less than 180 mg/dL (1-2 hours)      Critically ill patients:  140 - 180 mg/dL   Lab Results  Component Value Date   GLUCAP 339 (H) 02/28/2020   HGBA1C 12.7 (H) 11/01/2019    Review of Glycemic Control Results for AMAIYA, SCRUTON (MRN [id number]) as of 02/28/2020 10:46  Ref. Range 02/27/2020 19:24 02/27/2020 21:15 02/27/2020 22:28 02/28/2020 00:48 02/28/2020 04:57 02/28/2020 07:34  Glucose-Capillary Latest Ref Range: 70 - 99 mg/dL 833 (H) 825 (H) 053 (H) 167 (H) 267 (H) 339 (H)  Diabetes history: Type 1 DM Outpatient Diabetes medications:  Levemir 10 units q HS, Humalog 4 units tid with meals Current orders for Inpatient glycemic control:  Lantus 7 units daily Novolog resistant tid with meals and HS  Inpatient Diabetes Program Recommendations:    Patient transitioned off insulin drip overnight.  Blood sugars increased throughout the night.  Consider rechecking BMP at noon.  If patient is not acidotic, please reduce Novolog correction to very sensitive q 4 hours and increase Lantus to 10 units q HS.  Patient will need Novolog meal coverage once she is able to eat.    Thanks  Beryl Meager, RN, BC-ADM Inpatient Diabetes Coordinator Pager [phone] (8a-5p)  1/27 addendum: Note plans to restart insulin drip due to high Beta hydroxybutyrate- Once beta hydroxybutyrate < 0.5, consider transition off insulin drip to Lantus 10 units (2 hours prior to d/c of insulin drip), Novolog sensitive correction q 4 hours, and Novolog 2 units tid with meals (to cover CHO from meals-Hold if patient eats less than 50% or NPO).

## 2020-02-29 LAB — GLUCOSE, CAPILLARY
Glucose-Capillary: 110 mg/dL — ABNORMAL HIGH (ref 70–99)
Glucose-Capillary: 115 mg/dL — ABNORMAL HIGH (ref 70–99)
Glucose-Capillary: 118 mg/dL — ABNORMAL HIGH (ref 70–99)
Glucose-Capillary: 126 mg/dL — ABNORMAL HIGH (ref 70–99)
Glucose-Capillary: 134 mg/dL — ABNORMAL HIGH (ref 70–99)
Glucose-Capillary: 143 mg/dL — ABNORMAL HIGH (ref 70–99)
Glucose-Capillary: 156 mg/dL — ABNORMAL HIGH (ref 70–99)
Glucose-Capillary: 171 mg/dL — ABNORMAL HIGH (ref 70–99)
Glucose-Capillary: 190 mg/dL — ABNORMAL HIGH (ref 70–99)
Glucose-Capillary: 226 mg/dL — ABNORMAL HIGH (ref 70–99)

## 2020-02-29 LAB — MAGNESIUM: Magnesium: 1.8 mg/dL (ref 1.7–2.4)

## 2020-02-29 LAB — BASIC METABOLIC PANEL
Anion gap: 10 (ref 5–15)
BUN: 11 mg/dL (ref 6–20)
CO2: 22 mmol/L (ref 22–32)
Calcium: 8.2 mg/dL — ABNORMAL LOW (ref 8.9–10.3)
Chloride: 103 mmol/L (ref 98–111)
Creatinine, Ser: 0.6 mg/dL (ref 0.44–1.00)
GFR, Estimated: >60 mL/min (ref >60)
Glucose, Bld: 149 mg/dL — ABNORMAL HIGH (ref 70–99)
Potassium: 3.1 mmol/L — ABNORMAL LOW (ref 3.5–5.1)
Sodium: 135 mmol/L (ref 135–145)

## 2020-02-29 LAB — PROCALCITONIN: Procalcitonin: <0.1 ng/mL

## 2020-02-29 LAB — URINE CULTURE

## 2020-02-29 MED ORDER — INSULIN GLARGINE 100 UNIT/ML ~~LOC~~ SOLN
7.0000 [IU] | Freq: Every day | SUBCUTANEOUS | Status: DC
  Filled 2020-02-29: qty 0.07

## 2020-02-29 MED ORDER — INSULIN ASPART 100 UNIT/ML ~~LOC~~ SOLN
0.0000 [IU] | Freq: Every day | SUBCUTANEOUS | Status: DC
  Administered 2020-02-29 – 2020-03-01 (×2): 2 [IU] via SUBCUTANEOUS
  Filled 2020-02-29 (×2): qty 1

## 2020-02-29 MED ORDER — POTASSIUM CHLORIDE CRYS ER 20 MEQ PO TBCR
40.0000 meq | EXTENDED_RELEASE_TABLET | Freq: Once | ORAL | Status: AC
  Administered 2020-02-29: 40 meq via ORAL
  Filled 2020-02-29: qty 2

## 2020-02-29 MED ORDER — INSULIN GLARGINE 100 UNIT/ML ~~LOC~~ SOLN
7.0000 [IU] | Freq: Every day | SUBCUTANEOUS | Status: DC
  Administered 2020-02-29 – 2020-03-01 (×2): 7 [IU] via SUBCUTANEOUS
  Filled 2020-02-29 (×2): qty 0.07

## 2020-02-29 MED ORDER — INSULIN ASPART 100 UNIT/ML ~~LOC~~ SOLN
2.0000 [IU] | Freq: Three times a day (TID) | SUBCUTANEOUS | Status: DC
  Administered 2020-02-29: 2 [IU] via SUBCUTANEOUS

## 2020-02-29 MED ORDER — INSULIN ASPART 100 UNIT/ML ~~LOC~~ SOLN
0.0000 [IU] | Freq: Three times a day (TID) | SUBCUTANEOUS | Status: DC
  Administered 2020-02-29 (×2): 4 [IU] via SUBCUTANEOUS
  Filled 2020-02-29 (×2): qty 1

## 2020-02-29 MED ORDER — INSULIN ASPART 100 UNIT/ML ~~LOC~~ SOLN
2.0000 [IU] | Freq: Three times a day (TID) | SUBCUTANEOUS | Status: DC
  Administered 2020-03-01 – 2020-03-02 (×4): 2 [IU] via SUBCUTANEOUS
  Filled 2020-02-29 (×5): qty 1

## 2020-02-29 MED ORDER — INSULIN ASPART 100 UNIT/ML ~~LOC~~ SOLN
0.0000 [IU] | Freq: Three times a day (TID) | SUBCUTANEOUS | Status: DC
  Administered 2020-03-01: 2 [IU] via SUBCUTANEOUS
  Administered 2020-03-01: 5 [IU] via SUBCUTANEOUS
  Administered 2020-03-01 – 2020-03-02 (×2): 3 [IU] via SUBCUTANEOUS
  Filled 2020-02-29 (×5): qty 1

## 2020-02-29 MED ORDER — INSULIN REGULAR HUMAN 100 UNIT/ML IJ SOLN
2.0000 [IU] | Freq: Three times a day (TID) | INTRAMUSCULAR | Status: DC
  Filled 2020-02-29: qty 3

## 2020-02-29 NOTE — Progress Notes (Signed)
Inpatient Diabetes Program Recommendations  AACE/ADA: New Consensus Statement on Inpatient Glycemic Control (2015)  Target Ranges:  Prepandial:   less than 140 mg/dL      Peak postprandial:   less than 180 mg/dL (1-2 hours)      Critically ill patients:  140 - 180 mg/dL   Results for Mandy Patel, Mandy Patel (MRN [id number]) as of 02/29/2020 07:24  Ref. Range 02/28/2020 18:51 02/28/2020 19:56 02/28/2020 21:05 02/28/2020 22:03 02/28/2020 23:09 02/29/2020 00:04 02/29/2020 01:02 02/29/2020 02:04 02/29/2020 03:07 02/29/2020 04:07 02/29/2020 05:16  Glucose-Capillary Latest Ref Range: 70 - 99 mg/dL 779 (H)  IV Insulin Drip restarted 204 (H) 192 (H) 204 (H) 172 (H) 143 (H) 126 (H) 118 (H) 110 (H)  7 units LEVEMIR given 115 (H) 134 (H)  IV Insulin Drip OFF    Home DM Meds: Levemir 10 units QHS       Humalog 4 units TID  Current Orders: Levemir 7 units Daily      Novolog Resistant Correction Scale/ SSI (0-20 units) TID AC     MD- Note patient transitioning back to SQ Insulin this AM.  Please consider the following:  1. Reduce Novolog SSI to the Sensitive (0-9 unit) scale TID ac + hs  2. Start Novolog Meal Coverage: Novolog 2 units TID with meals (HOLD if pt NPO or eats less than 50% of meal)     --Will follow patient during hospitalization--  Ambrose Finland RN, MSN, CDE Diabetes Coordinator Inpatient Glycemic Control Team Team Pager: [phone] (8a-5p)

## 2020-02-29 NOTE — Progress Notes (Addendum)
[place] at [facility]   PATIENT NAME: Mandy Patel    MR#:  [id number]  DATE OF BIRTH:  1995-04-08  SUBJECTIVE:  CHIEF COMPLAINT:   Chief Complaint  Patient presents with  . Emesis  Feeling much better today.  Wants to eat REVIEW OF SYSTEMS:  Review of Systems  Constitutional: Positive for malaise/fatigue. Negative for diaphoresis, fever and weight loss.  HENT: Negative for ear discharge, ear pain, hearing loss, nosebleeds, sore throat and tinnitus.   Eyes: Negative for blurred vision and pain.  Respiratory: Negative for cough, hemoptysis, shortness of breath and wheezing.   Cardiovascular: Negative for chest pain, palpitations, orthopnea and leg swelling.  Gastrointestinal: Positive for nausea. Negative for blood in stool, constipation and heartburn.  Genitourinary: Positive for dysuria. Negative for frequency and urgency.  Musculoskeletal: Negative for back pain and myalgias.  Skin: Negative for itching and rash.  Neurological: Negative for dizziness, tingling, tremors, focal weakness, seizures, weakness and headaches.  Psychiatric/Behavioral: Negative for depression. The patient is not nervous/anxious.    DRUG ALLERGIES:   Allergies  Allergen Reactions  . Peanut Oil Other (See Comments)    Reaction to peanuts - gums feel numb and tingling  . Other Swelling    Pecans caused throat swelling and weakness   VITALS:  Blood pressure 118/70, pulse 100, temperature (!) 97.2 F (36.2 C), temperature source Oral, resp. rate (!) 23, height 5\' 8"  (1.727 m), weight 73.9 kg, last menstrual period 02/13/2020, SpO2 98 %. PHYSICAL EXAMINATION:  Physical Exam HENT:     Head: Normocephalic and atraumatic.  Eyes:     Conjunctiva/sclera: Conjunctivae normal.     Pupils: Pupils are equal, round, and reactive to light.  Neck:     Thyroid: No thyromegaly.     Trachea: No tracheal deviation.  Cardiovascular:     Rate and Rhythm: Normal rate and regular rhythm.     Heart  sounds: Normal heart sounds.  Pulmonary:     Effort: Pulmonary effort is normal. No respiratory distress.     Breath sounds: Normal breath sounds. No wheezing.  Chest:     Chest wall: No tenderness.  Abdominal:     General: Bowel sounds are normal. There is no distension.     Palpations: Abdomen is soft.     Tenderness: There is abdominal tenderness in the suprapubic area.  Musculoskeletal:        General: Normal range of motion.     Cervical back: Normal range of motion and neck supple.  Skin:    General: Skin is warm and dry.     Findings: No rash.  Neurological:     Mental Status: She is alert and oriented to person, place, and time.     Cranial Nerves: No cranial nerve deficit.    LABORATORY PANEL:  Female CBC Recent Labs  Lab 02/27/20 1135  WBC 21.1*  HGB 12.5  HCT 38.6  PLT 307   ------------------------------------------------------------------------------------------------------------------ Chemistries  Recent Labs  Lab 02/29/20 0616  NA 135  K 3.1*  CL 103  CO2 22  GLUCOSE 149*  BUN 11  CREATININE 0.60  CALCIUM 8.2*  MG 1.8   RADIOLOGY:  No results found. ASSESSMENT AND PLAN:  25 y.o. female with medical history significant of hyperlipidemia, type 1 diabetes, DKA, pyelonephritis, who presents with nausea, vomiting, diarrhea and lower abdominal pain.  DKA, type 1 (HCC): Present on admission and resolved now -Off insulin drip Start insulin Lantus 7 units subcu daily, NovoLog 2 units  subcu 3 times daily with meals and sliding scale insulin per diabetic nurse recommendation  Sepsis due to acute pyelonephritis: lactic acid 1.8.  Patient meets criteria for sepsis with leukocytosis, tachycardia with heart rate of 117, RR 26. -continue Rocephin Negative blood cultures.  Urine culture shows mixed organisms -continue IVF  Hypokalemia Replete and recheck, check magnesium  Nausea vomiting and diarrhea: -prn Phenergan. No vomiting/diarrhea while in the  hospital so far -IV fluid as above.  This can be discontinued when she has a good oral intake. Patient is not having any more diarrhea while in the hospital  Type 1 diabetes mellitus without complication (HCC): -Resume subcu insulin today  AKI (acute kidney injury) [facility]): Present on admission and now resolved -Avoid using renal toxic medications -IV fluid as above  Ruled out small bilateral hydrosalpinx: CT scan showed possible hydrosalpinx.  Consulted Dr. Logan Bores of OB/GYN, who recommended to get pelvis ultrasound, which showed no acute findings and ouestionable small bilateral hydrosalpinx.  Dr. Logan Bores reviewed ultrasound, and did not believe this is a part of her clinical picture at this time.  - follow-up with with Dr. Logan Bores in clinic. -GC/Chlamydia neg   Body mass index is 24.78 kg/m.  Can transfer to any MedSurg now DKA resolved  Status is: Inpatient  Remains inpatient appropriate because:Hemodynamically unstable and IV treatments appropriate due to intensity of illness or inability to take PO   Dispo: The patient is from: Home              Anticipated d/c is to: Home              Anticipated d/c date is: 1 day              Patient currently is not medically stable to d/c.   Difficult to place patient No   DVT prophylaxis:       enoxaparin (LOVENOX) injection 40 mg Start: 02/27/20 1215     Family Communication: ("discussed with patient")   All the records are reviewed and case discussed with Care Management/Social Worker. Management plans discussed with the patient, nursing and they are in agreement.  CODE STATUS: Full Code Level of care: Med-Surg  TOTAL TIME TAKING CARE OF THIS PATIENT: 35 minutes.   More than 50% of the time was spent in counseling/coordination of care: YES  POSSIBLE D/C IN 1-2 DAYS, DEPENDING ON CLINICAL CONDITION.   Delfino Lovett M.D on 02/29/2020 at 1:32 PM  [facility]   CC: Primary care physician; Patient, No Pcp Per  Note:  This dictation was prepared with Dragon dictation along with smaller phrase technology. Any transcriptional errors that result from this process are unintentional.

## 2020-02-29 NOTE — Progress Notes (Signed)
   02/28/20 1407  Assess: MEWS Score  Temp 97.7 F (36.5 C)  BP 117/73  Pulse Rate (!) 122  Resp 16  SpO2 99 %  O2 Device Room Air  Assess: MEWS Score  MEWS Temp 0  MEWS Systolic 0  MEWS Pulse 2  MEWS RR 0  MEWS LOC 0  MEWS Score 2  MEWS Score Color Yellow  Assess: if the MEWS score is Yellow or Red  Were vital signs taken at a resting state? Yes  Focused Assessment Change from prior assessment (see assessment flowsheet)  Early Detection of Sepsis Score *See Row Information* Low  MEWS guidelines implemented *See Row Information* Yes  Treat  MEWS Interventions Administered scheduled meds/treatments  Pain Scale 0-10  Pain Score 0  Take Vital Signs  Increase Vital Sign Frequency  Yellow: Q 2hr X 2 then Q 4hr X 2, if remains yellow, continue Q 4hrs  Escalate  MEWS: Escalate Yellow: discuss with charge nurse/RN and consider discussing with provider and RRT  Notify: Charge Nurse/RN  Name of Charge Nurse/RN Notified Patrice, RN  Date Charge Nurse/RN Notified 02/28/20  Time Charge Nurse/RN Notified 1407  Notify: Provider  Provider Name/Title Delfino Lovett, MD  Date Provider Notified 02/28/20  Time Provider Notified 1407  Notification Type Page  Notification Reason Change in status  Response See new orders (Gave metoprolol for HR)  Date of Provider Response 02/28/20  Time of Provider Response 1407  Document  Patient Outcome Stabilized after interventions  Progress note created (see row info) Yes  Inserted for Jake Bathe RN

## 2020-03-01 LAB — GLUCOSE, CAPILLARY
Glucose-Capillary: 234 mg/dL — ABNORMAL HIGH (ref 70–99)
Glucose-Capillary: 190 mg/dL — ABNORMAL HIGH (ref 70–99)
Glucose-Capillary: 270 mg/dL — ABNORMAL HIGH (ref 70–99)
Glucose-Capillary: 249 mg/dL — ABNORMAL HIGH (ref 70–99)

## 2020-03-01 LAB — CBC
HCT: 30 % — ABNORMAL LOW (ref 36.0–46.0)
Hemoglobin: 10.2 g/dL — ABNORMAL LOW (ref 12.0–15.0)
MCH: 30.6 pg (ref 26.0–34.0)
MCHC: 34 g/dL (ref 30.0–36.0)
MCV: 90.1 fL (ref 80.0–100.0)
Platelets: 294 10*3/uL (ref 150–400)
RBC: 3.33 MIL/uL — ABNORMAL LOW (ref 3.87–5.11)
RDW: 12.3 % (ref 11.5–15.5)
WBC: 12.1 10*3/uL — ABNORMAL HIGH (ref 4.0–10.5)
nRBC: 0 % (ref 0.0–0.2)

## 2020-03-01 LAB — BASIC METABOLIC PANEL
Anion gap: 9 (ref 5–15)
BUN: 8 mg/dL (ref 6–20)
CO2: 24 mmol/L (ref 22–32)
Calcium: 8.3 mg/dL — ABNORMAL LOW (ref 8.9–10.3)
Chloride: 100 mmol/L (ref 98–111)
Creatinine, Ser: 0.46 mg/dL (ref 0.44–1.00)
GFR, Estimated: >60 mL/min (ref >60)
Glucose, Bld: 281 mg/dL — ABNORMAL HIGH (ref 70–99)
Potassium: 3.2 mmol/L — ABNORMAL LOW (ref 3.5–5.1)
Sodium: 133 mmol/L — ABNORMAL LOW (ref 135–145)

## 2020-03-01 MED ORDER — INSULIN GLARGINE 100 UNIT/ML ~~LOC~~ SOLN
10.0000 [IU] | Freq: Every day | SUBCUTANEOUS | Status: DC
  Administered 2020-03-02: 10 [IU] via SUBCUTANEOUS
  Filled 2020-03-01: qty 0.1

## 2020-03-01 MED ORDER — POTASSIUM CHLORIDE 20 MEQ PO PACK
40.0000 meq | PACK | Freq: Once | ORAL | Status: AC
  Administered 2020-03-01: 40 meq via ORAL
  Filled 2020-03-01: qty 2

## 2020-03-01 NOTE — Progress Notes (Signed)
PROGRESS NOTE    Mandy Patel  ELF:[id number] DOB: [birth year] DOA: 02/27/2020 PCP: Patient, No Pcp Per   Brief Narrative:  This 25 years old female with PMH significant for hyperlipidemia, type 1 diabetes, DKA, pyelonephritis who presents in the emergency department with nausea, vomiting, diarrhea and lower abdominal pain.  She is found to have DKA and started on insulin drip.  Anion gap closed,  She was transitioned to subcu insulin.  Her sugars been improving.  She is also found to have sepsis secondary to acute pyelonephritis and on antibiotics. Sepsis physiology is improving.    Assessment & Plan:   Principal Problem:   DKA, type 1 ([place]) Active Problems:   Nausea vomiting and diarrhea   Acute pyelonephritis   Sepsis ([place])   Type 1 diabetes mellitus without complication ([place])   AKI (acute kidney injury) ([place])   DKA, type 1 ([place]): Present on admission and resolved now. She is found to have DKA.  Anion gap closed.  She is off insulin drip. Started insulin Lantus 7 units subcu daily, NovoLog 2 units subcu 3 times daily with meals and sliding scale insulin per diabetic nurse recommendation. Increase Lantus to 10 units subcu daily and NovoLog 4 units 3 times daily with meals.  Sepsis due to acute pyelonephritis: >>> Improving Lactic acid 1.8.Patient met criteria for sepsis with leukocytosis, tachycardia with heart rate of 117, RR 26. Continue Rocephin. Negative blood cultures.  Urine culture shows mixed organisms Continue IV fluids.  Hypokalemia. Replete and recheck, check magnesium  Nausea vomiting and diarrhea:>>> Improved. Continue Phenergan. No vomiting / diarrhea while in the hospital so far -IV fluid as above.  This can be discontinued when she has a good oral intake. Patient is not having any more diarrhea while in the hospital  Type 1 diabetes mellitus without complication ([place]): -Resumed subcu insulin on 1/28  AKI (acute kidney injury) [facility]): Present on  admission and now resolved -Avoid using renal toxic medications -IV fluid as above.  Ruled out small bilateral hydrosalpinx:CT scan showed possible hydrosalpinx. Consulted Dr. Amalia Hailey of OB/GYN, who recommended to get pelvis ultrasound,which showedno acute findingsand ouestionable small bilateral hydrosalpinx.Dr. Amalia Hailey reviewed ultrasound, and didnot believe this is a part of her clinical picture at this time.  - follow-up with with Dr. Amalia Hailey in clinic. -GC/Chlamydia neg   Body mass index is 24.78 kg/m.  DVT prophylaxis: Lovenox. Family Communication: No family at bedside Disposition Plan:    Status is: Inpatient  Remains inpatient appropriate because:Inpatient level of care appropriate due to severity of illness   Dispo: The patient is from: Home              Anticipated d/c is to: Home              Anticipated d/c date is: 1 day              Patient currently is not medically stable to d/c.   Difficult to place patient No  Consultants:    Diabatic coordinater  Procedures: None Antimicrobials: Anti-infectives (From admission, onward)   Start     Dose/Rate Route Frequency Ordered Stop   02/28/20 0900  cefTRIAXone (ROCEPHIN) 1 g in sodium chloride 0.9 % 100 mL IVPB  Status:  Discontinued        1 g 200 mL/hr over 30 Minutes Intravenous Every 24 hours 02/27/20 1156 02/27/20 1158   02/28/20 0900  cefTRIAXone (ROCEPHIN) 2 g in sodium chloride 0.9 % 100 mL IVPB  2 g 200 mL/hr over 30 Minutes Intravenous Every 24 hours 02/27/20 1158     02/27/20 1115  azithromycin (ZITHROMAX) 500 mg in sodium chloride 0.9 % 250 mL IVPB  Status:  Discontinued        500 mg 250 mL/hr over 60 Minutes Intravenous  Once 02/27/20 1103 02/27/20 1326   02/27/20 0930  cefTRIAXone (ROCEPHIN) 1 g in sodium chloride 0.9 % 100 mL IVPB        1 g 200 mL/hr over 30 Minutes Intravenous  Once 02/27/20 5056 02/27/20 1127     Subjective: Patient was seen and examined at bedside.  Overnight  events noted.  Patient reports feeling much better, still reports having mild intermittent flank pain.  Objective: Vitals:   02/29/20 2009 03/01/20 0002 03/01/20 0352 03/01/20 0825  BP: 120/72 105/71 117/78 112/75  Pulse: (!) 104 93 90 100  Resp: 18 16 18 18   Temp: 98.6 F (37 C) 98.4 F (36.9 C) 97.9 F (36.6 C) 98.9 F (37.2 C)  TempSrc: Oral Oral Oral   SpO2: 100% 100% 100% 100%  Weight:      Height:        Intake/Output Summary (Last 24 hours) at 03/01/2020 1239 Last data filed at 03/01/2020 1008 Gross per 24 hour  Intake 240 ml  Output --  Net 240 ml   Filed Weights   02/27/20 0746  Weight: 73.9 kg    Examination:  General exam: Appears calm and comfortable , not in any distress. Respiratory system: Clear to auscultation. Respiratory effort normal. Cardiovascular system: S1 & S2 heard, RRR. No JVD, murmurs, rubs, gallops or clicks. No pedal edema. Gastrointestinal system: Abdomen is nondistended, soft and nontender. No organomegaly or masses felt. Normal bowel sounds heard. Central nervous system: Alert and oriented. No focal neurological deficits. Extremities: Symmetric 5 x 5 power. Skin: No rashes, lesions or ulcers Psychiatry: Judgement and insight appear normal. Mood & affect appropriate.     Data Reviewed: I have personally reviewed following labs and imaging studies  CBC: Recent Labs  Lab 02/27/20 0750 02/27/20 1135 03/01/20 0451  WBC 21.3* 21.1* 12.1*  NEUTROABS  --  15.9*  --   HGB 12.4 12.5 10.2*  HCT 38.8 38.6 30.0*  MCV 96.5 96.7 90.1  PLT 303 307 979   Basic Metabolic Panel: Recent Labs  Lab 02/27/20 1550 02/27/20 1912 02/28/20 0059 02/28/20 0422 02/28/20 1122 02/29/20 0616 03/01/20 0451  NA 139   < > 142 140 138 135 133*  K 4.1   < > 3.9 4.1 3.8 3.1* 3.2*  CL 108   < > 108 106 104 103 100  CO2 11*   < > 20* 18* 16* 22 24  GLUCOSE 231*   < > 180* 337* 253* 149* 281*  BUN 18   < > 19 21* 21* 11 8  CREATININE 1.05*   < > 1.05*  1.00 0.98 0.60 0.46  CALCIUM 8.9   < > 9.1 9.0 8.6* 8.2* 8.3*  MG 2.1  --   --   --   --  1.8  --    < > = values in this interval not displayed.   GFR: Estimated Creatinine Clearance: 109.4 mL/min (by C-G formula based on SCr of 0.46 mg/dL). Liver Function Tests: No results for input(s): AST, ALT, ALKPHOS, BILITOT, PROT, ALBUMIN in the last 168 hours. No results for input(s): LIPASE, AMYLASE in the last 168 hours. No results for input(s): AMMONIA in the last 168 hours.  Coagulation Profile: Recent Labs  Lab 02/27/20 1209  INR 1.1   Cardiac Enzymes: No results for input(s): CKTOTAL, CKMB, CKMBINDEX, TROPONINI in the last 168 hours. BNP (last 3 results) No results for input(s): PROBNP in the last 8760 hours. HbA1C: No results for input(s): HGBA1C in the last 72 hours. CBG: Recent Labs  Lab 02/29/20 1116 02/29/20 1632 02/29/20 2116 03/01/20 0826 03/01/20 1143  GLUCAP 171* 190* 226* 234* 190*   Lipid Profile: No results for input(s): CHOL, HDL, LDLCALC, TRIG, CHOLHDL, LDLDIRECT in the last 72 hours. Thyroid Function Tests: No results for input(s): TSH, T4TOTAL, FREET4, T3FREE, THYROIDAB in the last 72 hours. Anemia Panel: No results for input(s): VITAMINB12, FOLATE, FERRITIN, TIBC, IRON, RETICCTPCT in the last 72 hours. Sepsis Labs: Recent Labs  Lab 02/27/20 0800 02/27/20 1209 02/28/20 0422 02/28/20 1503 02/29/20 0616  PROCALCITON 0.17  --  0.11  --  <0.10  LATICACIDVEN  --  1.8  --  1.5  --     Recent Results (from the past 240 hour(s))  SARS CORONAVIRUS 2 (TAT 6-24 HRS) Nasopharyngeal Nasopharyngeal Swab     Status: None   Collection Time: 02/27/20 11:30 AM   Specimen: Nasopharyngeal Swab  Result Value Ref Range Status   SARS Coronavirus 2 NEGATIVE NEGATIVE Final    Comment: (NOTE) SARS-CoV-2 target nucleic acids are NOT DETECTED.  The SARS-CoV-2 RNA is generally detectable in upper and lower respiratory specimens during the acute phase of infection.  Negative results do not preclude SARS-CoV-2 infection, do not rule out co-infections with other pathogens, and should not be used as the sole basis for treatment or other patient management decisions. Negative results must be combined with clinical observations, patient history, and epidemiological information. The expected result is Negative.  Fact Sheet for Patients: SugarRoll.be  Fact Sheet for Healthcare Providers: https://www.woods-mathews.com/  This test is not yet approved or cleared by the [place] FDA and  has been authorized for detection and/or diagnosis of SARS-CoV-2 by FDA under an Emergency Use Authorization (EUA). This EUA will remain  in effect (meaning this test can be used) for the duration of the COVID-19 declaration under Se ction 564(b)(1) of the Act, 21 U.S.C. section 360bbb-3(b)(1), unless the authorization is terminated or revoked sooner.  Performed at [facility], [place] [address], [place], [place] [address]   Culture, blood (x 2)     Status: None (Preliminary result)   Collection Time: 02/27/20 12:01 PM   Specimen: BLOOD  Result Value Ref Range Status   Specimen Description BLOOD  LEFT HAND  Final   Special Requests   Final    BOTTLES DRAWN AEROBIC AND ANAEROBIC Blood Culture adequate volume   Culture   Final    NO GROWTH 3 DAYS Performed at [facility], [address], [place], [place] [address]    Report Status PENDING  Incomplete  Urine culture     Status: Abnormal   Collection Time: 02/27/20  5:00 PM   Specimen: Urine, Random  Result Value Ref Range Status   Specimen Description   Final    URINE, RANDOM Performed at [facility], [address] [place], [place] [address]    Special Requests   Final    NONE Performed at [facility], [address] [place], [place] [address]    Culture MULTIPLE SPECIES PRESENT, SUGGEST RECOLLECTION (A)  Final   Report  Status 02/29/2020 FINAL  Final  Chlamydia/NGC rt PCR ([facility] only)  Status: None   Collection Time: 02/27/20  5:00 PM   Specimen: Urine  Result Value Ref Range Status   Specimen source GC/Chlam URINE, RANDOM  Final   Chlamydia Tr NOT DETECTED NOT DETECTED Final   N gonorrhoeae NOT DETECTED NOT DETECTED Final    Comment: (NOTE) This CT/NG assay has not been evaluated in patients with a history of  hysterectomy. Performed at [facility] [address] [place], [place] [address]   Culture, blood (Routine X 2) w Reflex to ID Panel     Status: None (Preliminary result)   Collection Time: 02/28/20  3:03 PM   Specimen: BLOOD  Result Value Ref Range Status   Specimen Description BLOOD BLOOD LEFT HAND  Final   Special Requests   Final    BOTTLES DRAWN AEROBIC AND ANAEROBIC Blood Culture adequate volume   Culture   Final    NO GROWTH 2 DAYS Performed at [facility], [address] [place], [place] [address]    Report Status PENDING  Incomplete  MRSA PCR Screening     Status: None   Collection Time: 02/28/20  6:36 PM   Specimen: Nasopharyngeal  Result Value Ref Range Status   MRSA by PCR NEGATIVE NEGATIVE Final    Comment:        The GeneXpert MRSA Assay (FDA approved for NASAL specimens only), is one component of a comprehensive MRSA colonization surveillance program. It is not intended to diagnose MRSA infection nor to guide or monitor treatment for MRSA infections. Performed at [facility], [address] [place], [place] [address]     Radiology Studies: No results found.  Scheduled Meds: . Chlorhexidine Gluconate Cloth  6 each Topical Daily  . enoxaparin (LOVENOX) injection  40 mg Subcutaneous Q24H  . insulin aspart  0-5 Units Subcutaneous QHS  . insulin aspart  0-9 Units Subcutaneous TID WC  . insulin aspart  2 Units Subcutaneous TID AC  . [START ON 03/02/2020] insulin glargine  10 Units Subcutaneous Daily  . metoprolol tartrate  25  mg Oral BID   Continuous Infusions: . cefTRIAXone (ROCEPHIN)  IV 2 g (03/01/20 0910)  . dextrose 5 % and 0.45% NaCl Stopped (02/29/20 0518)  . insulin Stopped (02/29/20 0518)     LOS: 3 days    Time spent: 35 mins.    Shawna Clamp, MD [facility]   If 7PM-7AM, please contact night-coverage

## 2020-03-01 NOTE — Progress Notes (Signed)
Inpatient Diabetes Program Recommendations  AACE/ADA: New Consensus Statement on Inpatient Glycemic Control (2015)  Target Ranges:  Prepandial:   less than 140 mg/dL      Peak postprandial:   less than 180 mg/dL (1-2 hours)      Critically ill patients:  140 - 180 mg/dL   Lab Results  Component Value Date   GLUCAP 190 (H) 03/01/2020   HGBA1C 13.0 (H) 02/27/2020    Review of Glycemic Control Results for LORANDA, MASTEL (MRN [id number]) as of 03/01/2020 11:55  Ref. Range 02/29/2020 16:32 02/29/2020 21:16 03/01/2020 08:26 03/01/2020 11:43  Glucose-Capillary Latest Ref Range: 70 - 99 mg/dL 754 (H) 492 (H) 010 (H) 190 (H)  Home DM Meds: Levemir 10 units QHS                             Humalog 4 units TID  Current Orders: Lantus 7 units Daily                            Novolog sensitive Correction Scale/ SSI (0-20 units) TID AC      Novolog 2 units tid with meals  Inpatient Diabetes Program Recommendations:    Consider increasing Lantus to 10 units daily.    Thanks,  Beryl Meager, RN, BC-ADM Inpatient Diabetes Coordinator Pager [phone] (8a-5p)

## 2020-03-02 LAB — COMPREHENSIVE METABOLIC PANEL
ALT: 8 U/L (ref 0–44)
AST: 9 U/L — ABNORMAL LOW (ref 15–41)
Albumin: 2.6 g/dL — ABNORMAL LOW (ref 3.5–5.0)
Alkaline Phosphatase: 64 U/L (ref 38–126)
Anion gap: 11 (ref 5–15)
BUN: 7 mg/dL (ref 6–20)
CO2: 24 mmol/L (ref 22–32)
Calcium: 8.4 mg/dL — ABNORMAL LOW (ref 8.9–10.3)
Chloride: 100 mmol/L (ref 98–111)
Creatinine, Ser: 0.49 mg/dL (ref 0.44–1.00)
GFR, Estimated: >60 mL/min (ref >60)
Glucose, Bld: 230 mg/dL — ABNORMAL HIGH (ref 70–99)
Potassium: 3.3 mmol/L — ABNORMAL LOW (ref 3.5–5.1)
Sodium: 135 mmol/L (ref 135–145)
Total Bilirubin: 0.3 mg/dL (ref 0.3–1.2)
Total Protein: 6.7 g/dL (ref 6.5–8.1)

## 2020-03-02 LAB — CBC
HCT: 26.4 % — ABNORMAL LOW (ref 36.0–46.0)
Hemoglobin: 9.2 g/dL — ABNORMAL LOW (ref 12.0–15.0)
MCH: 31.4 pg (ref 26.0–34.0)
MCHC: 34.8 g/dL (ref 30.0–36.0)
MCV: 90.1 fL (ref 80.0–100.0)
Platelets: 292 10*3/uL (ref 150–400)
RBC: 2.93 MIL/uL — ABNORMAL LOW (ref 3.87–5.11)
RDW: 12.3 % (ref 11.5–15.5)
WBC: 10.5 10*3/uL (ref 4.0–10.5)
nRBC: 0 % (ref 0.0–0.2)

## 2020-03-02 LAB — GLUCOSE, CAPILLARY: Glucose-Capillary: 214 mg/dL — ABNORMAL HIGH (ref 70–99)

## 2020-03-02 LAB — PHOSPHORUS: Phosphorus: 3.5 mg/dL (ref 2.5–4.6)

## 2020-03-02 LAB — MAGNESIUM: Magnesium: 2.2 mg/dL (ref 1.7–2.4)

## 2020-03-02 MED ORDER — INSULIN LISPRO 100 UNIT/ML ~~LOC~~ SOLN: 4.0000 [IU] | Freq: Three times a day (TID) | SUBCUTANEOUS | 2 refills | Status: DC

## 2020-03-02 MED ORDER — INSULIN DETEMIR 100 UNIT/ML ~~LOC~~ SOLN: 10.0000 [IU] | Freq: Every day | SUBCUTANEOUS | 2 refills | Status: DC

## 2020-03-02 MED ORDER — CIPROFLOXACIN HCL 500 MG PO TABS
500.0000 mg | ORAL_TABLET | Freq: Two times a day (BID) | ORAL | Status: DC
  Administered 2020-03-02: 500 mg via ORAL
  Filled 2020-03-02: qty 1

## 2020-03-02 MED ORDER — METOPROLOL TARTRATE 25 MG PO TABS: 25.0000 mg | ORAL_TABLET | Freq: Two times a day (BID) | ORAL | 0 refills | Status: DC

## 2020-03-02 MED ORDER — CIPROFLOXACIN HCL 500 MG PO TABS: 500.0000 mg | ORAL_TABLET | Freq: Two times a day (BID) | ORAL | 0 refills | Status: DC

## 2020-03-02 MED ORDER — POTASSIUM CHLORIDE 20 MEQ PO PACK
40.0000 meq | PACK | Freq: Once | ORAL | Status: AC
  Administered 2020-03-02: 40 meq via ORAL
  Filled 2020-03-02: qty 2

## 2020-03-02 NOTE — Discharge Summary (Signed)
Physician Discharge Summary  Mandy Patel DOB: [birth year] DOA: 02/27/2020  PCP: Patient, No Pcp Per  Admit date: 02/27/2020 .  Discharge date: 03/02/2020.   Admitted From: Home. Disposition: Home.  Recommendations for Outpatient Follow-up:  1. Follow up with PCP in 1-2 weeks. 2. Please obtain BMP/CBC in one week. 3. Advised to take Ciprofloxacin 500 mg twice daily for UTI to complete 10-day treatment for pyelonephritis.   4. Advised to take metoprolol 25 mg twice daily for tachycardia and hypertension. 5. We will request primary care physician to refer this patient to endocrinology for Diabetes management.  Home Health: None Equipment/Devices: None Discharge Condition: Stable CODE STATUS:Full code Diet recommendation: Heart Healthy   Brief Summary / Hospital Course: This 25 years old female with PMH significant for hyperlipidemia, type 1 diabetes, DKA and pyelonephritis who presents in the emergency department with nausea, vomiting, diarrhea and lower abdominal pain.  She is found to have DKA and was started on insulin drip.  She had high anion gap metabolic acidosis. Anion gap closed with IV fluids and IV insulin.,  She was transitioned to subcu insulin.  Her sugars been improving.  She was also found to have sepsis secondary to acute pyelonephritis and was started on antibiotics.  She was started on ceftriaxone, blood cultures no growth so far.  Urine cultures contaminated. She reports significant improvement in her urinary symptoms.  She remained afebrile, no leukocytosis.  Patient has been ambulating well, denies any abdominal pain, nausea, vomiting or burning urination.  Patient wants to be discharged home.  Patient is discharged on ciprofloxacin 500 mg twice daily for 5 more days to complete 10-day treatment for pyelonephritis.  Patient was started on metoprolol 25 mg twice daily for tachycardia and hypertension during hospitalization which has been improved.  She was  managed for below problems.    Discharge Diagnoses:  Principal Problem:   DKA, type 1 ([place]) Active Problems:   Nausea vomiting and diarrhea   Acute pyelonephritis   Sepsis ([place])   Type 1 diabetes mellitus without complication ([place])   AKI (acute kidney injury) ([place])  DKA, type 1 (H[facility]):Present on admission and resolved now. She is found to have DKA.  Anion gap closed.  She is off insulin drip. Started insulin Lantus 7 units subcu daily, NovoLog 2 units subcu 3 times daily with meals and sliding scale insulin per diabetic nurse recommendation. Increased Lantus to 10 units subcu daily and NovoLog 4 units 3 times daily with meals.  Sepsis due to acute pyelonephritis: >>> Improving Lactic acid 1.8.Patient met criteria for sepsis with leukocytosis, tachycardia with heart rate of 117, RR 26. Continue Rocephin. Negative blood cultures. Urine culture shows mixed organisms Continue IV fluids.  Hypokalemia>> Resolved. Replete and recheck, check magnesium  Nausea vomiting and diarrhea:>>> Improved. Continue Phenergan. No vomiting / diarrhea while in the hospital so far -IV fluid as above. This can be discontinued when she has a good oral intake. Patient is not having any more diarrhea while in the hospital  Type 1 diabetes mellitus without complication ([place]): -Resumed subcu insulin on 1/28  AKI (acute kidney injury) ([facility]):Present on admission and now resolved -Avoid using renal toxic medications -IV fluid as above.  Ruled outsmall bilateral hydrosalpinx:CT scan showed possible hydrosalpinx. Consulted Dr. Amalia Hailey of OB/GYN, who recommended to get pelvis ultrasound,which showedno acute findingsand ouestionable small bilateral hydrosalpinx.Dr. Amalia Hailey reviewed ultrasound, and didnot believe this is a part of her clinical picture at this time.  - follow-up with with Dr.  Evans in clinic. -GC/Chlamydia negative.    Discharge Instructions  Discharge Instructions     Call MD for:  difficulty breathing, headache or visual disturbances   Complete by: As directed    Call MD for:  persistant dizziness or light-headedness   Complete by: As directed    Call MD for:  persistant nausea and vomiting   Complete by: As directed    Diet - low sodium heart healthy   Complete by: As directed    Diet Carb Modified   Complete by: As directed    Discharge instructions   Complete by: As directed    Advised to follow-up with primary care physician in 1 week. Advised to take ciprofloxacin 500 mg twice daily for UTI to complete 10-day treatment for pyelonephritis.   Advised to take metoprolol 25 mg twice daily for tachycardia and hypertension.   Increase activity slowly   Complete by: As directed      Allergies as of 03/02/2020      Reactions   Peanut Oil Other (See Comments)   Reaction to peanuts - gums feel numb and tingling   Other Swelling   Pecans caused throat swelling and weakness      Medication List    TAKE these medications   blood glucose meter kit and supplies Kit Dispense based on patient and insurance preference. Use up to four times daily as directed. (FOR ICD-9 250.00, 250.01).   ciprofloxacin 500 MG tablet Commonly known as: CIPRO Take 1 tablet (500 mg total) by mouth every 12 (twelve) hours.   etonogestrel 68 MG Impl implant Commonly known as: NEXPLANON 1 each by Subdermal route once. **Implanted Sept 2016**   insulin detemir 100 UNIT/ML injection Commonly known as: LEVEMIR Inject 0.1 mLs (10 Units total) into the skin at bedtime.   insulin lispro 100 UNIT/ML injection Commonly known as: HumaLOG Inject 0.04 mLs (4 Units total) into the skin 3 (three) times daily with meals.   metoprolol tartrate 25 MG tablet Commonly known as: LOPRESSOR Take 1 tablet (25 mg total) by mouth 2 (two) times daily.       Follow-up Information    [facility] Follow up in 1 week(s).        [facility] .    Specialty: Internal Medicine Contact information: [place] [address] [phone]             Allergies  Allergen Reactions  . Peanut Oil Other (See Comments)    Reaction to peanuts - gums feel numb and tingling  . Other Swelling    Pecans caused throat swelling and weakness    Consultations:  None.   Procedures/Studies: DG Chest 2 View  Result Date: 02/27/2020 CLINICAL DATA:  DKA.  Vomiting. EXAM: CHEST - 2 VIEW COMPARISON:  Chest x-ray 09/04/2017. FINDINGS: Mediastinum hilar structures normal. Low lung volumes with mild basilar atelectasis. Very mild bibasilar infiltrates cannot be excluded. No pleural effusion or pneumothorax. Heart size normal. Mild scoliosis thoracic spine. IMPRESSION: Low lung volumes with mild basilar atelectasis. Very mild bibasilar infiltrates cannot be excluded. Electronically Signed   By: Marcello Moores  Register   On: 02/27/2020 09:41   CT ABDOMEN PELVIS W CONTRAST  Result Date: 02/27/2020 CLINICAL DATA:  Abdominal pain with vomiting and elevated white blood cell count EXAM: CT ABDOMEN AND PELVIS WITH CONTRAST TECHNIQUE: Multidetector CT imaging of the abdomen and pelvis was performed using the standard protocol following bolus administration of intravenous contrast. CONTRAST:  175m OMNIPAQUE IOHEXOL 300 MG/ML  SOLN COMPARISON:  11/01/2019 FINDINGS: Lower chest: No acute abnormality. Hepatobiliary: No focal liver abnormality is seen. No gallstones, gallbladder wall thickening, or biliary dilatation. Pancreas: Unremarkable. No pancreatic ductal dilatation or surrounding inflammatory changes. Spleen: Normal in size without focal abnormality. Adrenals/Urinary Tract: Adrenal glands are within normal limits. Kidneys again demonstrates some mild fullness of the collecting system particularly on the right although no obstructing lesion is seen. The bladder is well distended. Patchy enhancement of the kidneys is seen which may  represent changes of pyelonephritis. This is less prevalent and than that seen on the prior exam. Correlate with urinalysis. Stomach/Bowel: Colon demonstrates some fluid within the cecum although no obstructive changes are seen. The more distal colon is decompressed. No small bowel abnormality is noted. The appendix is well visualized and within normal limits. No inflammatory changes are seen. Small bowel and stomach are unremarkable. Vascular/Lymphatic: No significant vascular findings are present. No enlarged abdominal or pelvic lymph nodes. Reproductive: Uterus is within normal limits. There ovaries appear within normal limits. Dilated tubular structures are noted in a symmetrical fashion bilaterally suspicious for hydrosalpinx. Possibility of underlying infection deserves consideration. These changes have worsened in the interval from the prior exam. Other: No abdominal wall hernia or abnormality. No abdominopelvic ascites. Musculoskeletal: No acute or significant osseous findings. IMPRESSION: Dilated tubular structures in the pelvis bilaterally consistent with dilated fallopian tubes. This may simply represent hydrosalpinx although the possibility of underlying infection deserves consideration. Fullness of the collecting systems of the kidneys bilaterally with some mottled enhancement particularly in the upper lobe suspicious for pyelonephritis. Correlation with urinalysis is recommended. No other focal abnormality is seen. Electronically Signed   By: MInez CatalinaM.D.   On: 02/27/2020 10:42   [place]LVIC COMPLETE WITH TRANSVAGINAL  Result Date: 02/27/2020 CLINICAL DATA:  Hydrosalpinx EXAM: TRANSABDOMINAL AND TRANSVAGINAL ULTRASOUND OF PELVIS TECHNIQUE: Both transabdominal and transvaginal ultrasound examinations of the pelvis were performed. Transabdominal technique was performed for global imaging of the pelvis including uterus, ovaries, adnexal regions, and pelvic cul-de-sac. It was necessary to proceed  with endovaginal exam following the transabdominal exam to visualize the uterus, endometrium, ovaries and adnexa. COMPARISON:  03/15/2017 ultrasound.  CT 05/31/2018 FINDINGS: Uterus Measurements: 9.7 x 3.6 x 4.8 cm = volume: 86 mL. No fibroids or other mass visualized. Endometrium Thickness: 5 mm in thickness.  No focal abnormality visualized. Right ovary Measurements: 2.6 x 1.4 x 2.4 cm = volume: 4.4 mL. Normal appearance. Small tubular cystic area within the right adnexa adjacent to the ovary may reflect small hydrosalpinx. Left ovary Measurements: 4.4 x 1.7 x 2.0 cm = volume: 7.8 mL. Normal appearance. Small tubular cystic area within the adnexa adjacent to the ovary may reflect small hydrosalpinx. Other findings No abnormal free fluid. IMPRESSION: No acute findings. Questionable small bilateral hydrosalpinx. Electronically Signed   By: KRolm BaptiseM.D.   On: 02/27/2020 17:26       Subjective: Patient was seen and examined at bedside.  Overnight events noted.  Patient reports feeling much better,  denies any burning urination. She has ambulated in the hallway.  DKA has been resolved.  She is started on subcu Lantus and sliding scale regular insulin.  Sugars has been improving.  She has tolerated regular food.  She wants to be discharged.  Discharge Exam: Vitals:   03/02/20 0005 03/02/20 0724  BP: 111/67 138/86  Pulse: 90 (!) 105  Resp: 16 16  Temp: 98.2 F (36.8 C) 98.6 F (  37 C)  SpO2: 100% 100%   Vitals:   03/01/20 1433 03/01/20 1947 03/02/20 0005 03/02/20 0724  BP: 124/80 136/89 111/67 138/86  Pulse: (!) 101 (!) 110 90 (!) 105  Resp: 18 16 16 16   Temp: 98.9 F (37.2 C) 98.2 F (36.8 C) 98.2 F (36.8 C) 98.6 F (37 C)  TempSrc: Oral     SpO2: 100% 100% 100% 100%  Weight:      Height:        General: Pt is alert, awake, not in acute distress Cardiovascular: RRR, S1/S2 +, no rubs, no gallops Respiratory: CTA bilaterally, no wheezing, no rhonchi Abdominal: Soft, NT, ND,  bowel sounds + Extremities: no edema, no cyanosis    The results of significant diagnostics from this hospitalization (including imaging, microbiology, ancillary and laboratory) are listed below for reference.     Microbiology: Recent Results (from the past 240 hour(s))  SARS CORONAVIRUS 2 (TAT 6-24 HRS) Nasopharyngeal Nasopharyngeal Swab     Status: None   Collection Time: 02/27/20 11:30 AM   Specimen: Nasopharyngeal Swab  Result Value Ref Range Status   SARS Coronavirus 2 NEGATIVE NEGATIVE Final    Comment: (NOTE) SARS-CoV-2 target nucleic acids are NOT DETECTED.  The SARS-CoV-2 RNA is generally detectable in upper and lower respiratory specimens during the acute phase of infection. Negative results do not preclude SARS-CoV-2 infection, do not rule out co-infections with other pathogens, and should not be used as the sole basis for treatment or other patient management decisions. Negative results must be combined with clinical observations, patient history, and epidemiological information. The expected result is Negative.  Fact Sheet for Patients: SugarRoll.be  Fact Sheet for Healthcare Providers: https://www.woods-mathews.com/  This test is not yet approved or cleared by the [place] FDA and  has been authorized for detection and/or diagnosis of SARS-CoV-2 by FDA under an Emergency Use Authorization (EUA). This EUA will remain  in effect (meaning this test can be used) for the duration of the COVID-19 declaration under Se ction 564(b)(1) of the Act, 21 U.S.C. section 360bbb-3(b)(1), unless the authorization is terminated or revoked sooner.  Performed at [facility], [place] [address], [place], [place] [address]   Culture, blood (x 2)     Status: None (Preliminary result)   Collection Time: 02/27/20 12:01 PM   Specimen: BLOOD  Result Value Ref Range Status   Specimen Description BLOOD  LEFT HAND  Final   Special  Requests   Final    BOTTLES DRAWN AEROBIC AND ANAEROBIC Blood Culture adequate volume   Culture   Final    NO GROWTH 4 DAYS Performed at [facility], [address], [place], [place] [address]    Report Status PENDING  Incomplete  Urine culture     Status: Abnormal   Collection Time: 02/27/20  5:00 PM   Specimen: Urine, Random  Result Value Ref Range Status   Specimen Description   Final    URINE, RANDOM Performed at [facility], [address] [place], [place] [address]    Special Requests   Final    NONE Performed at [facility], [address] [place], [place] [address]    Culture MULTIPLE SPECIES PRESENT, SUGGEST RECOLLECTION (A)  Final   Report Status 02/29/2020 FINAL  Final  Chlamydia/NGC rt PCR ([facility] only)     Status: None   Collection Time: 02/27/20  5:00 PM   Specimen: Urine  Result Value Ref Range Status   Specimen  source GC/Chlam URINE, RANDOM  Final   Chlamydia Tr NOT DETECTED NOT DETECTED Final   N gonorrhoeae NOT DETECTED NOT DETECTED Final    Comment: (NOTE) This CT/NG assay has not been evaluated in patients with a history of  hysterectomy. Performed at [facility], [address] [place], [place] [address]   Culture, blood (Routine X 2) w Reflex to ID Panel     Status: None (Preliminary result)   Collection Time: 02/28/20  3:03 PM   Specimen: BLOOD  Result Value Ref Range Status   Specimen Description BLOOD BLOOD LEFT HAND  Final   Special Requests   Final    BOTTLES DRAWN AEROBIC AND ANAEROBIC Blood Culture adequate volume   Culture   Final    NO GROWTH 3 DAYS Performed at [facility], [address] [place], [place] [address]    Report Status PENDING  Incomplete  MRSA PCR Screening     Status: None   Collection Time: 02/28/20  6:36 PM   Specimen: Nasopharyngeal  Result Value Ref Range Status   MRSA by PCR NEGATIVE NEGATIVE Final    Comment:        The GeneXpert MRSA Assay (FDA approved  for NASAL specimens only), is one component of a comprehensive MRSA colonization surveillance program. It is not intended to diagnose MRSA infection nor to guide or monitor treatment for MRSA infections. Performed at [facility], [place] [place],  [address]      Labs: BNP (last 3 results) No results for input(s): BNP in the last 8760 hours. Basic Metabolic Panel: Recent Labs  Lab 02/27/20 1550 02/27/20 1912 02/28/20 0422 02/28/20 1122 02/29/20 0616 03/01/20 0451 03/02/20 0549  NA 139   < > 140 138 135 133* 135  K 4.1   < > 4.1 3.8 3.1* 3.2* 3.3*  CL 108   < > 106 104 103 100 100  CO2 11*   < > 18* 16* 22 24 24   GLUCOSE 231*   < > 337* 253* 149* 281* 230*  BUN 18   < > 21* 21* 11 8 7   CREATININE 1.05*   < > 1.00 0.98 0.60 0.46 0.49  CALCIUM 8.9   < > 9.0 8.6* 8.2* 8.3* 8.4*  MG 2.1  --   --   --  1.8  --  2.2  PHOS  --   --   --   --   --   --  3.5   < > = values in this interval not displayed.   Liver Function Tests: Recent Labs  Lab 03/02/20 0549  AST 9*  ALT 8  ALKPHOS 64  BILITOT 0.3  PROT 6.7  ALBUMIN 2.6*   No results for input(s): LIPASE, AMYLASE in the last 168 hours. No results for input(s): AMMONIA in the last 168 hours. CBC: Recent Labs  Lab 02/27/20 0750 02/27/20 1135 03/01/20 0451 03/02/20 0549  WBC 21.3* 21.1* 12.1* 10.5  NEUTROABS  --  15.9*  --   --   HGB 12.4 12.5 10.2* 9.2*  HCT 38.8 38.6 30.0* 26.4*  MCV 96.5 96.7 90.1 90.1  PLT 303 307 294 292   Cardiac Enzymes: No results for input(s): CKTOTAL, CKMB, CKMBINDEX, TROPONINI in the last 168 hours. BNP: Invalid input(s): POCBNP CBG: Recent Labs  Lab 03/01/20 0826 03/01/20 1143 03/01/20 1633 03/01/20 2050 03/02/20 0723  GLUCAP 234* 190* 270* 249* 214*   D-Dimer No results for input(s): DDIMER in the last  72 hours. Hgb A1c No results for input(s): HGBA1C in the last 72 hours. Lipid Profile No results for input(s): CHOL, HDL, LDLCALC, TRIG,  CHOLHDL, LDLDIRECT in the last 72 hours. Thyroid function studies No results for input(s): TSH, T4TOTAL, T3FREE, THYROIDAB in the last 72 hours.  Invalid input(s): FREET3 Anemia work up No results for input(s): VITAMINB12, FOLATE, FERRITIN, TIBC, IRON, RETICCTPCT in the last 72 hours. Urinalysis    Component Value Date/Time   COLORURINE YELLOW (A) 02/27/2020 1700   APPEARANCEUR HAZY (A) 02/27/2020 1700   LABSPEC 1.021 02/27/2020 1700   PHURINE 5.0 02/27/2020 1700   GLUCOSEU >=500 (A) 02/27/2020 1700   GLUCOSEU >=1000 12/01/2010 1051   HGBUR LARGE (A) 02/27/2020 1700   BILIRUBINUR NEGATIVE 02/27/2020 1700   KETONESUR 80 (A) 02/27/2020 1700   PROTEINUR 100 (A) 02/27/2020 1700   UROBILINOGEN 0.2 12/01/2010 1051   NITRITE NEGATIVE 02/27/2020 1700   LEUKOCYTESUR SMALL (A) 02/27/2020 1700   Sepsis Labs Invalid input(s): PROCALCITONIN,  WBC,  LACTICIDVEN Microbiology Recent Results (from the past 240 hour(s))  SARS CORONAVIRUS 2 (TAT 6-24 HRS) Nasopharyngeal Nasopharyngeal Swab     Status: None   Collection Time: 02/27/20 11:30 AM   Specimen: Nasopharyngeal Swab  Result Value Ref Range Status   SARS Coronavirus 2 NEGATIVE NEGATIVE Final    Comment: (NOTE) SARS-CoV-2 target nucleic acids are NOT DETECTED.  The SARS-CoV-2 RNA is generally detectable in upper and lower respiratory specimens during the acute phase of infection. Negative results do not preclude SARS-CoV-2 infection, do not rule out co-infections with other pathogens, and should not be used as the sole basis for treatment or other patient management decisions. Negative results must be combined with clinical observations, patient history, and epidemiological information. The expected result is Negative.  Fact Sheet for Patients: SugarRoll.be  Fact Sheet for Healthcare Providers: https://www.woods-mathews.com/  This test is not yet approved or cleared by the [place]  FDA and  has been authorized for detection and/or diagnosis of SARS-CoV-2 by FDA under an Emergency Use Authorization (EUA). This EUA will remain  in effect (meaning this test can be used) for the duration of the COVID-19 declaration under Se ction 564(b)(1) of the Act, 21 U.S.C. section 360bbb-3(b)(1), unless the authorization is terminated or revoked sooner.  Performed at [facility], [place] [address] [place], [place] [address]   Culture, blood (x 2)     Status: None (Preliminary result)   Collection Time: 02/27/20 12:01 PM   Specimen: BLOOD  Result Value Ref Range Status   Specimen Description BLOOD  LEFT HAND  Final   Special Requests   Final    BOTTLES DRAWN AEROBIC AND ANAEROBIC Blood Culture adequate volume   Culture   Final    NO GROWTH 4 DAYS Performed at [facility], [address] [place], [place] [address]    Report Status PENDING  Incomplete  Urine culture     Status: Abnormal   Collection Time: 02/27/20  5:00 PM   Specimen: Urine, Random  Result Value Ref Range Status   Specimen Description   Final    URINE, RANDOM Performed at [facility], [address], [place], [place] [address]    Special Requests   Final    NONE Performed at [facility], [address] [place], [place] [address]    Culture MULTIPLE SPECIES PRESENT, SUGGEST RECOLLECTION (A)  Final   Report Status 02/29/2020 FINAL  Final  Chlamydia/NGC rt PCR ([facility] only)  Status: None   Collection Time: 02/27/20  5:00 PM   Specimen: Urine  Result Value Ref Range Status   Specimen source GC/Chlam URINE, RANDOM  Final   Chlamydia Tr NOT DETECTED NOT DETECTED Final   N gonorrhoeae NOT DETECTED NOT DETECTED Final    Comment: (NOTE) This CT/NG assay has not been evaluated in patients with a history of  hysterectomy. Performed at [facility], [address] [place], [place] [address]   Culture, blood (Routine X 2) w Reflex to ID Panel     Status:  None (Preliminary result)   Collection Time: 02/28/20  3:03 PM   Specimen: BLOOD  Result Value Ref Range Status   Specimen Description BLOOD BLOOD LEFT HAND  Final   Special Requests   Final    BOTTLES DRAWN AEROBIC AND ANAEROBIC Blood Culture adequate volume   Culture   Final    NO GROWTH 3 DAYS Performed at [facility], [address] [place], [place] [address]    Report Status PENDING  Incomplete  MRSA PCR Screening     Status: None   Collection Time: 02/28/20  6:36 PM   Specimen: Nasopharyngeal  Result Value Ref Range Status   MRSA by PCR NEGATIVE NEGATIVE Final    Comment:        The GeneXpert MRSA Assay (FDA approved for NASAL specimens only), is one component of a comprehensive MRSA colonization surveillance program. It is not intended to diagnose MRSA infection nor to guide or monitor treatment for MRSA infections. Performed at [facility], [address] [place], [place] [address]      Time coordinating discharge: Over 30 minutes  SIGNED:   Shawna Clamp, MD  [facility] 03/02/2020, 9:57 AM Pager   If 7PM-7AM, please contact night-coverage [email or web address]

## 2020-03-02 NOTE — Plan of Care (Signed)

## 2020-03-02 NOTE — Discharge Instructions (Signed)
Advised to follow-up with primary care physician in 1 week. Advised to take ciprofloxacin 500 mg twice daily for UTI to complete 10-day treatment for pyelonephritis.   Advised to take metoprolol 25 mg twice daily for tachycardia and hypertension.

## 2020-03-02 NOTE — Progress Notes (Signed)
Patient discharging home. Instructions given to patient, verbalized understanding. Family to transport patient home.  

## 2020-03-03 LAB — CULTURE, BLOOD (ROUTINE X 2)
Culture: NO GROWTH
Special Requests: ADEQUATE

## 2020-03-04 LAB — CULTURE, BLOOD (ROUTINE X 2)
Culture: NO GROWTH
Special Requests: ADEQUATE

## 2020-03-25 ENCOUNTER — Ambulatory Visit: Payer: Self-pay | Admitting: Obstetrics and Gynecology

## 2020-03-25 NOTE — Progress Notes (Deleted)
PCP:  Patient, No Pcp Per   No chief complaint on file.    HPI:      [name] is a 25 y.o. G0P0000 whose LMP was No LMP recorded., presents today for her annual examination.  Her menses are {norm/abn:715}, lasting {number:22536} days.  Dysmenorrhea {dysmen:716}. She {does:18564} have intermenstrual bleeding.  Sex activity: {sex active:315163}. Nexplanon removed 12/21 Last Pap: {QMGN:003704888}  Results were: {norm/abn:16707::"no abnormalities"} /neg HPV DNA *** Hx of STDs: {STD hx:14358}  There is no FH of breast cancer. There is no FH of ovarian cancer. The patient {does:18564} do self-breast exams.  Tobacco use: {tob:20664} Alcohol use: {Alcohol:11675} No drug use.  Exercise: {exercise:31265}  She {does:18564} get adequate calcium and Vitamin D in her diet.   The pregnancy intention screening data noted above was reviewed. Potential methods of contraception were discussed. The patient elected to proceed with {Upstream End Methods:24109}.     Past Medical History:  Diagnosis Date  . DKA (diabetic ketoacidosis) ([place])   . Pyelonephritis 03/2017  . Type 1 diabetes [facility])     diagnosed age 36yo    Past Surgical History:  Procedure Laterality Date  . EYE MUSCLE SURGERY  approx 2010   bilat eye surgury, left exotropia worse;Dr Annamaria Boots, opthomology    Family History  Problem Relation Age of Onset  . Alcohol abuse Other   . Arthritis Other   . Hyperlipidemia Other   . Stroke Other   . Diabetes Other   . Hyperlipidemia Other   . Hypertension Other   . Diabetes Other     Social History   Socioeconomic History  . Marital status: Single    Spouse name: Not on file  . Number of children: Not on file  . Years of education: Not on file  . Highest education level: Not on file  Occupational History  . Occupation: Lexicographer: UNEMPLOYED  Tobacco Use  . Smoking status: Never Smoker  . Smokeless tobacco: Never Used  Vaping Use  . Vaping Use:  Never used  Substance and Sexual Activity  . Alcohol use: No  . Drug use: Yes    Types: Marijuana  . Sexual activity: Yes    Birth control/protection: Implant  Other Topics Concern  . Not on file  Social History Narrative  . Not on file   Social Determinants of Health   Financial Resource Strain: Not on file  Food Insecurity: Not on file  Transportation Needs: Not on file  Physical Activity: Not on file  Stress: Not on file  Social Connections: Not on file  Intimate Partner Violence: Not on file     Current Outpatient Medications:  .  blood glucose meter kit and supplies KIT, Dispense based on patient and insurance preference. Use up to four times daily as directed. (FOR ICD-9 250.00, 250.01)., Disp: 1 each, Rfl: 0 .  ciprofloxacin (CIPRO) 500 MG tablet, Take 1 tablet (500 mg total) by mouth every 12 (twelve) hours., Disp: 10 tablet, Rfl: 0 .  etonogestrel (NEXPLANON) 68 MG IMPL implant, 1 each by Subdermal route once. **Implanted Sept 2016**, Disp: , Rfl:  .  insulin detemir (LEVEMIR) 100 UNIT/ML injection, Inject 0.1 mLs (10 Units total) into the skin at bedtime., Disp: 3 mL, Rfl: 2 .  insulin lispro (HUMALOG) 100 UNIT/ML injection, Inject 0.04 mLs (4 Units total) into the skin 3 (three) times daily with meals., Disp: 3.6 mL, Rfl: 2 .  metoprolol tartrate (LOPRESSOR) 25 MG tablet, Take  1 tablet (25 mg total) by mouth 2 (two) times daily., Disp: 60 tablet, Rfl: 0     ROS:  Review of Systems BREAST: No symptoms   Objective: There were no vitals taken for this visit.   OBGyn Exam  Results: No results found for this or any previous visit (from the past 24 hour(s)).  Assessment/Plan: No diagnosis found.  No orders of the defined types were placed in this encounter.            GYN counsel {counseling:16159}     F/U  No follow-ups on file.  Latricia Cerrito B. Zadkiel Dragan, PA-C 03/25/2020 9:14 AM

## 2021-06-22 ENCOUNTER — Encounter: Payer: Self-pay | Admitting: Emergency Medicine

## 2021-06-22 DIAGNOSIS — Z79899 Other long term (current) drug therapy: Secondary | ICD-10-CM

## 2021-06-22 DIAGNOSIS — N390 Urinary tract infection, site not specified: Secondary | ICD-10-CM | POA: Diagnosis not present

## 2021-06-22 DIAGNOSIS — I160 Hypertensive urgency: Secondary | ICD-10-CM | POA: Diagnosis present

## 2021-06-22 DIAGNOSIS — Z91018 Allergy to other foods: Secondary | ICD-10-CM

## 2021-06-22 DIAGNOSIS — K529 Noninfective gastroenteritis and colitis, unspecified: Secondary | ICD-10-CM | POA: Diagnosis not present

## 2021-06-22 DIAGNOSIS — Z91010 Allergy to peanuts: Secondary | ICD-10-CM

## 2021-06-22 DIAGNOSIS — E109 Type 1 diabetes mellitus without complications: Secondary | ICD-10-CM | POA: Diagnosis not present

## 2021-06-22 DIAGNOSIS — R000 Tachycardia, unspecified: Secondary | ICD-10-CM | POA: Diagnosis not present

## 2021-06-22 DIAGNOSIS — E1065 Type 1 diabetes mellitus with hyperglycemia: Secondary | ICD-10-CM | POA: Diagnosis present

## 2021-06-22 DIAGNOSIS — A4150 Gram-negative sepsis, unspecified: Secondary | ICD-10-CM | POA: Diagnosis not present

## 2021-06-22 DIAGNOSIS — Z794 Long term (current) use of insulin: Secondary | ICD-10-CM

## 2021-06-22 DIAGNOSIS — A419 Sepsis, unspecified organism: Secondary | ICD-10-CM | POA: Diagnosis not present

## 2021-06-22 DIAGNOSIS — Z20822 Contact with and (suspected) exposure to covid-19: Secondary | ICD-10-CM | POA: Diagnosis present

## 2021-06-22 LAB — CBC WITH DIFFERENTIAL/PLATELET
Abs Immature Granulocytes: 0.06 10*3/uL (ref 0.00–0.07)
Basophils Absolute: 0.1 10*3/uL (ref 0.0–0.1)
Basophils Relative: 1 %
Eosinophils Absolute: 0.6 10*3/uL — ABNORMAL HIGH (ref 0.0–0.5)
Eosinophils Relative: 5 %
HCT: 34 % — ABNORMAL LOW (ref 36.0–46.0)
Hemoglobin: 11.3 g/dL — ABNORMAL LOW (ref 12.0–15.0)
Immature Granulocytes: 1 %
Lymphocytes Relative: 22 %
Lymphs Abs: 2.9 10*3/uL (ref 0.7–4.0)
MCH: 30.8 pg (ref 26.0–34.0)
MCHC: 33.2 g/dL (ref 30.0–36.0)
MCV: 92.6 fL (ref 80.0–100.0)
Monocytes Absolute: 0.7 10*3/uL (ref 0.1–1.0)
Monocytes Relative: 6 %
Neutro Abs: 8.6 10*3/uL — ABNORMAL HIGH (ref 1.7–7.7)
Neutrophils Relative %: 65 %
Platelets: 315 10*3/uL (ref 150–400)
RBC: 3.67 MIL/uL — ABNORMAL LOW (ref 3.87–5.11)
RDW: 11.9 % (ref 11.5–15.5)
WBC: 13 10*3/uL — ABNORMAL HIGH (ref 4.0–10.5)
nRBC: 0 % (ref 0.0–0.2)

## 2021-06-22 LAB — URINALYSIS, ROUTINE W REFLEX MICROSCOPIC
Bilirubin Urine: NEGATIVE
Glucose, UA: >=500 mg/dL — AB
Hgb urine dipstick: NEGATIVE
Ketones, ur: NEGATIVE mg/dL
Nitrite: NEGATIVE
Protein, ur: >=300 mg/dL — AB
Specific Gravity, Urine: 1.021 (ref 1.005–1.030)
WBC, UA: >50 WBC/hpf — ABNORMAL HIGH (ref 0–5)
pH: 5 (ref 5.0–8.0)

## 2021-06-22 LAB — COMPREHENSIVE METABOLIC PANEL
ALT: 15 U/L (ref 0–44)
AST: 13 U/L — ABNORMAL LOW (ref 15–41)
Albumin: 2.7 g/dL — ABNORMAL LOW (ref 3.5–5.0)
Alkaline Phosphatase: 81 U/L (ref 38–126)
Anion gap: 8 (ref 5–15)
BUN: 17 mg/dL (ref 6–20)
CO2: 26 mmol/L (ref 22–32)
Calcium: 8.9 mg/dL (ref 8.9–10.3)
Chloride: 104 mmol/L (ref 98–111)
Creatinine, Ser: 0.81 mg/dL (ref 0.44–1.00)
GFR, Estimated: >60 mL/min (ref >60)
Glucose, Bld: 228 mg/dL — ABNORMAL HIGH (ref 70–99)
Potassium: 4.4 mmol/L (ref 3.5–5.1)
Sodium: 138 mmol/L (ref 135–145)
Total Bilirubin: 0.6 mg/dL (ref 0.3–1.2)
Total Protein: 6.6 g/dL (ref 6.5–8.1)

## 2021-06-22 LAB — RESP PANEL BY RT-PCR (FLU A&B, COVID) ARPGX2
Influenza A by PCR: NEGATIVE
Influenza B by PCR: NEGATIVE
SARS Coronavirus 2 by RT PCR: NEGATIVE

## 2021-06-22 LAB — POC URINE PREG, ED: Preg Test, Ur: NEGATIVE

## 2021-06-22 LAB — CBG MONITORING, ED: Glucose-Capillary: 237 mg/dL — ABNORMAL HIGH (ref 70–99)

## 2021-06-22 LAB — LIPASE, BLOOD: Lipase: 23 U/L (ref 11–51)

## 2021-06-22 MED ORDER — ONDANSETRON 4 MG PO TBDP
4.0000 mg | ORAL_TABLET | Freq: Once | ORAL | Status: AC
  Administered 2021-06-22: 4 mg via ORAL
  Filled 2021-06-22: qty 1

## 2021-06-22 NOTE — ED Provider Triage Note (Signed)
Emergency Medicine Provider Triage Evaluation Note  Mandy Patel , a 26 y.o. female  was evaluated in triage.  Pt complains of nausea, emesis, diarrhea.  Patient states that symptoms began today.  No fever, URI symptoms, abdominal pain.  Still has gallbladder and appendix..  Review of Systems  Positive: Nausea, vomiting, diarrhea Negative: Fever, URI symptoms, chest pain, shortness of breath, dysuria, polyuria, hematuria.  Physical Exam  Ht 5\' 8"  (1.727 m)   Wt 74.8 kg   LMP 05/29/2021 (Exact Date)   BMI 25.09 kg/m  Gen:   Awake, no distress   Resp:  Normal effort  MSK:   Moves extremities without difficulty  Other:  Bowel sounds present.  No tenderness on exam.  Medical Decision Making  Medically screening exam initiated at 7:18 PM.  Appropriate orders placed.  Mandy Patel was informed that the remainder of the evaluation will be completed by another provider, this initial triage assessment does not replace that evaluation, and the importance of remaining in the ED until their evaluation is complete.  Patient presents with nausea, vomiting, diarrhea starting today.  No fever reported.  No abdominal pain.  Patient will have labs, urinalysis at this time.   Freida Busman, PA-C 06/22/21 1918

## 2021-06-22 NOTE — ED Triage Notes (Signed)
Pt presents via POV with complaints of N/V with associated diarrhea starting around midnight last night. She describes her emesis as brown in color - denies hematochezia.  Pt is a T1DM and states her CBGs have been all over in ranges but have been her "normal". Denies abdominal pain, constipation or urinary sx.

## 2021-06-22 NOTE — ED Notes (Signed)
CBG 237 

## 2021-06-23 ENCOUNTER — Emergency Department: Payer: BC Managed Care – PPO

## 2021-06-23 ENCOUNTER — Encounter: Payer: Self-pay | Admitting: Family Medicine

## 2021-06-23 ENCOUNTER — Other Ambulatory Visit: Payer: Self-pay

## 2021-06-23 ENCOUNTER — Inpatient Hospital Stay
Admission: EM | Admit: 2021-06-23 | Discharge: 2021-06-25 | DRG: 872 | Disposition: A | Discharge status: Home / Self Care | Payer: BC Managed Care – PPO | Attending: Internal Medicine | Admitting: Internal Medicine

## 2021-06-23 DIAGNOSIS — E1065 Type 1 diabetes mellitus with hyperglycemia: Secondary | ICD-10-CM | POA: Diagnosis present

## 2021-06-23 DIAGNOSIS — Z794 Long term (current) use of insulin: Secondary | ICD-10-CM | POA: Diagnosis not present

## 2021-06-23 DIAGNOSIS — E109 Type 1 diabetes mellitus without complications: Secondary | ICD-10-CM | POA: Diagnosis present

## 2021-06-23 DIAGNOSIS — R112 Nausea with vomiting, unspecified: Secondary | ICD-10-CM

## 2021-06-23 DIAGNOSIS — Z79899 Other long term (current) drug therapy: Secondary | ICD-10-CM | POA: Diagnosis not present

## 2021-06-23 DIAGNOSIS — N390 Urinary tract infection, site not specified: Secondary | ICD-10-CM | POA: Diagnosis present

## 2021-06-23 DIAGNOSIS — I160 Hypertensive urgency: Secondary | ICD-10-CM

## 2021-06-23 DIAGNOSIS — A4150 Gram-negative sepsis, unspecified: Secondary | ICD-10-CM | POA: Diagnosis present

## 2021-06-23 DIAGNOSIS — K529 Noninfective gastroenteritis and colitis, unspecified: Secondary | ICD-10-CM

## 2021-06-23 DIAGNOSIS — A419 Sepsis, unspecified organism: Principal | ICD-10-CM

## 2021-06-23 DIAGNOSIS — Z20822 Contact with and (suspected) exposure to covid-19: Secondary | ICD-10-CM | POA: Diagnosis present

## 2021-06-23 DIAGNOSIS — Z91010 Allergy to peanuts: Secondary | ICD-10-CM | POA: Diagnosis not present

## 2021-06-23 DIAGNOSIS — Z91018 Allergy to other foods: Secondary | ICD-10-CM | POA: Diagnosis not present

## 2021-06-23 LAB — GLUCOSE, CAPILLARY
Glucose-Capillary: 93 mg/dL (ref 70–99)
Glucose-Capillary: 152 mg/dL — ABNORMAL HIGH (ref 70–99)

## 2021-06-23 LAB — LACTIC ACID, PLASMA
Lactic Acid, Venous: 1.1 mmol/L (ref 0.5–1.9)
Lactic Acid, Venous: 0.9 mmol/L (ref 0.5–1.9)

## 2021-06-23 LAB — CBG MONITORING, ED
Glucose-Capillary: 351 mg/dL — ABNORMAL HIGH (ref 70–99)
Glucose-Capillary: 178 mg/dL — ABNORMAL HIGH (ref 70–99)

## 2021-06-23 LAB — HIV ANTIBODY (ROUTINE TESTING W REFLEX): HIV Screen 4th Generation wRfx: NONREACTIVE

## 2021-06-23 LAB — HEMOGLOBIN A1C
Hgb A1c MFr Bld: 12.4 % — ABNORMAL HIGH (ref 4.8–5.6)
Mean Plasma Glucose: 309.18 mg/dL

## 2021-06-23 MED ORDER — ENOXAPARIN SODIUM 40 MG/0.4ML IJ SOSY
40.0000 mg | PREFILLED_SYRINGE | INTRAMUSCULAR | Status: DC
  Administered 2021-06-23 – 2021-06-25 (×3): 40 mg via SUBCUTANEOUS
  Filled 2021-06-23 (×3): qty 0.4

## 2021-06-23 MED ORDER — SODIUM CHLORIDE 0.9 % IV SOLN: 1.0000 g | Freq: Once | INTRAVENOUS | Status: DC

## 2021-06-23 MED ORDER — ONDANSETRON HCL 4 MG PO TABS: 4.0000 mg | ORAL_TABLET | Freq: Four times a day (QID) | ORAL | Status: DC | PRN

## 2021-06-23 MED ORDER — TRAZODONE HCL 50 MG PO TABS
25.0000 mg | ORAL_TABLET | Freq: Every evening | ORAL | Status: DC | PRN
  Administered 2021-06-23 – 2021-06-24 (×2): 25 mg via ORAL
  Filled 2021-06-23 (×2): qty 1

## 2021-06-23 MED ORDER — LABETALOL HCL 5 MG/ML IV SOLN
20.0000 mg | INTRAVENOUS | Status: DC | PRN
Start: 2021-06-23 — End: 2021-06-25

## 2021-06-23 MED ORDER — SODIUM CHLORIDE 0.9 % IV SOLN
1.0000 g | INTRAVENOUS | Status: DC
  Administered 2021-06-23: 1 g via INTRAVENOUS
  Filled 2021-06-23: qty 10

## 2021-06-23 MED ORDER — ONDANSETRON HCL 4 MG/2ML IJ SOLN: 4.0000 mg | Freq: Four times a day (QID) | INTRAMUSCULAR | Status: DC | PRN

## 2021-06-23 MED ORDER — ACETAMINOPHEN 325 MG PO TABS: 650.0000 mg | ORAL_TABLET | Freq: Four times a day (QID) | ORAL | Status: DC | PRN

## 2021-06-23 MED ORDER — SODIUM CHLORIDE 0.9 % IV SOLN
2.0000 g | INTRAVENOUS | Status: DC
  Administered 2021-06-24 – 2021-06-25 (×2): 2 g via INTRAVENOUS
  Filled 2021-06-23 (×2): qty 20

## 2021-06-23 MED ORDER — ONDANSETRON HCL 4 MG/2ML IJ SOLN
4.0000 mg | INTRAMUSCULAR | Status: AC
  Administered 2021-06-23: 4 mg via INTRAVENOUS
  Filled 2021-06-23: qty 2

## 2021-06-23 MED ORDER — SODIUM CHLORIDE 0.9 % IV SOLN
500.0000 mg | INTRAVENOUS | Status: DC
  Administered 2021-06-23: 500 mg via INTRAVENOUS
  Filled 2021-06-23: qty 5

## 2021-06-23 MED ORDER — LOSARTAN POTASSIUM 25 MG PO TABS
25.0000 mg | ORAL_TABLET | Freq: Every day | ORAL | Status: DC
  Administered 2021-06-24 – 2021-06-25 (×2): 25 mg via ORAL
  Filled 2021-06-23 (×2): qty 1

## 2021-06-23 MED ORDER — LOPERAMIDE HCL 2 MG PO CAPS: 2.0000 mg | ORAL_CAPSULE | ORAL | Status: DC | PRN

## 2021-06-23 MED ORDER — SODIUM CHLORIDE 0.9 % IV SOLN
INTRAVENOUS | Status: DC
Start: 2021-06-23 — End: 2021-06-24

## 2021-06-23 MED ORDER — SODIUM CHLORIDE 0.9 % IV SOLN
1.0000 g | Freq: Once | INTRAVENOUS | Status: AC
  Administered 2021-06-23: 1 g via INTRAVENOUS
  Filled 2021-06-23 (×2): qty 10

## 2021-06-23 MED ORDER — METOPROLOL TARTRATE 25 MG PO TABS
25.0000 mg | ORAL_TABLET | Freq: Two times a day (BID) | ORAL | Status: DC
  Administered 2021-06-23 – 2021-06-25 (×5): 25 mg via ORAL
  Filled 2021-06-23 (×5): qty 1

## 2021-06-23 MED ORDER — METOCLOPRAMIDE HCL 5 MG/ML IJ SOLN
5.0000 mg | Freq: Four times a day (QID) | INTRAMUSCULAR | Status: DC
  Administered 2021-06-23 – 2021-06-25 (×9): 5 mg via INTRAVENOUS
  Filled 2021-06-23 (×9): qty 2

## 2021-06-23 MED ORDER — INSULIN ASPART 100 UNIT/ML IJ SOLN: 4.0000 [IU] | Freq: Three times a day (TID) | INTRAMUSCULAR | Status: DC

## 2021-06-23 MED ORDER — LACTATED RINGERS IV BOLUS (SEPSIS)
1000.0000 mL | Freq: Once | INTRAVENOUS | Status: AC
  Administered 2021-06-23: 1000 mL via INTRAVENOUS

## 2021-06-23 MED ORDER — LACTATED RINGERS IV BOLUS
1000.0000 mL | Freq: Once | INTRAVENOUS | Status: AC
  Administered 2021-06-23: 1000 mL via INTRAVENOUS

## 2021-06-23 MED ORDER — MAGNESIUM HYDROXIDE 400 MG/5ML PO SUSP: 30.0000 mL | Freq: Every day | ORAL | Status: DC | PRN

## 2021-06-23 MED ORDER — INSULIN ASPART 100 UNIT/ML IJ SOLN
4.0000 [IU] | Freq: Three times a day (TID) | INTRAMUSCULAR | Status: DC
  Administered 2021-06-23 – 2021-06-24 (×3): 4 [IU] via SUBCUTANEOUS
  Filled 2021-06-23 (×4): qty 1

## 2021-06-23 MED ORDER — LACTATED RINGERS IV BOLUS (SEPSIS): 250.0000 mL | Freq: Once | INTRAVENOUS | Status: DC

## 2021-06-23 MED ORDER — INSULIN DETEMIR 100 UNIT/ML ~~LOC~~ SOLN
10.0000 [IU] | Freq: Every day | SUBCUTANEOUS | Status: DC
  Administered 2021-06-23 – 2021-06-24 (×2): 10 [IU] via SUBCUTANEOUS
  Filled 2021-06-23 (×2): qty 0.1

## 2021-06-23 MED ORDER — INSULIN ASPART 100 UNIT/ML IJ SOLN: 0.0000 [IU] | Freq: Every day | INTRAMUSCULAR | Status: DC

## 2021-06-23 MED ORDER — ACETAMINOPHEN 650 MG RE SUPP: 650.0000 mg | Freq: Four times a day (QID) | RECTAL | Status: DC | PRN

## 2021-06-23 MED ORDER — SODIUM CHLORIDE 0.9 % IV SOLN: INTRAVENOUS | Status: DC

## 2021-06-23 MED ORDER — INSULIN ASPART 100 UNIT/ML IJ SOLN: 0.0000 [IU] | Freq: Three times a day (TID) | INTRAMUSCULAR | Status: DC

## 2021-06-23 MED ORDER — INSULIN ASPART 100 UNIT/ML IJ SOLN
0.0000 [IU] | Freq: Three times a day (TID) | INTRAMUSCULAR | Status: DC
  Administered 2021-06-23: 3 [IU] via SUBCUTANEOUS
  Administered 2021-06-23: 15 [IU] via SUBCUTANEOUS
  Administered 2021-06-24: 3 [IU] via SUBCUTANEOUS
  Administered 2021-06-25: 2 [IU] via SUBCUTANEOUS
  Filled 2021-06-23 (×4): qty 1

## 2021-06-23 NOTE — ED Notes (Signed)
Informed Rn bed assigned  

## 2021-06-23 NOTE — ED Provider Notes (Signed)
[facility] Provider Note    Event Date/Time   First MD Initiated Contact with Patient 06/23/21 [phone]     (approximate)   History   Emesis   HPI  Mandy Patel is a 26 y.o. female whose medical history includes type 1 diabetes.  She presents for about 24 hours of gradually worsening nausea, vomiting, and diarrhea.  She has not had any abdominal pain, mostly just the nausea.  She is concerned because of her diabetes and how bad she has been feeling as result of the nausea and vomiting.  She has had no dysuria or increased urinary frequency.  No fever of which she is aware.  No shortness of breath nor chest pain.     Physical Exam   Triage Vital Signs: ED Triage Vitals  Enc Vitals Group     BP 06/22/21 1919 (!) 150/104     Pulse Rate 06/22/21 1919 (!) 120     Resp 06/22/21 1919 20     Temp 06/22/21 1919 99.5 F (37.5 C)     Temp src --      SpO2 06/22/21 1919 98 %     Weight 06/22/21 1916 74.8 kg (165 lb)     Height 06/22/21 1916 1.727 m (5\' 8" )     Head Circumference --      Peak Flow --      Pain Score 06/22/21 1916 0     Pain Loc --      Pain Edu? --      Excl. in GC? --     Most recent vital signs: Vitals:   06/23/21 0545 06/23/21 0610  BP: (!) 149/94 (!) 142/85  Pulse: (!) 119 (!) 123  Resp: 19 20  Temp:    SpO2: 100% 100%     General: Awake, no distress.  Appears uncomfortable due to nausea but nontoxic appearance. CV:  Good peripheral perfusion.  Tachycardia, regular rhythm. Resp:  Normal effort.  Lungs are clear to auscultation bilaterally. Abd:  No distention.  No tenderness to palpation.  No rebound or guarding.   ED Results / Procedures / Treatments   Labs (all labs ordered are listed, but only abnormal results are displayed) Labs Reviewed  COMPREHENSIVE METABOLIC PANEL - Abnormal; Notable for the following components:      Result Value   Glucose, Bld 228 (*)    Albumin 2.7 (*)    AST 13 (*)    All other  components within normal limits  CBC WITH DIFFERENTIAL/PLATELET - Abnormal; Notable for the following components:   WBC 13.0 (*)    RBC 3.67 (*)    Hemoglobin 11.3 (*)    HCT 34.0 (*)    Neutro Abs 8.6 (*)    Eosinophils Absolute 0.6 (*)    All other components within normal limits  URINALYSIS, ROUTINE W REFLEX MICROSCOPIC - Abnormal; Notable for the following components:   Color, Urine AMBER (*)    APPearance CLOUDY (*)    Glucose, UA >=500 (*)    Protein, ur >=300 (*)    Leukocytes,Ua TRACE (*)    WBC, UA >50 (*)    Bacteria, UA MANY (*)    All other components within normal limits  CBG MONITORING, ED - Abnormal; Notable for the following components:   Glucose-Capillary 237 (*)    All other components within normal limits  RESP PANEL BY RT-PCR (FLU A&B, COVID) ARPGX2  URINE CULTURE  CULTURE, BLOOD (ROUTINE X 2)  CULTURE,  BLOOD (ROUTINE X 2)  LIPASE, BLOOD  LACTIC ACID, PLASMA  LACTIC ACID, PLASMA  HIV ANTIBODY (ROUTINE TESTING W REFLEX)  POC URINE PREG, ED     EKG  ED ECG REPORT I, Loleta Rose, the attending physician, personally viewed and interpreted this ECG.  Date: 06/23/2021 EKG Time: 4:37 AM Rate: 121 Rhythm: Sinus tachycardia QRS Axis: normal Intervals: normal ST/T Wave abnormalities: normal Narrative Interpretation: no evidence of acute ischemia    RADIOLOGY As described in hospital course, chest x-ray is normal and interpreted by myself.    PROCEDURES:  Critical Care performed: Yes, see critical care procedure note(s)  .1-3 Lead EKG Interpretation Performed by: Loleta Rose, MD Authorized by: Loleta Rose, MD     Interpretation: abnormal     ECG rate:  125   ECG rate assessment: tachycardic     Rhythm: sinus tachycardia     Ectopy: none     Conduction: normal   .Critical Care Performed by: Loleta Rose, MD Authorized by: Loleta Rose, MD   Critical care provider statement:    Critical care time (minutes):  45   Critical care  time was exclusive of:  Separately billable procedures and treating other patients   Critical care was necessary to treat or prevent imminent or life-threatening deterioration of the following conditions:  Sepsis   Critical care was time spent personally by me on the following activities:  Development of treatment plan with patient or surrogate, evaluation of patient's response to treatment, examination of patient, obtaining history from patient or surrogate, ordering and performing treatments and interventions, ordering and review of laboratory studies, ordering and review of radiographic studies, pulse oximetry, re-evaluation of patient's condition and review of old charts   MEDICATIONS ORDERED IN ED: Medications  lactated ringers bolus 250 mL (250 mLs Intravenous Not Given 06/23/21 0616)  azithromycin (ZITHROMAX) 500 mg in sodium chloride 0.9 % 250 mL IVPB (500 mg Intravenous New Bag/Given 06/23/21 0527)  metoCLOPramide (REGLAN) injection 5 mg (5 mg Intravenous Given 06/23/21 0518)  metoprolol tartrate (LOPRESSOR) tablet 25 mg (has no administration in time range)  insulin detemir (LEVEMIR) injection 10 Units (has no administration in time range)  enoxaparin (LOVENOX) injection 40 mg (has no administration in time range)  cefTRIAXone (ROCEPHIN) 2 g in sodium chloride 0.9 % 100 mL IVPB (has no administration in time range)  0.9 %  sodium chloride infusion ( Intravenous New Bag/Given 06/23/21 0606)  acetaminophen (TYLENOL) tablet 650 mg (has no administration in time range)    Or  acetaminophen (TYLENOL) suppository 650 mg (has no administration in time range)  traZODone (DESYREL) tablet 25 mg (has no administration in time range)  magnesium hydroxide (MILK OF MAGNESIA) suspension 30 mL (has no administration in time range)  ondansetron (ZOFRAN) tablet 4 mg (has no administration in time range)    Or  ondansetron (ZOFRAN) injection 4 mg (has no administration in time range)  labetalol (NORMODYNE)  injection 20 mg (has no administration in time range)  loperamide (IMODIUM) capsule 2 mg (has no administration in time range)  ondansetron (ZOFRAN-ODT) disintegrating tablet 4 mg (4 mg Oral Given 06/22/21 1926)  lactated ringers bolus 1,000 mL (0 mLs Intravenous Stopped 06/23/21 0325)  cefTRIAXone (ROCEPHIN) 1 g in sodium chloride 0.9 % 100 mL IVPB (0 g Intravenous Stopped 06/23/21 0203)  ondansetron (ZOFRAN) injection 4 mg (4 mg Intravenous Given 06/23/21 0204)  lactated ringers bolus 1,000 mL (0 mLs Intravenous Stopped 06/23/21 0431)     IMPRESSION / MDM /  ASSESSMENT AND PLAN / ED COURSE  I reviewed the triage vital signs and the nursing notes.                              Differential diagnosis includes, but is not limited to, DKA, nonspecific viral illness, UTI/pyelonephritis.  Patient's presentation is most consistent with acute presentation with potential threat to life or bodily function.  Labs ordered include CMP, lipase, CBC with differential, urinalysis, respiratory viral panel, urine pregnancy test.  Labs are notable for positive urinalysis with greater than 50 WBCs and many bacteria.  Urine pregnancy test is negative.  Leukocytosis of 13.  Patient has no pain or tenderness which is reassuring.  However I am worried about her type 1 diabetes with superimposed urinary tract infection.  I will treat with LR 1 L IV bolus, ceftriaxone 1 g IV, Zofran 4 mg IV, and we will reassess.  I also added on a lactic acid.  Unclear at this point whether she will require admission or whether she will be able to be treated as an outpatient.  The patient is on the cardiac monitor to evaluate for evidence of arrhythmia and/or significant heart rate changes.  Clinical Course as of 06/23/21 8786  Tue Jun 23, 2021  0228 Lactic Acid, Venous: 1.1 Lactic acid is within normal limits [CF]  0241 Patient says she feels better, no longer nauseated, and I will try a p.o. challenge.  Her liter of fluids is  almost gone.  She is still mildly tachycardic at about 110.  I will reassess again after the p.o. challenge and completion of the fluids.  She has received her ceftriaxone 1 g IV. [CF]  [phone] Patient still tachycardic at about 125, but she told her nurse that she thinks her heart rate is always high.  She has tolerated a little bit of oral intake but did not drink much.  I will send a repeat lactic acid since she is still tachycardic and I ordered a second liter of LR.  I will then reassess if her vital signs have improved and if she needs admission. [CF]  0417 Lactic Acid, Venous: 0.9 Lactic acid remains within normal limits at 0.9 on second check [CF]  0435 After 2 L of fluid, patient is still tachycardic in the 120s, still nauseated and unwilling or unable to drink much and only fluids.  Given the concern that she will become more acutely ill particularly given the type 1 diabetes as a complicating factor, I think she would benefit from admission and treatment for sepsis presumably due to urinary tract infection.  I am consulting the hospitalist for admission. [CF]  0505 Discussed case by phone with Dr. Arville Care with the hospitalist service, and he will admit [CF]  [phone] I viewed and interpreted the patient's chest x-ray and there is no evidence of pneumonia. [CF]    Clinical Course User Index [CF] Loleta Rose, MD     FINAL CLINICAL IMPRESSION(S) / ED DIAGNOSES   Final diagnoses:  Sepsis, due to unspecified organism, unspecified whether acute organ dysfunction present (HCC)  Intractable nausea and vomiting  Type 1 diabetes mellitus without complication (HCC)     Rx / DC Orders   ED Discharge Orders     None        Note:  This document was prepared using Dragon voice recognition software and may include unintentional dictation errors.   Loleta Rose, MD 06/23/21 [phone]

## 2021-06-23 NOTE — Progress Notes (Signed)
Admission profile updated. ?

## 2021-06-23 NOTE — ED Notes (Signed)
Patient tolerated sips of ginger ale without nausea or vomiting. Patient encouraged to drink more fluids. No distress noted at this time.

## 2021-06-23 NOTE — Assessment & Plan Note (Signed)
-   The patient will be hydrated with IV normal saline. - We will place on supplement coverage with NovoLog and continue basal coverage.

## 2021-06-23 NOTE — Progress Notes (Signed)
No charge progress note.  Mandy Patel is a 26 y.o. female with medical history significant for DKA, type 1 diabetes mellitus and pyelonephritis, who presented to the ER with acute onset of intractable nausea and vomiting that started around 5 AM yesterday morning, with mild diarrhea.  No fever or chills.  She was also having some increased urinary frequency along with some polydipsia.  Blood sugar elevated and ranging between 200-300 most of the time.  Apparently she ran out of her Levemir and was only using NovoLog.  On presentation to ED she met sepsis criteria with leukocytosis, tachycardia tachypnea and tachycardia.  UA with some leukocytes and bacteria, markedly elevated glucose urea and protein urea.  Initial blood cultures remain negative.  Urine cultures pending.. Chest x-ray was negative for any acute cardiopulmonary disease.  Patient was given ceftriaxone and Zithromax in ED.  Patient was feeling little improved when seen during morning rounds.  Continues to have some nausea but able to tolerate few bites.  Poor appetite. No upper respiratory symptoms so Zithromax was discontinued. We will continue with ceftriaxone and follow-up urine cultures.  Blood pressure remained mildly elevated, patient was only on Lopressor at home.  Significant proteinuria.  Started her on losartan 25 mg daily.  There was also concern of acute gastroenteritis, most likely viral.  Symptoms improving and no abdominal pain. We will continue with supportive care.  Blood glucose level remained elevated.  A1c pending. She was restarted on home Levemir-will need a new prescription on discharge. 4 units of NovoLog added with meal along with SSI.

## 2021-06-23 NOTE — Progress Notes (Addendum)
Inpatient Diabetes Program Recommendations  AACE/ADA: New Consensus Statement on Inpatient Glycemic Control (2015)  Target Ranges:  Prepandial:   less than 140 mg/dL      Peak postprandial:   less than 180 mg/dL (1-2 hours)      Critically ill patients:  140 - 180 mg/dL    Latest Reference Range & Units 06/22/21 19:21  Glucose-Capillary 70 - 99 mg/dL 237 (H)  (H): Data is abnormally high   Admit with:  Sepsis due to gram-negative UTI  Hypertensive urgency Acute gastroenteritis  History: Type 1 Diabetes  Home DM Meds: Levemir 10 units QHS (per MAR, not taking due to issues with insurance coverage?)        Humalog 4 units TID with meals  Current Orders: Levemir 10 units QHS    MD- Note Levemir not scheduled to start until tonight at bedtime.  No orders for Novolog.  Please consider:  1. Start Levemir 10 units Daily this AM  2. Start Novolog Sensitive Correction Scale/ SSI (0-9 units) TID AC + HS  MD- Pt told Diabetes Coordinator she has not taken any Levemir for 1 year now--Has been managing CBGs with Humalog alone--I am unsure how pt has avoided DKA for this long given she has Type 1 diabetes??    Please renew Rx for Levemir when she is discharged--has insurance now  Please also give pt Rx for new CBG meter for home: [id number]   Addendum 11:30am--Met w/ pt at bedside down in the ED.  Pt told me she lost her insurance about 1 year ago and could not afford to see an MD nor purchase her Levemir insulin.  Has been managing her CBGs with Humalog alone for the last year.  I am unsure how pt has avoided DKA for this long given she has Type 1 diabetes??  Discussed with pt the importance of basal insulin to prevent DKA and why she needs basal/bolus given her lack of endogenous insulin production.  Pt now has insurance coverage through her work.  She is trying to establish care with Dr. Cathlean Cower with [facility] and told me she also plans to seek care under an ENDO.   We reviewed her  last A1c of 13% back in Jan 2022--Discussed with pt current A1c is pending and expect it may be high given her lack of Levemir insulin for the past year. Reminded patient that her goal A1c is 7% or less per ADA standards to prevent both acute and long-term complications.  Explained to patient the extreme importance of good glucose control at home.  Encouraged patient to check her CBGs at least TID AC at home and to record all CBGs in a logbook for her PCP or Endocrinologist to review.  Pt requested new CBG meter Rx.  Have asked MD to supply pt with CBG meter Rx at time of d/c home.  Pt very appreciative of all info and did not have any questions for me at this time.    --Will follow patient during hospitalization--  Wyn Quaker RN, MSN, CDE Diabetes Coordinator Inpatient Glycemic Control Team Team Pager: [phone] (8a-5p)

## 2021-06-23 NOTE — Assessment & Plan Note (Signed)
-   This is likely viral. - She will be hydrated with IV normal saline. - As needed antiemetics and antidiarrheals will be given.

## 2021-06-23 NOTE — H&P (Addendum)
[place]   PATIENT NAME: Mandy Patel    MR#:  [id number]  DATE OF BIRTH:  1995/11/10  DATE OF ADMISSION:  06/23/2021  PRIMARY CARE PHYSICIAN: Patient, No Pcp Per (Inactive)   Patient is coming from: Home  REQUESTING/REFERRING PHYSICIAN: Les Pou, MD  CHIEF COMPLAINT:   Chief Complaint  Patient presents with   Emesis    HISTORY OF PRESENT ILLNESS:  Mandy Patel is a 26 y.o. female with medical history significant for DKA, type 1 diabetes mellitus and pyelonephritis, who presented to the ER with acute onset of intractable nausea and vomiting that started around 5 AM yesterday morning, with mild diarrhea.  Bowel movement.  She denies any bilious vomitus or hematemesis.  No melena or bright red bleeding per rectum.  She denies any abdominal pain or dysuria.  She has been having urinary frequency and admits to polydipsia.  Her blood glucose measures been ranging from 200-300.  No fever or chills.  No chest pain or dyspnea.  No cough or wheezing.  She was tachycardic but denied any palpitations.  ED Course: When she came to the ER BP was 150/104 with heart rate of 120 and temperature 99.5 and later 98.2 with pulse currently 98% on room air.  Labs revealed a glucose of 228, albumin of 2.7 and otherwise unremarkable CMP.  CBC showed leukocytosis 13 with neutrophilia and anemia better than previous levels.  Influenza antigens and COVID-19 PCR came back negative.  UA was positive for UTI and showed more than 500 glucose and more than 300 protein with trace nitrite.    EKG as reviewed by me : EKG showed sinus tachycardia with rate 121 with borderline right axis deviation and borderline prolonged QT interval with QTc of 479 MS. Imaging: Portable chest ray showed no acute cardiopulmonary disease.  The patient was given a gram of IV Rocephin.  She will be admitted to a medical telemetry bed for further evaluation and management. PAST MEDICAL HISTORY:   Past Medical History:   Diagnosis Date   DKA (diabetic ketoacidosis) ([place])    Pyelonephritis 03/2017   Type 1 diabetes ([place])     diagnosed age 35yo    PAST SURGICAL HISTORY:   Past Surgical History:  Procedure Laterality Date   EYE MUSCLE SURGERY  approx 2010   bilat eye surgury, left exotropia worse;Dr Annamaria Boots, opthomology    SOCIAL HISTORY:   Social History   Tobacco Use   Smoking status: Never   Smokeless tobacco: Never  Substance Use Topics   Alcohol use: No    FAMILY HISTORY:   Family History  Problem Relation Age of Onset   Alcohol abuse Other    Arthritis Other    Hyperlipidemia Other    Stroke Other    Diabetes Other    Hyperlipidemia Other    Hypertension Other    Diabetes Other     DRUG ALLERGIES:   Allergies  Allergen Reactions   Peanut Oil Other (See Comments)    Reaction to peanuts - gums feel numb and tingling   Other Swelling    Pecans caused throat swelling and weakness    REVIEW OF SYSTEMS:   ROS As per history of present illness. All pertinent systems were reviewed above. Constitutional, HEENT, cardiovascular, respiratory, GI, GU, musculoskeletal, neuro, psychiatric, endocrine, integumentary and hematologic systems were reviewed and are otherwise negative/unremarkable except for positive findings mentioned above in the HPI.   MEDICATIONS AT HOME:   Prior to Admission  medications   Medication Sig Start Date End Date Taking? Authorizing Provider  blood glucose meter kit and supplies KIT Dispense based on patient and insurance preference. Use up to four times daily as directed. (FOR ICD-9 250.00, 250.01). 06/01/18   Bettey Costa, MD  ciprofloxacin (CIPRO) 500 MG tablet Take 1 tablet (500 mg total) by mouth every 12 (twelve) hours. 03/02/20   Shawna Clamp, MD  etonogestrel (NEXPLANON) 68 MG IMPL implant 1 each by Subdermal route once. **Implanted Sept 2016**    [provider]  insulin detemir (LEVEMIR) 100 UNIT/ML injection Inject 0.1 mLs (10 Units total)  into the skin at bedtime. 03/02/20 05/31/20  Shawna Clamp, MD  insulin lispro (HUMALOG) 100 UNIT/ML injection Inject 0.04 mLs (4 Units total) into the skin 3 (three) times daily with meals. 03/02/20 05/31/20  Shawna Clamp, MD  metoprolol tartrate (LOPRESSOR) 25 MG tablet Take 1 tablet (25 mg total) by mouth 2 (two) times daily. 03/02/20   Shawna Clamp, MD      VITAL SIGNS:  Blood pressure (!) 155/95, pulse (!) 123, temperature 98.2 F (36.8 C), temperature source Oral, resp. rate (!) 36, height _0  (1.727 m), weight 74.8 kg, last menstrual period 05/29/2021, SpO2 100 %.  PHYSICAL EXAMINATION:  Physical Exam  GENERAL:  26 y.o.-year-old female patient lying in the bed with no acute distress.  EYES: Pupils equal, round, reactive to light and accommodation. No scleral icterus. Extraocular muscles intact.  HEENT: Head atraumatic, normocephalic. Oropharynx and nasopharynx clear.  NECK:  Supple, no jugular venous distention. No thyroid enlargement, no tenderness.  LUNGS: Normal breath sounds bilaterally, no wheezing, rales,rhonchi or crepitation. No use of accessory muscles of respiration.  CARDIOVASCULAR: Regular rate and rhythm, S1, S2 normal. No murmurs, rubs, or gallops.  ABDOMEN: Soft, nondistended, nontender. Bowel sounds present. No organomegaly or mass.  EXTREMITIES: No pedal edema, cyanosis, or clubbing.  NEUROLOGIC: Cranial nerves II through XII are intact. Muscle strength 5/5 in all extremities. Sensation intact. Gait not checked.  PSYCHIATRIC: The patient is alert and oriented x 3.  Normal affect and good eye contact. SKIN: No obvious rash, lesion, or ulcer.   LABORATORY PANEL:   CBC Recent Labs  Lab 06/22/21 1919  WBC 13.0*  HGB 11.3*  HCT 34.0*  PLT 315   ------------------------------------------------------------------------------------------------------------------  Chemistries  Recent Labs  Lab 06/22/21 1919  NA 138  K 4.4  CL 104  CO2 26  GLUCOSE 228*   BUN 17  CREATININE 0.81  CALCIUM 8.9  AST 13*  ALT 15  ALKPHOS 81  BILITOT 0.6   ------------------------------------------------------------------------------------------------------------------  Cardiac Enzymes No results for input(s): TROPONINI in the last 168 hours. ------------------------------------------------------------------------------------------------------------------  RADIOLOGY:  DG Chest Port 1 View  Result Date: 06/23/2021 CLINICAL DATA:  Sepsis. EXAM: PORTABLE CHEST 1 VIEW COMPARISON:  02/27/2020 FINDINGS: 0456 hours. The lungs are clear without focal pneumonia, edema, pneumothorax or pleural effusion. The cardiopericardial silhouette is within normal limits for size. The visualized bony structures of the thorax are unremarkable. Telemetry leads overlie the chest. IMPRESSION: No active disease. Electronically Signed   By: Misty Stanley M.D.   On: 06/23/2021 05:33      IMPRESSION AND PLAN:  Assessment and Plan: * Sepsis due to gram-negative UTI ([place]) - Sepsis is manifested by leukocytosis, tachycardia and tachypnea. - She will be admitted to a medical telemetry bed. - We will continue antibiotic therapy with IV Rocephin. - We will follow urine and blood cultures. - She will be hydrated with IV normal  saline.  Hypertensive urgency - We will continue her Lopressor and place her on as needed IV labetalol.  Uncontrolled type 1 diabetes mellitus with hyperglycemia, with long-term current use of insulin ([place]) - The patient will be hydrated with IV normal saline. - We will place on supplement coverage with NovoLog and continue basal coverage.  Acute gastroenteritis - This is likely viral. - She will be hydrated with IV normal saline. - As needed antiemetics and antidiarrheals will be given.   DVT prophylaxis: Lovenox.  Advanced Care Planning:  Code Status: full code.  Family Communication:  The plan of care was discussed in details with the patient (and  family). I answered all questions. The patient agreed to proceed with the above mentioned plan. Further management will depend upon hospital course. Disposition Plan: Back to previous home environment Consults called: none.  All the records are reviewed and case discussed with ED provider.  Status is: Inpatient  At the time of the admission, it appears that the appropriate admission status for this patient is inpatient.  This is judged to be reasonable and necessary in order to provide the required intensity of service to ensure the patient's safety given the presenting symptoms, physical exam findings and initial radiographic and laboratory data in the context of comorbid conditions.  The patient requires inpatient status due to high intensity of service, high risk of further deterioration and high frequency of surveillance required.  I certify that at the time of admission, it is my clinical judgment that the patient will require inpatient hospital care extending more than 2 midnights.                            Dispo: The patient is from: Home              Anticipated d/c is to: Home              Patient currently is not medically stable to d/c.              Difficult to place patient: No  Christel Mormon M.D on 06/23/2021 at 5:49 AM  [facility]   From 7 PM-7 AM, contact night-coverage [email or web address]  CC: Primary care physician; Patient, No Pcp Per (Inactive)

## 2021-06-23 NOTE — ED Notes (Signed)
Dr. Karma Greaser notified that second liter LR completed with HR sustained sinus tachycardia 117-120's. Patient reports continued nausea.

## 2021-06-23 NOTE — Progress Notes (Signed)
CODE SEPSIS - PHARMACY COMMUNICATION  **Broad Spectrum Antibiotics should be administered within 1 hour of Sepsis diagnosis**  Time Code Sepsis Called/Page Received: 5/23 @ 0437   Antibiotics Ordered: Ceftriaxone , azithromycin   Time of 1st antibiotic administration: Ceftriaxone 1 gm IV @ 0151  Additional action taken by pharmacy:   If necessary, Name of Provider/Nurse Contacted:     Brittnye Josephs D ,PharmD Clinical Pharmacist  06/23/2021  4:43 AM

## 2021-06-23 NOTE — Assessment & Plan Note (Signed)
-   Sepsis is manifested by leukocytosis, tachycardia and tachypnea. - She will be admitted to a medical telemetry bed. - We will continue antibiotic therapy with IV Rocephin. - We will follow urine and blood cultures. - She will be hydrated with IV normal saline.

## 2021-06-23 NOTE — Assessment & Plan Note (Addendum)
-   We will continue her Lopressor and place her on as needed IV labetalol.

## 2021-06-23 NOTE — Sepsis Progress Note (Signed)
Following for sepsis monitoring ?

## 2021-06-24 DIAGNOSIS — N390 Urinary tract infection, site not specified: Secondary | ICD-10-CM | POA: Diagnosis not present

## 2021-06-24 DIAGNOSIS — A4150 Gram-negative sepsis, unspecified: Secondary | ICD-10-CM | POA: Diagnosis not present

## 2021-06-24 LAB — CBC
HCT: 27.3 % — ABNORMAL LOW (ref 36.0–46.0)
Hemoglobin: 9.1 g/dL — ABNORMAL LOW (ref 12.0–15.0)
MCH: 31.1 pg (ref 26.0–34.0)
MCHC: 33.3 g/dL (ref 30.0–36.0)
MCV: 93.2 fL (ref 80.0–100.0)
Platelets: 255 10*3/uL (ref 150–400)
RBC: 2.93 MIL/uL — ABNORMAL LOW (ref 3.87–5.11)
RDW: 11.9 % (ref 11.5–15.5)
WBC: 11.9 10*3/uL — ABNORMAL HIGH (ref 4.0–10.5)
nRBC: 0 % (ref 0.0–0.2)

## 2021-06-24 LAB — GLUCOSE, CAPILLARY
Glucose-Capillary: 100 mg/dL — ABNORMAL HIGH (ref 70–99)
Glucose-Capillary: 160 mg/dL — ABNORMAL HIGH (ref 70–99)
Glucose-Capillary: 172 mg/dL — ABNORMAL HIGH (ref 70–99)
Glucose-Capillary: 112 mg/dL — ABNORMAL HIGH (ref 70–99)
Glucose-Capillary: 132 mg/dL — ABNORMAL HIGH (ref 70–99)

## 2021-06-24 LAB — BASIC METABOLIC PANEL
Anion gap: 5 (ref 5–15)
BUN: 12 mg/dL (ref 6–20)
CO2: 24 mmol/L (ref 22–32)
Calcium: 7.7 mg/dL — ABNORMAL LOW (ref 8.9–10.3)
Chloride: 105 mmol/L (ref 98–111)
Creatinine, Ser: 0.77 mg/dL (ref 0.44–1.00)
GFR, Estimated: >60 mL/min (ref >60)
Glucose, Bld: 91 mg/dL (ref 70–99)
Potassium: 3.7 mmol/L (ref 3.5–5.1)
Sodium: 134 mmol/L — ABNORMAL LOW (ref 135–145)

## 2021-06-24 LAB — PROCALCITONIN: Procalcitonin: <0.1 ng/mL

## 2021-06-24 LAB — PROTIME-INR
INR: 1 (ref 0.8–1.2)
Prothrombin Time: 12.9 seconds (ref 11.4–15.2)

## 2021-06-24 LAB — CORTISOL-AM, BLOOD: Cortisol - AM: 7.5 ug/dL (ref 6.7–22.6)

## 2021-06-24 NOTE — Hospital Course (Addendum)
26 year old female with history of DM1, hypertension, coming with intractable nausea, vomiting and diarrhea, with associated sepsis due to UTI, and gastroenteritis.  Preliminary blood cultures negative.  Pending urine cultures.  UA was grossly abnormal with WBC more than 50.  She is managed IV antibiotics clinically improving.Lactic acid procalcitonin negative, WBC downtrending, hemodynamically stable.  Home insulin regimen resumed and blood sugar improving her A1c is 12.4 indicating poor baseline control and will need close follow-up with our endocrinologist or PCP.

## 2021-06-24 NOTE — Care Management (Signed)
  Transition of Care (TOC) Screening Note   Patient Details  Name: Mandy Patel Date of Birth: 1996-02-01   [facility] CM/SW Contact:    Caryn Section, RN Phone Number: 06/24/2021, 11:38 AM    Transition of Care Department [facility]) has reviewed patient and no TOC needs have been identified at this time. We will continue to monitor patient advancement through interdisciplinary progression rounds. If new patient transition needs arise, please place a TOC consult.

## 2021-06-24 NOTE — Progress Notes (Signed)
PROGRESS NOTE Mandy Patel  ZOX:[id number] DOB: [birth year] DOA: 06/23/2021 PCP: Patient, No Pcp Per (Inactive)   Brief Narrative/Hospital Course: 26 year old female with history of DM1, hypertension, coming with intractable nausea, vomiting and diarrhea, with associated sepsis due to UTI, and gastroenteritis.  Preliminary blood cultures negative.  Pending urine cultures.  UA was grossly abnormal with WBC more than 50.  She is managed IV antibiotics clinically improving.Lactic acid procalcitonin negative, WBC downtrending, hemodynamically stable.  Home insulin regimen resumed and blood sugar improving her A1c is 12.4 indicating poor baseline control and will need close follow-up with our endocrinologist or PCP.    Subjective: Seen and examined this morning reports he feels much improved today no nausea vomiting fever chills.   Assessment and Plan: Principal Problem:   Sepsis due to gram-negative UTI (HCC) Active Problems:   Uncontrolled type 1 diabetes mellitus with hyperglycemia, with long-term current use of insulin (HCC)   Hypertensive urgency   Acute gastroenteritis  Sepsis due to UTI: E coli Uti: Leukocytosis improving vitals stable, continue ceftriaxone anticipate discharge tomorrow after urine culture sensitivity resulted.    Hypertensive urgency: BP is controlled-continue Lopressor, also added losartan on admission due to proteinuria..  Cont prns  Uncontrolled type 1 diabetes mellitus with hyperglycemia, with long-term current use of insulin A1c 12.4.  Continue insulin 10 units levemir bedtime, 4 units Premeal and sliding scale.Follow-up with outpatient endocrinologist.  DM coordinator following  Acute gastroenteritis:likely viral.  Continue IV fluids antiemetics diet as tolerated.  DVT prophylaxis: enoxaparin (LOVENOX) injection 40 mg Start: 06/23/21 0800 Code Status:   Code Status: Full Code Family Communication: plan of care discussed with patient at bedside. Patient  status is: Inpatient because of ongoing management of sepsis UTI Level of care: Med-Surg   Dispo: The patient is from: home            Anticipated disposition: Home in 24 hours after urine culture sensitive resulted  Mobility Assessment (last 72 hours)     Mobility Assessment     Row Name 06/24/21 0730 06/23/21 1930 06/23/21 1600 06/23/21 0900     Does patient have an order for bedrest or is patient medically unstable No - Continue assessment No - Continue assessment No - Continue assessment No - Continue assessment    What is the highest level of mobility based on the progressive mobility assessment? Level 6 (Walks independently in room and hall) - Balance while walking in room without assist - Complete Level 6 (Walks independently in room and hall) - Balance while walking in room without assist - Complete Level 6 (Walks independently in room and hall) - Balance while walking in room without assist - Complete Level 6 (Walks independently in room and hall) - Balance while walking in room without assist - Complete              Objective: Vitals last 24 hrs: Vitals:   06/23/21 1935 06/24/21 0405 06/24/21 0845 06/24/21 0928  BP: 115/83 113/73 124/78 (!) 138/92  Pulse: (!) 109 (!) 103 (!) 104 (!) 106  Resp: Temp: 98.4 F (36.9 C) 98.3 F (36.8 C) 99.9 F (37.7 C) 98.2 F (36.8 C)  TempSrc:      SpO2: 100% 99% 100% 100%  Weight:      Height:       Weight change:   Physical Examination: General exam: alert awake,older than stated age, weak appearing. HEENT:Oral mucosa moist, Ear/Nose WNL grossly, dentition normal. Respiratory system: bilaterally diminished  BS, no use of accessory muscle Cardiovascular system: S1 & S2 +, No JVD. Gastrointestinal system: Abdomen soft,NT,ND, BS+ Nervous System:Alert, awake, moving extremities and grossly nonfocal Extremities: LE edema none,distal peripheral pulses palpable.  Skin: No rashes,no icterus. MSK: Normal muscle  bulk,tone, power  Medications reviewed:  Scheduled Meds:  enoxaparin (LOVENOX) injection  40 mg Subcutaneous Q24H   insulin aspart  0-15 Units Subcutaneous TID WC   insulin aspart  0-5 Units Subcutaneous QHS   insulin aspart  4 Units Subcutaneous TID WC   insulin detemir  10 Units Subcutaneous QHS   losartan  25 mg Oral Daily   metoCLOPramide (REGLAN) injection  5 mg Intravenous Q6H   metoprolol tartrate  25 mg Oral BID   Continuous Infusions:  sodium chloride 125 mL/hr at 06/24/21 0651   cefTRIAXone (ROCEPHIN)  IV 2 g (06/24/21 0427)   lactated ringers        Diet Order             Diet heart healthy/carb modified Room service appropriate? Yes; Fluid consistency: Thin  Diet effective now                            Intake/Output Summary (Last 24 hours) at 06/24/2021 1254 Last data filed at 06/24/2021 1015 Gross per 24 hour  Intake 1374.51 ml  Output --  Net 1374.51 ml   Net IO Since Admission: 3,824.9 mL [06/24/21 1254]  Wt Readings from Last 3 Encounters:  06/22/21 74.8 kg  02/27/20 73.9 kg  01/02/20 73.5 kg     Unresulted Labs (From admission, onward)    None     Data Reviewed: I have personally reviewed following labs and imaging studies CBC: Recent Labs  Lab 06/22/21 1919 06/24/21 0601  WBC 13.0* 11.9*  NEUTROABS 8.6*  --   HGB 11.3* 9.1*  HCT 34.0* 27.3*  MCV 92.6 93.2  PLT 315 255   Basic Metabolic Panel: Recent Labs  Lab 06/22/21 1919 06/24/21 0601  NA 138 134*  K 4.4 3.7  CL 104 105  CO2 26 24  GLUCOSE 228* 91  BUN 17 12  CREATININE 0.81 0.77  CALCIUM 8.9 7.7*   GFR: Estimated Creatinine Clearance: 108.4 mL/min (by C-G formula based on SCr of 0.77 mg/dL). Liver Function Tests: Recent Labs  Lab 06/22/21 1919  AST 13*  ALT 15  ALKPHOS 81  BILITOT 0.6  PROT 6.6  ALBUMIN 2.7*   Recent Labs  Lab 06/22/21 1919  LIPASE 23   No results for input(s): AMMONIA in the last 168 hours. Coagulation Profile: Recent Labs   Lab 06/24/21 0601  INR 1.0   BNP (last 3 results) No results for input(s): PROBNP in the last 8760 hours. HbA1C: Recent Labs    06/23/21 1108  HGBA1C 12.4*   CBG: Recent Labs  Lab 06/23/21 1151 06/23/21 1549 06/23/21 2156 06/24/21 0812 06/24/21 1122  GLUCAP 178* 93 152* 100* 160*   Lipid Profile: No results for input(s): CHOL, HDL, LDLCALC, TRIG, CHOLHDL, LDLDIRECT in the last 72 hours. Thyroid Function Tests: No results for input(s): TSH, T4TOTAL, FREET4, T3FREE, THYROIDAB in the last 72 hours. Sepsis Labs: Recent Labs  Lab 06/23/21 0136 06/23/21 0335 06/24/21 0601  PROCALCITON  --   --  <0.10  LATICACIDVEN 1.1 0.9  --     Recent Results (from the past 240 hour(s))  Resp Panel by RT-PCR (Flu A&B, Covid) Nasopharyngeal Swab     Status: None  Collection Time: 06/22/21  7:19 PM   Specimen: Nasopharyngeal Swab; Nasopharyngeal(NP) swabs in vial transport medium  Result Value Ref Range Status   SARS Coronavirus 2 by RT PCR NEGATIVE NEGATIVE Final    Comment: (NOTE) SARS-CoV-2 target nucleic acids are NOT DETECTED.  The SARS-CoV-2 RNA is generally detectable in upper respiratory specimens during the acute phase of infection. The lowest concentration of SARS-CoV-2 viral copies this assay can detect is 138 copies/mL. A negative result does not preclude SARS-Cov-2 infection and should not be used as the sole basis for treatment or other patient management decisions. A negative result may occur with  improper specimen collection/handling, submission of specimen other than nasopharyngeal swab, presence of viral mutation(s) within the areas targeted by this assay, and inadequate number of viral copies(<138 copies/mL). A negative result must be combined with clinical observations, patient history, and epidemiological information. The expected result is Negative.  Fact Sheet for Patients:  BloggerCourse.comhttps://www.fda.gov/media/152166/download  Fact Sheet for Healthcare  Providers:  SeriousBroker.ithttps://www.fda.gov/media/152162/download  This test is no t yet approved or cleared by the [place] FDA and  has been authorized for detection and/or diagnosis of SARS-CoV-2 by FDA under an Emergency Use Authorization (EUA). This EUA will remain  in effect (meaning this test can be used) for the duration of the COVID-19 declaration under Section 564(b)(1) of the Act, 21 U.S.C.section 360bbb-3(b)(1), unless the authorization is terminated  or revoked sooner.       Influenza A by PCR NEGATIVE NEGATIVE Final   Influenza B by PCR NEGATIVE NEGATIVE Final    Comment: (NOTE) The Xpert Xpress SARS-CoV-2/FLU/RSV plus assay is intended as an aid in the diagnosis of influenza from Nasopharyngeal swab specimens and should not be used as a sole basis for treatment. Nasal washings and aspirates are unacceptable for Xpert Xpress SARS-CoV-2/FLU/RSV testing.  Fact Sheet for Patients: BloggerCourse.comhttps://www.fda.gov/media/152166/download  Fact Sheet for Healthcare Providers: SeriousBroker.ithttps://www.fda.gov/media/152162/download  This test is not yet approved or cleared by the [place] FDA and has been authorized for detection and/or diagnosis of SARS-CoV-2 by FDA under an Emergency Use Authorization (EUA). This EUA will remain in effect (meaning this test can be used) for the duration of the COVID-19 declaration under Section 564(b)(1) of the Act, 21 U.S.C. section 360bbb-3(b)(1), unless the authorization is terminated or revoked.  Performed at [facility], [address] [place], [place] [address]   Urine Culture     Status: Abnormal (Preliminary result)   Collection Time: 06/22/21  7:19 PM   Specimen: Urine, Random  Result Value Ref Range Status   Specimen Description   Final    URINE, RANDOM Performed at [facility], [address] [place], [place] [address]    Special Requests   Final    NONE Performed at [facility], [address]  [place], [place] [address]    Culture (A)  Final    >=100,000 COLONIES/mL ESCHERICHIA COLI CULTURE REINCUBATED FOR BETTER GROWTH SUSCEPTIBILITIES TO FOLLOW Performed at [facility], [address] [place], [place] [address]    Report Status PENDING  Incomplete  Blood Culture (routine x 2)     Status: None (Preliminary result)   Collection Time: 06/23/21  1:36 AM   Specimen: BLOOD  Result Value Ref Range Status   Specimen Description BLOOD RIGHT FA  Final   Special Requests   Final    BOTTLES DRAWN AEROBIC AND ANAEROBIC Blood Culture results may not be optimal due to an inadequate volume of blood received  in culture bottles   Culture   Final    NO GROWTH 1 DAY Performed at [facility], [address] [place], [place] [address]    Report Status PENDING  Incomplete  Blood Culture (routine x 2)     Status: None (Preliminary result)   Collection Time: 06/23/21  1:36 AM   Specimen: BLOOD  Result Value Ref Range Status   Specimen Description BLOOD LEWFT HAND  Final   Special Requests   Final    BOTTLES DRAWN AEROBIC AND ANAEROBIC Blood Culture adequate volume   Culture   Final    NO GROWTH 1 DAY Performed at [facility], [address] [place], [place] [address]    Report Status PENDING  Incomplete    Antimicrobials: Anti-infectives (From admission, onward)    Start     Dose/Rate Route Frequency Ordered Stop   06/24/21 0500  cefTRIAXone (ROCEPHIN) 2 g in sodium chloride 0.9 % 100 mL IVPB        2 g 200 mL/hr over 30 Minutes Intravenous Every 24 hours 06/23/21 0510 06/30/21 0459   06/23/21 0530  cefTRIAXone (ROCEPHIN) 1 g in sodium chloride 0.9 % 100 mL IVPB  Status:  Discontinued        1 g 200 mL/hr over 30 Minutes Intravenous  Once 06/23/21 0515 06/23/21 0518   06/23/21 0445  cefTRIAXone (ROCEPHIN) 1 g in sodium chloride 0.9 % 100 mL IVPB  Status:  Discontinued        1 g 200 mL/hr over 30 Minutes Intravenous Every 24 hours 06/23/21 0437 06/23/21  0515   06/23/21 0445  azithromycin (ZITHROMAX) 500 mg in sodium chloride 0.9 % 250 mL IVPB  Status:  Discontinued        500 mg 250 mL/hr over 60 Minutes Intravenous Every 24 hours 06/23/21 0437 06/23/21 1248   06/23/21 0130  cefTRIAXone (ROCEPHIN) 1 g in sodium chloride 0.9 % 100 mL IVPB        1 g 200 mL/hr over 30 Minutes Intravenous  Once 06/23/21 0117 06/23/21 0203      Culture/Microbiology    Component Value Date/Time   SDES BLOOD RIGHT FA 06/23/2021 0136   SDES BLOOD LEWFT HAND 06/23/2021 0136   SPECREQUEST  06/23/2021 0136    BOTTLES DRAWN AEROBIC AND ANAEROBIC Blood Culture results may not be optimal due to an inadequate volume of blood received in culture bottles   SPECREQUEST  06/23/2021 0136    BOTTLES DRAWN AEROBIC AND ANAEROBIC Blood Culture adequate volume   CULT  06/23/2021 0136    NO GROWTH 1 DAY Performed at [facility], [address] [place] [place], [place] [address]    CULT  06/23/2021 0136    NO GROWTH 1 DAY Performed at [facility], [address] [place], [place] [address]    REPTSTATUS PENDING 06/23/2021 0136   REPTSTATUS PENDING 06/23/2021 0136    Radiology Studies: [facility] Chest Port 1 View  Result Date: 06/23/2021 CLINICAL DATA:  Sepsis. EXAM: PORTABLE CHEST 1 VIEW COMPARISON:  02/27/2020 FINDINGS: 0456 hours. The lungs are clear without focal pneumonia, edema, pneumothorax or pleural effusion. The cardiopericardial silhouette is within normal limits for size. The visualized bony structures of the thorax are unremarkable. Telemetry leads overlie the chest. IMPRESSION: No active disease. Electronically Signed   By: Kennith Center M.D.   On: 06/23/2021 05:33     LOS: 1 day   Lanae Boast, MD [facility]  06/24/2021, 12:54 PM

## 2021-06-24 NOTE — Progress Notes (Signed)
Inpatient Diabetes Program Recommendations  AACE/ADA: New Consensus Statement on Inpatient Glycemic Control (2015)  Target Ranges:  Prepandial:   less than 140 mg/dL      Peak postprandial:   less than 180 mg/dL (1-2 hours)      Critically ill patients:  140 - 180 mg/dL   Lab Results  Component Value Date   GLUCAP 160 (H) 06/24/2021   HGBA1C 12.4 (H) 06/23/2021    Went to see pt due to her having questions regarding insulin pump therapy. Spoke with pt regarding glucose control around pump therapy. Discussed parameters around A1c goals and pregnancy for patients reference. Discussed the need for Endocrinology support for insulin pump and resources with insulin pump trainers. Told pt that insurance would benefit with the cost of insulin pump therapy and supplies.   Thanks,  Christena Deem RN, MSN, BC-ADM Inpatient Diabetes Coordinator Team Pager [phone] (8a-5p)

## 2021-06-25 DIAGNOSIS — E1065 Type 1 diabetes mellitus with hyperglycemia: Secondary | ICD-10-CM | POA: Diagnosis not present

## 2021-06-25 DIAGNOSIS — K529 Noninfective gastroenteritis and colitis, unspecified: Secondary | ICD-10-CM | POA: Diagnosis not present

## 2021-06-25 DIAGNOSIS — I160 Hypertensive urgency: Secondary | ICD-10-CM | POA: Diagnosis not present

## 2021-06-25 DIAGNOSIS — A4150 Gram-negative sepsis, unspecified: Secondary | ICD-10-CM | POA: Diagnosis not present

## 2021-06-25 LAB — URINE CULTURE: Culture: >=100000 — AB

## 2021-06-25 LAB — CBC
HCT: 27.8 % — ABNORMAL LOW (ref 36.0–46.0)
Hemoglobin: 9.5 g/dL — ABNORMAL LOW (ref 12.0–15.0)
MCH: 31.9 pg (ref 26.0–34.0)
MCHC: 34.2 g/dL (ref 30.0–36.0)
MCV: 93.3 fL (ref 80.0–100.0)
Platelets: 249 10*3/uL (ref 150–400)
RBC: 2.98 MIL/uL — ABNORMAL LOW (ref 3.87–5.11)
RDW: 11.8 % (ref 11.5–15.5)
WBC: 11.5 10*3/uL — ABNORMAL HIGH (ref 4.0–10.5)
nRBC: 0 % (ref 0.0–0.2)

## 2021-06-25 LAB — GLUCOSE, CAPILLARY: Glucose-Capillary: 135 mg/dL — ABNORMAL HIGH (ref 70–99)

## 2021-06-25 MED ORDER — INSULIN LISPRO (1 UNIT DIAL) 100 UNIT/ML (KWIKPEN): PEN_INJECTOR | SUBCUTANEOUS | 0 refills | Status: DC

## 2021-06-25 MED ORDER — METOPROLOL TARTRATE 25 MG PO TABS: 25.0000 mg | ORAL_TABLET | Freq: Two times a day (BID) | ORAL | 0 refills | Status: DC

## 2021-06-25 MED ORDER — BLOOD GLUCOSE MONITOR KIT
PACK | 0 refills | Status: AC
Start: 2021-06-25 — End: ?

## 2021-06-25 MED ORDER — LOSARTAN POTASSIUM 25 MG PO TABS: 25.0000 mg | ORAL_TABLET | Freq: Every day | ORAL | 0 refills | Status: DC

## 2021-06-25 MED ORDER — CEPHALEXIN 500 MG PO CAPS: 500.0000 mg | ORAL_CAPSULE | Freq: Three times a day (TID) | ORAL | 0 refills | Status: AC

## 2021-06-25 MED ORDER — INSULIN DETEMIR 100 UNIT/ML FLEXPEN: 10.0000 [IU] | PEN_INJECTOR | Freq: Every day | SUBCUTANEOUS | 0 refills | Status: DC

## 2021-06-25 NOTE — Discharge Summary (Signed)
Physician Discharge Summary   Patient: Mandy Patel MRN: [id number] DOB: 06-21-1995  Admit date:     06/23/2021  Discharge date: 06/25/21  Discharge Physician: Loletha Grayer   PCP: Patient, No Pcp Per (Inactive)   Recommendations at discharge:   Follow-up PCP 5 days  Discharge Diagnoses: Principal Problem:   Sepsis due to gram-negative UTI ([place]) Active Problems:   Uncontrolled type 1 diabetes mellitus with hyperglycemia, with long-term current use of insulin (HCC)   Hypertensive urgency   Acute gastroenteritis   Hospital Course: 26 year old female with history of DM1, hypertension, coming with intractable nausea, vomiting and diarrhea, with associated sepsis due to UTI, and gastroenteritis.  Preliminary blood cultures negative.  Pending urine cultures.  UA was grossly abnormal with WBC more than 50.  She is managed IV antibiotics clinically improving.Lactic acid procalcitonin negative, WBC downtrending, hemodynamically stable.  Patient was feeling better at the time of discharge and discharged home on a few more days of Keflex.  She was given IV Rocephin while here in the hospital.  Assessment and Plan: * Sepsis due to gram-negative UTI ([place]) - Sepsis is manifested by leukocytosis, tachycardia and tachypnea. --The patient received 3 days of Rocephin while here in the hospital.  We will give Keflex for few more days upon going home.  E. coli growing out of the urine culture.  Hypertensive urgency - Patient was started on metoprolol and losartan  Uncontrolled type 1 diabetes mellitus with hyperglycemia, with long-term current use of insulin (HCC) - I refilled the patient's short acting and long-acting insulin upon going home.  Hemoglobin A1c elevated at 12.4.  Acute gastroenteritis -Resolved         Consultants: None Procedures performed: None Disposition: Home Diet recommendation:  Carb modified diet DISCHARGE MEDICATION: Allergies as of 06/25/2021        Reactions   Peanut Oil Other (See Comments)   Reaction to peanuts - gums feel numb and tingling   Other Swelling   Pecans caused throat swelling and weakness        Medication List     STOP taking these medications    ciprofloxacin 500 MG tablet Commonly known as: CIPRO   etonogestrel 68 MG Impl implant Commonly known as: NEXPLANON   insulin lispro 100 UNIT/ML injection Commonly known as: HumaLOG Replaced by: insulin lispro 100 UNIT/ML KwikPen       TAKE these medications    blood glucose meter kit and supplies Kit Dispense based on patient and insurance preference. Use up to four times daily as directed. (FOR ICD-9 250.00, 250.01). Check sugars before meals What changed: additional instructions   cephALEXin 500 MG capsule Commonly known as: KEFLEX Take 1 capsule (500 mg total) by mouth 3 (three) times daily for 7 doses.   insulin detemir 100 UNIT/ML FlexPen Commonly known as: LEVEMIR Inject 10 Units into the skin daily. What changed: when to take this   insulin lispro 100 UNIT/ML KwikPen Commonly known as: HUMALOG 4 units prior to meals (okay to substitute any short acting insulin that insurance coivers) Replaces: insulin lispro 100 UNIT/ML injection   losartan 25 MG tablet Commonly known as: COZAAR Take 1 tablet (25 mg total) by mouth daily.   metoprolol tartrate 25 MG tablet Commonly known as: LOPRESSOR Take 1 tablet (25 mg total) by mouth 2 (two) times daily.        Follow-up Information     your medical doctor Follow up in 5 day(s).  Discharge Exam: Filed Weights   06/22/21 1916  Weight: 74.8 kg   Physical Exam HENT:     Head: Normocephalic.     Mouth/Throat:     Pharynx: No oropharyngeal exudate.  Eyes:     General: Lids are normal.     Conjunctiva/sclera: Conjunctivae normal.  Cardiovascular:     Rate and Rhythm: Regular rhythm. Tachycardia present.     Heart sounds: Normal heart sounds, S1 normal and S2  normal.  Pulmonary:     Breath sounds: Normal breath sounds. No decreased breath sounds, wheezing, rhonchi or rales.  Abdominal:     Palpations: Abdomen is soft.     Tenderness: There is no abdominal tenderness.  Genitourinary:    Comments: No CVA tenderness Musculoskeletal:     Right lower leg: No swelling.     Left lower leg: No swelling.  Skin:    General: Skin is warm.     Findings: No rash.  Neurological:     Mental Status: She is alert and oriented to person, place, and time.     Condition at discharge: stable  The results of significant diagnostics from this hospitalization (including imaging, microbiology, ancillary and laboratory) are listed below for reference.   Imaging Studies: DG Chest Port 1 View  Result Date: 06/23/2021 CLINICAL DATA:  Sepsis. EXAM: PORTABLE CHEST 1 VIEW COMPARISON:  02/27/2020 FINDINGS: 0456 hours. The lungs are clear without focal pneumonia, edema, pneumothorax or pleural effusion. The cardiopericardial silhouette is within normal limits for size. The visualized bony structures of the thorax are unremarkable. Telemetry leads overlie the chest. IMPRESSION: No active disease. Electronically Signed   By: Misty Stanley M.D.   On: 06/23/2021 05:33    Microbiology: Results for orders placed or performed during the hospital encounter of 06/23/21  Resp Panel by RT-PCR (Flu A&B, Covid) Nasopharyngeal Swab     Status: None   Collection Time: 06/22/21  7:19 PM   Specimen: Nasopharyngeal Swab; Nasopharyngeal(NP) swabs in vial transport medium  Result Value Ref Range Status   SARS Coronavirus 2 by RT PCR NEGATIVE NEGATIVE Final    Comment: (NOTE) SARS-CoV-2 target nucleic acids are NOT DETECTED.  The SARS-CoV-2 RNA is generally detectable in upper respiratory specimens during the acute phase of infection. The lowest concentration of SARS-CoV-2 viral copies this assay can detect is 138 copies/mL. A negative result does not preclude SARS-Cov-2 infection and  should not be used as the sole basis for treatment or other patient management decisions. A negative result may occur with  improper specimen collection/handling, submission of specimen other than nasopharyngeal swab, presence of viral mutation(s) within the areas targeted by this assay, and inadequate number of viral copies(<138 copies/mL). A negative result must be combined with clinical observations, patient history, and epidemiological information. The expected result is Negative.  Fact Sheet for Patients:  EntrepreneurPulse.com.au  Fact Sheet for Healthcare Providers:  IncredibleEmployment.be  This test is no t yet approved or cleared by the [place] FDA and  has been authorized for detection and/or diagnosis of SARS-CoV-2 by FDA under an Emergency Use Authorization (EUA). This EUA will remain  in effect (meaning this test can be used) for the duration of the COVID-19 declaration under Section 564(b)(1) of the Act, 21 U.S.C.section 360bbb-3(b)(1), unless the authorization is terminated  or revoked sooner.       Influenza A by PCR NEGATIVE NEGATIVE Final   Influenza B by PCR NEGATIVE NEGATIVE Final    Comment: (NOTE) The Xpert Xpress SARS-CoV-2/FLU/RSV plus  assay is intended as an aid in the diagnosis of influenza from Nasopharyngeal swab specimens and should not be used as a sole basis for treatment. Nasal washings and aspirates are unacceptable for Xpert Xpress SARS-CoV-2/FLU/RSV testing.  Fact Sheet for Patients: EntrepreneurPulse.com.au  Fact Sheet for Healthcare Providers: IncredibleEmployment.be  This test is not yet approved or cleared by the [place] FDA and has been authorized for detection and/or diagnosis of SARS-CoV-2 by FDA under an Emergency Use Authorization (EUA). This EUA will remain in effect (meaning this test can be used) for the duration of the COVID-19 declaration  under Section 564(b)(1) of the Act, 21 U.S.C. section 360bbb-3(b)(1), unless the authorization is terminated or revoked.  Performed at [facility], [address] [place], [place] [address]   Urine Culture     Status: Abnormal   Collection Time: 06/22/21  7:19 PM   Specimen: Urine, Random  Result Value Ref Range Status   Specimen Description   Final    URINE, RANDOM Performed at [facility], [address] [place], [place] [address]    Special Requests   Final    NONE Performed at [facility], [address] [place], [place] [address]    Culture >=100,000 COLONIES/mL ESCHERICHIA COLI (A)  Final   Report Status 06/25/2021 FINAL  Final   Organism ID, Bacteria ESCHERICHIA COLI (A)  Final      Susceptibility   Escherichia coli - MIC*    AMPICILLIN >=32 RESISTANT Resistant     CEFAZOLIN <=4 SENSITIVE Sensitive     CEFEPIME <=0.12 SENSITIVE Sensitive     CEFTRIAXONE <=0.25 SENSITIVE Sensitive     CIPROFLOXACIN >=4 RESISTANT Resistant     GENTAMICIN <=1 SENSITIVE Sensitive     IMIPENEM <=0.25 SENSITIVE Sensitive     NITROFURANTOIN <=16 SENSITIVE Sensitive     TRIMETH/SULFA <=20 SENSITIVE Sensitive     AMPICILLIN/SULBACTAM 4 SENSITIVE Sensitive     PIP/TAZO <=4 SENSITIVE Sensitive     * >=100,000 COLONIES/mL ESCHERICHIA COLI  Blood Culture (routine x 2)     Status: None (Preliminary result)   Collection Time: 06/23/21  1:36 AM   Specimen: BLOOD  Result Value Ref Range Status   Specimen Description BLOOD RIGHT FA  Final   Special Requests   Final    BOTTLES DRAWN AEROBIC AND ANAEROBIC Blood Culture results may not be optimal due to an inadequate volume of blood received in culture bottles   Culture   Final    NO GROWTH 2 DAYS Performed at [facility], [address], [place], [place] [address]    Report Status PENDING  Incomplete  Blood Culture (routine x 2)     Status: None (Preliminary result)   Collection Time: 06/23/21  1:36  AM   Specimen: BLOOD  Result Value Ref Range Status   Specimen Description BLOOD LEWFT HAND  Final   Special Requests   Final    BOTTLES DRAWN AEROBIC AND ANAEROBIC Blood Culture adequate volume   Culture   Final    NO GROWTH 2 DAYS Performed at [facility], [address] [place], [place] [address]    Report Status PENDING  Incomplete    Labs: CBC: Recent Labs  Lab 06/22/21 1919 06/24/21 0601 06/25/21 0359  WBC 13.0* 11.9* 11.5*  NEUTROABS 8.6*  --   --   HGB 11.3* 9.1* 9.5*  HCT 34.0* 27.3* 27.8*  MCV 92.6 93.2 93.3  PLT 315 255 208   Basic Metabolic  Panel: Recent Labs  Lab 06/22/21 1919 06/24/21 0601  NA 138 134*  K 4.4 3.7  CL 104 105  CO2 26 24  GLUCOSE 228* 91  BUN 17 12  CREATININE 0.81 0.77  CALCIUM 8.9 7.7*   Liver Function Tests: Recent Labs  Lab 06/22/21 1919  AST 13*  ALT 15  ALKPHOS 81  BILITOT 0.6  PROT 6.6  ALBUMIN 2.7*   CBG: Recent Labs  Lab 06/24/21 1122 06/24/21 1302 06/24/21 1703 06/24/21 2029 06/25/21 0823  GLUCAP 160* 172* 112* 132* 135*    Discharge time spent: greater than 30 minutes.  Signed: Loletha Grayer, MD [facility] 06/25/2021

## 2021-06-28 LAB — CULTURE, BLOOD (ROUTINE X 2)
Culture: NO GROWTH
Culture: NO GROWTH
Special Requests: ADEQUATE

## 2021-07-29 ENCOUNTER — Telehealth: Payer: Self-pay | Admitting: *Deleted

## 2021-07-29 NOTE — Telephone Encounter (Signed)
Spoke with mom earlier and advised first available isnt until late July. Declined scheduling.

## 2021-07-29 NOTE — Telephone Encounter (Signed)
See access nurse call. Patient's mom called wanting to get her daughter established here with a PCP. Please call and schedule appointment.

## 2021-07-29 NOTE — Telephone Encounter (Signed)
PLEASE NOTE: All timestamps contained within this report are represented as [place] Standard Time. CONFIDENTIALTY NOTICE: This fax transmission is intended only for the addressee. It contains information that is legally privileged, confidential or otherwise protected from use or disclosure. If you are not the intended recipient, you are strictly prohibited from reviewing, disclosing, copying using or disseminating any of this information or taking any action in reliance on or regarding this information. If you have received this fax in error, please notify us immediately by telephone so that we can arrange for its return to [place]. Phone: [phone], Toll-Free: [phone], Fax: [phone] Page: 1 of 1 Call Id: [id number] [facility] Night - Client Nonclinical Telephone Record  AccessNurse Client [facility] Night - Client Client Site [facility] [place] - Night Contact Type Call Who Is Calling Patient / Member / Family / Caregiver Caller Name Dawnelle Warman Caller Phone Number [phone] Patient Name Mandy Patel Patient DOB 01/27/1996 Call Type Message Only Information Provided Reason for Call Request for General Office Information Initial Comment Caller states that she would like to schedule an appt for her daughter to get established with the office. Additional Comment Caller states that she would like to schedule a new patient appt for her daughter. Office hours provided. Disp. Time Disposition Final User 07/28/2021 5:32:24 PM General Information Provided Yes Joselyn Glassman Call Closed By: Joselyn Glassman Transaction Date/Time: 07/28/2021 5:29:14 PM (ET)

## 2021-08-26 ENCOUNTER — Ambulatory Visit: Payer: BC Managed Care – PPO | Admitting: Nurse Practitioner

## 2021-08-31 ENCOUNTER — Ambulatory Visit (INDEPENDENT_AMBULATORY_CARE_PROVIDER_SITE_OTHER): Payer: BC Managed Care – PPO | Admitting: Nurse Practitioner

## 2021-08-31 ENCOUNTER — Encounter: Payer: Self-pay | Admitting: Nurse Practitioner

## 2021-08-31 VITALS — BP 132/84 | HR 102 | Temp 97.5°F | Resp 12 | Ht 67.5 in | Wt 178.5 lb

## 2021-08-31 DIAGNOSIS — E1065 Type 1 diabetes mellitus with hyperglycemia: Secondary | ICD-10-CM | POA: Diagnosis not present

## 2021-08-31 DIAGNOSIS — E1159 Type 2 diabetes mellitus with other circulatory complications: Secondary | ICD-10-CM | POA: Diagnosis not present

## 2021-08-31 DIAGNOSIS — I152 Hypertension secondary to endocrine disorders: Secondary | ICD-10-CM | POA: Diagnosis not present

## 2021-08-31 DIAGNOSIS — E1059 Type 1 diabetes mellitus with other circulatory complications: Secondary | ICD-10-CM

## 2021-08-31 DIAGNOSIS — Z794 Long term (current) use of insulin: Secondary | ICD-10-CM | POA: Diagnosis not present

## 2021-08-31 MED ORDER — METOPROLOL TARTRATE 25 MG PO TABS: 25.0000 mg | ORAL_TABLET | Freq: Two times a day (BID) | ORAL | 3 refills | Status: DC

## 2021-08-31 MED ORDER — LOSARTAN POTASSIUM 25 MG PO TABS: 25.0000 mg | ORAL_TABLET | Freq: Every day | ORAL | 1 refills | Status: DC

## 2021-08-31 MED ORDER — LANTUS SOLOSTAR 100 UNIT/ML ~~LOC~~ SOPN: 10.0000 [IU] | PEN_INJECTOR | Freq: Every day | SUBCUTANEOUS | 0 refills | Status: DC

## 2021-08-31 NOTE — Assessment & Plan Note (Signed)
type I diabetic since she was a child.  States she has had it controlled at 1 point in life and being her parents insurance.  Recent hospitalization she was placed on Humalog for mealtime and Levemir for long-acting.  Patient states Levemir was extremely expensive approximately $500.  She is currently has been doing the Humalog as of late we will switch her to Lantus.  Patient would requested Lantus pens we will see what insurance says if not we may have to get a Lantus vial which patient is okay with follow-up in 1 month.  Also discussed CGM with patient as it has been scientifically proven to help get better control of A1c's.  Patient still hesitant but will think about it

## 2021-08-31 NOTE — Patient Instructions (Signed)
Nice to see you today I sent in for the lantus pens. Make sure they are the pens before you leave the pharmacy. If this is too expensive call the office and let me know or send me a mychart message I want to see you in 1 month to see how you are doing Think about one of the continuous glucose monitors as they have shown to help get the A1C under control

## 2021-08-31 NOTE — Assessment & Plan Note (Signed)
Patient currently maintained on losartan and metoprolol.  Refilled both medications today.  Patient is tolerating medications well blood pressure within normal limits in office today.

## 2021-08-31 NOTE — Progress Notes (Signed)
New Patient Office Visit  Subjective    Patient ID: Mandy Patel, female    DOB: 09-Jun-1995  Age: 26 y.o. MRN: [id number]  CC:  Chief Complaint  Patient presents with   Establish Care    No previous PCP-did not have insurance for a long time.    Medication Management    Discuss insulin-Levemir is going to cost around $500. She is using Humalog.    HPI Mandy Patel presents to establish care  DM1: States that she checks her sugar 3 times a day. States morning before lunch and before dinner. High: has been 287 Low: 90.  HTN: does have a cuff. Twice a day. States that it normally on the little high side.   Pap smear: GYN last year for Nexplanon removal.  They did not get a Pap   Outpatient Encounter Medications as of 08/31/2021  Medication Sig   blood glucose meter kit and supplies KIT Dispense based on patient and insurance preference. Use up to four times daily as directed. (FOR ICD-9 250.00, 250.01). Check sugars before meals   insulin glargine (LANTUS SOLOSTAR) 100 UNIT/ML Solostar Pen Inject 10 Units into the skin at bedtime.   insulin lispro (HUMALOG) 100 UNIT/ML KwikPen 4 units prior to meals (okay to substitute any short acting insulin that insurance coivers)   [DISCONTINUED] losartan (COZAAR) 25 MG tablet Take 1 tablet (25 mg total) by mouth daily.   [DISCONTINUED] metoprolol tartrate (LOPRESSOR) 25 MG tablet Take 1 tablet (25 mg total) by mouth 2 (two) times daily.   losartan (COZAAR) 25 MG tablet Take 1 tablet (25 mg total) by mouth daily.   metoprolol tartrate (LOPRESSOR) 25 MG tablet Take 1 tablet (25 mg total) by mouth 2 (two) times daily.   [DISCONTINUED] insulin detemir (LEVEMIR) 100 UNIT/ML FlexPen Inject 10 Units into the skin daily. (Patient not taking: Reported on 08/31/2021)   No facility-administered encounter medications on file as of 08/31/2021.    Past Medical History:  Diagnosis Date   DKA (diabetic ketoacidosis) ([place])    Pyelonephritis  03/2017   Type 1 diabetes [facility])     diagnosed age 19yo    Past Surgical History:  Procedure Laterality Date   EYE MUSCLE SURGERY  approx 2010   bilat eye surgury, left exotropia worse;Dr Annamaria Boots, opthomology    Family History  Problem Relation Age of Onset   Diabetes Mother        type 2   Hypertension Father    Kidney disease Sister        had kidney transplant.   Diabetes Sister        type 1   Stroke Maternal Grandmother    Diabetes Maternal Grandmother    COPD Maternal Grandfather    Alcohol abuse Maternal Grandfather    Arthritis Paternal Grandmother     Social History   Socioeconomic History   Marital status: Single    Spouse name: Not on file   Number of children: 0   Years of education: Not on file   Highest education level: High school graduate  Occupational History   Occupation: Lexicographer: UNEMPLOYED  Tobacco Use   Smoking status: Never    Passive exposure: Past   Smokeless tobacco: Never  Vaping Use   Vaping Use: Never used  Substance and Sexual Activity   Alcohol use: No   Drug use: Yes    Types: Marijuana    Comment: occasionally states on the weekends  Sexual activity: Yes  Other Topics Concern   Not on file  Social History Narrative   Fulltime: [employer]   Social Determinants of Health   Financial Resource Strain: Not on file  Food Insecurity: Not on file  Transportation Needs: Not on file  Physical Activity: Not on file  Stress: Not on file  Social Connections: Not on file  Intimate Partner Violence: Not on file    Review of Systems  Constitutional:  Positive for malaise/fatigue (intermittent). Negative for chills and fever.  Respiratory:  Negative for shortness of breath.   Cardiovascular:  Negative for chest pain and leg swelling.  Gastrointestinal:  Negative for abdominal pain, nausea and vomiting.       BM every other day  Genitourinary:  Negative for dysuria and hematuria.  Neurological:  Negative for tingling and  headaches.  Psychiatric/Behavioral:  Negative for hallucinations and suicidal ideas.         Objective    BP 132/84   Pulse (!) 102   Temp (!) 97.5 F (36.4 C)   Resp 12   Ht 5' 7.5" (1.715 m)   Wt 178 lb 8 oz (81 kg)   LMP 08/20/2021   SpO2 99%   BMI 27.54 kg/m   Physical Exam Vitals and nursing note reviewed.  Constitutional:      Appearance: Normal appearance.  HENT:     Right Ear: Tympanic membrane, ear canal and external ear normal.     Left Ear: Tympanic membrane and ear canal normal.     Mouth/Throat:     Mouth: Mucous membranes are moist.     Pharynx: Oropharynx is clear.  Eyes:     Extraocular Movements: Extraocular movements intact.     Pupils: Pupils are equal, round, and reactive to light.  Cardiovascular:     Rate and Rhythm: Normal rate and regular rhythm.     Pulses: Normal pulses.     Heart sounds: Normal heart sounds.  Pulmonary:     Effort: Pulmonary effort is normal.     Breath sounds: Normal breath sounds.  Abdominal:     General: Bowel sounds are normal. There is no distension.     Palpations: There is no mass.     Tenderness: There is no abdominal tenderness.     Hernia: No hernia is present.  Musculoskeletal:     Right lower leg: 1+ Pitting Edema present.     Left lower leg: 1+ Pitting Edema present.  Lymphadenopathy:     Cervical: No cervical adenopathy.  Skin:    General: Skin is warm.  Neurological:     General: No focal deficit present.     Mental Status: She is alert.     Deep Tendon Reflexes:     Reflex Scores:      Bicep reflexes are 2+ on the right side and 2+ on the left side.      Patellar reflexes are 2+ on the right side and 2+ on the left side.    Comments: Bilateral upper and lower extremity strength 5/5  Psychiatric:        Mood and Affect: Mood normal.        Behavior: Behavior normal.        Thought Content: Thought content normal.        Judgment: Judgment normal.         Assessment & Plan:   Problem  List Items Addressed This Visit       Cardiovascular and Mediastinum  Hypertension associated with diabetes ([place])    Patient currently maintained on losartan and metoprolol.  Refilled both medications today.  Patient is tolerating medications well blood pressure within normal limits in office today.      Relevant Medications   losartan (COZAAR) 25 MG tablet   metoprolol tartrate (LOPRESSOR) 25 MG tablet   insulin glargine (LANTUS SOLOSTAR) 100 UNIT/ML Solostar Pen     Endocrine   Uncontrolled type 1 diabetes mellitus with hyperglycemia, with long-term current use of insulin ([place]) - Primary     type I diabetic since she was a child.  States she has had it controlled at 1 point in life and being her parents insurance.  Recent hospitalization she was placed on Humalog for mealtime and Levemir for long-acting.  Patient states Levemir was extremely expensive approximately $500.  She is currently has been doing the Humalog as of late we will switch her to Lantus.  Patient would requested Lantus pens we will see what insurance says if not we may have to get a Lantus vial which patient is okay with follow-up in 1 month.  Also discussed CGM with patient as it has been scientifically proven to help get better control of A1c's.  Patient still hesitant but will think about it      Relevant Medications   losartan (COZAAR) 25 MG tablet   insulin glargine (LANTUS SOLOSTAR) 100 UNIT/ML Solostar Pen   Other Relevant Orders   CBC   Comprehensive metabolic panel   Microalbumin / creatinine urine ratio    Return in about 4 weeks (around 09/28/2021) for DM recheck and titration of Lantus.Romilda Garret, NP

## 2021-09-01 LAB — CBC
Hematocrit: 31.3 % — ABNORMAL LOW (ref 34.0–46.6)
Hemoglobin: 10.4 g/dL — ABNORMAL LOW (ref 11.1–15.9)
MCH: 31.7 pg (ref 26.6–33.0)
MCHC: 33.2 g/dL (ref 31.5–35.7)
MCV: 95 fL (ref 79–97)
Platelets: 301 10*3/uL (ref 150–450)
RBC: 3.28 x10E6/uL — ABNORMAL LOW (ref 3.77–5.28)
RDW: 12.5 % (ref 11.7–15.4)
WBC: 9.5 10*3/uL (ref 3.4–10.8)

## 2021-09-01 LAB — COMPREHENSIVE METABOLIC PANEL
ALT: 17 IU/L (ref 0–32)
AST: 14 IU/L (ref 0–40)
Albumin/Globulin Ratio: 1.1 — ABNORMAL LOW (ref 1.2–2.2)
Albumin: 3 g/dL — ABNORMAL LOW (ref 4.0–5.0)
Alkaline Phosphatase: 107 IU/L (ref 44–121)
BUN/Creatinine Ratio: 22 (ref 9–23)
BUN: 22 mg/dL — ABNORMAL HIGH (ref 6–20)
Bilirubin Total: <0.2 mg/dL (ref 0.0–1.2)
CO2: 19 mmol/L — ABNORMAL LOW (ref 20–29)
Calcium: 8.7 mg/dL (ref 8.7–10.2)
Chloride: 103 mmol/L (ref 96–106)
Creatinine, Ser: 1.02 mg/dL — ABNORMAL HIGH (ref 0.57–1.00)
Globulin, Total: 2.8 g/dL (ref 1.5–4.5)
Glucose: 268 mg/dL — ABNORMAL HIGH (ref 70–99)
Potassium: 4.4 mmol/L (ref 3.5–5.2)
Sodium: 133 mmol/L — ABNORMAL LOW (ref 134–144)
Total Protein: 5.8 g/dL — ABNORMAL LOW (ref 6.0–8.5)
eGFR: 78 mL/min/{1.73_m2} (ref >59)

## 2021-09-01 LAB — MICROALBUMIN / CREATININE URINE RATIO
Creatinine, Urine: 46.9 mg/dL
Microalb/Creat Ratio: 6744 mg/g creat — ABNORMAL HIGH (ref 0–29)
Microalbumin, Urine: 3162.8 ug/mL

## 2021-09-03 ENCOUNTER — Encounter: Payer: Self-pay | Admitting: Nurse Practitioner

## 2021-10-01 ENCOUNTER — Encounter: Payer: Self-pay | Admitting: Nurse Practitioner

## 2021-10-01 ENCOUNTER — Ambulatory Visit (INDEPENDENT_AMBULATORY_CARE_PROVIDER_SITE_OTHER): Payer: BC Managed Care – PPO | Admitting: Nurse Practitioner

## 2021-10-01 VITALS — BP 130/72 | HR 88 | Temp 97.4°F | Resp 16 | Ht 67.5 in | Wt 182.4 lb

## 2021-10-01 DIAGNOSIS — I152 Hypertension secondary to endocrine disorders: Secondary | ICD-10-CM

## 2021-10-01 DIAGNOSIS — Z794 Long term (current) use of insulin: Secondary | ICD-10-CM

## 2021-10-01 DIAGNOSIS — E1065 Type 1 diabetes mellitus with hyperglycemia: Secondary | ICD-10-CM

## 2021-10-01 DIAGNOSIS — E1159 Type 2 diabetes mellitus with other circulatory complications: Secondary | ICD-10-CM | POA: Diagnosis not present

## 2021-10-01 LAB — POCT GLYCOSYLATED HEMOGLOBIN (HGB A1C): Hemoglobin A1C: 11.3 % — AB (ref 4.0–5.6)

## 2021-10-01 MED ORDER — INSULIN LISPRO (1 UNIT DIAL) 100 UNIT/ML (KWIKPEN): PEN_INJECTOR | SUBCUTANEOUS | 0 refills | Status: DC

## 2021-10-01 NOTE — Progress Notes (Signed)
Established Patient Office Visit  Subjective   Patient ID: Mandy Patel, female    DOB: [birth year]  Age: 26 y.o. MRN: [id number]  Chief Complaint  Patient presents with   Diabetes    Follow up -sugar readings at home morning time-mid to high 100s, during the day low to mid 200s and night time also.      DM: States that in the morining 150/199 mid 200s and bedtime low 200s. Hypoglycemia: 100 is the lowest Hyperglycemia: 286. Patient currently using glargine insulin 10 units nightly.  Along with 4 to 6 units sliding scale throughout the day.  Has had no true hypoglycemia.  HTN: Did have a cuff but it is no longer working per patient report, she is no longer able to check blood pressure at home. Losartan and metoprolol currently. Tolerating the medications well. States she is experiencing bilateral lower extremity edema.  States she was dealing with this in the hospital setting was placed on appropriate blood pressure medicine that resolved.  But now has come back she is wearing "compression socks".  Patient states they are not very tight        Review of Systems  Constitutional:  Negative for chills and fever.  Respiratory:  Negative for shortness of breath.   Cardiovascular:  Positive for leg swelling. Negative for chest pain.  Neurological:  Positive for headaches.      Objective:     BP 130/72   Pulse 88   Temp (!) 97.4 F (36.3 C) (Temporal)   Resp 16   Ht 5' 7.5" (1.715 m)   Wt 182 lb 6 oz (82.7 kg)   LMP 09/15/2021   BMI 28.14 kg/m    Physical Exam Vitals and nursing note reviewed.  Constitutional:      Appearance: Normal appearance.  Cardiovascular:     Rate and Rhythm: Normal rate and regular rhythm.     Pulses:          Dorsalis pedis pulses are 2+ on the right side and 2+ on the left side.     Heart sounds: Normal heart sounds.  Pulmonary:     Effort: Pulmonary effort is normal.     Breath sounds: Normal breath sounds.  Musculoskeletal:      Right lower leg: 1+ Pitting Edema present.     Left lower leg: 1+ Pitting Edema present.  Skin:    General: Skin is warm.  Neurological:     Mental Status: She is alert.    Diabetic Foot Form - Detailed   Diabetic Foot Exam - detailed Diabetic Foot exam was performed with the following findings: Yes 10/01/2021  3:05 PM  Is there swelling or and abnormal foot shape?: No Is there a claw toe deformity?: No Is there elevated skin temparature?: No Pulse Foot Exam completed.: Yes   Right posterior Tibialias: Present Left posterior Tibialias: Present   Right Dorsalis Pedis: Present Left Dorsalis Pedis: Present  Sensory Foot Exam Completed.: Yes Semmes-Weinstein Monofilament Test   Comments: All 10 sites tested patient able to feel        Results for orders placed or performed in visit on 10/01/21  POCT glycosylated hemoglobin (Hb A1C)  Result Value Ref Range   Hemoglobin A1C 11.3 (A) 4.0 - 5.6 %   HbA1c POC (<> result, manual entry)     HbA1c, POC (prediabetic range)     HbA1c, POC (controlled diabetic range)        The ASCVD Risk score (Arnett  DK, et al., 2019) failed to calculate for the following reasons:   The 2019 ASCVD risk score is only valid for ages 83 to 66    Assessment & Plan:   Problem List Items Addressed This Visit       Cardiovascular and Mediastinum   Hypertension associated with diabetes [facility])    Patient currently maintained on losartan and metoprolol.  Taking medication as prescribed tolerating medications well per patient report.  Blood pressure elevated at beginning of office visit today and became within limits.  Continue taking medication as prescribed      Relevant Medications   insulin lispro (HUMALOG) 100 UNIT/ML KwikPen     Endocrine   Uncontrolled type 1 diabetes mellitus with hyperglycemia, with long-term current use of insulin (HCC) - Primary    Patient was unable to afford Levemir we did switch her to Lantus and it is affordable.   Patient's been taking 10 units nightly.  Glucoses have been around 150 in the morning hesitant to increase that currently.  Patient is having higher glucoses throughout the day we will increase her Humalog insulin to 6 units before every meal.  Patient began to check blood glucose approximate 3 times a day.  Did encourage patient to consider CGM, she will get back to me on that.  Also ambulatory referral placed endocrinology for optimal management.  Patient's A1c did trend down from 12.4 to 11.3% lowest has been in the past 10 years      Relevant Medications   insulin lispro (HUMALOG) 100 UNIT/ML KwikPen   Other Relevant Orders   POCT glycosylated hemoglobin (Hb A1C) (Completed)   Amb ref to Medical Nutrition Therapy-MNT   Ambulatory referral to Endocrinology    Return in about 3 months (around 12/31/2021) for Dm recheck .    Audria Nine, NP

## 2021-10-01 NOTE — Assessment & Plan Note (Signed)
Patient was unable to afford Levemir we did switch her to Lantus and it is affordable.  Patient's been taking 10 units nightly.  Glucoses have been around 150 in the morning hesitant to increase that currently.  Patient is having higher glucoses throughout the day we will increase her Humalog insulin to 6 units before every meal.  Patient began to check blood glucose approximate 3 times a day.  Did encourage patient to consider CGM, she will get back to me on that.  Also ambulatory referral placed endocrinology for optimal management.  Patient's A1c did trend down from 12.4 to 11.3% lowest has been in the past 10 years

## 2021-10-01 NOTE — Assessment & Plan Note (Signed)
Patient currently maintained on losartan and metoprolol.  Taking medication as prescribed tolerating medications well per patient report.  Blood pressure elevated at beginning of office visit today and became within limits.  Continue taking medication as prescribed

## 2021-10-01 NOTE — Patient Instructions (Signed)
Nice to see you today I changed the Humalog insulin to 6 units before the meals. Let me know if you decide to do the continuous glucose monitor Follow up with me in 3 months

## 2021-10-05 ENCOUNTER — Encounter: Payer: Self-pay | Admitting: Nurse Practitioner

## 2021-10-05 DIAGNOSIS — R600 Localized edema: Secondary | ICD-10-CM

## 2021-10-06 MED ORDER — FUROSEMIDE 20 MG PO TABS: 20.0000 mg | ORAL_TABLET | Freq: Every day | ORAL | 0 refills | Status: DC

## 2021-10-06 NOTE — Telephone Encounter (Signed)
Called and spoke with patient made her a lab appt for 10/13/21.

## 2021-10-06 NOTE — Telephone Encounter (Signed)
Can we get the patient scheduled for a 1 week lab visit only please. Order is already placed

## 2021-10-12 ENCOUNTER — Ambulatory Visit: Payer: BC Managed Care – PPO | Admitting: *Deleted

## 2021-10-13 ENCOUNTER — Other Ambulatory Visit (INDEPENDENT_AMBULATORY_CARE_PROVIDER_SITE_OTHER): Payer: BC Managed Care – PPO

## 2021-10-13 DIAGNOSIS — R600 Localized edema: Secondary | ICD-10-CM | POA: Diagnosis not present

## 2021-10-13 LAB — BASIC METABOLIC PANEL
BUN: 21 mg/dL (ref 6–23)
CO2: 23 mEq/L (ref 19–32)
Calcium: 8.6 mg/dL (ref 8.4–10.5)
Chloride: 101 mEq/L (ref 96–112)
Creatinine, Ser: 1.09 mg/dL (ref 0.40–1.20)
GFR: 70.42 mL/min (ref >60.00)
Glucose, Bld: 183 mg/dL — ABNORMAL HIGH (ref 70–99)
Potassium: 4.9 mEq/L (ref 3.5–5.1)
Sodium: 133 mEq/L — ABNORMAL LOW (ref 135–145)

## 2021-10-14 MED ORDER — FUROSEMIDE 20 MG PO TABS: 20.0000 mg | ORAL_TABLET | Freq: Every day | ORAL | 0 refills | Status: DC

## 2021-10-20 ENCOUNTER — Ambulatory Visit: Payer: BC Managed Care – PPO | Admitting: *Deleted

## 2021-10-22 ENCOUNTER — Encounter: Payer: Self-pay | Admitting: Nurse Practitioner

## 2021-10-22 DIAGNOSIS — E1065 Type 1 diabetes mellitus with hyperglycemia: Secondary | ICD-10-CM

## 2021-10-26 ENCOUNTER — Ambulatory Visit (INDEPENDENT_AMBULATORY_CARE_PROVIDER_SITE_OTHER): Payer: BC Managed Care – PPO | Admitting: Obstetrics and Gynecology

## 2021-10-26 ENCOUNTER — Encounter: Payer: Self-pay | Admitting: Obstetrics and Gynecology

## 2021-10-26 ENCOUNTER — Other Ambulatory Visit (HOSPITAL_COMMUNITY)
Admission: RE | Admit: 2021-10-26 | Discharge: 2021-10-26 | Disposition: A | Discharge status: Home / Self Care | Payer: BC Managed Care – PPO | Source: Ambulatory Visit | Attending: Obstetrics and Gynecology | Admitting: Obstetrics and Gynecology

## 2021-10-26 VITALS — BP 110/70 | Ht 68.0 in | Wt 187.0 lb

## 2021-10-26 DIAGNOSIS — Z124 Encounter for screening for malignant neoplasm of cervix: Secondary | ICD-10-CM

## 2021-10-26 DIAGNOSIS — Z113 Encounter for screening for infections with a predominantly sexual mode of transmission: Secondary | ICD-10-CM | POA: Insufficient documentation

## 2021-10-26 DIAGNOSIS — N7011 Chronic salpingitis: Secondary | ICD-10-CM | POA: Diagnosis not present

## 2021-10-26 DIAGNOSIS — Z01419 Encounter for gynecological examination (general) (routine) without abnormal findings: Secondary | ICD-10-CM | POA: Diagnosis not present

## 2021-10-26 DIAGNOSIS — Z3169 Encounter for other general counseling and advice on procreation: Secondary | ICD-10-CM

## 2021-10-26 NOTE — Patient Instructions (Signed)
I value your feedback and you entrusting us with your care. If you get a [place] patient survey, I would appreciate you taking the time to let us know about your experience today. Thank you! ? ? ?

## 2021-10-26 NOTE — Progress Notes (Signed)
PCP:  Michela Pitcher, NP   Chief Complaint  Patient presents with   Gynecologic Exam     HPI:      [name] is a 26 y.o. G0P0000 whose LMP was Patient's last menstrual period was 10/05/2021 (approximate)., presents today for her annual examination.  Her menses are regular every 28-30 days, lasting 4-5 days.  Dysmenorrhea mild to mod, improved with NSAIDs. She does not have intermenstrual bleeding.  Sex activity: single partner, contraception - none. Conception ok but not actively trying. Taking PNVs. Did nexplanon in past. Pt states she has had a positive pregnancy test in past but then thinks she had miscarriage. Has been with current partner since 1/23 without BC and hasn't conceived. He lives out of town and they only see each other once monthly. He has children. She has a hx of possible hydrosalpinx bilat on GYN u/s results from 02/27/20 with neg gon/chlam testing; treated with rocephin anyway.   Last Pap: not recent, declined at 12/21 appt Hx of STDs: none  There is no FH of breast cancer. There is no FH of ovarian cancer. The patient does do self-breast exams.  Tobacco use: The patient denies current or previous tobacco use. Alcohol use: none No drug use.  Exercise: moderately active  She does get adequate calcium and Vitamin D in her diet.  Hx of type 1 DM with subsequent HTN. On several meds, wants to conceive.   Patient Active Problem List   Diagnosis Date Noted   Hypertension associated with diabetes ([place]) 08/31/2021   Sepsis due to gram-negative UTI ([place]) 06/23/2021   Hypertensive urgency 06/23/2021   Acute gastroenteritis 06/23/2021   AKI (acute kidney injury) ([place]) 02/27/2020   Hydrosalpinx    Type 1 diabetes mellitus without complication ([place]) 50/53/9767   DKA, type 1 ([place]) 06/16/2018   Hyperkalemia 06/16/2018   Acute renal failure (ARF) ([place]) 06/16/2018   Elevated liver enzymes 06/16/2018   Sepsis ([place]) 09/04/2017   Type 1 diabetes mellitus with  hyperglycemia ([place]) 03/15/2017   Acute pyelonephritis 03/15/2017   Acute right flank pain 03/15/2017   Influenza A 03/06/2017   Nausea vomiting and diarrhea 03/06/2017   DKA (diabetic ketoacidoses) 06/12/2015   Uncontrolled type 1 diabetes mellitus with hyperglycemia, with long-term current use of insulin ([place]) 04/26/2011   Preventative health care 12/01/2010   Obesity 06/19/2010   Goiter, unspecified 06/19/2010   HYPERCHOLESTEROLEMIA 04/25/2008    Past Surgical History:  Procedure Laterality Date   EYE MUSCLE SURGERY  approx 2010   bilat eye surgury, left exotropia worse;Dr Annamaria Boots, opthomology    Family History  Problem Relation Age of Onset   Diabetes Mother        type 2   Hypertension Father    Kidney disease Sister        had kidney transplant.   Diabetes Sister        type 1   Stroke Maternal Grandmother    Diabetes Maternal Grandmother    COPD Maternal Grandfather    Alcohol abuse Maternal Grandfather    Arthritis Paternal Grandmother     Social History   Socioeconomic History   Marital status: Single    Spouse name: Not on file   Number of children: 0   Years of education: Not on file   Highest education level: High school graduate  Occupational History   Occupation: Lexicographer: UNEMPLOYED  Tobacco Use   Smoking status: Never    Passive  exposure: Past   Smokeless tobacco: Never  Vaping Use   Vaping Use: Never used  Substance and Sexual Activity   Alcohol use: No   Drug use: Yes    Types: Marijuana    Comment: occasionally states on the weekends   Sexual activity: Yes    Birth control/protection: None  Other Topics Concern   Not on file  Social History Narrative   Fulltime: [employer]   Social Determinants of Health   Financial Resource Strain: Not on file  Food Insecurity: Not on file  Transportation Needs: Not on file  Physical Activity: Not on file  Stress: Not on file  Social Connections: Not on file  Intimate Partner Violence:  Not on file     Current Outpatient Medications:    blood glucose meter kit and supplies KIT, Dispense based on patient and insurance preference. Use up to four times daily as directed. (FOR ICD-9 250.00, 250.01). Check sugars before meals, Disp: 1 each, Rfl: 0   furosemide (LASIX) 20 MG tablet, Take 1 tablet (20 mg total) by mouth daily., Disp: 90 tablet, Rfl: 0   insulin glargine (LANTUS SOLOSTAR) 100 UNIT/ML Solostar Pen, Inject 10 Units into the skin at bedtime., Disp: 15 mL, Rfl: 0   insulin lispro (HUMALOG) 100 UNIT/ML KwikPen, 6 units prior to meals (okay to substitute any short acting insulin that insurance coivers)4 units prior to meals (okay to substitute any short acting insulin that insurance coivers), Disp: 15 mL, Rfl: 0   losartan (COZAAR) 25 MG tablet, Take 1 tablet (25 mg total) by mouth daily., Disp: 90 tablet, Rfl: 1   metoprolol tartrate (LOPRESSOR) 25 MG tablet, Take 1 tablet (25 mg total) by mouth 2 (two) times daily., Disp: 60 tablet, Rfl: 3     ROS:  Review of Systems  Constitutional:  Negative for fatigue, fever and unexpected weight change.  Respiratory:  Negative for cough, shortness of breath and wheezing.   Cardiovascular:  Negative for chest pain, palpitations and leg swelling.  Gastrointestinal:  Negative for blood in stool, constipation, diarrhea, nausea and vomiting.  Endocrine: Negative for cold intolerance, heat intolerance and polyuria.  Genitourinary:  Negative for dyspareunia, dysuria, flank pain, frequency, genital sores, hematuria, menstrual problem, pelvic pain, urgency, vaginal bleeding, vaginal discharge and vaginal pain.  Musculoskeletal:  Negative for back pain, joint swelling and myalgias.  Skin:  Negative for rash.  Neurological:  Negative for dizziness, syncope, light-headedness, numbness and headaches.  Hematological:  Negative for adenopathy.  Psychiatric/Behavioral:  Negative for agitation, confusion, sleep disturbance and suicidal ideas.  The patient is not nervous/anxious.    BREAST: No symptoms   Objective: BP 110/70   Ht 5' 8"  (1.727 m)   Wt 187 lb (84.8 kg)   LMP 10/05/2021 (Approximate)   BMI 28.43 kg/m    Physical Exam Constitutional:      Appearance: She is well-developed.  Genitourinary:     Vulva normal.     Right Labia: No rash, tenderness or lesions.    Left Labia: No tenderness, lesions or rash.    No vaginal discharge, erythema or tenderness.      Right Adnexa: not tender and no mass present.    Left Adnexa: not tender and no mass present.    No cervical friability or polyp.     Uterus is not enlarged or tender.  Breasts:    Right: No mass, nipple discharge, skin change or tenderness.     Left: No mass, nipple discharge, skin change  or tenderness.  Neck:     Thyroid: No thyromegaly.  Cardiovascular:     Rate and Rhythm: Normal rate and regular rhythm.     Heart sounds: Normal heart sounds. No murmur heard. Pulmonary:     Effort: Pulmonary effort is normal.     Breath sounds: Normal breath sounds.  Abdominal:     Palpations: Abdomen is soft.     Tenderness: There is no abdominal tenderness. There is no guarding or rebound.  Musculoskeletal:        General: Normal range of motion.     Cervical back: Normal range of motion.  Lymphadenopathy:     Cervical: No cervical adenopathy.  Neurological:     General: No focal deficit present.     Mental Status: She is alert and oriented to person, place, and time.     Cranial Nerves: No cranial nerve deficit.  Skin:    General: Skin is warm and dry.  Psychiatric:        Mood and Affect: Mood normal.        Behavior: Behavior normal.        Thought Content: Thought content normal.        Judgment: Judgment normal.  Vitals reviewed.     Assessment/Plan: Encounter for annual routine gynecological examination  Cervical cancer screening - Plan: Cytology - PAP  Screening for STD (sexually transmitted disease) - Plan: Cytology -  PAP  Hydrosalpinx - Plan: US PELVIS TRANSVAGINAL NON-OB (TV ONLY); neg STD testing at that time 1/22; check STDs, recheck GYN u/s to see if resolved. Discussed possibility of affecting fallopian tubes for conception.  Pre-conception counseling--cont PNVs, keep period tracker for ovulatory times with partner (see each other only once a month). Wait till trying to conceive a year with current partner. May need HSG given hydrosalpinx hx.  Meds reviewed. Will want to change HTN meds during pregnancy.             GYN counsel adequate intake of calcium and vitamin D, diet and exercise     F/U  Return in about 1 week (around 11/02/2021) for GYN u/s for hydrosalpinx f/u--[facility] to call with results.  [place] Pam B. Roldan Laforest, PA-C 10/26/2021 3:37 PM

## 2021-10-28 ENCOUNTER — Encounter: Payer: Self-pay | Admitting: Internal Medicine

## 2021-10-28 ENCOUNTER — Ambulatory Visit (INDEPENDENT_AMBULATORY_CARE_PROVIDER_SITE_OTHER): Payer: BC Managed Care – PPO | Admitting: Internal Medicine

## 2021-10-28 VITALS — BP 128/82 | HR 89 | Ht 68.0 in | Wt 184.0 lb

## 2021-10-28 DIAGNOSIS — E1065 Type 1 diabetes mellitus with hyperglycemia: Secondary | ICD-10-CM

## 2021-10-28 LAB — POCT GLUCOSE (DEVICE FOR HOME USE): POC Glucose: 219 mg/dl — AB (ref 70–99)

## 2021-10-28 MED ORDER — INSULIN PEN NEEDLE 31G X 5 MM MISC: 1.0000 | Freq: Four times a day (QID) | 2 refills | Status: DC

## 2021-10-28 MED ORDER — DEXCOM G7 SENSOR MISC: 1.0000 | 2 refills | Status: DC

## 2021-10-28 MED ORDER — INSULIN LISPRO (1 UNIT DIAL) 100 UNIT/ML (KWIKPEN): PEN_INJECTOR | SUBCUTANEOUS | 2 refills | Status: DC

## 2021-10-28 MED ORDER — LANTUS SOLOSTAR 100 UNIT/ML ~~LOC~~ SOPN: 12.0000 [IU] | PEN_INJECTOR | Freq: Every day | SUBCUTANEOUS | 2 refills | Status: DC

## 2021-10-28 NOTE — Patient Instructions (Addendum)
INcrease Lantus to 12 units daily  INcrease Humalog 8 units with each meal  Humalog correctional insulin: ADD extra units on insulin to your meal-time Humalog dose if your blood sugars are higher than 175. Use the scale below to help guide you:   Blood sugar before meal Number of units to inject  Less than 175 0 unit  176 -  220 1 units  221 -  265 2 units  266 -  310 3 units  311 -  355 4 units  356 -  400 5 units  401 -  445 6 units    Look Up  Tandem / Tslim insulin pump and Omnipod 5 pumps   HOW TO TREAT LOW BLOOD SUGARS (Blood sugar LESS THAN 70 MG/DL) Please follow the RULE OF 15 for the treatment of hypoglycemia treatment (when your (blood sugars are less than 70 mg/dL)   STEP 1: Take 15 grams of carbohydrates when your blood sugar is low, which includes:  3-4 GLUCOSE TABS  OR 3-4 OZ OF JUICE OR REGULAR SODA OR ONE TUBE OF GLUCOSE GEL    STEP 2: RECHECK blood sugar in 15 MINUTES STEP 3: If your blood sugar is still low at the 15 minute recheck --> then, go back to STEP 1 and treat AGAIN with another 15 grams of carbohydrates.

## 2021-10-28 NOTE — Progress Notes (Unsigned)
Name: Mandy Patel  MRN/ DOB: [id number], 1995/03/28   Age/ Sex: 26 y.o., female    PCP: Michela Pitcher, NP   Reason for Endocrinology Evaluation: Type 1 Diabetes Mellitus     Date of Initial Endocrinology Visit: 10/28/2021     PATIENT IDENTIFIER: Mandy Patel is a 26 y.o. female with a past medical history of T1DM. The patient presented for initial endocrinology clinic visit on 10/28/2021 for consultative assistance with her diabetes management.    HPI: Mandy Patel was    Diagnosed with DM at age 20 Currently checking blood sugars 3 x / day,  before meals .  Hypoglycemia episodes : no                Hemoglobin A1c has ranged from 11.3% in 2023, peaking at 13.5% in 2012. Patient required assistance for hypoglycemia:  Patient has required hospitalization within the last 1 year from hyper or hypoglycemia: May, 2023 for sepsis   In terms of diet, the patient eats 3 meals a day   LMP 9/5th, regular   She uses condoms for contraception   Denies nausea, vomiting or diarrhea  Has rare momentary tingling of feet  She was prescribed    HOME DIABETES REGIMEN: Lantus 10 units daily  Humalog 4-6 units TIQAC      Statin: no ACE-I/ARB: yes    METER DOWNLOAD SUMMARY: did not bring     DIABETIC COMPLICATIONS: Microvascular complications:   Denies: CKD Last eye exam: Completed 2 yrs   Macrovascular complications:  Denies: CAD, PVD, CVA   PAST HISTORY: Past Medical History:  Past Medical History:  Diagnosis Date   DKA (diabetic ketoacidosis) ([place])    Pyelonephritis 03/2017   Type 1 diabetes ([place])     diagnosed age 58yo   Past Surgical History:  Past Surgical History:  Procedure Laterality Date   EYE MUSCLE SURGERY  approx 2010   bilat eye surgury, left exotropia worse;Dr Annamaria Boots, opthomology    Social History:  reports that she has never smoked. She has been exposed to tobacco smoke. She has never used smokeless tobacco. She reports current drug use.  Drug: Marijuana. She reports that she does not drink alcohol. Family History:  Family History  Problem Relation Age of Onset   Diabetes Mother        type 2   Hypertension Father    Kidney disease Sister        had kidney transplant.   Diabetes Sister        type 1   Stroke Maternal Grandmother    Diabetes Maternal Grandmother    COPD Maternal Grandfather    Alcohol abuse Maternal Grandfather    Arthritis Paternal Grandmother      HOME MEDICATIONS: Allergies as of 10/28/2021       Reactions   Peanut Oil Other (See Comments)   Reaction to peanuts - gums feel numb and tingling   Other Swelling   Pecans caused throat swelling and weakness        Medication List        Accurate as of October 28, 2021 12:57 PM. If you have any questions, ask your nurse or doctor.          blood glucose meter kit and supplies Kit Dispense based on patient and insurance preference. Use up to four times daily as directed. (FOR ICD-9 250.00, 250.01). Check sugars before meals   furosemide 20 MG tablet Commonly known as: LASIX Take 1  tablet (20 mg total) by mouth daily. What changed:  when to take this reasons to take this   insulin lispro 100 UNIT/ML KwikPen Commonly known as: HUMALOG 6 units prior to meals (okay to substitute any short acting insulin that insurance coivers)4 units prior to meals (okay to substitute any short acting insulin that insurance coivers)   Lantus SoloStar 100 UNIT/ML Solostar Pen Generic drug: insulin glargine Inject 10 Units into the skin at bedtime.   losartan 25 MG tablet Commonly known as: COZAAR Take 1 tablet (25 mg total) by mouth daily.   metoprolol tartrate 25 MG tablet Commonly known as: LOPRESSOR Take 1 tablet (25 mg total) by mouth 2 (two) times daily.         ALLERGIES: Allergies  Allergen Reactions   Peanut Oil Other (See Comments)    Reaction to peanuts - gums feel numb and tingling   Other Swelling    Pecans caused throat  swelling and weakness     REVIEW OF SYSTEMS: A comprehensive ROS was conducted with the patient and is negative except as per HPI    OBJECTIVE:   VITAL SIGNS: BP 128/82 (BP Location: Left Arm, Patient Position: Sitting, Cuff Size: Small)   Pulse 89   Ht _0  (1.727 m)   Wt 184 lb (83.5 kg)   LMP 10/05/2021 (Approximate)   SpO2 98%   BMI 27.98 kg/m    PHYSICAL EXAM:  General: Pt appears well and is in NAD  Neck: General: Supple without adenopathy or carotid bruits. Thyroid: Thyroid size normal.  No goiter or nodules appreciated.   Lungs: Clear with good BS bilat with no rales, rhonchi, or wheezes  Heart: RRR   Abdomen:  soft, nontender  Extremities:  Lower extremities - Trace pretibial edema  Neuro: MS is good with appropriate affect, pt is alert and Ox3    DM foot exam: 10/01/2021 per PCP    DATA REVIEWED:  Lab Results  Component Value Date   HGBA1C 11.3 (A) 10/01/2021   HGBA1C 12.4 (H) 06/23/2021   HGBA1C 13.0 (H) 02/27/2020   Lab Results  Component Value Date   MICROALBUR 0.8 12/01/2010   LDLCALC 92 12/01/2010   CREATININE 1.09 10/13/2021   Lab Results  Component Value Date   MICRALBCREAT 6,744 (H) 08/31/2021    Lab Results  Component Value Date   CHOL 163 12/01/2010   HDL 57.70 12/01/2010   LDLCALC 92 12/01/2010   LDLDIRECT 142.4 04/25/2008   TRIG 68.0 12/01/2010   CHOLHDL 3 12/01/2010        ASSESSMENT / PLAN / RECOMMENDATIONS:   1) Type 1 Diabetes Mellitus, Poorly controlled, Without complications - Most recent A1c of 11.3 %. Goal A1c < 7.0 %.    Plan: GENERAL: I have discussed with the patient the pathophysiology of diabetes. We went over the natural progression of the disease. We stressed the importance of lifestyle changes. I explained the complications associated with diabetes including retinopathy, nephropathy, neuropathy as well as increased risk of cardiovascular disease. We went over the benefit seen with glycemic control.   I  explained to the patient that diabetic patients are at higher than normal risk for amputations.  Dexcom G7 sent to pharmacy and was given a sample  Preconception  counsleing done, pt advised to avoid pregnancy and to use birth control unless A1c <7.0 %  I will make the following changes to her insulin  Will prescribe dexcom G7  I have asked her to look in to  the coverage of Tandem and Omnipod pumps   MEDICATIONS: Increase Lantus 12 units daily  Increase Humalog 8 units TIDQAC CF: Humalog (BG-130/45)  EDUCATION / INSTRUCTIONS: BG monitoring instructions: Patient is instructed to check her blood sugars 3 times a day, before meals . Call [facility] if: BG persistently < 70  I reviewed the Rule of 15 for the treatment of hypoglycemia in detail with the patient. Literature supplied.   2) Diabetic complications:  Eye: Does not have known diabetic retinopathy.  Neuro/ Feet: Does not have known diabetic peripheral neuropathy. Renal: Patient does not have known baseline CKD. She is  on an ACEI/ARB at present.         Signed electronically by: Mack Guise, MD  [facility]  [facility] [address] [place], [place] [address] Phone: [phone] FAX: [phone]   CC: Michela Pitcher, NP [address] [place] Phone: [phone]  Fax: [phone]    Return to Endocrinology clinic as below: Future Appointments  Date Time Provider [place]  10/28/2021  1:00 PM Jordi Lacko, Melanie Crazier, MD [facility] None  12/31/2021  3:40 PM Charmian Muff Alyson Locket, NP [facility]

## 2021-11-03 ENCOUNTER — Encounter: Payer: Self-pay | Admitting: Obstetrics and Gynecology

## 2021-11-03 LAB — CYTOLOGY - PAP
Adequacy: ABSENT
Chlamydia: NEGATIVE
Comment: NEGATIVE
Comment: NEGATIVE
Comment: NORMAL
Diagnosis: UNDETERMINED — AB
High risk HPV: NEGATIVE
Neisseria Gonorrhea: NEGATIVE

## 2021-11-06 ENCOUNTER — Encounter: Payer: Self-pay | Admitting: Nurse Practitioner

## 2021-11-06 ENCOUNTER — Ambulatory Visit
Admission: RE | Admit: 2021-11-06 | Discharge: 2021-11-06 | Disposition: A | Discharge status: Home / Self Care | Payer: BC Managed Care – PPO | Source: Ambulatory Visit | Attending: Obstetrics and Gynecology | Admitting: Obstetrics and Gynecology

## 2021-11-06 DIAGNOSIS — N7011 Chronic salpingitis: Secondary | ICD-10-CM | POA: Insufficient documentation

## 2021-11-06 DIAGNOSIS — Z8742 Personal history of other diseases of the female genital tract: Secondary | ICD-10-CM | POA: Diagnosis not present

## 2021-11-06 DIAGNOSIS — D251 Intramural leiomyoma of uterus: Secondary | ICD-10-CM | POA: Diagnosis not present

## 2021-11-16 ENCOUNTER — Encounter: Payer: Self-pay | Admitting: Nurse Practitioner

## 2021-11-16 MED ORDER — METOPROLOL SUCCINATE ER 50 MG PO TB24: 50.0000 mg | ORAL_TABLET | Freq: Every day | ORAL | 0 refills | Status: DC

## 2021-12-31 ENCOUNTER — Ambulatory Visit (INDEPENDENT_AMBULATORY_CARE_PROVIDER_SITE_OTHER): Payer: BC Managed Care – PPO | Admitting: Nurse Practitioner

## 2021-12-31 ENCOUNTER — Encounter: Payer: Self-pay | Admitting: Nurse Practitioner

## 2021-12-31 VITALS — BP 132/90 | HR 83 | Temp 98.2°F | Resp 10 | Ht 68.0 in | Wt 192.0 lb

## 2021-12-31 DIAGNOSIS — R600 Localized edema: Secondary | ICD-10-CM | POA: Diagnosis not present

## 2021-12-31 DIAGNOSIS — E1065 Type 1 diabetes mellitus with hyperglycemia: Secondary | ICD-10-CM

## 2021-12-31 DIAGNOSIS — I152 Hypertension secondary to endocrine disorders: Secondary | ICD-10-CM

## 2021-12-31 DIAGNOSIS — E1059 Type 1 diabetes mellitus with other circulatory complications: Secondary | ICD-10-CM

## 2021-12-31 LAB — POCT GLYCOSYLATED HEMOGLOBIN (HGB A1C): Hemoglobin A1C: 8.6 % — AB (ref 4.0–5.6)

## 2021-12-31 NOTE — Patient Instructions (Signed)
Nice to see you today Keep up the good work A1C is 8.6 today. Keep your appointment with Dr. Lonzo Cloud. I want to see you 6 months, sooner if you need me

## 2021-12-31 NOTE — Assessment & Plan Note (Signed)
Patient currently followed by endocrinology.  Lantus 12 units daily along with NovoLog 10 units 3 times daily with meals.  A1c has come down from 11-8.6.  Continue following with endocrinology take medication as prescribed.

## 2021-12-31 NOTE — Assessment & Plan Note (Signed)
Patient blood pressure slightly above goal today.  Patient currently on losartan, metoprolol and Lasix as needed.  Patient's blood pressure the last few office have been normotensive.  Will continue to monitor parameters given when to call the office in regards to elevated blood pressure.  Continue medication as prescribed

## 2021-12-31 NOTE — Assessment & Plan Note (Signed)
Presents today in office visit.  Patient does take Lasix as needed that seems to be beneficial.  Patient states she is not taking it as of late because she has been really busy continue taking Lasix on a as needed basis.

## 2021-12-31 NOTE — Progress Notes (Signed)
Established Patient Office Visit  Subjective   Patient ID: Mandy Patel, female    DOB: May 24, 1995  Age: 26 y.o. MRN: [id number]  Chief Complaint  Patient presents with   Diabetes    Follow up- checks sugar sometimes, in the morning readings average out around high 100s to low 200s, and in the evening around low 200-nothing over 250.       DM1: Patient currently maintained on Lantus 12 units and NovoLog 10 units 3 times daily.  She is being followed by endocrinology Dr. Lonzo Cloud.  We did check A1c today is down from 11-8.6.  She will continue following with Dr. Lonzo Cloud.  Will relinquish all diabetic medications and management to her.  Patient is checking her blood glucose we did discuss the possibility by getting a CGM and the benefits of it  BP: Currently on metoprolol and losartan she will take lasix.  Does check her blood pressure at home. States that she will check it twice. States that she is getting 80 on thebottom and unsure of the top  Started taking 10mg  melatonin states that it has been over the past couple of months that she has been having trouble going to sleep.States that it could be her bed states that it is 5 years    Review of Systems  Constitutional:  Negative for chills and fever.  Respiratory:  Negative for shortness of breath.   Cardiovascular:  Positive for leg swelling. Negative for chest pain.  Neurological:  Negative for dizziness and headaches.      Objective:     BP (!) 132/90   Pulse 83   Temp 98.2 F (36.8 C)   Resp 10   Ht 5\' 8"  (1.727 m)   Wt 192 lb (87.1 kg)   SpO2 100%   BMI 29.19 kg/m  BP Readings from Last 3 Encounters:  12/31/21 (!) 132/90  10/28/21 128/82  10/26/21 110/70   Wt Readings from Last 3 Encounters:  12/31/21 192 lb (87.1 kg)  10/28/21 184 lb (83.5 kg)  10/26/21 187 lb (84.8 kg)      Physical Exam Vitals and nursing note reviewed.  Constitutional:      Appearance: Normal appearance.  Cardiovascular:      Rate and Rhythm: Normal rate and regular rhythm.     Heart sounds: Normal heart sounds.  Pulmonary:     Breath sounds: Normal breath sounds.  Musculoskeletal:     Right lower leg: Edema present.     Left lower leg: Edema present.  Neurological:     Mental Status: She is alert.      Results for orders placed or performed in visit on 12/31/21  POCT glycosylated hemoglobin (Hb A1C)  Result Value Ref Range   Hemoglobin A1C 8.6 (A) 4.0 - 5.6 %   HbA1c POC (<> result, manual entry)     HbA1c, POC (prediabetic range)     HbA1c, POC (controlled diabetic range)        The ASCVD Risk score (Arnett DK, et al., 2019) failed to calculate for the following reasons:   The 2019 ASCVD risk score is only valid for ages 13 to 42    Assessment & Plan:   Problem List Items Addressed This Visit       Cardiovascular and Mediastinum   Hypertension associated with diabetes (HCC)    Patient blood pressure slightly above goal today.  Patient currently on losartan, metoprolol and Lasix as needed.  Patient's blood pressure the last few  office have been normotensive.  Will continue to monitor parameters given when to call the office in regards to elevated blood pressure.  Continue medication as prescribed        Endocrine   Uncontrolled type 1 diabetes mellitus with hyperglycemia, with long-term current use of insulin (HCC) - Primary    Patient currently followed by endocrinology.  Lantus 12 units daily along with NovoLog 10 units 3 times daily with meals.  A1c has come down from 11-8.6.  Continue following with endocrinology take medication as prescribed.      Relevant Orders   POCT glycosylated hemoglobin (Hb A1C) (Completed)     Other   Bilateral lower extremity edema    Presents today in office visit.  Patient does take Lasix as needed that seems to be beneficial.  Patient states she is not taking it as of late because she has been really busy continue taking Lasix on a as needed basis.        Return in about 6 months (around 07/01/2022) for CPE and Labs.    Audria Nine, NP

## 2022-01-16 ENCOUNTER — Encounter: Payer: Self-pay | Admitting: Nurse Practitioner

## 2022-01-16 DIAGNOSIS — E1159 Type 2 diabetes mellitus with other circulatory complications: Secondary | ICD-10-CM

## 2022-01-18 MED ORDER — METOPROLOL SUCCINATE ER 50 MG PO TB24: 50.0000 mg | ORAL_TABLET | Freq: Every day | ORAL | 1 refills | Status: DC

## 2022-01-18 MED ORDER — LOSARTAN POTASSIUM 25 MG PO TABS: 25.0000 mg | ORAL_TABLET | Freq: Every day | ORAL | 1 refills | Status: DC

## 2022-02-15 NOTE — Telephone Encounter (Signed)
Request for insulin refill

## 2022-02-16 ENCOUNTER — Other Ambulatory Visit: Payer: Self-pay | Admitting: Internal Medicine

## 2022-02-16 DIAGNOSIS — E1065 Type 1 diabetes mellitus with hyperglycemia: Secondary | ICD-10-CM

## 2022-02-16 MED ORDER — INSULIN LISPRO (1 UNIT DIAL) 100 UNIT/ML (KWIKPEN): PEN_INJECTOR | SUBCUTANEOUS | 2 refills | Status: DC

## 2022-03-12 ENCOUNTER — Encounter: Payer: Self-pay | Admitting: Internal Medicine

## 2022-03-12 ENCOUNTER — Ambulatory Visit: Payer: BC Managed Care – PPO | Admitting: Internal Medicine

## 2022-03-17 ENCOUNTER — Encounter: Payer: Self-pay | Admitting: Internal Medicine

## 2022-03-17 ENCOUNTER — Encounter: Payer: Self-pay | Admitting: Nurse Practitioner

## 2022-03-17 ENCOUNTER — Ambulatory Visit (INDEPENDENT_AMBULATORY_CARE_PROVIDER_SITE_OTHER): Payer: Managed Care, Other (non HMO) | Admitting: Internal Medicine

## 2022-03-17 VITALS — BP 132/80 | HR 68 | Ht 68.0 in | Wt 190.0 lb

## 2022-03-17 DIAGNOSIS — E1065 Type 1 diabetes mellitus with hyperglycemia: Secondary | ICD-10-CM | POA: Diagnosis not present

## 2022-03-17 DIAGNOSIS — R600 Localized edema: Secondary | ICD-10-CM

## 2022-03-17 LAB — POCT GLUCOSE (DEVICE FOR HOME USE): Glucose Fasting, POC: 151 mg/dL — AB (ref 70–99)

## 2022-03-17 LAB — POCT GLYCOSYLATED HEMOGLOBIN (HGB A1C): Hemoglobin A1C: 8.1 % — AB (ref 4.0–5.6)

## 2022-03-17 MED ORDER — LANTUS SOLOSTAR 100 UNIT/ML ~~LOC~~ SOPN: 14.0000 [IU] | PEN_INJECTOR | Freq: Every day | SUBCUTANEOUS | 2 refills | Status: DC

## 2022-03-17 MED ORDER — INSULIN LISPRO (1 UNIT DIAL) 100 UNIT/ML (KWIKPEN): PEN_INJECTOR | SUBCUTANEOUS | 2 refills | Status: AC

## 2022-03-17 MED ORDER — INSULIN PEN NEEDLE 31G X 5 MM MISC: 1.0000 | Freq: Four times a day (QID) | 2 refills | Status: AC

## 2022-03-17 NOTE — Progress Notes (Signed)
Name: Mandy Patel  MRN/ DOB: [id number], Jun 18, 1995   Age/ Sex: 27 y.o., female    PCP: Michela Pitcher, NP   Reason for Endocrinology Evaluation: Type 1 Diabetes Mellitus     Date of Initial Endocrinology Visit: 10/28/2021    PATIENT IDENTIFIER: Mandy Patel is a 27 y.o. female with a past medical history of T1DM. The patient presented for initial endocrinology clinic visit on 10/28/2021 for consultative assistance with her diabetes management.    HPI: Mandy Patel was    Diagnosed with DM at age 69          Hemoglobin A1c has ranged from 11.3% in 2023, peaking at 13.5% in 2012.  On her initial visit to our clinic she had an A1c of 11.3%, we adjusted MDI regimen and a prescription for Dexcom was provided She was also advised to look into insulin pumps  SUBJECTIVE:   During the last visit (10/28/2021): A1c 11.3 percent  Today (03/17/22): Mandy Patel is here for a follow up on diabetes management.  She tried the dexcom but did not like the way it made her feel. She checks her blood sugars 2 times daily. The patient did not bring her meter today   A couple weeks ago she had viral gastritis with diarrhea but this has resolved  She has self increased Lantus from 12 units to 14 units due to persistent hypoglycemia in the morning that she attributed vomiting to .  She is not using the current dose of humalog and    HOME DIABETES REGIMEN: Lantus 12 units daily - takes 14 units now  Humalog 8 units TIQAC Correction factor Humalog (BG -130/45)      Statin: no ACE-I/ARB: yes    METER DOWNLOAD SUMMARY: did not bring     DIABETIC COMPLICATIONS: Microvascular complications:   Denies: CKD Last eye exam: Completed 2 yrs   Macrovascular complications:  Denies: CAD, PVD, CVA   PAST HISTORY: Past Medical History:  Past Medical History:  Diagnosis Date   DKA (diabetic ketoacidosis) ([place])    Pyelonephritis 03/2017   Type 1 diabetes ([place])     diagnosed age 55yo    Past Surgical History:  Past Surgical History:  Procedure Laterality Date   EYE MUSCLE SURGERY  approx 2010   bilat eye surgury, left exotropia worse;Dr Annamaria Boots, opthomology    Social History:  reports that she has never smoked. She has been exposed to tobacco smoke. She has never used smokeless tobacco. She reports current drug use. Drug: Marijuana. She reports that she does not drink alcohol. Family History:  Family History  Problem Relation Age of Onset   Diabetes Mother        type 2   Hypertension Father    Kidney disease Sister        had kidney transplant.   Diabetes Sister        type 1   Stroke Maternal Grandmother    Diabetes Maternal Grandmother    COPD Maternal Grandfather    Alcohol abuse Maternal Grandfather    Arthritis Paternal Grandmother      HOME MEDICATIONS: Allergies as of 03/17/2022       Reactions   Peanut Oil Other (See Comments)   Reaction to peanuts - gums feel numb and tingling   Other Swelling   Pecans caused throat swelling and weakness        Medication List        Accurate as of March 17, 2022  9:22 AM. If you have any questions, ask your nurse or doctor.          STOP taking these medications    Dexcom G7 Sensor Misc Stopped by: Dorita Sciara, MD       TAKE these medications    blood glucose meter kit and supplies Kit Dispense based on patient and insurance preference. Use up to four times daily as directed. (FOR ICD-9 250.00, 250.01). Check sugars before meals   furosemide 20 MG tablet Commonly known as: LASIX Take 1 tablet (20 mg total) by mouth daily. What changed:  when to take this reasons to take this   insulin lispro 100 UNIT/ML KwikPen Commonly known as: HUMALOG Max daily 40 units   Insulin Pen Needle 31G X 5 MM Misc 1 Device by Does not apply route in the morning, at noon, in the evening, and at bedtime.   Lantus SoloStar 100 UNIT/ML Solostar Pen Generic drug: insulin glargine Inject 12  Units into the skin at bedtime.   losartan 25 MG tablet Commonly known as: COZAAR Take 1 tablet (25 mg total) by mouth daily.   metoprolol succinate 50 MG 24 hr tablet Commonly known as: TOPROL-XL Take 1 tablet (50 mg total) by mouth daily. Take with or immediately following a meal.   NON FORMULARY Take 1 tablet by mouth in the morning and at bedtime. Nutracouticals multi mineral         ALLERGIES: Allergies  Allergen Reactions   Peanut Oil Other (See Comments)    Reaction to peanuts - gums feel numb and tingling   Other Swelling    Pecans caused throat swelling and weakness     REVIEW OF SYSTEMS: A comprehensive ROS was conducted with the patient and is negative except as per HPI    OBJECTIVE:   VITAL SIGNS: BP 132/80 (BP Location: Left Arm, Patient Position: Sitting, Cuff Size: Small)   Pulse 68   Ht 5' 8"$  (1.727 m)   Wt 190 lb (86.2 kg)   SpO2 99%   BMI 28.89 kg/m    PHYSICAL EXAM:  General: Pt appears well and is in NAD  Lungs: Clear with good BS bilat   Heart: RRR   Abdomen:  soft, nontender  Extremities:  Lower extremities - no pretibial edema  Neuro: MS is good with appropriate affect, pt is alert and Ox3    DM foot exam: 03/17/2022  The skin of the feet is intact without sores or ulcerations. The pedal pulses are 2+ on right and 2+ on left. The sensation is intact to a screening 5.07, 10 gram monofilament bilaterally   DATA REVIEWED:  Lab Results  Component Value Date   HGBA1C 8.1 (A) 03/17/2022   HGBA1C 8.6 (A) 12/31/2021   HGBA1C 11.3 (A) 10/01/2021   Lab Results  Component Value Date   MICROALBUR 0.8 12/01/2010   LDLCALC 92 12/01/2010   CREATININE 1.09 10/13/2021   Lab Results  Component Value Date   MICRALBCREAT 6,744 (H) 08/31/2021    Lab Results  Component Value Date   CHOL 163 12/01/2010   HDL 57.70 12/01/2010   LDLCALC 92 12/01/2010   LDLDIRECT 142.4 04/25/2008   TRIG 68.0 12/01/2010   CHOLHDL 3 12/01/2010          In office BG 151 mg/dL   ASSESSMENT / PLAN / RECOMMENDATIONS:   1) Type 1 Diabetes Mellitus, Poorly controlled, Without complications - Most recent A1c of 8.1 %. Goal A1c < 7.0 %.    -  A1c trended down from 11.3% to 8.1% but continues to be above goal - She did not like the way Dexcom made her fell so she stopped it, no glucose data today  - She is not using the humalog correction scale that was previously provided, she continues to guesstimate on  her dosing  - I have recommended carb counting, a referral has been placed   MEDICATIONS: Continue Lantus 14 units daily  Take Humalog 8-10 units TIDQAC Start CF: Humalog (BG-130/45)  EDUCATION / INSTRUCTIONS: BG monitoring instructions: Patient is instructed to check her blood sugars 3 times a day, before meals . Call [facility] if: BG persistently < 70  I reviewed the Rule of 15 for the treatment of hypoglycemia in detail with the patient. Literature supplied.   2) Diabetic complications:  Eye: Does not have known diabetic retinopathy.  Neuro/ Feet: Does not have known diabetic peripheral neuropathy. Renal: Patient does not have known baseline CKD. She is  on an ACEI/ARB at present.         Signed electronically by: Mack Guise, MD  [facility] End[facility]in[facility]  [facility] [address] [place], [place] [address] Phone: [phone] FAX: [phone]   CC: Michela Pitcher, NP [place] Phone: [phone]  Fax: [phone]    Return to Endocrinology clinic as below: Future Appointments  Date Time Provider [place]  04/26/2022  3:15 PM Clydell Hakim, RD [facility] [place] [facility]  07/01/2022  4:00 PM Michela Pitcher, NP [facility]  09/15/2022  3:00 PM Henlee Donovan, Melanie Crazier, MD [facility] None

## 2022-03-17 NOTE — Patient Instructions (Addendum)
Continue  Lantus to 14 units daily  Take  Humalog 8 units with each meal  Humalog correctional insulin: ADD extra units on insulin to your meal-time Humalog dose if your blood sugars are higher than 175. Use the scale below to help guide you:   Blood sugar before meal Number of units to inject  Less than 175 0 unit  176 -  220 1 units  221 -  265 2 units  266 -  310 3 units  311 -  355 4 units  356 -  400 5 units  401 -  445 6 units     HOW TO TREAT LOW BLOOD SUGARS (Blood sugar LESS THAN 70 MG/DL) Please follow the RULE OF 15 for the treatment of hypoglycemia treatment (when your (blood sugars are less than 70 mg/dL)   STEP 1: Take 15 grams of carbohydrates when your blood sugar is low, which includes:  3-4 GLUCOSE TABS  OR 3-4 OZ OF JUICE OR REGULAR SODA OR ONE TUBE OF GLUCOSE GEL    STEP 2: RECHECK blood sugar in 15 MINUTES STEP 3: If your blood sugar is still low at the 15 minute recheck --> then, go back to STEP 1 and treat AGAIN with another 15 grams of carbohydrates.

## 2022-03-18 MED ORDER — FUROSEMIDE 20 MG PO TABS: 20.0000 mg | ORAL_TABLET | Freq: Every day | ORAL | 0 refills | Status: DC | PRN

## 2022-03-31 ENCOUNTER — Ambulatory Visit: Payer: BC Managed Care – PPO | Admitting: Internal Medicine

## 2022-04-26 ENCOUNTER — Encounter: Payer: Managed Care, Other (non HMO) | Attending: Nurse Practitioner | Admitting: Dietician

## 2022-04-26 VITALS — Ht 68.0 in

## 2022-04-26 DIAGNOSIS — Z713 Dietary counseling and surveillance: Secondary | ICD-10-CM | POA: Diagnosis not present

## 2022-04-26 DIAGNOSIS — E109 Type 1 diabetes mellitus without complications: Secondary | ICD-10-CM

## 2022-04-26 DIAGNOSIS — E1065 Type 1 diabetes mellitus with hyperglycemia: Secondary | ICD-10-CM | POA: Insufficient documentation

## 2022-04-26 DIAGNOSIS — Z6828 Body mass index (BMI) 28.0-28.9, adult: Secondary | ICD-10-CM | POA: Diagnosis not present

## 2022-04-26 NOTE — Progress Notes (Signed)
Diabetes Self-Management Education  Visit Type: First/Initial  Appt. Start Time: 1530 Appt. End Time: [id number]  04/26/2022  [name], identified by name and date of birth, is a 27 y.o. female with a diagnosis of Diabetes: Type 1.   ASSESSMENT Patient is here today with her boyfriend.  She would like to learn more about carbohydrate counting, insulin pumps, and is currently not using a CGM. Taught about pump options and emailed patient websites to better help in this decision. Requested CGM sensor from MD (Dexcom G7).  History includes:  Type 1 diabetes (age 40) Medications include:  [place] 12 units q HS, Humalog per sliding scale (8-10 units before meals) , lasix, herbal supplement  Correction factor:  1 unit for every 45 points above 130.  Patient lives with her mother.  They share shopping and cooking. 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 Patient has a sister that had diabetes but had a kidney pancrease transplant.  Height 5\' 8"  (1.727 m). Body mass index is 28.89 kg/m.   Diabetes Self-Management Education - 04/26/22 1555       Visit Information   Visit Type First/Initial      Initial Visit   Diabetes Type Type 1    Date Diagnosed 2003    Are you currently following a meal plan? No    Are you taking your medications as prescribed? Yes      Health Coping   How would you rate your overall health? Fair      Psychosocial Assessment   Patient Belief/Attitude about Diabetes Motivated to manage diabetes    What is the hardest part about your diabetes right now, causing you the most concern, or is the most worrisome to you about your diabetes?   Being active;Making healty food and beverage choices    Self-care barriers None    Self-management support None    Other persons present Patient;Spouse/SO    Patient Concerns Nutrition/Meal planning;Glycemic Control;Monitoring    Special Needs None    Preferred Learning Style No preference indicated     Learning Readiness Ready    How often do you need to have someone help you when you read instructions, pamphlets, or other written materials from your doctor or pharmacy? 1 - Never    What is the last grade level you completed in school? 12      Pre-Education Assessment   Patient understands the diabetes disease and treatment process. Needs Review    Patient understands incorporating nutritional management into lifestyle. Needs Review    Patient undertands incorporating physical activity into lifestyle. Needs Review    Patient understands using medications safely. Needs Review    Patient understands monitoring blood glucose, interpreting and using results Needs Review    Patient understands prevention, detection, and treatment of acute complications. Needs Review    Patient understands prevention, detection, and treatment of chronic complications. Needs Review    Patient understands how to develop strategies to address psychosocial issues. Needs Review    Patient understands how to develop strategies to promote health/change behavior. Needs Review      Complications   Last HgB A1C per patient/outside source 8.1 %   03/17/2022 decreased from 8.6% 12/31/2021   Fasting Blood glucose range (mg/dL) 70-129;130-179   115-170   Number of hypoglycemic episodes per month 0    Number of hyperglycemic episodes ( >200mg /dL): Weekly    Can you tell when your blood sugar is high? Yes    What do  you do if your blood sugar is high? takes insulin and drinks water    Have you had a dilated eye exam in the past 12 months? No    Have you had a dental exam in the past 12 months? No    Are you checking your feet? Yes    How many days per week are you checking your feet? 4      Dietary Intake   Breakfast fries from [employer] but usually biscuit, fries and diet Mt. Dew    Snack (morning) occasional orange or NABS    Lunch skips at times OR part of breakfast    Snack (afternoon) none    Dinner fried chicken  and cabbage OR Jambalaya    Snack (evening) none    Beverage(s) water, Diet Mt. Dew, occasional juice, unsweetened tea, green tea      Activity / Exercise   Activity / Exercise Type Light (walking / raking leaves)   walks outside or in the gym   How many days per week do you exercise? 3    How many minutes per day do you exercise? 30    Total minutes per week of exercise 90      Patient Education   Previous Diabetes Education Yes (please comment)   years ago   Disease Pathophysiology Factors that contribute to the development of diabetes;Definition of diabetes, type 1 and 2, and the diagnosis of diabetes;Explored patient's options for treatment of their diabetes   insulin pump reviewed   Healthy Eating Food label reading, portion sizes and measuring food.;Carbohydrate counting    Being Active Role of exercise on diabetes management, blood pressure control and cardiac health.    Medications Reviewed patients medication for diabetes, action, purpose, timing of dose and side effects.    Monitoring Other (comment);Identified appropriate SMBG and/or A1C goals.;Daily foot exams   CGM options   Acute complications Taught prevention, symptoms, and  treatment of hypoglycemia - the 15 rule.;Discussed and identified patients' prevention, symptoms, and treatment of hyperglycemia.    Chronic complications Relationship between chronic complications and blood glucose control    Diabetes Stress and Support Identified and addressed patients feelings and concerns about diabetes;Worked with patient to identify barriers to care and solutions      Individualized Goals (developed by patient)   Nutrition Carb counting    Medications take my medication as prescribed    Monitoring  Consistenly use CGM    Reducing Risk examine blood glucose patterns      Post-Education Assessment   Patient understands the diabetes disease and treatment process. Comprehends key points    Patient understands incorporating  nutritional management into lifestyle. Comprehends key points    Patient undertands incorporating physical activity into lifestyle. Comprehends key points    Patient understands monitoring blood glucose, interpreting and using results Comprehends key points    Patient understands prevention, detection, and treatment of acute complications. Comprehends key points    Patient understands prevention, detection, and treatment of chronic complications. Comprehends key points    Patient understands how to develop strategies to address psychosocial issues. Comprehends key points    Patient understands how to develop strategies to promote health/change behavior. Comprehends key points      Outcomes   Expected Outcomes Demonstrated interest in learning. Expect positive outcomes    Future DMSE 4-6 wks    Program Status Not Completed             Individualized Plan for Diabetes Self-Management Training:  Learning Objective:  Patient will have a greater understanding of diabetes self-management. Patient education plan is to attend individual and/or group sessions per assessed needs and concerns.   Plan:   There are no Patient Instructions on file for this visit.  Expected Outcomes:  Demonstrated interest in learning. Expect positive outcomes  Education material provided: ADA - How to Thrive: A Guide for Your Journey with Diabetes, Food label handouts, Meal plan card, and Diabetes Resources  If problems or questions, patient to contact team via:  Phone  Future DSME appointment: 4-6 wks

## 2022-04-27 ENCOUNTER — Other Ambulatory Visit: Payer: Self-pay | Admitting: Internal Medicine

## 2022-04-27 ENCOUNTER — Other Ambulatory Visit: Payer: Self-pay

## 2022-04-27 MED ORDER — DEXCOM G7 SENSOR MISC: 1.0000 | 3 refills | Status: DC

## 2022-04-27 MED ORDER — DEXCOM G7 SENSOR MISC: 3 refills | Status: AC

## 2022-04-28 IMAGING — US US EXTREM  UP VENOUS*L*
1 series · 13 of 24 positions shown · non-contrast
Comparison: None.

CLINICAL DATA: Left upper extremity pain

EXAM:
LEFT UPPER EXTREMITY VENOUS DUPLEX ULTRASOUND
TECHNIQUE: Gray-scale sonography with graded compression, as well as color
Doppler and duplex ultrasound were performed to evaluate the left
upper extremity deep venous system from the level of the subclavian
vein and including the jugular, axillary, basilic, radial, ulnar and
upper cephalic vein. Spectral Doppler was utilized to evaluate flow
at rest and with distal augmentation maneuvers.

[Series 1: us venous img upper uni left (dvt) · portal-venous · 13 of 31 slices shown]
[im 1/31]
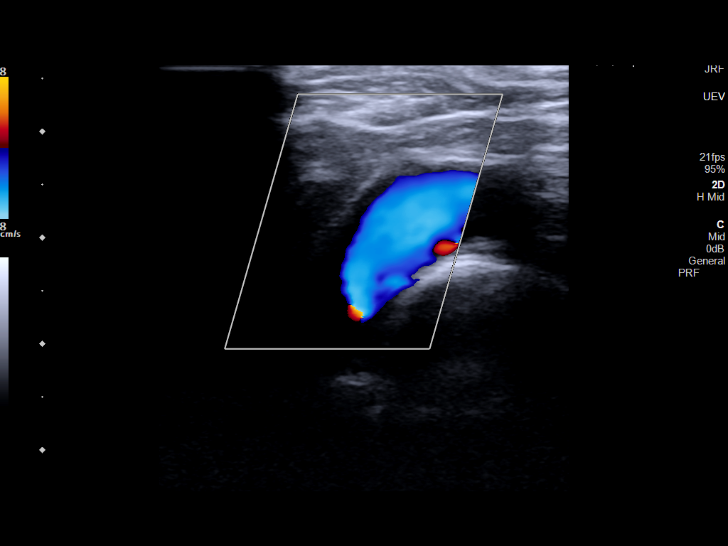
[im 3/31]
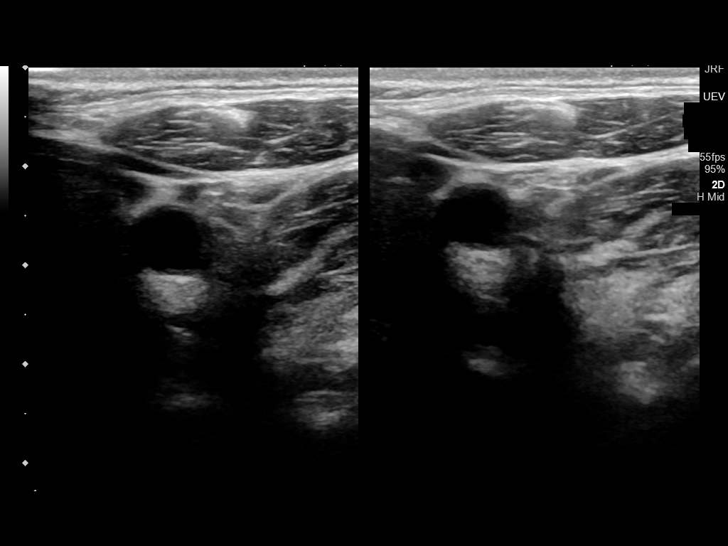
[im 6/31]
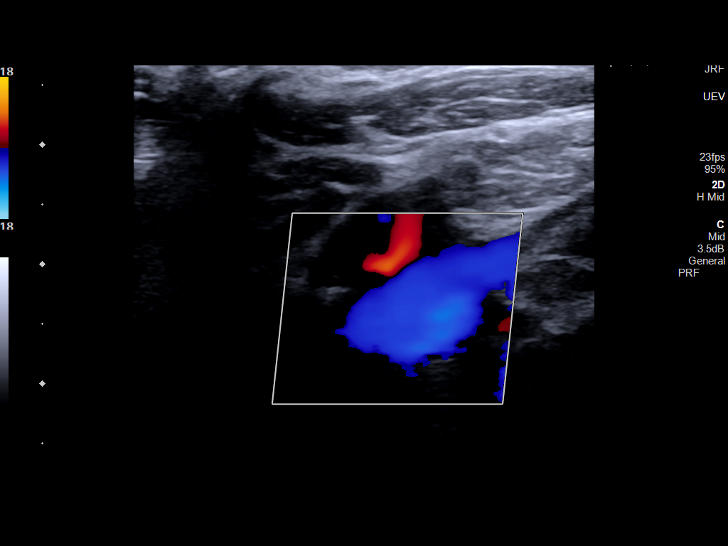
[im 8/31]
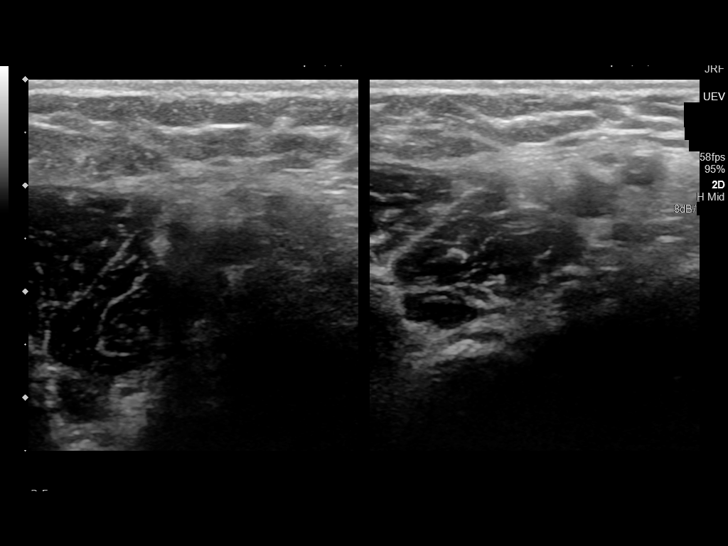
[im 11/31]
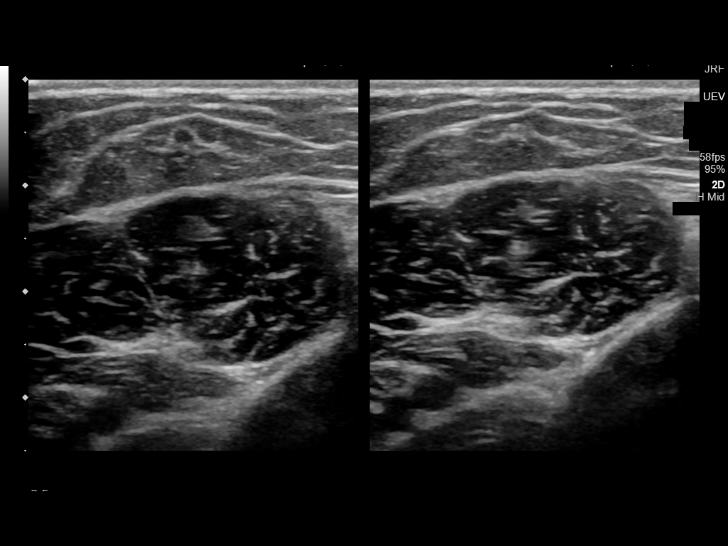
[im 14/31]
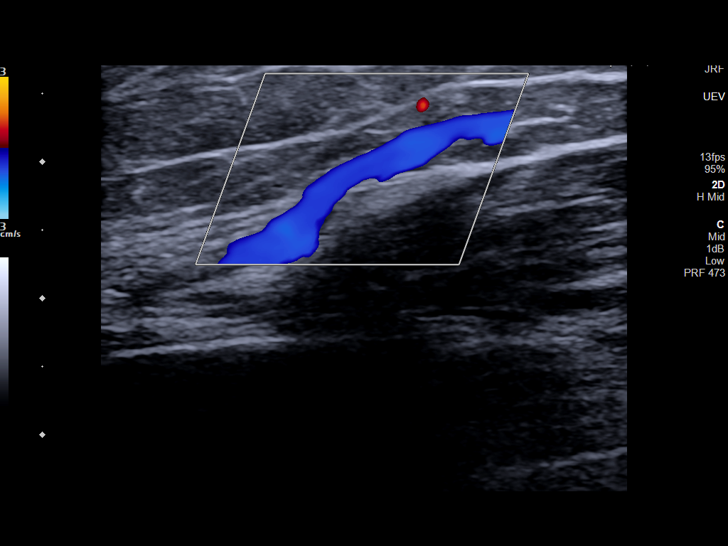
[im 16/31]
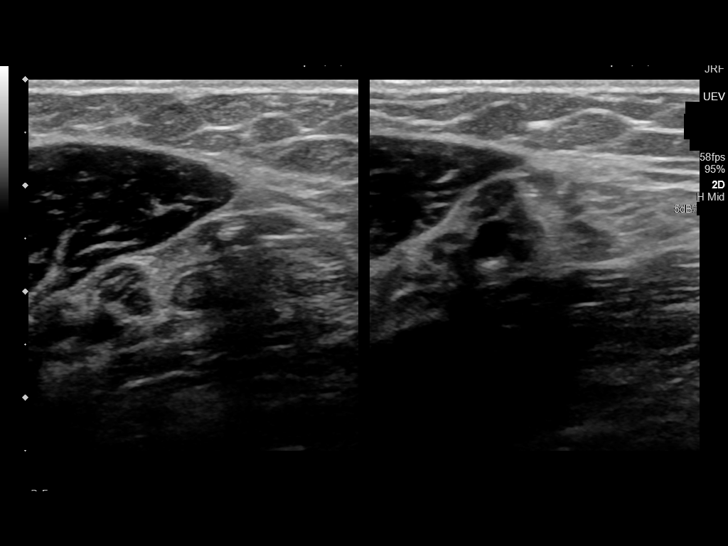
[im 17/31]
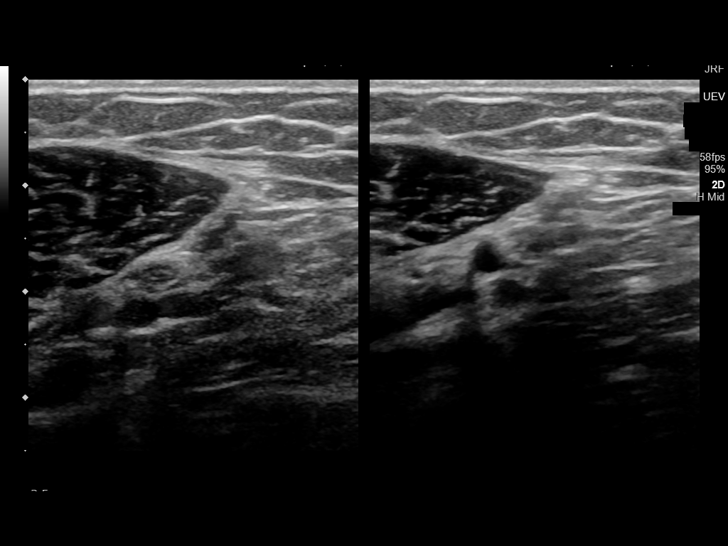
[im 20/31]
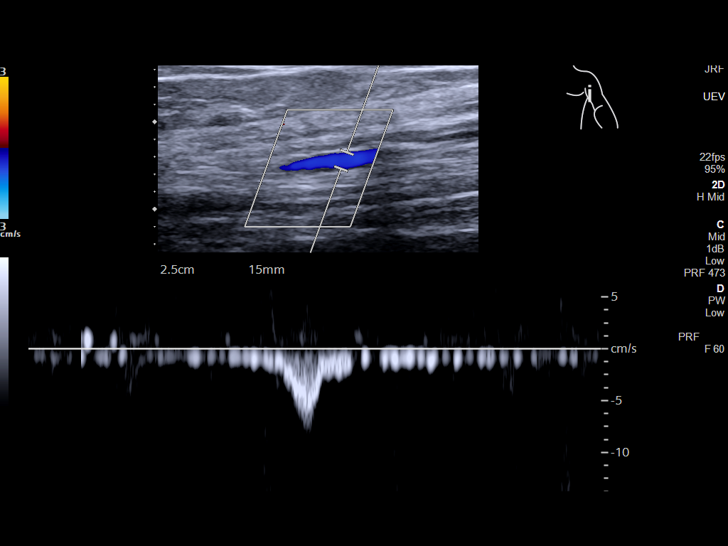
[im 23/31]
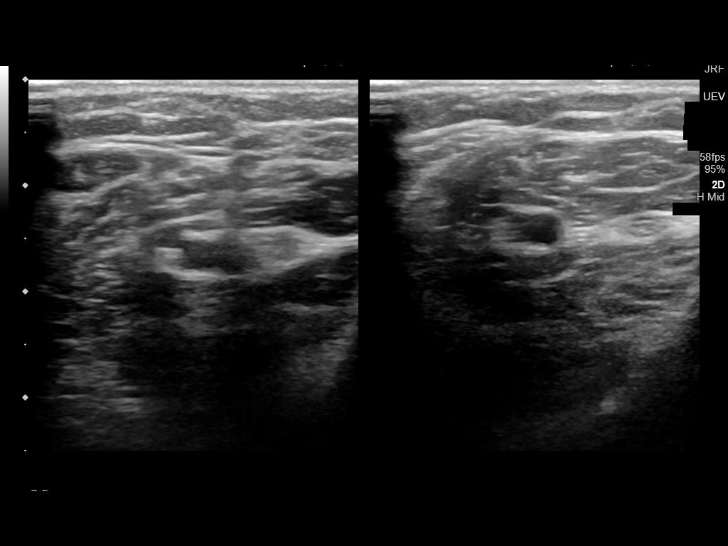
[im 25/31]
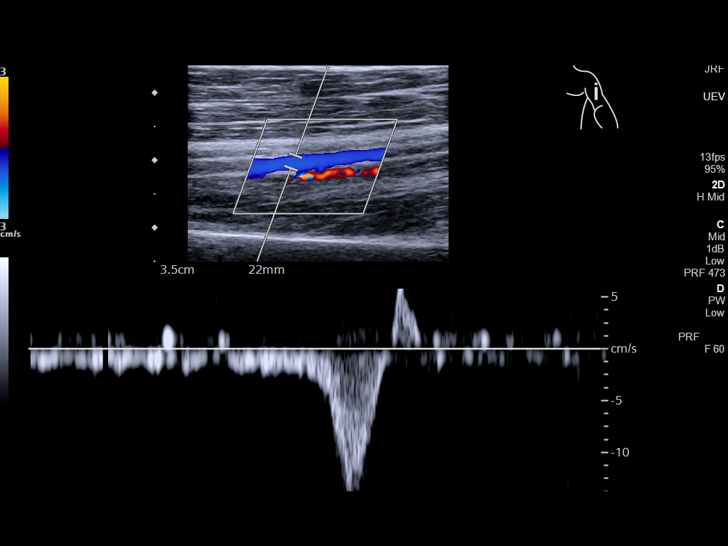
[im 28/31]
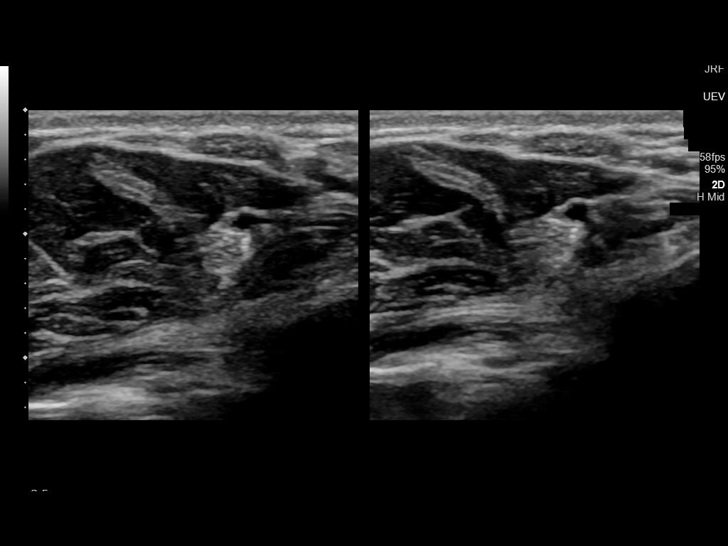
[im 31/31]
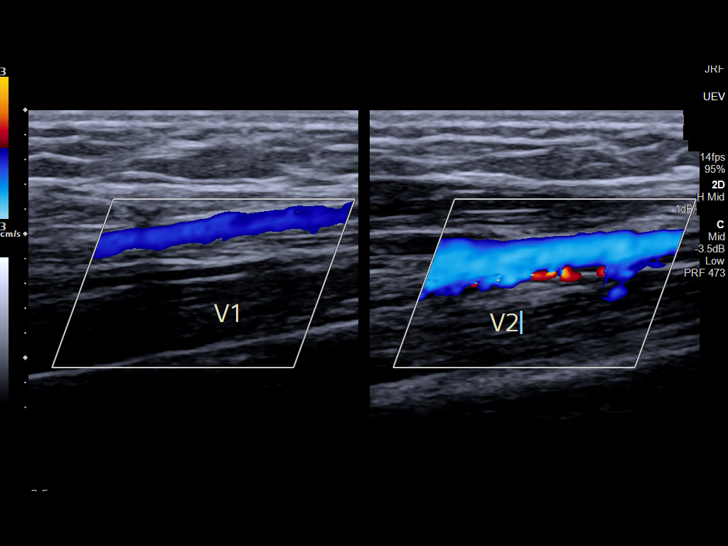

[13 of 24 positions shown; findings below may reference images not displayed]

FINDINGS: Contralateral Subclavian Vein: Respiratory phasicity is normal and
symmetric with the symptomatic side. No evidence of thrombus. Normal
compressibility.

Internal Jugular Vein: No evidence of thrombus. Normal
compressibility, respiratory phasicity and response to augmentation.

Subclavian Vein: No evidence of thrombus. Normal compressibility,
respiratory phasicity and response to augmentation.

Axillary Vein: No evidence of thrombus. Normal compressibility,
respiratory phasicity and response to augmentation.

Cephalic Vein: No evidence of thrombus. Normal compressibility,
respiratory phasicity and response to augmentation.

Basilic Vein: No evidence of thrombus. Normal compressibility,
respiratory phasicity and response to augmentation.

Brachial Veins: No evidence of thrombus. Normal compressibility,
respiratory phasicity and response to augmentation. Duplicated left
brachial vein with both limbs widely patent common anatomic variant.

Radial Veins: No evidence of thrombus. Normal compressibility,
respiratory phasicity and response to augmentation.

Ulnar Veins: No evidence of thrombus. Normal compressibility,
respiratory phasicity and response to augmentation.

Venous Reflux:  None visualized.

Other Findings: None visualized. No lesions seen by ultrasound at
site of pain in the left upper extremity in particular.
IMPRESSION: No evidence of deep venous thrombosis in the left upper extremity.
Right subclavian vein also patent.

## 2022-06-01 ENCOUNTER — Encounter: Payer: Self-pay | Admitting: Nurse Practitioner

## 2022-06-02 ENCOUNTER — Telehealth: Payer: Self-pay | Admitting: Nurse Practitioner

## 2022-06-02 NOTE — Telephone Encounter (Signed)
Patient dropped off document FMLA, to be filled out by provider. Patient requested to send it via Fax within ASAP. Document is located in providers tray at front office.Please advise at Mobile [phone] (mobile). Fax number is [phone]), patient requested a copy for their records.

## 2022-06-07 ENCOUNTER — Telehealth: Payer: Self-pay | Admitting: Nurse Practitioner

## 2022-06-07 NOTE — Telephone Encounter (Signed)
Got FMLA papers that she wants filled out due to nausea dealing with her diabetes. She is seen by endocrinology. Since they manage he diabetes they need to fill them out

## 2022-06-07 NOTE — Telephone Encounter (Signed)
Left message to return call to our office.  

## 2022-06-07 NOTE — Telephone Encounter (Signed)
Sent a my chart message as well.

## 2022-06-07 NOTE — Telephone Encounter (Signed)
See earlier staff message. Patient has been called and my chart message sent to patient

## 2022-06-10 ENCOUNTER — Ambulatory Visit: Payer: Managed Care, Other (non HMO) | Admitting: Dietician

## 2022-06-10 ENCOUNTER — Encounter: Payer: Self-pay | Admitting: Internal Medicine

## 2022-06-14 ENCOUNTER — Ambulatory Visit: Payer: Managed Care, Other (non HMO) | Admitting: Internal Medicine

## 2022-06-14 NOTE — Progress Notes (Deleted)
Name: Mandy Patel  MRN/ DOB: [id number], [birth year]   Age/ Sex: 27 y.o., female    PCP: Eden Emms, NP   Reason for Endocrinology Evaluation: Type 1 Diabetes Mellitus     Date of Initial Endocrinology Visit: 10/28/2021    PATIENT IDENTIFIER: Mandy Patel is a 27 y.o. female with a past medical history of T1DM. The patient presented for initial endocrinology clinic visit on 10/28/2021 for consultative assistance with her diabetes management.    HPI: Mandy Patel was    Diagnosed with DM at age 42          Hemoglobin A1c has ranged from 11.3% in 2023, peaking at 13.5% in 2012.  On her initial visit to our clinic she had an A1c of 11.3%, we adjusted MDI regimen and a prescription for Dexcom was provided    She was trained on Dexcom use by our CDE 04/2022    SUBJECTIVE:   During the last visit (03/17/2022): A1c 8.1%     Today (06/14/22): Mandy Patel is here for a follow up on diabetes management.  She tried the dexcom but did not like the way it made her feel. She checks her blood sugars 2 times daily. The patient did not bring her meter today   A couple weeks ago she had viral gastritis with diarrhea but this has resolved  She has self increased Lantus from 12 units to 14 units due to persistent hypoglycemia in the morning that she attributed vomiting to .  She is not using the current dose of humalog and    HOME DIABETES REGIMEN: Lantus 14 units daily Humalog 8-10 units TIQAC Correction factor Humalog (BG -130/45)      Statin: no ACE-I/ARB: yes    METER DOWNLOAD SUMMARY: did not bring     DIABETIC COMPLICATIONS: Microvascular complications:   Denies: CKD Last eye exam: Completed 2 yrs   Macrovascular complications:  Denies: CAD, PVD, CVA   PAST HISTORY: Past Medical History:  Past Medical History:  Diagnosis Date   DKA (diabetic ketoacidosis) (HCC)    Pyelonephritis 03/2017   Type 1 diabetes (HCC)     diagnosed age 65yo   Past  Surgical History:  Past Surgical History:  Procedure Laterality Date   EYE MUSCLE SURGERY  approx 2010   bilat eye surgury, left exotropia worse;Dr Maple Hudson, opthomology    Social History:  reports that she has never smoked. She has been exposed to tobacco smoke. She has never used smokeless tobacco. She reports current drug use. Drug: Marijuana. She reports that she does not drink alcohol. Family History:  Family History  Problem Relation Age of Onset   Diabetes Mother        type 2   Hypertension Father    Kidney disease Sister        had kidney transplant.   Diabetes Sister        type 1   Stroke Maternal Grandmother    Diabetes Maternal Grandmother    COPD Maternal Grandfather    Alcohol abuse Maternal Grandfather    Arthritis Paternal Grandmother      HOME MEDICATIONS: Allergies as of 06/14/2022       Reactions   Peanut Oil Other (See Comments)   Reaction to peanuts - gums feel numb and tingling   Other Swelling   Pecans caused throat swelling and weakness        Medication List        Accurate as of Jun 14, 2022 12:58 PM. If you have any questions, ask your nurse or doctor.          blood glucose meter kit and supplies Kit Dispense based on patient and insurance preference. Use up to four times daily as directed. (FOR ICD-9 250.00, 250.01). Check sugars before meals   Dexcom G7 Sensor Misc Change sensors every 10 days   furosemide 20 MG tablet Commonly known as: LASIX Take 1 tablet (20 mg total) by mouth daily as needed.   insulin detemir 100 UNIT/ML injection Commonly known as: LEVEMIR Inject 12 Units into the skin daily.   insulin lispro 100 UNIT/ML KwikPen Commonly known as: HUMALOG Max daily 40 units   Insulin Pen Needle 31G X 5 MM Misc 1 Device by Does not apply route in the morning, at noon, in the evening, and at bedtime.   Lantus SoloStar 100 UNIT/ML Solostar Pen Generic drug: insulin glargine Inject 14 Units into the skin at bedtime.    losartan 25 MG tablet Commonly known as: COZAAR Take 1 tablet (25 mg total) by mouth daily.   metoprolol succinate 50 MG 24 hr tablet Commonly known as: TOPROL-XL Take 1 tablet (50 mg total) by mouth daily. Take with or immediately following a meal.   NON FORMULARY Take 1 tablet by mouth in the morning and at bedtime. Nutracouticals multi mineral         ALLERGIES: Allergies  Allergen Reactions   Peanut Oil Other (See Comments)    Reaction to peanuts - gums feel numb and tingling   Other Swelling    Pecans caused throat swelling and weakness     REVIEW OF SYSTEMS: A comprehensive ROS was conducted with the patient and is negative except as per HPI    OBJECTIVE:   VITAL SIGNS: There were no vitals taken for this visit.   PHYSICAL EXAM:  General: Pt appears well and is in NAD  Lungs: Clear with good BS bilat   Heart: RRR   Abdomen:  soft, nontender  Extremities:  Lower extremities - no pretibial edema  Neuro: MS is good with appropriate affect, pt is alert and Ox3    DM foot exam: 03/17/2022  The skin of the feet is intact without sores or ulcerations. The pedal pulses are 2+ on right and 2+ on left. The sensation is intact to a screening 5.07, 10 gram monofilament bilaterally   DATA REVIEWED:  Lab Results  Component Value Date   HGBA1C 8.1 (A) 03/17/2022   HGBA1C 8.6 (A) 12/31/2021   HGBA1C 11.3 (A) 10/01/2021   Lab Results  Component Value Date   MICROALBUR 0.8 12/01/2010   LDLCALC 92 12/01/2010   CREATININE 1.09 10/13/2021   Lab Results  Component Value Date   MICRALBCREAT 6,744 (H) 08/31/2021    Lab Results  Component Value Date   CHOL 163 12/01/2010   HDL 57.70 12/01/2010   LDLCALC 92 12/01/2010   LDLDIRECT 142.4 04/25/2008   TRIG 68.0 12/01/2010   CHOLHDL 3 12/01/2010         In office BG 151 mg/dL   ASSESSMENT / PLAN / RECOMMENDATIONS:   1) Type 1 Diabetes Mellitus, Poorly controlled, Without complications - Most recent A1c of  8.1 %. Goal A1c < 7.0 %.    - A1c trended down from 11.3% to 8.1% but continues to be above goal - She did not like the way Dexcom made her fell so she stopped it, no glucose data today  - She is not  using the humalog correction scale that was previously provided, she continues to guesstimate on  her dosing  - I have recommended carb counting, a referral has been placed   MEDICATIONS: Continue Lantus 14 units daily  Take Humalog 8-10 units TIDQAC Start CF: Humalog (BG-130/45)  EDUCATION / INSTRUCTIONS: BG monitoring instructions: Patient is instructed to check her blood sugars 3 times a day, before meals . Call [facility] if: BG persistently < 70  I reviewed the Rule of 15 for the treatment of hypoglycemia in detail with the patient. Literature supplied.   2) Diabetic complications:  Eye: Does not have known diabetic retinopathy.  Neuro/ Feet: Does not have known diabetic peripheral neuropathy. Renal: Patient does not have known baseline CKD. She is  on an ACEI/ARB at present.         Signed electronically by: Lyndle Herrlich, MD  [facility]  [facility] [address] [place], [place] [address] Phone: [phone] FAX: [phone]   CC: Eden Emms, NP [address] [place] [address]/ Phone: [phone]  Fax: [phone]    Return to Endocrinology clinic as below: Future Appointments  Date Time Provider Department Center  06/14/2022  3:00 PM Evangelynn Lochridge, Konrad Dolores, MD [facility] None  07/01/2022  4:00 PM Eden Emms, NP [facility]  09/15/2022  3:00 PM Dillard Pascal, Konrad Dolores, MD [facility] None

## 2022-06-24 ENCOUNTER — Encounter: Payer: Self-pay | Admitting: Nurse Practitioner

## 2022-06-24 NOTE — Telephone Encounter (Signed)
Ok to mail a letter 

## 2022-06-24 NOTE — Telephone Encounter (Signed)
Letter mailed

## 2022-06-24 NOTE — Telephone Encounter (Signed)
Called patient left message to call office. Looks like we have reached out to patient by phone and my chart and no response. Ok to mail letter that if she still has any questions to cal office and close note?

## 2022-07-01 ENCOUNTER — Encounter: Payer: Self-pay | Admitting: Nurse Practitioner

## 2022-07-01 ENCOUNTER — Ambulatory Visit (INDEPENDENT_AMBULATORY_CARE_PROVIDER_SITE_OTHER): Payer: Managed Care, Other (non HMO) | Admitting: Nurse Practitioner

## 2022-07-01 VITALS — BP 130/80 | HR 88 | Temp 97.5°F | Resp 16 | Ht 68.0 in | Wt 180.2 lb

## 2022-07-01 DIAGNOSIS — Z23 Encounter for immunization: Secondary | ICD-10-CM

## 2022-07-01 DIAGNOSIS — Z0000 Encounter for general adult medical examination without abnormal findings: Secondary | ICD-10-CM

## 2022-07-01 DIAGNOSIS — E1065 Type 1 diabetes mellitus with hyperglycemia: Secondary | ICD-10-CM | POA: Diagnosis not present

## 2022-07-01 DIAGNOSIS — R112 Nausea with vomiting, unspecified: Secondary | ICD-10-CM | POA: Diagnosis not present

## 2022-07-01 DIAGNOSIS — I152 Hypertension secondary to endocrine disorders: Secondary | ICD-10-CM | POA: Diagnosis not present

## 2022-07-01 DIAGNOSIS — E1159 Type 2 diabetes mellitus with other circulatory complications: Secondary | ICD-10-CM

## 2022-07-01 DIAGNOSIS — E663 Overweight: Secondary | ICD-10-CM | POA: Diagnosis not present

## 2022-07-01 MED ORDER — METOCLOPRAMIDE HCL 5 MG PO TABS
5.0000 mg | ORAL_TABLET | Freq: Three times a day (TID) | ORAL | 0 refills | Status: DC | PRN
Start: 2022-07-01 — End: 2022-08-31

## 2022-07-01 NOTE — Patient Instructions (Signed)
Nice to see you today I will be in touch with the labs once I have them Follow up with me in 1 year for your next physical, sooner if you need me  Let me know if the nausea medication helps

## 2022-07-01 NOTE — Assessment & Plan Note (Signed)
Discussed age-appropriate immunizations and screening exams.  Did review patient's personal, surgical, social, family histories.  Patient is up-to-date on all age-appropriate vaccines, query HPV.  Update tetanus today.  Patient is too young for CRC screening, breast cancer screening.  Patient is up-to-date on cervical cancer screening.  Patient was given information on preventative healthcare maintenance with anticipatory guidance for age range. Patient is overdue for an eye exam encouraged to get such

## 2022-07-01 NOTE — Assessment & Plan Note (Signed)
Unclear etiology.  Low suspicion for gastritis, gastric ulcer, GERD.  Could be related to diabetes but she is been followed by endocrinology.  Antiemetics help.  Will trial patient on Reglan 5 mg 3 times daily as needed see if this is beneficial if not can try patient on a PPI

## 2022-07-01 NOTE — Assessment & Plan Note (Signed)
Continue working on lifestyle modifications pending TSH and lipid panel.

## 2022-07-01 NOTE — Progress Notes (Signed)
Established Patient Office Visit  Subjective   Patient ID: Mandy Patel, female    DOB: [birth year]  Age: 27 y.o. MRN: [id number]  Chief Complaint  Patient presents with   Annual Exam    HPI  for complete physical and follow up of chronic conditions.   HTN: Patient currently maintained on metoprolol 50 mg daily, losartan 20 mg a day and furosemide 20 mg a day as needed. States that her monitor has stopped working.  DM1: Patient is currently followed by endocrinology and is on Lantus and Humalog insulin.  She is followed by Dr. Lonzo Cloud  Edema: History of the same managed on furosemide 20 mg daily as needed.  Immunizations: -Tetanus: Completed in unsure update today -Influenza: Out of season -Shingles: Too young -Pneumonia: Too young -COVID: Pfizer x 2 and boosters -HPV:?  Diet: Fair diet. States that in the am she will do something small like a biscuit. States that she normally does not do lunch but may snack. Dinner is a meat and side. water Exercise:  she will walk at work on her lunch break or after work around the apartment  Eye exam:  needs updating  Dental exam:  needs updating   Colonoscopy: Too young, currently average risk Lung Cancer Screening: N/A  Pap Smear: 10/26/2021, with negative HPV and ASCUS. Followed by [facility]  Mammogram: Too young, currently average risk  DEXA: Too young  Sleep: states that she goes to bed around 12-1128 and get up 630. Feels rested sometimes. States sometimes she naps. States that she does snore sometimes   Nausea: states that when she wakes up she feels really nauseous and on occasion she will vomiting. She will check her sugar and the highest was 250. She take her insulin and laid back down. Sometimes it helps sometimes it doesn't. States that she will stop eating 730-8 and sometimes it helps sometimes it doesn't. State that she has not smoked in approx a week. Last episode was three days ago. States Bm regular  for her and she does burp but her normal for her.  States that she has tried carbonated beverages that do not help . States that she has not tired to eat when it happens besides crackers and it can help some.  Her mother was given her Zofran in the past that seems to be beneficial.      Review of Systems  Constitutional:  Negative for chills and fever.  Respiratory:  Negative for shortness of breath.   Cardiovascular:  Negative for chest pain and leg swelling.  Gastrointestinal:  Negative for abdominal pain, blood in stool, constipation, diarrhea, nausea and vomiting.       Every other day   Genitourinary:  Negative for dysuria and hematuria.  Neurological:  Negative for tingling and headaches.  Psychiatric/Behavioral:  Negative for hallucinations and suicidal ideas.       Objective:     BP 130/80   Pulse 88   Temp (!) 97.5 F (36.4 C)   Resp 16   Ht 5\' 8"  (1.727 m)   Wt 180 lb 4 oz (81.8 kg)   LMP 06/05/2022 (Exact Date)   SpO2 99%   BMI 27.41 kg/m  BP Readings from Last 3 Encounters:  07/01/22 130/80  03/17/22 132/80  12/31/21 (!) 132/90   Wt Readings from Last 3 Encounters:  07/01/22 180 lb 4 oz (81.8 kg)  03/17/22 190 lb (86.2 kg)  12/31/21 192 lb (87.1 kg)  Physical Exam Vitals and nursing note reviewed.  Constitutional:      Appearance: Normal appearance.  HENT:     Right Ear: Tympanic membrane, ear canal and external ear normal.     Left Ear: Tympanic membrane, ear canal and external ear normal.     Mouth/Throat:     Mouth: Mucous membranes are moist.     Pharynx: Oropharynx is clear.  Eyes:     Extraocular Movements: Extraocular movements intact.     Pupils: Pupils are equal, round, and reactive to light.  Cardiovascular:     Rate and Rhythm: Normal rate and regular rhythm.     Pulses: Normal pulses.     Heart sounds: Normal heart sounds.  Pulmonary:     Effort: Pulmonary effort is normal.     Breath sounds: Normal breath sounds.  Abdominal:      General: Bowel sounds are normal. There is no distension.     Palpations: There is no mass.     Tenderness: There is no abdominal tenderness.     Hernia: No hernia is present.  Musculoskeletal:     Right lower leg: No edema (trace edema).     Left lower leg: No edema (trace edema).  Lymphadenopathy:     Cervical: No cervical adenopathy.  Skin:    General: Skin is warm.  Neurological:     General: No focal deficit present.     Mental Status: She is alert.     Deep Tendon Reflexes:     Reflex Scores:      Bicep reflexes are 2+ on the right side and 2+ on the left side.      Patellar reflexes are 2+ on the right side and 2+ on the left side.    Comments: Bilateral upper and lower extremity strength 5/5  Psychiatric:        Mood and Affect: Mood normal.        Behavior: Behavior normal.        Thought Content: Thought content normal.        Judgment: Judgment normal.      No results found for any visits on 07/01/22.    The ASCVD Risk score (Arnett DK, et al., 2019) failed to calculate for the following reasons:   The 2019 ASCVD risk score is only valid for ages 37 to 29    Assessment & Plan:   Problem List Items Addressed This Visit       Cardiovascular and Mediastinum   Hypertension associated with diabetes [facility])    Patient currently maintained on metoprolol 50 mg along with losartan 25 mg blood pressure within normal limits continue taking medication as prescribed      Relevant Orders   CBC   Comprehensive metabolic panel   TSH     Digestive   Nausea and vomiting    Unclear etiology.  Low suspicion for gastritis, gastric ulcer, GERD.  Could be related to diabetes but she is been followed by endocrinology.  Antiemetics help.  Will trial patient on Reglan 5 mg 3 times daily as needed see if this is beneficial if not can try patient on a PPI      Relevant Medications   metoCLOPramide (REGLAN) 5 MG tablet   Other Relevant Orders   Lipase     Endocrine    Type 1 diabetes mellitus with hyperglycemia (HCC)    Patient currently maintained on continuous glucose monitor, long-acting insulin, short acting insulin.  She is followed by  endocrinology.  Continue taking medication as prescribed follow-up with specialist as recommended      Relevant Orders   Lipid panel     Other   Preventative health care - Primary    Discussed age-appropriate immunizations and screening exams.  Did review patient's personal, surgical, social, family histories.  Patient is up-to-date on all age-appropriate vaccines, query HPV.  Update tetanus today.  Patient is too young for CRC screening, breast cancer screening.  Patient is up-to-date on cervical cancer screening.  Patient was given information on preventative healthcare maintenance with anticipatory guidance for age range. Patient is overdue for an eye exam encouraged to get such      Relevant Orders   CBC   Comprehensive metabolic panel   TSH   Overweight    Continue working on lifestyle modifications pending TSH and lipid panel.      Relevant Orders   Lipid panel   Other Visit Diagnoses     Need for tetanus booster       Relevant Orders   Tdap vaccine greater than or equal to 7yo IM       Return in about 1 year (around 07/01/2023) for CPE and Labs.    Audria Nine, NP

## 2022-07-01 NOTE — Assessment & Plan Note (Signed)
Patient currently maintained on metoprolol 50 mg along with losartan 25 mg blood pressure within normal limits continue taking medication as prescribed

## 2022-07-01 NOTE — Assessment & Plan Note (Signed)
Patient currently maintained on continuous glucose monitor, long-acting insulin, short acting insulin.  She is followed by endocrinology.  Continue taking medication as prescribed follow-up with specialist as recommended

## 2022-07-02 ENCOUNTER — Other Ambulatory Visit: Payer: Self-pay | Admitting: Nurse Practitioner

## 2022-07-02 DIAGNOSIS — E875 Hyperkalemia: Secondary | ICD-10-CM

## 2022-07-02 DIAGNOSIS — N289 Disorder of kidney and ureter, unspecified: Secondary | ICD-10-CM

## 2022-07-02 LAB — CBC
Hematocrit: 33.6 % — ABNORMAL LOW (ref 34.0–46.6)
Hemoglobin: 11.2 g/dL (ref 11.1–15.9)
MCH: 30.7 pg (ref 26.6–33.0)
MCHC: 33.3 g/dL (ref 31.5–35.7)
MCV: 92 fL (ref 79–97)
Platelets: 316 10*3/uL (ref 150–450)
RBC: 3.65 x10E6/uL — ABNORMAL LOW (ref 3.77–5.28)
RDW: 12 % (ref 11.7–15.4)
WBC: 12.3 10*3/uL — ABNORMAL HIGH (ref 3.4–10.8)

## 2022-07-02 LAB — COMPREHENSIVE METABOLIC PANEL
ALT: 14 IU/L (ref 0–32)
AST: 9 IU/L (ref 0–40)
Albumin/Globulin Ratio: 1.3 (ref 1.2–2.2)
Albumin: 3.5 g/dL — ABNORMAL LOW (ref 4.0–5.0)
Alkaline Phosphatase: 101 IU/L (ref 44–121)
BUN/Creatinine Ratio: 13 (ref 9–23)
BUN: 20 mg/dL (ref 6–20)
Bilirubin Total: <0.2 mg/dL (ref 0.0–1.2)
CO2: 17 mmol/L — ABNORMAL LOW (ref 20–29)
Calcium: 9.1 mg/dL (ref 8.7–10.2)
Chloride: 104 mmol/L (ref 96–106)
Creatinine, Ser: 1.59 mg/dL — ABNORMAL HIGH (ref 0.57–1.00)
Globulin, Total: 2.8 g/dL (ref 1.5–4.5)
Glucose: 143 mg/dL — ABNORMAL HIGH (ref 70–99)
Potassium: 5.6 mmol/L — ABNORMAL HIGH (ref 3.5–5.2)
Sodium: 134 mmol/L (ref 134–144)
Total Protein: 6.3 g/dL (ref 6.0–8.5)
eGFR: 46 mL/min/{1.73_m2} — ABNORMAL LOW (ref >59)

## 2022-07-02 LAB — LIPID PANEL
Chol/HDL Ratio: 3.9 ratio (ref 0.0–4.4)
Cholesterol, Total: 253 mg/dL — ABNORMAL HIGH (ref 100–199)
HDL: 65 mg/dL (ref >39)
LDL Chol Calc (NIH): 146 mg/dL — ABNORMAL HIGH (ref 0–99)
Triglycerides: 235 mg/dL — ABNORMAL HIGH (ref 0–149)
VLDL Cholesterol Cal: 42 mg/dL — ABNORMAL HIGH (ref 5–40)

## 2022-07-02 LAB — TSH: TSH: 2.81 u[IU]/mL (ref 0.450–4.500)

## 2022-07-02 LAB — LIPASE: Lipase: 19 U/L (ref 14–72)

## 2022-07-15 ENCOUNTER — Other Ambulatory Visit: Payer: Self-pay | Admitting: Nurse Practitioner

## 2022-07-15 ENCOUNTER — Other Ambulatory Visit: Payer: Managed Care, Other (non HMO)

## 2022-07-15 DIAGNOSIS — R600 Localized edema: Secondary | ICD-10-CM

## 2022-07-15 DIAGNOSIS — I152 Hypertension secondary to endocrine disorders: Secondary | ICD-10-CM

## 2022-07-20 ENCOUNTER — Other Ambulatory Visit (INDEPENDENT_AMBULATORY_CARE_PROVIDER_SITE_OTHER): Payer: Managed Care, Other (non HMO)

## 2022-07-20 DIAGNOSIS — E875 Hyperkalemia: Secondary | ICD-10-CM | POA: Diagnosis not present

## 2022-07-20 DIAGNOSIS — N289 Disorder of kidney and ureter, unspecified: Secondary | ICD-10-CM

## 2022-07-21 LAB — BASIC METABOLIC PANEL
BUN/Creatinine Ratio: 17 (ref 9–23)
BUN: 25 mg/dL — ABNORMAL HIGH (ref 6–20)
CO2: 16 mmol/L — ABNORMAL LOW (ref 20–29)
Calcium: 8.4 mg/dL — ABNORMAL LOW (ref 8.7–10.2)
Chloride: 106 mmol/L (ref 96–106)
Creatinine, Ser: 1.49 mg/dL — ABNORMAL HIGH (ref 0.57–1.00)
Glucose: 182 mg/dL — ABNORMAL HIGH (ref 70–99)
Potassium: 4.9 mmol/L (ref 3.5–5.2)
Sodium: 133 mmol/L — ABNORMAL LOW (ref 134–144)
eGFR: 49 mL/min/{1.73_m2} — ABNORMAL LOW (ref >59)

## 2022-07-22 ENCOUNTER — Encounter: Payer: Self-pay | Admitting: Nurse Practitioner

## 2022-07-22 ENCOUNTER — Ambulatory Visit
Admission: RE | Admit: 2022-07-22 | Discharge: 2022-07-22 | Disposition: A | Discharge status: Home / Self Care | Payer: Managed Care, Other (non HMO) | Source: Ambulatory Visit | Attending: Nurse Practitioner | Admitting: Nurse Practitioner

## 2022-07-22 ENCOUNTER — Other Ambulatory Visit: Payer: Self-pay | Admitting: Nurse Practitioner

## 2022-07-22 DIAGNOSIS — N289 Disorder of kidney and ureter, unspecified: Secondary | ICD-10-CM

## 2022-07-23 ENCOUNTER — Telehealth: Payer: Self-pay | Admitting: Nurse Practitioner

## 2022-07-23 NOTE — Telephone Encounter (Signed)
Patient called in to follow up on U/S of kidneys. She stated that she received something on MyChart about her results but didn't know what it meant. Thank you!

## 2022-07-23 NOTE — Telephone Encounter (Signed)
Called and informed pt of this information. 

## 2022-07-23 NOTE — Telephone Encounter (Signed)
The ultrasound of her kidneys look good. I want her to try and not use the lasix and avoid NSAIDs. Cut back on sodas and drink plenty of water. Recheck kidneys with a lab draw in 1 month

## 2022-08-09 ENCOUNTER — Other Ambulatory Visit: Payer: Self-pay | Admitting: Nurse Practitioner

## 2022-08-09 DIAGNOSIS — N289 Disorder of kidney and ureter, unspecified: Secondary | ICD-10-CM

## 2022-08-24 ENCOUNTER — Other Ambulatory Visit (INDEPENDENT_AMBULATORY_CARE_PROVIDER_SITE_OTHER): Payer: Managed Care, Other (non HMO)

## 2022-08-24 DIAGNOSIS — N289 Disorder of kidney and ureter, unspecified: Secondary | ICD-10-CM

## 2022-08-24 NOTE — Addendum Note (Signed)
Addended by: Alvina Chou on: 08/24/2022 07:48 AM   Modules accepted: Orders

## 2022-08-25 LAB — BASIC METABOLIC PANEL
BUN/Creatinine Ratio: 22 (ref 9–23)
BUN: 33 mg/dL — ABNORMAL HIGH (ref 6–20)
CO2: 13 mmol/L — ABNORMAL LOW (ref 20–29)
Calcium: 7.9 mg/dL — ABNORMAL LOW (ref 8.7–10.2)
Chloride: 110 mmol/L — ABNORMAL HIGH (ref 96–106)
Creatinine, Ser: 1.51 mg/dL — ABNORMAL HIGH (ref 0.57–1.00)
Glucose: 271 mg/dL — ABNORMAL HIGH (ref 70–99)
Potassium: 5.7 mmol/L — ABNORMAL HIGH (ref 3.5–5.2)
Sodium: 136 mmol/L (ref 134–144)
eGFR: 49 mL/min/{1.73_m2} — ABNORMAL LOW (ref >59)

## 2022-08-26 ENCOUNTER — Encounter: Payer: Self-pay | Admitting: Nurse Practitioner

## 2022-08-26 ENCOUNTER — Other Ambulatory Visit: Payer: Self-pay | Admitting: Nurse Practitioner

## 2022-08-26 DIAGNOSIS — R112 Nausea with vomiting, unspecified: Secondary | ICD-10-CM

## 2022-08-26 DIAGNOSIS — E875 Hyperkalemia: Secondary | ICD-10-CM

## 2022-08-26 DIAGNOSIS — N289 Disorder of kidney and ureter, unspecified: Secondary | ICD-10-CM

## 2022-08-26 NOTE — Telephone Encounter (Signed)
Lab app set up please advise on questions.

## 2022-08-27 ENCOUNTER — Other Ambulatory Visit: Payer: Managed Care, Other (non HMO)

## 2022-08-30 ENCOUNTER — Encounter: Payer: Self-pay | Admitting: *Deleted

## 2022-08-31 MED ORDER — METOCLOPRAMIDE HCL 5 MG PO TABS
5.0000 mg | ORAL_TABLET | Freq: Three times a day (TID) | ORAL | 0 refills | Status: DC | PRN
Start: 2022-08-31 — End: 2022-11-11

## 2022-08-31 NOTE — Addendum Note (Signed)
Addended by: Eden Emms on: 08/31/2022 01:33 PM   Modules accepted: Orders

## 2022-09-15 ENCOUNTER — Encounter: Payer: Self-pay | Admitting: Internal Medicine

## 2022-09-15 ENCOUNTER — Ambulatory Visit: Payer: Managed Care, Other (non HMO) | Admitting: Internal Medicine

## 2022-09-15 NOTE — Progress Notes (Deleted)
Name: Mandy Patel  MRN/ DOB: [id number], 05-Aug-1995   Age/ Sex: 27 y.o., female    PCP: Eden Emms, NP   Reason for Endocrinology Evaluation: Type 1 Diabetes Mellitus     Date of Initial Endocrinology Visit: 10/28/2021    PATIENT IDENTIFIER: Mandy Patel is a 27 y.o. female with a past medical history of T1DM. The patient presented for initial endocrinology clinic visit on 10/28/2021 for consultative assistance with her diabetes management.    HPI: Mandy Patel was    Diagnosed with DM at age 70          Hemoglobin A1c has ranged from 11.3% in 2023, peaking at 13.5% in 2012.  On her initial visit to our clinic she had an A1c of 11.3%, we adjusted MDI regimen and a prescription for Dexcom was provided She was also advised to look into insulin pumps  SUBJECTIVE:   During the last visit (10/28/2021): A1c 11.3 percent  Today (09/15/22): Mandy Patel is here for a follow up on diabetes management.  She tried the dexcom but did not like the way it made her feel. She checks her blood sugars 2 times daily. The patient did not bring her meter today   A couple weeks ago she had viral gastritis with diarrhea but this has resolved  She has self increased Lantus from 12 units to 14 units due to persistent hypoglycemia in the morning that she attributed vomiting to .  She is not using the current dose of humalog and    HOME DIABETES REGIMEN: Lantus 12 units daily - takes 14 units now  Humalog 8 units TIQAC Correction factor Humalog (BG -130/45)      Statin: no ACE-I/ARB: yes    METER DOWNLOAD SUMMARY: did not bring     DIABETIC COMPLICATIONS: Microvascular complications:   Denies: CKD Last eye exam: Completed 2 yrs   Macrovascular complications:  Denies: CAD, PVD, CVA   PAST HISTORY: Past Medical History:  Past Medical History:  Diagnosis Date   DKA (diabetic ketoacidosis) (HCC)    Pyelonephritis 03/2017   Type 1 diabetes (HCC)     diagnosed age 36yo    Past Surgical History:  Past Surgical History:  Procedure Laterality Date   EYE MUSCLE SURGERY  approx 2010   bilat eye surgury, left exotropia worse;Dr Maple Hudson, opthomology    Social History:  reports that she has never smoked. She has been exposed to tobacco smoke. She has never used smokeless tobacco. She reports current drug use. Drug: Marijuana. She reports that she does not drink alcohol. Family History:  Family History  Problem Relation Age of Onset   Diabetes Mother        type 2   Hypertension Father    Kidney disease Sister        had kidney transplant.   Diabetes Sister        type 1   Stroke Maternal Grandmother    Diabetes Maternal Grandmother    COPD Maternal Grandfather    Alcohol abuse Maternal Grandfather    Arthritis Paternal Grandmother      HOME MEDICATIONS: Allergies as of 09/15/2022       Reactions   Peanut Oil Other (See Comments)   Reaction to peanuts - gums feel numb and tingling   Other Swelling   Pecans caused throat swelling and weakness        Medication List        Accurate as of September 15, 2022  9:40 AM. If you have any questions, ask your nurse or doctor.          blood glucose meter kit and supplies Kit Dispense based on patient and insurance preference. Use up to four times daily as directed. (FOR ICD-9 250.00, 250.01). Check sugars before meals   Dexcom G7 Sensor Misc Change sensors every 10 days   furosemide 20 MG tablet Commonly known as: LASIX TAKE 1 TABLET(20 MG) BY MOUTH DAILY AS NEEDED   insulin detemir 100 UNIT/ML injection Commonly known as: LEVEMIR Inject 12 Units into the skin daily.   insulin lispro 100 UNIT/ML KwikPen Commonly known as: HUMALOG Max daily 40 units   Insulin Pen Needle 31G X 5 MM Misc 1 Device by Does not apply route in the morning, at noon, in the evening, and at bedtime.   Lantus SoloStar 100 UNIT/ML Solostar Pen Generic drug: insulin glargine Inject 14 Units into the skin at  bedtime.   losartan 25 MG tablet Commonly known as: COZAAR TAKE 1 TABLET(25 MG) BY MOUTH DAILY   metoCLOPramide 5 MG tablet Commonly known as: Reglan Take 1 tablet (5 mg total) by mouth every 8 (eight) hours as needed for nausea.   metoprolol succinate 50 MG 24 hr tablet Commonly known as: TOPROL-XL TAKE 1 TABLET(50 MG) BY MOUTH DAILY WITH OR IMMEDIATELY FOLLOWING A MEAL   NON FORMULARY Take 1 tablet by mouth in the morning and at bedtime. Nutracouticals multi mineral         ALLERGIES: Allergies  Allergen Reactions   Peanut Oil Other (See Comments)    Reaction to peanuts - gums feel numb and tingling   Other Swelling    Pecans caused throat swelling and weakness     REVIEW OF SYSTEMS: A comprehensive ROS was conducted with the patient and is negative except as per HPI    OBJECTIVE:   VITAL SIGNS: There were no vitals taken for this visit.   PHYSICAL EXAM:  General: Pt appears well and is in NAD  Lungs: Clear with good BS bilat   Heart: RRR   Abdomen:  soft, nontender  Extremities:  Lower extremities - no pretibial edema  Neuro: MS is good with appropriate affect, pt is alert and Ox3    DM foot exam: 03/17/2022  The skin of the feet is intact without sores or ulcerations. The pedal pulses are 2+ on right and 2+ on left. The sensation is intact to a screening 5.07, 10 gram monofilament bilaterally   DATA REVIEWED:  Lab Results  Component Value Date   HGBA1C 8.1 (A) 03/17/2022   HGBA1C 8.6 (A) 12/31/2021   HGBA1C 11.3 (A) 10/01/2021   Lab Results  Component Value Date   MICROALBUR 0.8 12/01/2010   LDLCALC 146 (H) 07/01/2022   CREATININE 1.51 (H) 08/24/2022   Lab Results  Component Value Date   MICRALBCREAT 6,744 (H) 08/31/2021    Lab Results  Component Value Date   CHOL 253 (H) 07/01/2022   HDL 65 07/01/2022   LDLCALC 146 (H) 07/01/2022   LDLDIRECT 142.4 04/25/2008   TRIG 235 (H) 07/01/2022   CHOLHDL 3.9 07/01/2022         In office  BG 151 mg/dL   ASSESSMENT / PLAN / RECOMMENDATIONS:   1) Type 1 Diabetes Mellitus, Poorly controlled, Without complications - Most recent A1c of 8.1 %. Goal A1c < 7.0 %.    - A1c trended down from 11.3% to 8.1% but continues to be above goal - She  did not like the way Dexcom made her fell so she stopped it, no glucose data today  - She is not using the humalog correction scale that was previously provided, she continues to guesstimate on  her dosing  - I have recommended carb counting, a referral has been placed   MEDICATIONS: Continue Lantus 14 units daily  Take Humalog 8-10 units TIDQAC Start CF: Humalog (BG-130/45)  EDUCATION / INSTRUCTIONS: BG monitoring instructions: Patient is instructed to check her blood sugars 3 times a day, before meals . Call [facility] if: BG persistently < 70  I reviewed the Rule of 15 for the treatment of hypoglycemia in detail with the patient. Literature supplied.   2) Diabetic complications:  Eye: Does not have known diabetic retinopathy.  Neuro/ Feet: Does not have known diabetic peripheral neuropathy. Renal: Patient does not have known baseline CKD. She is  on an ACEI/ARB at present.         Signed electronically by: Lyndle Herrlich, MD  [facility]  [facility] [address] [place] [place], [place] [address] Phone: [phone] FAX: [phone]   CC: Eden Emms, NP [address] [place] [address]/ Phone: [phone]  Fax: [phone]    Return to Endocrinology clinic as below: Future Appointments  Date Time Provider Department Center  09/15/2022  3:00 PM , Konrad Dolores, MD [facility] None  07/04/2023  3:20 PM Eden Emms, NP [facility]

## 2022-10-15 IMAGING — US US PELVIS COMPLETE WITH TRANSVAGINAL
1 series · 15 of 25 positions shown · non-contrast
Comparison: 03/15/2017 ultrasound.  CT 05/31/2018

CLINICAL DATA: Hydrosalpinx



[Series 1: us pelvis complete · 138 acquisitions, 15 frames shown]
[im 1/138]
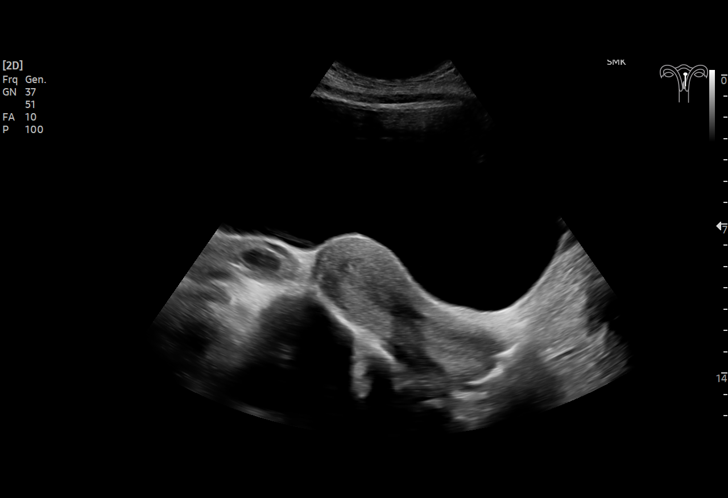
[im 12/138]
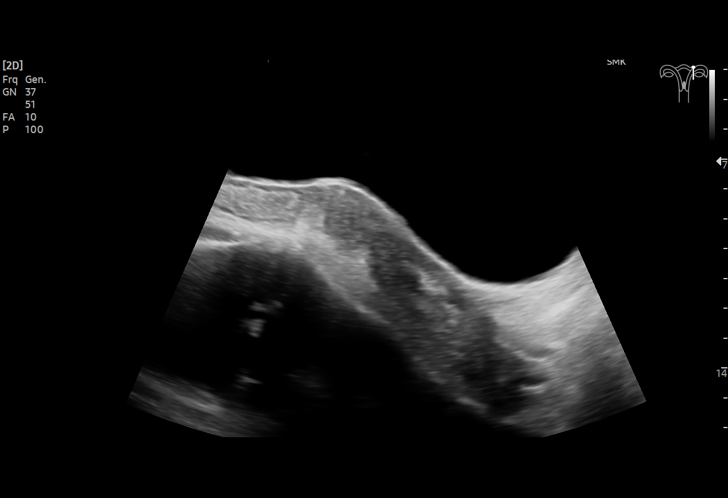
[im 23/138]
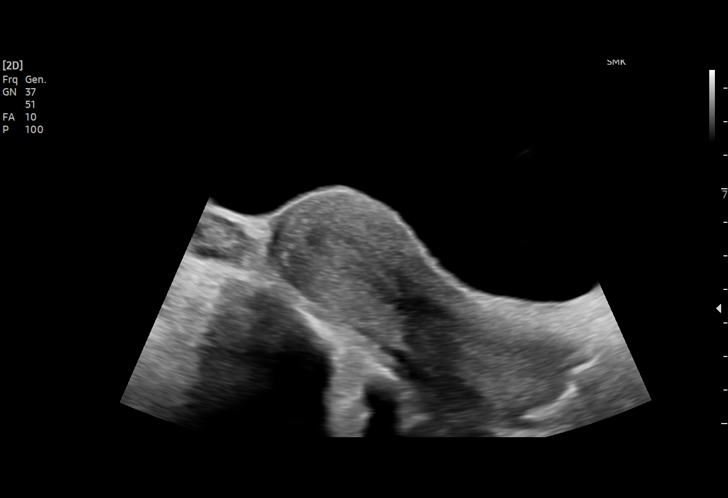
[im 29/138]
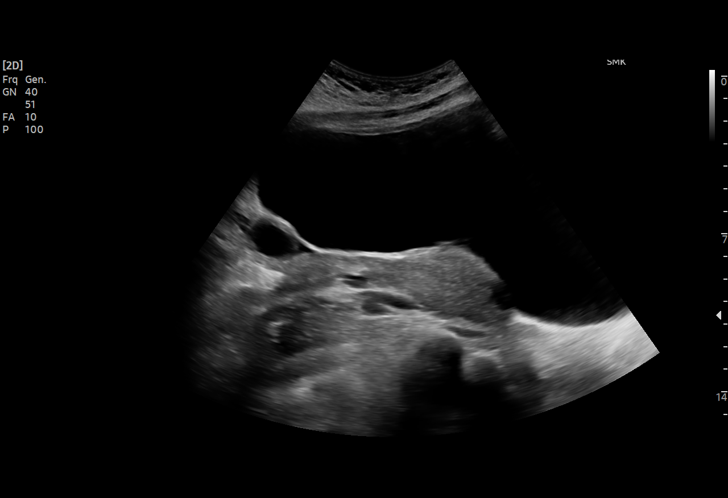
[im 40/138]
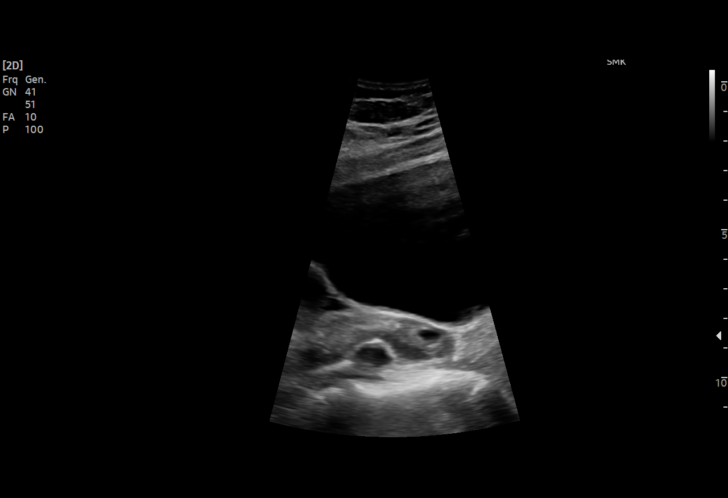
[im 52/138]
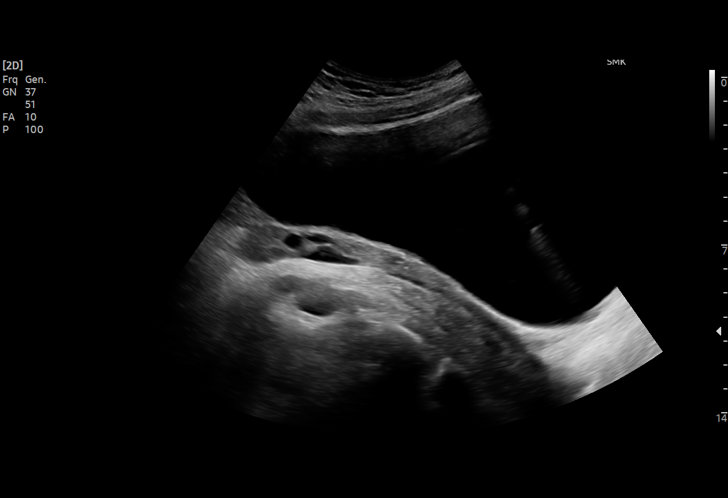
[im 58/138]
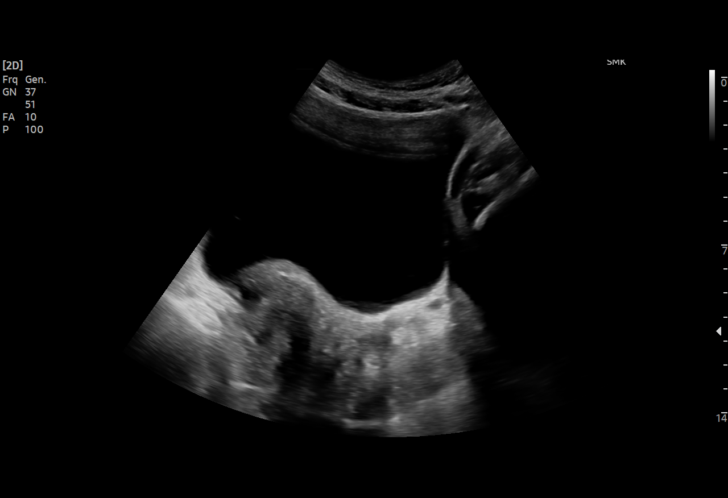
[im 69/138]
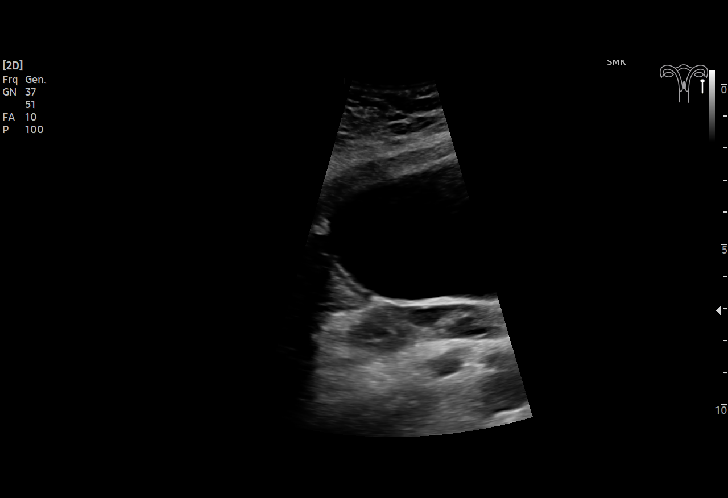
[im 80/138]
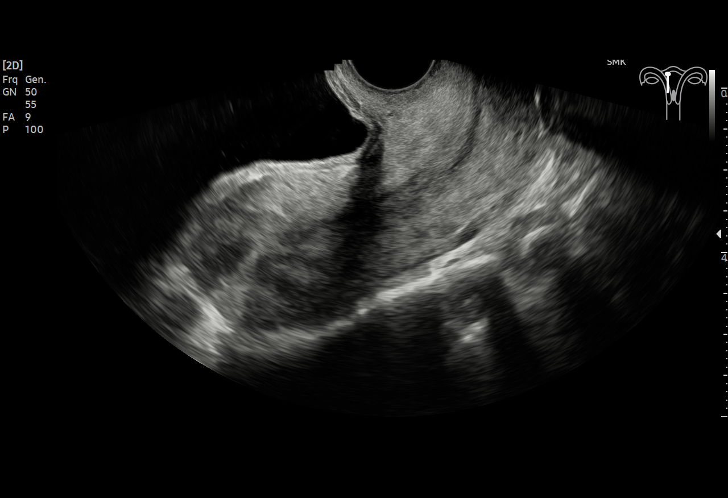
[im 86/138]
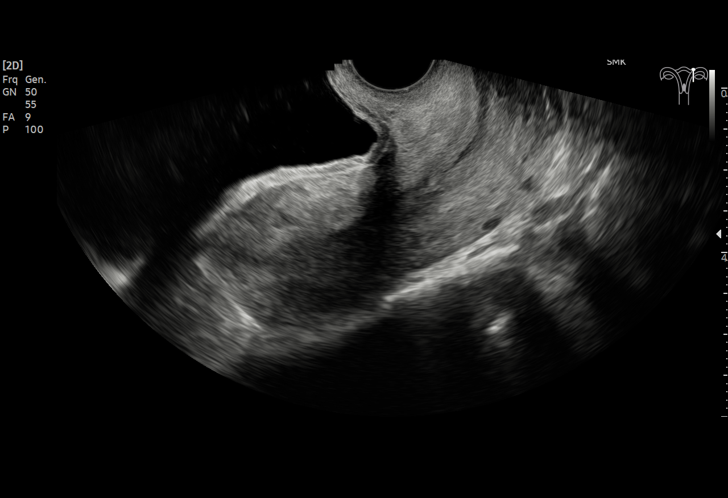
[im 98/138]
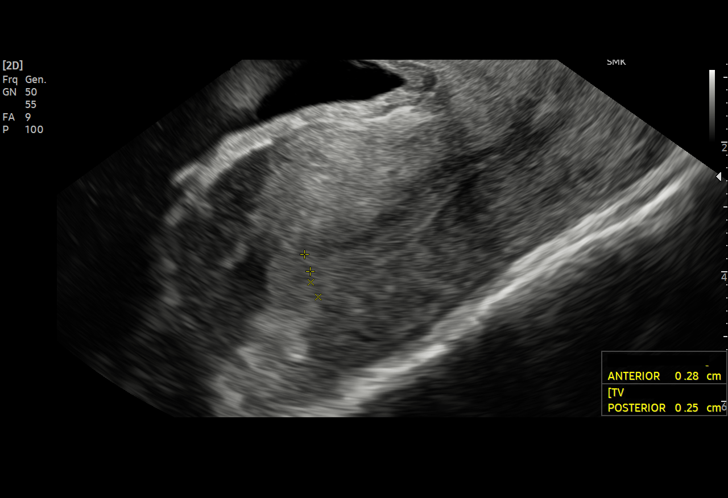
[im 109/138]
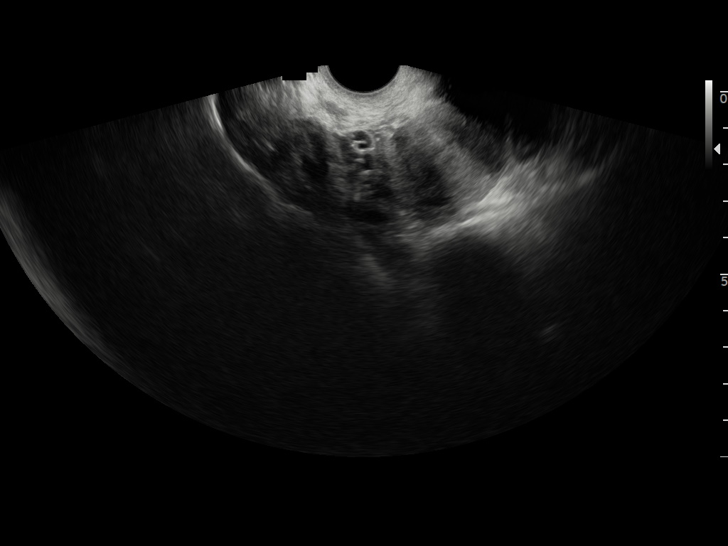
[im 115/138]
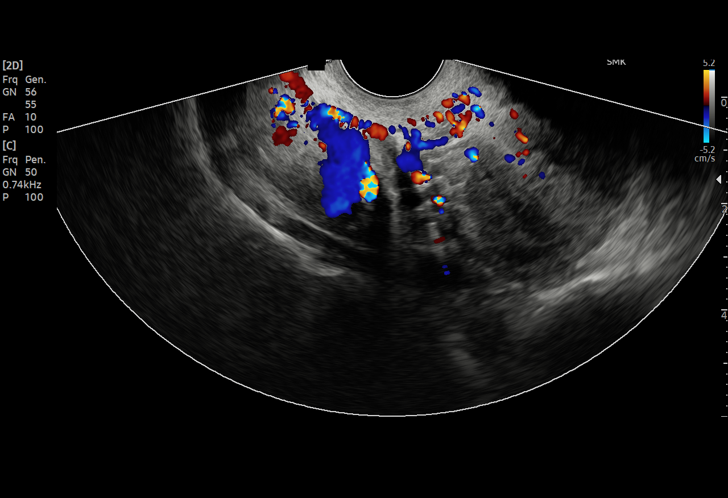
[im 126/138]
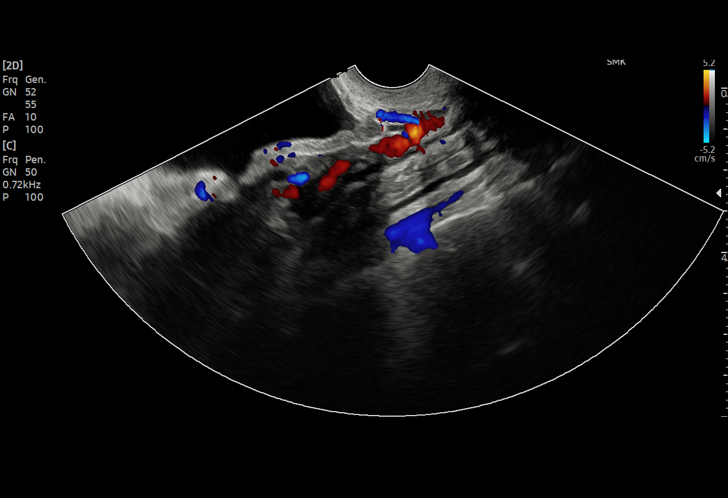
[im 138/138]
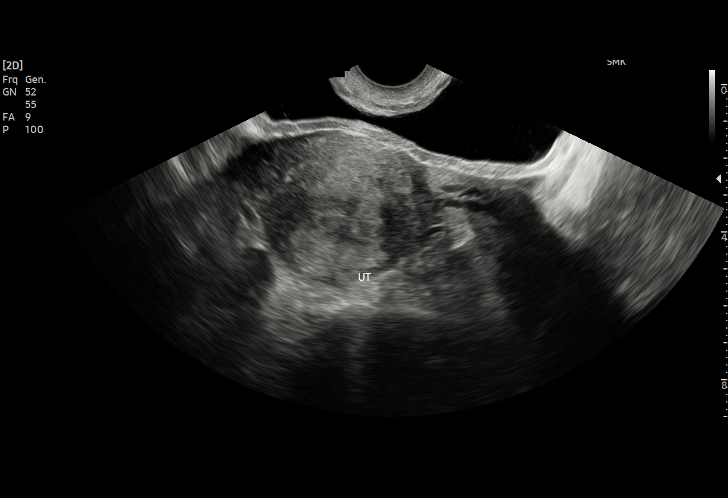

[15 of 25 positions shown; findings below may reference images not displayed]

FINDINGS: Uterus

Measurements: 9.7 x 3.6 x 4.8 cm = volume: 86 mL. No fibroids or
other mass visualized.

Endometrium

Thickness: 5 mm in thickness.  No focal abnormality visualized.

Right ovary

Measurements: 2.6 x 1.4 x 2.4 cm = volume: 4.4 mL. Normal
appearance. Small tubular cystic area within the right adnexa
adjacent to the ovary may reflect small hydrosalpinx.

Left ovary

Measurements: 4.4 x 1.7 x 2.0 cm = volume: 7.8 mL. Normal
appearance. Small tubular cystic area within the adnexa adjacent to
the ovary may reflect small hydrosalpinx.

Other findings

No abnormal free fluid.
IMPRESSION: No acute findings.

Questionable small bilateral hydrosalpinx.

## 2022-10-23 ENCOUNTER — Other Ambulatory Visit: Payer: Self-pay | Admitting: Nurse Practitioner

## 2022-11-11 ENCOUNTER — Other Ambulatory Visit: Payer: Self-pay | Admitting: Nurse Practitioner

## 2022-11-11 DIAGNOSIS — R112 Nausea with vomiting, unspecified: Secondary | ICD-10-CM

## 2022-11-12 MED ORDER — METOCLOPRAMIDE HCL 5 MG PO TABS
5.0000 mg | ORAL_TABLET | Freq: Three times a day (TID) | ORAL | 0 refills | Status: DC | PRN
Start: 2022-11-12 — End: 2023-01-04

## 2023-01-04 ENCOUNTER — Other Ambulatory Visit: Payer: Self-pay | Admitting: Nurse Practitioner

## 2023-01-04 ENCOUNTER — Encounter: Payer: Self-pay | Admitting: Nurse Practitioner

## 2023-01-04 DIAGNOSIS — R112 Nausea with vomiting, unspecified: Secondary | ICD-10-CM

## 2023-01-04 MED ORDER — METOCLOPRAMIDE HCL 5 MG PO TABS
5.0000 mg | ORAL_TABLET | Freq: Three times a day (TID) | ORAL | 1 refills | Status: DC | PRN
Start: 2023-01-04 — End: 2023-03-22

## 2023-01-12 LAB — PROTEIN / CREATININE RATIO, URINE: Creatinine, Urine: 64

## 2023-01-12 LAB — MICROALBUMIN / CREATININE URINE RATIO: Microalb Creat Ratio: 5117

## 2023-01-12 LAB — MICROALBUMIN, URINE: Microalb, Ur: 327.5

## 2023-01-17 ENCOUNTER — Other Ambulatory Visit: Payer: Self-pay | Admitting: Nurse Practitioner

## 2023-01-17 DIAGNOSIS — I152 Hypertension secondary to endocrine disorders: Secondary | ICD-10-CM

## 2023-01-17 NOTE — Telephone Encounter (Signed)
FYI does not look like patient has been seen by Endo in a while.

## 2023-02-15 LAB — HM DIABETES EYE EXAM

## 2023-03-07 ENCOUNTER — Encounter: Payer: Self-pay | Admitting: Nurse Practitioner

## 2023-03-07 ENCOUNTER — Telehealth: Payer: Self-pay

## 2023-03-07 NOTE — Telephone Encounter (Signed)
 Left message to return call to our office.

## 2023-03-07 NOTE — Telephone Encounter (Signed)
Patient is on [facility] report for A1c. Do not see where they have been seen by Endocrinology recently. Do you want me to call and set up follow up.

## 2023-03-07 NOTE — Telephone Encounter (Signed)
Can we call and see why she has not seen endocrinology.  Encourage follow-up with endocrinology please

## 2023-03-08 NOTE — Telephone Encounter (Signed)
Left message to return call to our office.  Sending my chart message as well.

## 2023-03-14 ENCOUNTER — Emergency Department (HOSPITAL_COMMUNITY): Payer: Managed Care, Other (non HMO)

## 2023-03-14 ENCOUNTER — Encounter (HOSPITAL_COMMUNITY): Payer: Self-pay

## 2023-03-14 ENCOUNTER — Inpatient Hospital Stay (HOSPITAL_COMMUNITY)
Admission: EM | Admit: 2023-03-14 | Discharge: 2023-03-22 | DRG: 871 | Disposition: A | Discharge status: Home / Self Care | Payer: Managed Care, Other (non HMO) | Attending: Internal Medicine | Admitting: Internal Medicine

## 2023-03-14 ENCOUNTER — Other Ambulatory Visit: Payer: Self-pay

## 2023-03-14 DIAGNOSIS — Z833 Family history of diabetes mellitus: Secondary | ICD-10-CM

## 2023-03-14 DIAGNOSIS — R110 Nausea: Secondary | ICD-10-CM | POA: Diagnosis present

## 2023-03-14 DIAGNOSIS — Z811 Family history of alcohol abuse and dependence: Secondary | ICD-10-CM

## 2023-03-14 DIAGNOSIS — R0602 Shortness of breath: Secondary | ICD-10-CM | POA: Diagnosis not present

## 2023-03-14 DIAGNOSIS — A599 Trichomoniasis, unspecified: Secondary | ICD-10-CM | POA: Diagnosis present

## 2023-03-14 DIAGNOSIS — E1010 Type 1 diabetes mellitus with ketoacidosis without coma: Principal | ICD-10-CM | POA: Diagnosis present

## 2023-03-14 DIAGNOSIS — A4189 Other specified sepsis: Secondary | ICD-10-CM | POA: Diagnosis present

## 2023-03-14 DIAGNOSIS — Z794 Long term (current) use of insulin: Secondary | ICD-10-CM | POA: Diagnosis not present

## 2023-03-14 DIAGNOSIS — E1022 Type 1 diabetes mellitus with diabetic chronic kidney disease: Secondary | ICD-10-CM | POA: Diagnosis present

## 2023-03-14 DIAGNOSIS — Z5982 Transportation insecurity: Secondary | ICD-10-CM | POA: Diagnosis not present

## 2023-03-14 DIAGNOSIS — Z1152 Encounter for screening for COVID-19: Secondary | ICD-10-CM

## 2023-03-14 DIAGNOSIS — Z841 Family history of disorders of kidney and ureter: Secondary | ICD-10-CM

## 2023-03-14 DIAGNOSIS — R0600 Dyspnea, unspecified: Secondary | ICD-10-CM | POA: Diagnosis not present

## 2023-03-14 DIAGNOSIS — N189 Chronic kidney disease, unspecified: Secondary | ICD-10-CM | POA: Diagnosis not present

## 2023-03-14 DIAGNOSIS — J09X2 Influenza due to identified novel influenza A virus with other respiratory manifestations: Secondary | ICD-10-CM | POA: Diagnosis not present

## 2023-03-14 DIAGNOSIS — Z560 Unemployment, unspecified: Secondary | ICD-10-CM | POA: Diagnosis not present

## 2023-03-14 DIAGNOSIS — J09X1 Influenza due to identified novel influenza A virus with pneumonia: Secondary | ICD-10-CM | POA: Diagnosis not present

## 2023-03-14 DIAGNOSIS — E109 Type 1 diabetes mellitus without complications: Secondary | ICD-10-CM | POA: Diagnosis not present

## 2023-03-14 DIAGNOSIS — I272 Pulmonary hypertension, unspecified: Secondary | ICD-10-CM | POA: Diagnosis present

## 2023-03-14 DIAGNOSIS — Z823 Family history of stroke: Secondary | ICD-10-CM

## 2023-03-14 DIAGNOSIS — I130 Hypertensive heart and chronic kidney disease with heart failure and stage 1 through stage 4 chronic kidney disease, or unspecified chronic kidney disease: Secondary | ICD-10-CM | POA: Diagnosis present

## 2023-03-14 DIAGNOSIS — R6510 Systemic inflammatory response syndrome (SIRS) of non-infectious origin without acute organ dysfunction: Secondary | ICD-10-CM | POA: Diagnosis not present

## 2023-03-14 DIAGNOSIS — Z7951 Long term (current) use of inhaled steroids: Secondary | ICD-10-CM | POA: Diagnosis not present

## 2023-03-14 DIAGNOSIS — D649 Anemia, unspecified: Secondary | ICD-10-CM | POA: Diagnosis not present

## 2023-03-14 DIAGNOSIS — R112 Nausea with vomiting, unspecified: Secondary | ICD-10-CM

## 2023-03-14 DIAGNOSIS — Z79899 Other long term (current) drug therapy: Secondary | ICD-10-CM

## 2023-03-14 DIAGNOSIS — D631 Anemia in chronic kidney disease: Secondary | ICD-10-CM | POA: Diagnosis present

## 2023-03-14 DIAGNOSIS — J9601 Acute respiratory failure with hypoxia: Secondary | ICD-10-CM | POA: Diagnosis not present

## 2023-03-14 DIAGNOSIS — M7989 Other specified soft tissue disorders: Secondary | ICD-10-CM | POA: Diagnosis not present

## 2023-03-14 DIAGNOSIS — N179 Acute kidney failure, unspecified: Secondary | ICD-10-CM | POA: Diagnosis present

## 2023-03-14 DIAGNOSIS — I10 Essential (primary) hypertension: Secondary | ICD-10-CM | POA: Diagnosis not present

## 2023-03-14 DIAGNOSIS — J101 Influenza due to other identified influenza virus with other respiratory manifestations: Secondary | ICD-10-CM | POA: Diagnosis not present

## 2023-03-14 DIAGNOSIS — N1832 Chronic kidney disease, stage 3b: Secondary | ICD-10-CM | POA: Diagnosis present

## 2023-03-14 DIAGNOSIS — J1001 Influenza due to other identified influenza virus with the same other identified influenza virus pneumonia: Secondary | ICD-10-CM | POA: Diagnosis present

## 2023-03-14 DIAGNOSIS — E876 Hypokalemia: Secondary | ICD-10-CM | POA: Diagnosis not present

## 2023-03-14 DIAGNOSIS — J45909 Unspecified asthma, uncomplicated: Secondary | ICD-10-CM | POA: Diagnosis present

## 2023-03-14 DIAGNOSIS — I2721 Secondary pulmonary arterial hypertension: Secondary | ICD-10-CM | POA: Diagnosis not present

## 2023-03-14 DIAGNOSIS — Z91010 Allergy to peanuts: Secondary | ICD-10-CM

## 2023-03-14 DIAGNOSIS — E1043 Type 1 diabetes mellitus with diabetic autonomic (poly)neuropathy: Secondary | ICD-10-CM | POA: Diagnosis present

## 2023-03-14 DIAGNOSIS — Z825 Family history of asthma and other chronic lower respiratory diseases: Secondary | ICD-10-CM

## 2023-03-14 DIAGNOSIS — Z8249 Family history of ischemic heart disease and other diseases of the circulatory system: Secondary | ICD-10-CM

## 2023-03-14 DIAGNOSIS — E1065 Type 1 diabetes mellitus with hyperglycemia: Secondary | ICD-10-CM

## 2023-03-14 LAB — I-STAT VENOUS BLOOD GAS, ED
Acid-base deficit: 12 mmol/L — ABNORMAL HIGH (ref 0.0–2.0)
Bicarbonate: 14.1 mmol/L — ABNORMAL LOW (ref 20.0–28.0)
Calcium, Ion: 1.08 mmol/L — ABNORMAL LOW (ref 1.15–1.40)
HCT: 33 % — ABNORMAL LOW (ref 36.0–46.0)
Hemoglobin: 11.2 g/dL — ABNORMAL LOW (ref 12.0–15.0)
O2 Saturation: 75 %
Potassium: 4.6 mmol/L (ref 3.5–5.1)
Sodium: 135 mmol/L (ref 135–145)
TCO2: 15 mmol/L — ABNORMAL LOW (ref 22–32)
pCO2, Ven: 31.9 mm[Hg] — ABNORMAL LOW (ref 44–60)
pH, Ven: 7.253 (ref 7.25–7.43)
pO2, Ven: 46 mm[Hg] — ABNORMAL HIGH (ref 32–45)

## 2023-03-14 LAB — HEMOGLOBIN A1C
Hgb A1c MFr Bld: 10.1 % — ABNORMAL HIGH (ref 4.8–5.6)
Mean Plasma Glucose: 243.17 mg/dL

## 2023-03-14 LAB — BASIC METABOLIC PANEL
Anion gap: 16 — ABNORMAL HIGH (ref 5–15)
BUN: 31 mg/dL — ABNORMAL HIGH (ref 6–20)
CO2: 15 mmol/L — ABNORMAL LOW (ref 22–32)
Calcium: 8 mg/dL — ABNORMAL LOW (ref 8.9–10.3)
Chloride: 104 mmol/L (ref 98–111)
Creatinine, Ser: 3.15 mg/dL — ABNORMAL HIGH (ref 0.44–1.00)
GFR, Estimated: 20 mL/min — ABNORMAL LOW (ref >60)
Glucose, Bld: 438 mg/dL — ABNORMAL HIGH (ref 70–99)
Potassium: 4.3 mmol/L (ref 3.5–5.1)
Sodium: 135 mmol/L (ref 135–145)

## 2023-03-14 LAB — CBC WITH DIFFERENTIAL/PLATELET
Abs Immature Granulocytes: 0.09 K/uL — ABNORMAL HIGH (ref 0.00–0.07)
Basophils Absolute: 0.1 K/uL (ref 0.0–0.1)
Basophils Relative: 0 %
Eosinophils Absolute: 0 K/uL (ref 0.0–0.5)
Eosinophils Relative: 0 %
HCT: 33.3 % — ABNORMAL LOW (ref 36.0–46.0)
Hemoglobin: 11.1 g/dL — ABNORMAL LOW (ref 12.0–15.0)
Immature Granulocytes: 1 %
Lymphocytes Relative: 6 %
Lymphs Abs: 0.9 K/uL (ref 0.7–4.0)
MCH: 31.5 pg (ref 26.0–34.0)
MCHC: 33.3 g/dL (ref 30.0–36.0)
MCV: 94.6 fL (ref 80.0–100.0)
Monocytes Absolute: 1.3 K/uL — ABNORMAL HIGH (ref 0.1–1.0)
Monocytes Relative: 9 %
Neutro Abs: 12.3 K/uL — ABNORMAL HIGH (ref 1.7–7.7)
Neutrophils Relative %: 84 %
Platelets: 326 K/uL (ref 150–400)
RBC: 3.52 MIL/uL — ABNORMAL LOW (ref 3.87–5.11)
RDW: 12.6 % (ref 11.5–15.5)
WBC: 14.7 K/uL — ABNORMAL HIGH (ref 4.0–10.5)
nRBC: 0 % (ref 0.0–0.2)

## 2023-03-14 LAB — BASIC METABOLIC PANEL WITH GFR
Anion gap: 24 — ABNORMAL HIGH (ref 5–15)
Anion gap: 12 (ref 5–15)
BUN: 33 mg/dL — ABNORMAL HIGH (ref 6–20)
BUN: 33 mg/dL — ABNORMAL HIGH (ref 6–20)
CO2: 11 mmol/L — ABNORMAL LOW (ref 22–32)
CO2: 17 mmol/L — ABNORMAL LOW (ref 22–32)
Calcium: 8.3 mg/dL — ABNORMAL LOW (ref 8.9–10.3)
Calcium: 8.2 mg/dL — ABNORMAL LOW (ref 8.9–10.3)
Chloride: 100 mmol/L (ref 98–111)
Chloride: 109 mmol/L (ref 98–111)
Creatinine, Ser: 3.59 mg/dL — ABNORMAL HIGH (ref 0.44–1.00)
Creatinine, Ser: 3.1 mg/dL — ABNORMAL HIGH (ref 0.44–1.00)
GFR, Estimated: 17 mL/min — ABNORMAL LOW
GFR, Estimated: 20 mL/min — ABNORMAL LOW
Glucose, Bld: 586 mg/dL (ref 70–99)
Glucose, Bld: 194 mg/dL — ABNORMAL HIGH (ref 70–99)
Potassium: 4.6 mmol/L (ref 3.5–5.1)
Potassium: 3.8 mmol/L (ref 3.5–5.1)
Sodium: 135 mmol/L (ref 135–145)
Sodium: 138 mmol/L (ref 135–145)

## 2023-03-14 LAB — HCG, SERUM, QUALITATIVE: Preg, Serum: NEGATIVE

## 2023-03-14 LAB — BETA-HYDROXYBUTYRIC ACID
Beta-Hydroxybutyric Acid: 7.05 mmol/L — ABNORMAL HIGH (ref 0.05–0.27)
Beta-Hydroxybutyric Acid: 1.6 mmol/L — ABNORMAL HIGH (ref 0.05–0.27)

## 2023-03-14 LAB — URINALYSIS, MICROSCOPIC (REFLEX)

## 2023-03-14 LAB — URINALYSIS, ROUTINE W REFLEX MICROSCOPIC
Bilirubin Urine: NEGATIVE
Glucose, UA: >=500 mg/dL — AB
Ketones, ur: 40 mg/dL — AB
Leukocytes,Ua: NEGATIVE
Nitrite: NEGATIVE
Protein, ur: >300 mg/dL — AB
Specific Gravity, Urine: 1.02 (ref 1.005–1.030)
pH: 6 (ref 5.0–8.0)

## 2023-03-14 LAB — HEPATIC FUNCTION PANEL
ALT: 13 U/L (ref 0–44)
AST: 11 U/L — ABNORMAL LOW (ref 15–41)
Albumin: 2.8 g/dL — ABNORMAL LOW (ref 3.5–5.0)
Alkaline Phosphatase: 94 U/L (ref 38–126)
Bilirubin, Direct: <0.1 mg/dL (ref 0.0–0.2)
Total Bilirubin: 1.2 mg/dL (ref 0.0–1.2)
Total Protein: 6.9 g/dL (ref 6.5–8.1)

## 2023-03-14 LAB — CBG MONITORING, ED
Glucose-Capillary: 509 mg/dL (ref 70–99)
Glucose-Capillary: 461 mg/dL — ABNORMAL HIGH (ref 70–99)
Glucose-Capillary: 526 mg/dL (ref 70–99)
Glucose-Capillary: 388 mg/dL — ABNORMAL HIGH (ref 70–99)
Glucose-Capillary: 408 mg/dL — ABNORMAL HIGH (ref 70–99)
Glucose-Capillary: 273 mg/dL — ABNORMAL HIGH (ref 70–99)
Glucose-Capillary: 279 mg/dL — ABNORMAL HIGH (ref 70–99)
Glucose-Capillary: 179 mg/dL — ABNORMAL HIGH (ref 70–99)
Glucose-Capillary: 181 mg/dL — ABNORMAL HIGH (ref 70–99)
Glucose-Capillary: 158 mg/dL — ABNORMAL HIGH (ref 70–99)
Glucose-Capillary: 166 mg/dL — ABNORMAL HIGH (ref 70–99)
Glucose-Capillary: 171 mg/dL — ABNORMAL HIGH (ref 70–99)
Glucose-Capillary: 183 mg/dL — ABNORMAL HIGH (ref 70–99)

## 2023-03-14 LAB — RESP PANEL BY RT-PCR (RSV, FLU A&B, COVID)  RVPGX2
Influenza A by PCR: POSITIVE — AB
Influenza B by PCR: NEGATIVE
Resp Syncytial Virus by PCR: NEGATIVE
SARS Coronavirus 2 by RT PCR: NEGATIVE

## 2023-03-14 LAB — PROCALCITONIN: Procalcitonin: 0.35 ng/mL

## 2023-03-14 LAB — LIPASE, BLOOD: Lipase: 21 U/L (ref 11–51)

## 2023-03-14 LAB — HIV ANTIBODY (ROUTINE TESTING W REFLEX): HIV Screen 4th Generation wRfx: NONREACTIVE

## 2023-03-14 LAB — GLUCOSE, CAPILLARY
Glucose-Capillary: 155 mg/dL — ABNORMAL HIGH (ref 70–99)
Glucose-Capillary: 199 mg/dL — ABNORMAL HIGH (ref 70–99)

## 2023-03-14 MED ORDER — METOCLOPRAMIDE HCL 5 MG/ML IJ SOLN
10.0000 mg | Freq: Once | INTRAMUSCULAR | Status: AC
  Administered 2023-03-14: 10 mg via INTRAVENOUS
  Filled 2023-03-14: qty 2

## 2023-03-14 MED ORDER — HEPARIN SODIUM (PORCINE) 5000 UNIT/ML IJ SOLN
5000.0000 [IU] | Freq: Three times a day (TID) | INTRAMUSCULAR | Status: DC
  Administered 2023-03-14 – 2023-03-22 (×24): 5000 [IU] via SUBCUTANEOUS
  Filled 2023-03-14 (×21): qty 1

## 2023-03-14 MED ORDER — POTASSIUM CHLORIDE 10 MEQ/100ML IV SOLN
10.0000 meq | INTRAVENOUS | Status: AC
  Administered 2023-03-14 (×2): 10 meq via INTRAVENOUS
  Filled 2023-03-14 (×2): qty 100

## 2023-03-14 MED ORDER — METRONIDAZOLE 500 MG PO TABS
2000.0000 mg | ORAL_TABLET | Freq: Once | ORAL | Status: AC
  Administered 2023-03-14: 2000 mg via ORAL
  Filled 2023-03-14: qty 4

## 2023-03-14 MED ORDER — DEXTROSE 50 % IV SOLN: 0.0000 mL | INTRAVENOUS | Status: DC | PRN

## 2023-03-14 MED ORDER — LACTATED RINGERS IV BOLUS
20.0000 mL/kg | Freq: Once | INTRAVENOUS | Status: AC
  Administered 2023-03-14: 1750 mL via INTRAVENOUS

## 2023-03-14 MED ORDER — ACETAMINOPHEN 650 MG RE SUPP: 650.0000 mg | Freq: Four times a day (QID) | RECTAL | Status: DC | PRN

## 2023-03-14 MED ORDER — LACTATED RINGERS IV BOLUS
1000.0000 mL | Freq: Once | INTRAVENOUS | Status: AC
Start: 2023-03-14 — End: 2023-03-14
  Administered 2023-03-14: 1000 mL via INTRAVENOUS

## 2023-03-14 MED ORDER — MORPHINE SULFATE (PF) 2 MG/ML IV SOLN
2.0000 mg | INTRAVENOUS | Status: DC | PRN
  Administered 2023-03-14: 2 mg via INTRAVENOUS
  Filled 2023-03-14: qty 1

## 2023-03-14 MED ORDER — TRAMADOL HCL 50 MG PO TABS
50.0000 mg | ORAL_TABLET | Freq: Four times a day (QID) | ORAL | Status: DC | PRN
  Administered 2023-03-16 – 2023-03-17 (×2): 50 mg via ORAL
  Filled 2023-03-14 (×2): qty 1

## 2023-03-14 MED ORDER — ONDANSETRON HCL 4 MG/2ML IJ SOLN
4.0000 mg | Freq: Four times a day (QID) | INTRAMUSCULAR | Status: DC | PRN
  Administered 2023-03-14 – 2023-03-16 (×5): 4 mg via INTRAVENOUS
  Filled 2023-03-14 (×7): qty 2

## 2023-03-14 MED ORDER — ONDANSETRON HCL 4 MG/2ML IJ SOLN
4.0000 mg | Freq: Once | INTRAMUSCULAR | Status: AC
  Administered 2023-03-14: 4 mg via INTRAVENOUS
  Filled 2023-03-14: qty 2

## 2023-03-14 MED ORDER — PANTOPRAZOLE SODIUM 40 MG IV SOLR
40.0000 mg | INTRAVENOUS | Status: DC
  Administered 2023-03-14 – 2023-03-19 (×6): 40 mg via INTRAVENOUS
  Filled 2023-03-14 (×6): qty 10

## 2023-03-14 MED ORDER — HYDRALAZINE HCL 20 MG/ML IJ SOLN: 10.0000 mg | INTRAMUSCULAR | Status: DC | PRN

## 2023-03-14 MED ORDER — ALBUTEROL SULFATE (2.5 MG/3ML) 0.083% IN NEBU: 2.5000 mg | INHALATION_SOLUTION | Freq: Four times a day (QID) | RESPIRATORY_TRACT | Status: DC | PRN

## 2023-03-14 MED ORDER — FAMOTIDINE IN NACL 20-0.9 MG/50ML-% IV SOLN
20.0000 mg | Freq: Once | INTRAVENOUS | Status: AC
  Administered 2023-03-14: 20 mg via INTRAVENOUS
  Filled 2023-03-14: qty 50

## 2023-03-14 MED ORDER — ACETAMINOPHEN 325 MG PO TABS
650.0000 mg | ORAL_TABLET | Freq: Four times a day (QID) | ORAL | Status: DC | PRN
  Administered 2023-03-15 – 2023-03-21 (×6): 650 mg via ORAL
  Filled 2023-03-14 (×6): qty 2

## 2023-03-14 MED ORDER — INSULIN REGULAR(HUMAN) IN NACL 100-0.9 UT/100ML-% IV SOLN
INTRAVENOUS | Status: DC
  Administered 2023-03-14: 8 [IU]/h via INTRAVENOUS
  Administered 2023-03-15: 2.6 [IU]/h via INTRAVENOUS
  Filled 2023-03-14 (×2): qty 100

## 2023-03-14 MED ORDER — DEXTROSE IN LACTATED RINGERS 5 % IV SOLN: INTRAVENOUS | Status: AC

## 2023-03-14 MED ORDER — SODIUM CHLORIDE 0.9% FLUSH
3.0000 mL | Freq: Two times a day (BID) | INTRAVENOUS | Status: DC
  Administered 2023-03-14 – 2023-03-21 (×16): 3 mL via INTRAVENOUS

## 2023-03-14 MED ORDER — LACTATED RINGERS IV SOLN: INTRAVENOUS | Status: AC

## 2023-03-14 NOTE — ED Notes (Signed)
 While obtaining pt's blood sugar, pt reported that her right arm felt very "tight" and uncomfortable. Assessment showed significant infiltration of antecubital IV. RN paused all infusions and initiated new PIV in left forearm, 20ga on first attempt. Tried for 2nd PIV without success. Restarted insulin  drip per [facility] and will place order for IV Team consult to obtain additional IV access. Provided pt with ice packs to relieve discomfort and swelling. Pt has right arm elevated on pillows with ice packs on place. LR bolus and infusion are on hold at this time.

## 2023-03-14 NOTE — ED Triage Notes (Signed)
 PT BIB [facility] from home after N/V x3 days, PT states they are dehydrated and having s/s of high blood sugar. PT is diabetic with no Rx x2 days, [facility] vitals  112 HR, 97% O2, 156/98 BP, RR 33, CBG >600. PT aox4.

## 2023-03-14 NOTE — Progress Notes (Signed)
 Pt arrived to 5W01 via stretcher from ED ~2230 A&Ox 4  RA  Walked to bed  NAD  Insulin  gtt infusing  Connected to tele monitor, call bell within reach, bed in lowest position

## 2023-03-14 NOTE — ED Provider Notes (Signed)
 [place] EMERGENCY DEPARTMENT AT [facility] Provider Note   CSN: [id number] Arrival date & time: 03/14/23  4098     History  Chief Complaint  Patient presents with   Nausea   Weakness    Mandy Patel is a 28 y.o. female.  Patient is a 28 year old female with a past medical history of type 1 diabetes, CKD, hypertension presenting to the emergency department with hyperglycemia, nausea and vomiting.  The patient states that she started to develop nausea and vomiting on Friday and has been unable to keep anything down over the weekend and has been unable to take any of her medications.  She states that she has had a few episodes of diarrhea.  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  She denies any associated fever, cough or congestion.  She denies any abdominal pain.  Per EMS her blood sugar read "high".  She did receive 4 mg of Zofran  and route and she reports no significant improvement.  She states she has been in DKA in the past and this does feel similar.  The history is provided by the patient and the EMS personnel.       Home Medications Prior to Admission medications   Medication Sig Start Date End Date Taking? Authorizing Provider  blood glucose meter kit and supplies KIT Dispense based on patient and insurance preference. Use up to four times daily as directed. (FOR ICD-9 250.00, 250.01). Check sugars before meals 06/25/21   Verla Glaze, MD  Continuous Blood Gluc Sensor (DEXCOM G7 SENSOR) MISC Change sensors every 10 days 04/27/22   Shamleffer, Ibtehal Jaralla, MD  furosemide  (LASIX ) 20 MG tablet TAKE 1 TABLET(20 MG) BY MOUTH DAILY AS NEEDED 07/15/22   Dorothe Gaster, NP  insulin  detemir (LEVEMIR ) 100 UNIT/ML injection Inject 12 Units into the skin daily.    [provider]  insulin  glargine (LANTUS  SOLOSTAR) 100 UNIT/ML Solostar Pen Inject 14 Units into the skin at bedtime. 03/17/22    Shamleffer, Ibtehal Jaralla, MD  insulin  lispro (HUMALOG ) 100 UNIT/ML KwikPen Max daily 40 units 03/17/22   Shamleffer, Ibtehal Jaralla, MD  Insulin  Pen Needle 31G X 5 MM MISC 1 Device by Does not apply route in the morning, at noon, in the evening, and at bedtime. 03/17/22   Shamleffer, Ibtehal Jaralla, MD  losartan  (COZAAR ) 25 MG tablet TAKE 1 TABLET(25 MG) BY MOUTH DAILY 01/17/23   Dorothe Gaster, NP  metoCLOPramide  (REGLAN ) 5 MG tablet Take 1 tablet (5 mg total) by mouth every 8 (eight) hours as needed for nausea. 01/04/23   Dorothe Gaster, NP  metoprolol  succinate (TOPROL -XL) 50 MG 24 hr tablet TAKE 1 TABLET(50 MG) BY MOUTH DAILY WITH OR IMMEDIATELY FOLLOWING A MEAL 10/25/22   Dorothe Gaster, NP  NON FORMULARY Take 1 tablet by mouth in the morning and at bedtime. Nutracouticals multi mineral    [provider]      Allergies    Peanut oil and Other    Review of Systems   Review of Systems  Physical Exam Updated Vital Signs BP (!) 152/92 (BP Location: Right Arm)   Pulse (!) 109   Temp 98.2 F (36.8 C)   Resp (!) 26   Ht 5\' 8"  (1.727 m)   Wt 87.5 kg   LMP 03/14/2023   SpO2 (!) 76%   BMI 29.35 kg/m  Physical Exam Vitals and  nursing note reviewed.  Constitutional:      General: She is not in acute distress.    Appearance: Normal appearance. She is ill-appearing.  HENT:     Head: Normocephalic and atraumatic.     Nose: Nose normal.     Mouth/Throat:     Mouth: Mucous membranes are dry.     Pharynx: Oropharynx is clear.  Eyes:     Extraocular Movements: Extraocular movements intact.     Conjunctiva/sclera: Conjunctivae normal.  Cardiovascular:     Rate and Rhythm: Normal rate and regular rhythm.     Pulses: Normal pulses.  Pulmonary:     Effort: Pulmonary effort is normal.     Breath sounds: Normal breath sounds.  Abdominal:     General: Abdomen is flat.     Palpations: Abdomen is soft.     Tenderness: There is no abdominal tenderness.  Musculoskeletal:         General: Normal range of motion.     Cervical back: Normal range of motion and neck supple.  Skin:    General: Skin is warm and dry.  Neurological:     General: No focal deficit present.     Mental Status: She is alert and oriented to person, place, and time.  Psychiatric:        Mood and Affect: Mood normal.        Behavior: Behavior normal.     ED Results / Procedures / Treatments   Labs (all labs ordered are listed, but only abnormal results are displayed) Labs Reviewed  BASIC METABOLIC PANEL - Abnormal; Notable for the following components:      Result Value   CO2 11 (*)    Glucose, Bld 586 (*)    BUN 33 (*)    Creatinine, Ser 3.59 (*)    Calcium 8.3 (*)    GFR, Estimated 17 (*)    Anion gap 24 (*)    All other components within normal limits  BETA-HYDROXYBUTYRIC ACID - Abnormal; Notable for the following components:   Beta-Hydroxybutyric Acid 7.05 (*)    All other components within normal limits  CBC WITH DIFFERENTIAL/PLATELET - Abnormal; Notable for the following components:   WBC 14.7 (*)    RBC 3.52 (*)    Hemoglobin 11.1 (*)    HCT 33.3 (*)    Neutro Abs 12.3 (*)    Monocytes Absolute 1.3 (*)    Abs Immature Granulocytes 0.09 (*)    All other components within normal limits  HEPATIC FUNCTION PANEL - Abnormal; Notable for the following components:   Albumin  2.8 (*)    AST 11 (*)    All other components within normal limits  CBG MONITORING, ED - Abnormal; Notable for the following components:   Glucose-Capillary 509 (*)    All other components within normal limits  I-STAT VENOUS BLOOD GAS, ED - Abnormal; Notable for the following components:   pCO2, Ven 31.9 (*)    pO2, Ven 46 (*)    Bicarbonate 14.1 (*)    TCO2 15 (*)    Acid-base deficit 12.0 (*)    Calcium, Ion 1.08 (*)    HCT 33.0 (*)    Hemoglobin 11.2 (*)    All other components within normal limits  RESP PANEL BY RT-PCR (RSV, FLU A&B, COVID)  RVPGX2  LIPASE, BLOOD  BASIC METABOLIC PANEL  BASIC  METABOLIC PANEL  BASIC METABOLIC PANEL  BETA-HYDROXYBUTYRIC ACID  BETA-HYDROXYBUTYRIC ACID  BETA-HYDROXYBUTYRIC ACID  URINALYSIS, ROUTINE W REFLEX  MICROSCOPIC  HCG, SERUM, QUALITATIVE    EKG EKG Interpretation Date/Time:  Monday March 14 2023 09:05:29 EST Ventricular Rate:  121 PR Interval:  140 QRS Duration:  85 QT Interval:  338 QTC Calculation: 480 R Axis:   85  Text Interpretation: Sinus tachycardia Ventricular premature complex Aberrant complex LAE, consider biatrial enlargement Borderline prolonged QT interval No significant change since last tracing Confirmed by Celesta Coke ([id number]) on 03/14/2023 9:09:23 AM  Radiology No results found.  Procedures .Critical Care  Performed by: Kingsley, Makayleigh Poliquin K, DO Authorized by: Nolberto Batty, DO   Critical care provider statement:    Critical care time (minutes):  30   Critical care was necessary to treat or prevent imminent or life-threatening deterioration of the following conditions:  Endocrine crisis   Critical care was time spent personally by me on the following activities:  Development of treatment plan with patient or surrogate, discussions with consultants, evaluation of patient's response to treatment, examination of patient, ordering and review of laboratory studies, ordering and review of radiographic studies, ordering and performing treatments and interventions, pulse oximetry, re-evaluation of patient's condition and review of old charts   I assumed direction of critical care for this patient from another provider in my specialty: no     Care discussed with: admitting provider       Medications Ordered in ED Medications  famotidine  (PEPCID ) IVPB 20 mg premix (20 mg Intravenous New Bag/Given 03/14/23 0954)  insulin  regular, human (MYXREDLIN ) 100 units/ 100 mL infusion (has no administration in time range)  lactated ringers  infusion (has no administration in time range)  dextrose  5 % in lactated ringers   infusion (has no administration in time range)  dextrose  50 % solution 0-50 mL (has no administration in time range)  potassium chloride  10 mEq in 100 mL IVPB (has no administration in time range)  ondansetron  (ZOFRAN ) injection 4 mg (has no administration in time range)  lactated ringers  bolus 1,750 mL (1,750 mLs Intravenous New Bag/Given 03/14/23 0906)  metoCLOPramide  (REGLAN ) injection 10 mg (10 mg Intravenous Given 03/14/23 0859)    ED Course/ Medical Decision Making/ A&P Clinical Course as of 03/14/23 0955  Mon Mar 14, 2023  0942 AGAP 24 concerning for DKA. Will be started on insulin  drip. AKI on CKD with baseline Cr 1.5. She is being fluid resuscitated. [VK]  [id number] Initial pulse ox recorded as 76%, I turned off patient's O2 and she's satting 97-99% on RA, unsure if that was a true hypoxia. Patient will be admitted for DKA and AKI. [VK]    Clinical Course User Index [VK] Kingsley, Greta Yung K, DO                                 Medical Decision Making This patient presents to the ED with chief complaint(s) of N/V, hyperglycemia with pertinent past medical history of DKA, HTN, CKD which further complicates the presenting complaint. The complaint involves an extensive differential diagnosis and also carries with it a high risk of complications and morbidity.    The differential diagnosis includes hyperglycemic crisis, gastroenteritis, dehydration, electrolyte abnormality, viral syndrome, sepsis, pancreatitis, hepatitis, gastritis, GERD   Additional history obtained: Additional history obtained from EMS  Records reviewed Care Everywhere/External Records  ED Course and Reassessment:   Independent labs interpretation:  The following labs were independently interpreted: hyperglycemia with elevated AGAP and BHB consistent with DKA, AKI on CKD  Independent visualization of imaging: -  I independently visualized the following imaging with scope of interpretation limited to determining acute  life threatening conditions related to emergency care: CXR, which revealed no acute disease  Consultation: - Consulted or discussed management/test interpretation w/ external professional: hospitalist  Consideration for admission or further workup: patient requires admission for DKA and AKI Social Determinants of health: N/A    Amount and/or Complexity of Data Reviewed Labs: ordered. Radiology: ordered.  Risk Prescription drug management. Decision regarding hospitalization.          Final Clinical Impression(s) / ED Diagnoses Final diagnoses:  Diabetic ketoacidosis without coma associated with type 1 diabetes mellitus (HCC)  AKI (acute kidney injury) [facility])    Rx / DC Orders ED Discharge Orders     None         Kingsley, Renly Roots K, DO 03/14/23 [phone]

## 2023-03-14 NOTE — ED Notes (Signed)
 Called CCMD at [phone] to initiate cardiac monitoring.

## 2023-03-14 NOTE — H&P (Addendum)
 History and Physical    Patient: Mandy Patel NWG:[id number] DOB: 1995/06/28 DOA: 03/14/2023 DOS: the patient was seen and examined on 03/14/2023 PCP: Dorothe Gaster, NP  Patient coming from: Home  Chief Complaint:  Chief Complaint  Patient presents with   Nausea   Weakness   HPI: Mandy Patel is a 28 y.o. female with medical history significant of diabetes mellitus type 1 who presents with nausea and vomiting over the last 3 days.  Patient reported having associated symptoms of chills, mild shortness of breath, cough, heartburn, and diarrhea.  She had tried to manage her symptoms by drinking water , but reports being unable to keep it down.  Denied having any abdominal pain or dysuria symptoms.  Due to her symptoms she reported having progressively worsening weakness to the point which she was being difficulty even walking to the bathroom.  Patient is on lispro 3 times daily with a sliding scale and Lantus  16 units nightly.  However, because of her symptoms she had been unable to get out of bed to continue these medications recently.   Upon admission into the emergency department patient was noted to be afebrile with pulse elevated up to 119, respiration 24-31, blood pressures elevated up to 162/98, O2 saturations currently maintained on room air.  Labs significant for WBC 14 .7, hemoglobin 11.1, CO2 11, BUN 33, creatinine 3.59, glucose 586, anion gap 24, and beta hydroxybutyrate acid 7.05.  Venous pH was noted to be within normal limits at 7.253.  Chest x-ray noted no acute abnormality.  Respiratory virus panel was pending.  Patient had been given Zofran  4 mg IV, Reglan  10 mg IV, bolused 1.75 L of lactated Ringer 's, Review of Systems: As mentioned in the history of present illness. All other systems reviewed and are negative. Past Medical History:  Diagnosis Date   DKA (diabetic ketoacidosis) (HCC)    Pyelonephritis 03/2017   Type 1 diabetes [facility])     diagnosed age 48yo   Past  Surgical History:  Procedure Laterality Date   EYE MUSCLE SURGERY  approx 2010   bilat eye surgury, left exotropia worse;Dr Linder Revere, opthomology   Social History:  reports that she has never smoked. She has been exposed to tobacco smoke. She has never used smokeless tobacco. She reports current drug use. Drug: Marijuana. She reports that she does not drink alcohol.  Allergies  Allergen Reactions   Peanut Oil Other (See Comments)    Reaction to peanuts - gums feel numb and tingling   Other Swelling    Pecans caused throat swelling and weakness    Family History  Problem Relation Age of Onset   Diabetes Mother        type 2   Hypertension Father    Kidney disease Sister        had kidney transplant.   Diabetes Sister        type 1   Stroke Maternal Grandmother    Diabetes Maternal Grandmother    COPD Maternal Grandfather    Alcohol abuse Maternal Grandfather    Arthritis Paternal Grandmother     Prior to Admission medications   Medication Sig Start Date End Date Taking? Authorizing Provider  blood glucose meter kit and supplies KIT Dispense based on patient and insurance preference. Use up to four times daily as directed. (FOR ICD-9 250.00, 250.01). Check sugars before meals 06/25/21   Verla Glaze, MD  Continuous Blood Gluc Sensor (DEXCOM G7 SENSOR) MISC Change sensors every 10 days 04/27/22  Shamleffer, Julian Obey, MD  furosemide  (LASIX ) 20 MG tablet TAKE 1 TABLET(20 MG) BY MOUTH DAILY AS NEEDED 07/15/22   Dorothe Gaster, NP  insulin  detemir (LEVEMIR ) 100 UNIT/ML injection Inject 12 Units into the skin daily.    [provider]  insulin  glargine (LANTUS  SOLOSTAR) 100 UNIT/ML Solostar Pen Inject 14 Units into the skin at bedtime. 03/17/22   Shamleffer, Ibtehal Jaralla, MD  insulin  lispro (HUMALOG ) 100 UNIT/ML KwikPen Max daily 40 units 03/17/22   Shamleffer, Ibtehal Jaralla, MD  Insulin  Pen Needle 31G X 5 MM MISC 1 Device by Does not apply route in the morning, at  noon, in the evening, and at bedtime. 03/17/22   Shamleffer, Ibtehal Jaralla, MD  losartan  (COZAAR ) 25 MG tablet TAKE 1 TABLET(25 MG) BY MOUTH DAILY 01/17/23   Dorothe Gaster, NP  metoCLOPramide  (REGLAN ) 5 MG tablet Take 1 tablet (5 mg total) by mouth every 8 (eight) hours as needed for nausea. 01/04/23   Dorothe Gaster, NP  metoprolol  succinate (TOPROL -XL) 50 MG 24 hr tablet TAKE 1 TABLET(50 MG) BY MOUTH DAILY WITH OR IMMEDIATELY FOLLOWING A MEAL 10/25/22   Dorothe Gaster, NP  NON FORMULARY Take 1 tablet by mouth in the morning and at bedtime. Nutracouticals multi mineral    [provider]    Physical Exam: Vitals:   03/14/23 0850 03/14/23 0910 03/14/23 0945 03/14/23 0950  BP:   (!) 162/98   Pulse: (!) 111 (!) 119 (!) 109 (!) 109  Resp: (!) 24 (!) 31 (!) 28 (!) 27  Temp:      SpO2: 97% 98% 100% 100%  Weight:      Height:         Constitutional: Young female who appears acutely ill eyes: PERRL, lids and conjunctivae normal ENMT: Mucous membranes are dry.  Normal dentition.  Neck: normal, supple  Respiratory: clear to auscultation bilaterally, no wheezing, no crackles. Normal respiratory effort. No accessory muscle use.  Cardiovascular: Tachycardic. No extremity edema. 2+ pedal pulses. No carotid bruits.  Abdomen: no tenderness, no masses palpated. Bowel sounds positive.  Musculoskeletal: no clubbing / cyanosis. No joint deformity upper and lower extremities. Good ROM, no contractures. Normal muscle tone.  Skin: no rashes, lesions, ulcers. No induration Neurologic: CN 2-12 grossly intact. Sensation intact, DTR normal. Strength 5/5 in all 4.  Psychiatric: Normal judgment and insight. Alert and oriented x 3. Normal mood.   Data Reviewed:  EKG reveals sinus tachycardia 121 bpm with PVCs.  Reviewed labs, imaging, primary process documented.  Assessment and Plan:  DKA, type I Patient presents with blood sugar elevated at 586, CO2 11, anion gap 24, and beta hydroxybutyrate acid  7.05.  Venous pH was noted to be within normal limits.  Urinalysis had not been obtained yet.  Patient had been bolused IV fluids, potassium chloride  20 meq IV, and started on insulin  drip per protocol.  Records note last hemoglobin A1c was 8.1 back in 03/2022. -Admit to a progressive bed -Hyperglycemia order set utilized -Serial BMPs, hemoglobin A1c in a.m.  - Correct electrolytes as needed - Monitoring for AG closure and will transition to subcutaneous insulin  once able - Diabetes education consulted  Influenza A Patient reports having intermittent cough, chills, nausea, vomiting, and diarrhea for the last 3 days.  Chest x-ray noted no acute abnormality.  Influenza A screening was noted to be positive.  Patient is out of the window for treatment with Tamiflu  at this time as it has been greater than  48 hours since onset of symptoms. -Droplet precautions -Antiemetics as needed  SIRS In the emergency department patient was noted to be tachycardic, tachypneic, and had initial white blood cell count elevated up to 14.7 meeting SIRS criteria.   Suspect secondary to influenza and DKA. -Add on procalcitonin -Follow-up urinalysis  Acute kidney injury superimposed on chronic kidney disease stage IIIb On admission creatinine noted to be 3.59 with BUN 33.  Baseline creatinine previously noted to be around 1.5.  Patient is followed by Dr. Zelda Hickman of nephrology in the outpatient setting. -Monitor I&Os   -Check urinalysis -Continue IV fluids -Continue to monitor kidney function  Normocytic anemia Chronic.  Hemoglobin 11.1 which appears around patient's baseline with normal MCV and MCH.  No reports of bleeding. -Continue to monitor   DVT prophylaxis: Heparin   Advance Care Planning:   Code Status: Full Code    Consults: Diabetic education  Family Communication: Patient's mother updated over the phone.  Severity of Illness: The appropriate patient status for this patient is INPATIENT. Inpatient  status is judged to be reasonable and necessary in order to provide the required intensity of service to ensure the patient's safety. The patient's presenting symptoms, physical exam findings, and initial radiographic and laboratory data in the context of their chronic comorbidities is felt to place them at high risk for further clinical deterioration. Furthermore, it is not anticipated that the patient will be medically stable for discharge from the hospital within 2 midnights of admission.   * I certify that at the point of admission it is my clinical judgment that the patient will require inpatient hospital care spanning beyond 2 midnights from the point of admission due to high intensity of service, high risk for further deterioration and high frequency of surveillance required.*  Author: Lena Qualia, MD 03/14/2023 10:30 AM  For on call review [email or web address].

## 2023-03-14 NOTE — Progress Notes (Signed)
 Urinalysis was noted to have greater than 500 glucose, large hemoglobin, 40 ketones, negative leukocytes, greater than 300 protein, many bacteria, trichomonas present, and 6-10 WBCs.  Urine culture and GC chlamydia ordered.  Patient ordered metronidazole  2000 mg p.o. x 1 dose.  Follow-up urine cultures.

## 2023-03-14 NOTE — Inpatient Diabetes Management (Signed)
 Inpatient Diabetes Program Recommendations  AACE/ADA: New Consensus Statement on Inpatient Glycemic Control (2015)  Target Ranges:  Prepandial:   less than 140 mg/dL      Peak postprandial:   less than 180 mg/dL (1-2 hours)      Critically ill patients:  140 - 180 mg/dL   Lab Results  Component Value Date   GLUCAP 279 (H) 03/14/2023   HGBA1C 10.1 (H) 03/14/2023    Review of Glycemic Control  Diabetes history: DM type 1 (needs basal insulin  and bolus to correction glucose and cover carbohydrate coverage) Outpatient Diabetes medications: Lantus  16 units Daily, Humalog  4-40 units tid Current orders for Inpatient glycemic control:  IV insulin /Endotool  Spoke with pt at bedside regarding DKA admission. Pt reports feeling ill on Friday and was unable to get out of bed. Pt reports no checking her glucose since Friday.Spoke with pt briefly about sick day guidelines. Pt has not seen her Endocrinologist, Dr. Rosalea Collin, since May 06/2022. Pt just got a new job and is waiting for insurance to kick in. Pt did have a CGM at one time, however is currently using fingerstick meter from [employer]. Pt reports having insulin  at home. Discussed A1c level and encouraged follow up.  Inpatient Diabetes Program Recommendations:    At time of transition consider: -   Semglee  16 units -   Novolog  0-9 units Q4 hours -   Novolog  4 units tid meal coverage if eating >50% of meals.  Thanks,  Eloise Hake RN, MSN, BC-ADM Inpatient Diabetes Coordinator Team Pager [phone] (8a-5p)

## 2023-03-15 ENCOUNTER — Other Ambulatory Visit (HOSPITAL_COMMUNITY): Payer: Self-pay

## 2023-03-15 DIAGNOSIS — E1010 Type 1 diabetes mellitus with ketoacidosis without coma: Secondary | ICD-10-CM | POA: Diagnosis not present

## 2023-03-15 LAB — RESPIRATORY PANEL BY PCR

## 2023-03-15 LAB — BASIC METABOLIC PANEL
Anion gap: 10 (ref 5–15)
Anion gap: 12 (ref 5–15)
Anion gap: 14 (ref 5–15)
BUN: 28 mg/dL — ABNORMAL HIGH (ref 6–20)
BUN: 29 mg/dL — ABNORMAL HIGH (ref 6–20)
BUN: 31 mg/dL — ABNORMAL HIGH (ref 6–20)
CO2: 18 mmol/L — ABNORMAL LOW (ref 22–32)
CO2: 18 mmol/L — ABNORMAL LOW (ref 22–32)
CO2: 19 mmol/L — ABNORMAL LOW (ref 22–32)
Calcium: 8.1 mg/dL — ABNORMAL LOW (ref 8.9–10.3)
Calcium: 8.4 mg/dL — ABNORMAL LOW (ref 8.9–10.3)
Calcium: 8.5 mg/dL — ABNORMAL LOW (ref 8.9–10.3)
Chloride: 104 mmol/L (ref 98–111)
Chloride: 107 mmol/L (ref 98–111)
Chloride: 107 mmol/L (ref 98–111)
Creatinine, Ser: 3.02 mg/dL — ABNORMAL HIGH (ref 0.44–1.00)
Creatinine, Ser: 3.03 mg/dL — ABNORMAL HIGH (ref 0.44–1.00)
Creatinine, Ser: 3.05 mg/dL — ABNORMAL HIGH (ref 0.44–1.00)
GFR, Estimated: 21 mL/min — ABNORMAL LOW (ref >60)
GFR, Estimated: 21 mL/min — ABNORMAL LOW (ref >60)
GFR, Estimated: 21 mL/min — ABNORMAL LOW (ref >60)
Glucose, Bld: 175 mg/dL — ABNORMAL HIGH (ref 70–99)
Glucose, Bld: 182 mg/dL — ABNORMAL HIGH (ref 70–99)
Glucose, Bld: 184 mg/dL — ABNORMAL HIGH (ref 70–99)
Potassium: 3.5 mmol/L (ref 3.5–5.1)
Potassium: 3.7 mmol/L (ref 3.5–5.1)
Potassium: 3.8 mmol/L (ref 3.5–5.1)
Sodium: 135 mmol/L (ref 135–145)
Sodium: 136 mmol/L (ref 135–145)
Sodium: 138 mmol/L (ref 135–145)

## 2023-03-15 LAB — GLUCOSE, CAPILLARY
Glucose-Capillary: 173 mg/dL — ABNORMAL HIGH (ref 70–99)
Glucose-Capillary: 174 mg/dL — ABNORMAL HIGH (ref 70–99)
Glucose-Capillary: 179 mg/dL — ABNORMAL HIGH (ref 70–99)
Glucose-Capillary: 173 mg/dL — ABNORMAL HIGH (ref 70–99)
Glucose-Capillary: 159 mg/dL — ABNORMAL HIGH (ref 70–99)
Glucose-Capillary: 152 mg/dL — ABNORMAL HIGH (ref 70–99)
Glucose-Capillary: 177 mg/dL — ABNORMAL HIGH (ref 70–99)
Glucose-Capillary: 155 mg/dL — ABNORMAL HIGH (ref 70–99)
Glucose-Capillary: 200 mg/dL — ABNORMAL HIGH (ref 70–99)
Glucose-Capillary: 162 mg/dL — ABNORMAL HIGH (ref 70–99)
Glucose-Capillary: 162 mg/dL — ABNORMAL HIGH (ref 70–99)
Glucose-Capillary: 128 mg/dL — ABNORMAL HIGH (ref 70–99)
Glucose-Capillary: 124 mg/dL — ABNORMAL HIGH (ref 70–99)
Glucose-Capillary: 104 mg/dL — ABNORMAL HIGH (ref 70–99)

## 2023-03-15 LAB — CBC
HCT: 29.3 % — ABNORMAL LOW (ref 36.0–46.0)
Hemoglobin: 9.8 g/dL — ABNORMAL LOW (ref 12.0–15.0)
MCH: 31.1 pg (ref 26.0–34.0)
MCHC: 33.4 g/dL (ref 30.0–36.0)
MCV: 93 fL (ref 80.0–100.0)
Platelets: 298 10*3/uL (ref 150–400)
RBC: 3.15 MIL/uL — ABNORMAL LOW (ref 3.87–5.11)
RDW: 12.7 % (ref 11.5–15.5)
WBC: 15.8 10*3/uL — ABNORMAL HIGH (ref 4.0–10.5)
nRBC: 0 % (ref 0.0–0.2)

## 2023-03-15 LAB — BETA-HYDROXYBUTYRIC ACID: Beta-Hydroxybutyric Acid: 0.25 mmol/L (ref 0.05–0.27)

## 2023-03-15 MED ORDER — INSULIN ASPART 100 UNIT/ML IJ SOLN: 0.0000 [IU] | Freq: Every day | INTRAMUSCULAR | Status: DC

## 2023-03-15 MED ORDER — POTASSIUM CHLORIDE 10 MEQ/100ML IV SOLN
10.0000 meq | INTRAVENOUS | Status: AC
Start: 2023-03-15 — End: 2023-03-15
  Administered 2023-03-15 (×2): 10 meq via INTRAVENOUS
  Filled 2023-03-15 (×2): qty 100

## 2023-03-15 MED ORDER — OSELTAMIVIR PHOSPHATE 30 MG PO CAPS
30.0000 mg | ORAL_CAPSULE | Freq: Two times a day (BID) | ORAL | Status: DC
  Administered 2023-03-15 – 2023-03-19 (×9): 30 mg via ORAL
  Filled 2023-03-15 (×9): qty 1

## 2023-03-15 MED ORDER — INSULIN ASPART 100 UNIT/ML IJ SOLN
0.0000 [IU] | Freq: Three times a day (TID) | INTRAMUSCULAR | Status: DC
  Administered 2023-03-15 – 2023-03-17 (×5): 2 [IU] via SUBCUTANEOUS
  Administered 2023-03-17: 3 [IU] via SUBCUTANEOUS
  Administered 2023-03-18: 2 [IU] via SUBCUTANEOUS
  Administered 2023-03-19: 3 [IU] via SUBCUTANEOUS
  Administered 2023-03-19: 2 [IU] via SUBCUTANEOUS
  Administered 2023-03-19 – 2023-03-20 (×4): 3 [IU] via SUBCUTANEOUS
  Administered 2023-03-21 (×2): 2 [IU] via SUBCUTANEOUS

## 2023-03-15 MED ORDER — INSULIN GLARGINE-YFGN 100 UNIT/ML ~~LOC~~ SOLN
16.0000 [IU] | Freq: Every day | SUBCUTANEOUS | Status: DC
  Administered 2023-03-15 – 2023-03-21 (×7): 16 [IU] via SUBCUTANEOUS
  Filled 2023-03-15 (×9): qty 0.16

## 2023-03-15 MED ORDER — LACTATED RINGERS IV SOLN: INTRAVENOUS | Status: DC

## 2023-03-15 NOTE — Progress Notes (Signed)
   03/15/23 0911  TOC Brief Assessment  Insurance and Status Reviewed Counselling psychologist Managed)  Patient has primary care physician Yes (Available  Eden Emms, NP)  Home environment has been reviewed From Home  Prior level of function: Independent  Prior/Current Home Services No current home services  Social Drivers of Health Review SDOH reviewed needs interventions (Transportation resources)  Readmission risk has been reviewed Yes (13%)  Transition of care needs no transition of care needs at this time   [facility] will continue to follow patient for any discharge needs. Please place [facility] consult if needed

## 2023-03-15 NOTE — TOC CM/SW Note (Signed)
MATCH MEDICATION ASSISTANCE CARD Pharmacies please call [phone] for claim processing assistance.  Rx BIN: [id number] Rx Group: [id number] Rx PCN: [id number] Relationship Code: 1 Person Code: 01  Patient ID (MRN): [id number]      Patient Name: Mandy Patel    Patient DOB: 1995/07/02    Discharge Date:03/16/2023  Expiration Date:03/23/2023 (must be filled within 7 days of discharge)   Dear Bonita Quin have been approved to have the prescriptions written by your discharging physician filled through our [facility] (Medication Assistance Through [facility]) program. This program allows for a one-time (no refills) 34-day supply of selected medications for a low copay amount.  The copay is $3.00 per prescription. For instance, if you have one prescription, you will pay $3.00; for two prescriptions, you pay $6.00; for three prescriptions, you pay $9.00; and so on. Only certain pharmacies are participating in this program with [facility]. You will need to select one of the pharmacies from the attached lists and take your prescriptions, this letter, and your photo ID to one of the participating pharmacies.  We are excited that you are able to use the [facility] program to get your medications. These prescriptions must be filled within 7 days of hospital discharge or they will no longer be valid for the [facility] program. Should you have any problems with your prescriptions please contact your case management team member at [phone] for [place] or [phone] for [facility].  Thank you, [facility]    [facility] Program Pharmacies [place]. [facility], [facility], [facility]  [facility] [address] [place], [place] [address] [place], [place] [address], [place], [employer] [address], [address], [place] Other [facility] [address] [place], [place] [facility] [address]  [facility] [id number], [place]

## 2023-03-15 NOTE — Plan of Care (Signed)

## 2023-03-15 NOTE — Progress Notes (Signed)
PROGRESS NOTE    Mandy Patel  ZOX:[id number] DOB: 1995/09/01 DOA: 03/14/2023 PCP: Eden Emms, NP   Chief Complaint  Patient presents with   Nausea   Weakness    Brief Narrative:   Mandy Patel is a 28 y.o. female with medical history significant of diabetes mellitus type 1 who presents with nausea and vomiting over the last 3 days.   -Her workup was significant for acute DKA and Influenza A infection    Assessment & Plan:   Principal Problem:   DKA, type 1 (HCC) Active Problems:   Influenza A   SIRS (systemic inflammatory response syndrome) (HCC)   Acute kidney injury superimposed on chronic kidney disease (HCC)   Normocytic anemia    DKA, type I Diabetes mellitus, type II, poorly controlled with hyperglycemia - Patient presents with blood sugar elevated at 586, CO2 11, anion gap 24, and beta hydroxybutyrate acid 7.05.  -A1c 10.1, this admission, was 8.13 March 2022.   -On insulin drip, anion gap has closed, will transition to Semglee 16 units daily and sliding scale.   Sepsis due to Influenza A -Sepsis POA as tachycardic, tachypneic, and had initial white blood cell count elevated up to 14.7 secondary to influenza and DKA. -Continue Tamiflu   Acute kidney injury superimposed on chronic kidney disease stage IIIb -On admission creatinine noted to be 3.59 with BUN 33.  Baseline creatinine previously noted to be around 1.5.  Patient is followed by Dr. Thedore Mins of nephrology in the outpatient setting. -Creatinine remains elevated, avoid nephrotoxic medications and continue with IV fluids  Anemia of chronic kidney disease -Renal function at baseline  Trichomoniasis -UA positive for trichomonas -Received 2 g of p.o. Flagyl on admission   DVT prophylaxis: Heparin Code Status: Full code Family Communication: Discussed with patient, none at bedside Disposition:   Status is: Inpatient    Consultants:  None  Subjective:  She denied any nausea or  vomiting, overnight, asking if she can eat  Objective: Vitals:   03/14/23 2215 03/14/23 2252 03/15/23 0427 03/15/23 0632  BP:  (!) 148/85 133/80   Pulse: (!) 101 (!) 105 (!) 110 98  Resp:  (!) 36 (!) 28 (!) 33  Temp:  99.4 F (37.4 C) (!) 100.6 F (38.1 C) 98.5 F (36.9 C)  TempSrc:  Oral Oral Oral  SpO2: 98% 96% 92% 94%  Weight:      Height:        Intake/Output Summary (Last 24 hours) at 03/15/2023 1114 Last data filed at 03/14/2023 1947 Gross per 24 hour  Intake 1459.78 ml  Output --  Net 1459.78 ml   Filed Weights   03/14/23 0812  Weight: 87.5 kg    Examination:  Awake Alert, Oriented X 3, No new F.N deficits, Normal affect Symmetrical Chest wall movement, CTA posteriorly +ve B.Sounds, Abd Soft, No Cyanosis, Clubbing or edema    Data Reviewed: I have personally reviewed following labs and imaging studies  CBC: Recent Labs  Lab 03/14/23 0759 03/14/23 0824 03/15/23 0439  WBC 14.7*  --  15.8*  NEUTROABS 12.3*  --   --   HGB 11.1* 11.2* 9.8*  HCT 33.3* 33.0* 29.3*  MCV 94.6  --  93.0  PLT 326  --  298    Basic Metabolic Panel: Recent Labs  Lab 03/14/23 1159 03/14/23 1603 03/14/23 2349 03/15/23 0439 03/15/23 0908  NA 135 138 138 136 135  K 4.3 3.8 3.8 3.5 3.7  CL 104 109 107 104  107  CO2 15* 17* 19* 18* 18*  GLUCOSE 438* 194* 182* 175* 184*  BUN 31* 33* 31* 29* 28*  CREATININE 3.15* 3.10* 3.02* 3.03* 3.05*  CALCIUM 8.0* 8.2* 8.4* 8.5* 8.1*    GFR: Estimated Creatinine Clearance: 32.1 mL/min (A) (by C-G formula based on SCr of 3.05 mg/dL (H)).  Liver Function Tests: Recent Labs  Lab 03/14/23 0759  AST 11*  ALT 13  ALKPHOS 94  BILITOT 1.2  PROT 6.9  ALBUMIN 2.8*    CBG: Recent Labs  Lab 03/15/23 0535 03/15/23 0632 03/15/23 0744 03/15/23 0846 03/15/23 0944  GLUCAP 159* 152* 177* 155* 200*     Recent Results (from the past 240 hours)  Resp panel by RT-PCR (RSV, Flu A&B, Covid) Anterior Nasal Swab     Status: Abnormal    Collection Time: 03/14/23  8:00 AM   Specimen: Anterior Nasal Swab  Result Value Ref Range Status   SARS Coronavirus 2 by RT PCR NEGATIVE NEGATIVE Final   Influenza A by PCR POSITIVE (A) NEGATIVE Final   Influenza B by PCR NEGATIVE NEGATIVE Final    Comment: (NOTE) The Xpert Xpress SARS-CoV-2/FLU/RSV plus assay is intended as an aid in the diagnosis of influenza from Nasopharyngeal swab specimens and should not be used as a sole basis for treatment. Nasal washings and aspirates are unacceptable for Xpert Xpress SARS-CoV-2/FLU/RSV testing.  Fact Sheet for Patients: BloggerCourse.com  Fact Sheet for Healthcare Providers: SeriousBroker.it  This test is not yet approved or cleared by the [place] FDA and has been authorized for detection and/or diagnosis of SARS-CoV-2 by FDA under an Emergency Use Authorization (EUA). This EUA will remain in effect (meaning this test can be used) for the duration of the COVID-19 declaration under Section 564(b)(1) of the Act, 21 U.S.C. section 360bbb-3(b)(1), unless the authorization is terminated or revoked.     Resp Syncytial Virus by PCR NEGATIVE NEGATIVE Final    Comment: (NOTE) Fact Sheet for Patients: BloggerCourse.com  Fact Sheet for Healthcare Providers: SeriousBroker.it  This test is not yet approved or cleared by the [place] FDA and has been authorized for detection and/or diagnosis of SARS-CoV-2 by FDA under an Emergency Use Authorization (EUA). This EUA will remain in effect (meaning this test can be used) for the duration of the COVID-19 declaration under Section 564(b)(1) of the Act, 21 U.S.C. section 360bbb-3(b)(1), unless the authorization is terminated or revoked.  Performed at [facility], [address] [place], [place] [address]          Radiology Studies: DG Chest Port 1 View Result Date:  03/14/2023 CLINICAL DATA:  Hypoxia. EXAM: PORTABLE CHEST 1 VIEW COMPARISON:  Chest radiograph dated Jun 23, 2021. FINDINGS: The heart size and mediastinal contours are within normal limits. No focal consolidation, sizeable pleural effusion, or pneumothorax. No acute osseous abnormality. Overlapping telemetry wires. IMPRESSION: No acute cardiopulmonary findings. Electronically Signed   By: Hart Robinsons M.D.   On: 03/14/2023 10:30        Scheduled Meds:  heparin  5,000 Units Subcutaneous Q8H   insulin aspart  0-15 Units Subcutaneous TID WC   insulin aspart  0-5 Units Subcutaneous QHS   insulin glargine-yfgn  16 Units Subcutaneous Daily   oseltamivir  30 mg Oral BID   pantoprazole (PROTONIX) IV  40 mg Intravenous Q24H   sodium chloride flush  3 mL Intravenous Q12H   Continuous Infusions:  insulin 1.5 Units/hr (03/15/23 1111)   lactated ringers 100 mL/hr at 03/15/23  1039     LOS: 1 day     Huey Bienenstock, MD [facility]   To contact the attending provider between 7A-7P or the covering provider during after hours 7P-7A, please log into the web site [email or web address] and access using universal [place] password for that web site. If you do not have the password, please call the hospital operator.  03/15/2023, 11:14 AM

## 2023-03-15 NOTE — Plan of Care (Signed)

## 2023-03-15 NOTE — Inpatient Diabetes Management (Addendum)
Inpatient Diabetes Program Recommendations  AACE/ADA: New Consensus Statement on Inpatient Glycemic Control (2015)  Target Ranges:  Prepandial:   less than 140 mg/dL      Peak postprandial:   less than 180 mg/dL (1-2 hours)      Critically ill patients:  140 - 180 mg/dL   Lab Results  Component Value Date   GLUCAP 200 (H) 03/15/2023   HGBA1C 10.1 (H) 03/14/2023    Review of Glycemic Control  Latest Reference Range & Units 03/15/23 05:35 03/15/23 06:32 03/15/23 07:44 03/15/23 08:46 03/15/23 09:44  Glucose-Capillary 70 - 99 mg/dL 829 (H) 562 (H) 130 (H) 155 (H) 200 (H)   Diabetes history: DM 1-  Outpatient Diabetes medications:  Lantus 16 units daily Humalog 4-40 units tid with meals  Current orders for Inpatient glycemic control:  Novolog 0-15 units tid with meals and HS Semglee 16 units daily  Inpatient Diabetes Program Recommendations:    Note patient transitioning off insulin drip.  DM coordinator briefly spoke to patient on 2/10-A1C is >10%.   Consider reducing Novolog correction to sensitive 0-9 units tid with meals and add Novolog meal coverage 3 units tid with meals. Will follow.   Addendum 1425- spoke to patient at bedside.  Discussed A1C of 10.1%.  She states that this is higher than previously.  She had a recent job change and will be getting new insurance too.  She admits that meal coverage is sometimes a challenge to take consistently.  She is interested in insulin pump.  We briefly discussed a few of the insulin pumps and encouraged her to discuss with her endocrinologist.  She currently is out of CGM- Will need new prescription at discharge.  Per chart review she has not seen Endocrinologist in a year.  She just started a new job in the past 2 weeks and currently does not have active insurance.  Will need to make sure she has insulin at discharge.  Also placed referral for f/u with [facility] pharmacist after discharge.   Thanks,  Lorenza Cambridge, RN, BC-ADM Inpatient Diabetes  Coordinator Pager [phone]  (8a-5p)

## 2023-03-16 ENCOUNTER — Inpatient Hospital Stay (HOSPITAL_COMMUNITY): Payer: Managed Care, Other (non HMO)

## 2023-03-16 DIAGNOSIS — E1010 Type 1 diabetes mellitus with ketoacidosis without coma: Secondary | ICD-10-CM | POA: Diagnosis not present

## 2023-03-16 DIAGNOSIS — D649 Anemia, unspecified: Secondary | ICD-10-CM | POA: Diagnosis not present

## 2023-03-16 DIAGNOSIS — N179 Acute kidney failure, unspecified: Secondary | ICD-10-CM | POA: Diagnosis not present

## 2023-03-16 DIAGNOSIS — N189 Chronic kidney disease, unspecified: Secondary | ICD-10-CM | POA: Diagnosis not present

## 2023-03-16 LAB — GLUCOSE, CAPILLARY
Glucose-Capillary: 143 mg/dL — ABNORMAL HIGH (ref 70–99)
Glucose-Capillary: 149 mg/dL — ABNORMAL HIGH (ref 70–99)
Glucose-Capillary: 109 mg/dL — ABNORMAL HIGH (ref 70–99)
Glucose-Capillary: 141 mg/dL — ABNORMAL HIGH (ref 70–99)

## 2023-03-16 LAB — BASIC METABOLIC PANEL WITH GFR
Anion gap: 15 (ref 5–15)
BUN: 23 mg/dL — ABNORMAL HIGH (ref 6–20)
CO2: 18 mmol/L — ABNORMAL LOW (ref 22–32)
Calcium: 8.3 mg/dL — ABNORMAL LOW (ref 8.9–10.3)
Chloride: 102 mmol/L (ref 98–111)
Creatinine, Ser: 2.78 mg/dL — ABNORMAL HIGH (ref 0.44–1.00)
GFR, Estimated: 23 mL/min — ABNORMAL LOW (ref >60)
Glucose, Bld: 145 mg/dL — ABNORMAL HIGH (ref 70–99)
Potassium: 3.8 mmol/L (ref 3.5–5.1)
Sodium: 135 mmol/L (ref 135–145)

## 2023-03-16 LAB — GC/CHLAMYDIA PROBE AMP (~~LOC~~) NOT AT ARMC
Chlamydia: NEGATIVE
Comment: NEGATIVE
Comment: NORMAL
Neisseria Gonorrhea: NEGATIVE

## 2023-03-16 LAB — CBC
HCT: 32.6 % — ABNORMAL LOW (ref 36.0–46.0)
Hemoglobin: 10.6 g/dL — ABNORMAL LOW (ref 12.0–15.0)
MCH: 31.1 pg (ref 26.0–34.0)
MCHC: 32.5 g/dL (ref 30.0–36.0)
MCV: 95.6 fL (ref 80.0–100.0)
Platelets: 231 K/uL (ref 150–400)
RBC: 3.41 MIL/uL — ABNORMAL LOW (ref 3.87–5.11)
RDW: 12.6 % (ref 11.5–15.5)
WBC: 16 K/uL — ABNORMAL HIGH (ref 4.0–10.5)
nRBC: 0 % (ref 0.0–0.2)

## 2023-03-16 MED ORDER — AMLODIPINE BESYLATE 5 MG PO TABS
5.0000 mg | ORAL_TABLET | Freq: Every day | ORAL | Status: DC
  Administered 2023-03-16 – 2023-03-22 (×7): 5 mg via ORAL
  Filled 2023-03-16 (×7): qty 1

## 2023-03-16 MED ORDER — METOCLOPRAMIDE HCL 5 MG/ML IJ SOLN
10.0000 mg | Freq: Three times a day (TID) | INTRAMUSCULAR | Status: DC
Start: 2023-03-16 — End: 2023-03-22
  Administered 2023-03-16 – 2023-03-22 (×18): 10 mg via INTRAVENOUS
  Filled 2023-03-16 (×18): qty 2

## 2023-03-16 MED ORDER — METOPROLOL SUCCINATE ER 50 MG PO TB24
50.0000 mg | ORAL_TABLET | Freq: Every day | ORAL | Status: DC
  Administered 2023-03-16 – 2023-03-22 (×7): 50 mg via ORAL
  Filled 2023-03-16 (×7): qty 1

## 2023-03-16 MED ORDER — FUROSEMIDE 10 MG/ML IJ SOLN
60.0000 mg | Freq: Once | INTRAMUSCULAR | Status: AC
Start: 2023-03-16 — End: 2023-03-16
  Administered 2023-03-16: 60 mg via INTRAVENOUS
  Filled 2023-03-16: qty 6

## 2023-03-16 MED ORDER — HYDRALAZINE HCL 20 MG/ML IJ SOLN
10.0000 mg | INTRAMUSCULAR | Status: DC | PRN
Start: 2023-03-16 — End: 2023-03-22
  Filled 2023-03-16: qty 1

## 2023-03-16 MED ORDER — LEVALBUTEROL HCL 0.63 MG/3ML IN NEBU: 0.6300 mg | INHALATION_SOLUTION | Freq: Four times a day (QID) | RESPIRATORY_TRACT | Status: DC | PRN

## 2023-03-16 NOTE — Progress Notes (Signed)
PROGRESS NOTE        PATIENT DETAILS Name: Mandy Patel Age: 28 y.o. Sex: female Date of Birth: 1995/05/09 Admit Date: 03/14/2023 Admitting Physician Clydie Braun, MD LKG:[id number], Genene Churn, NP  Brief Summary: Patient is a 28 y.o.  female with history of DM-1-presented with nausea/weakness-found to have DKA with AKI in the setting of influenza A infection.  Significant events: 2/10>> admit to [facility]  Significant studies: 2/10>> CXR: No PNA  Significant microbiology data: 2/10>> influenza PCR: Positive 2/10>> influenza B/RSV/COVID PCR: Negative  Procedures: None  Consults: None  Subjective: Feels nauseous-poor appetite due to nausea  Objective: Vitals: Blood pressure (!) 185/147, pulse (!) 117, temperature 99.4 F (37.4 C), temperature source Oral, resp. rate (!) 46, height 5\' 8"  (1.727 m), weight 87.5 kg, last menstrual period 03/14/2023, SpO2 97%.   Exam: Gen Exam:Alert awake-not in any distress HEENT:atraumatic, normocephalic Chest: B/L clear to auscultation anteriorly CVS:S1S2 regular Abdomen:soft non tender, non distended Extremities:trace edema Neurology: Non focal Skin: no rash  Pertinent Labs/Radiology:    Latest Ref Rng & Units 03/16/2023    4:33 AM 03/15/2023    4:39 AM 03/14/2023    8:24 AM  CBC  WBC 4.0 - 10.5 K/uL 16.0  15.8    Hemoglobin 12.0 - 15.0 g/dL 72.5  9.8  36.6   Hematocrit 36.0 - 46.0 % 32.6  29.3  33.0   Platelets 150 - 400 K/uL 231  298      Lab Results  Component Value Date   NA 135 03/16/2023   K 3.8 03/16/2023   CL 102 03/16/2023   CO2 18 (L) 03/16/2023     Assessment/Plan: DKA Resolved Treated with IVF/IV insulin Transitioned to SQ insulin  AKI on CKD stage IIIb AKI likely hemodynamically mediated-the setting of DKA/poor appetite/ARB Creatinine slowly improving with supportive care Continue gentle hydration and avoid nephrotoxic agents Likely has diabetic nephropathy at baseline-UA with  significant proteinuria for the past 2 year. Repeat electrolytes tomorrow.  SIRS secondary to Influenza A infection CXR-continues to have some dry cough.   Continues to feel weak Continue Tamiflu  Nausea Suspect this may be gastroparesis Small portion meals Starting IV scheduled Reglan Follow-up  DM-1 (A1c 10.1 on 2/10) CBG stable Lantus 16 units+ SSI  Recent Labs    03/15/23 1734 03/15/23 2002 03/16/23 0811  GLUCAP 124* 104* 143*    HTN BP on the higher side Resume metoprolol As needed IV hydralazine ARB on hold due to AKI.  Normocytic anemia Likely due to IVF dilution/acute illness No evidence of blood loss Follow CBC.  BMI: Estimated body mass index is 29.35 kg/m as calculated from the following:   Height as of this encounter: 5\' 8"  (1.727 m).   Weight as of this encounter: 87.5 kg.   Code status:   Code Status: Full Code   DVT Prophylaxis: heparin injection 5,000 Units Start: 03/14/23 1400   Family Communication: None at bedside   Disposition Plan: Status is: Inpatient Remains inpatient appropriate because: Severity of illness   Planned Discharge Destination:Home   Diet: Diet Order             Diet Carb Modified Fluid consistency: Thin; Room service appropriate? Yes  Diet effective now                     Antimicrobial agents:  Anti-infectives (From admission, onward)    Start     Dose/Rate Route Frequency Ordered Stop   03/15/23 0800  oseltamivir (TAMIFLU) capsule 30 mg       Note to Pharmacy: Tamiflu 30 mg BID for CrCl <  60 mL/min   30 mg Oral 2 times daily 03/15/23 0653 03/20/23 0959   03/14/23 1330  metroNIDAZOLE (FLAGYL) tablet 2,000 mg        2,000 mg Oral  Once 03/14/23 1326 03/14/23 1352        MEDICATIONS: Scheduled Meds:  heparin  5,000 Units Subcutaneous Q8H   insulin aspart  0-15 Units Subcutaneous TID WC   insulin aspart  0-5 Units Subcutaneous QHS   insulin glargine-yfgn  16 Units Subcutaneous Daily    metoCLOPramide (REGLAN) injection  10 mg Intravenous TID AC   metoprolol succinate  50 mg Oral Daily   oseltamivir  30 mg Oral BID   pantoprazole (PROTONIX) IV  40 mg Intravenous Q24H   sodium chloride flush  3 mL Intravenous Q12H   Continuous Infusions:  lactated ringers 50 mL/hr at 03/16/23 0755   PRN Meds:.acetaminophen **OR** acetaminophen, albuterol, dextrose, hydrALAZINE, ondansetron (ZOFRAN) IV, traMADol   I have personally reviewed following labs and imaging studies  LABORATORY DATA: CBC: Recent Labs  Lab 03/14/23 0759 03/14/23 0824 03/15/23 0439 03/16/23 0433  WBC 14.7*  --  15.8* 16.0*  NEUTROABS 12.3*  --   --   --   HGB 11.1* 11.2* 9.8* 10.6*  HCT 33.3* 33.0* 29.3* 32.6*  MCV 94.6  --  93.0 95.6  PLT 326  --  298 231    Basic Metabolic Panel: Recent Labs  Lab 03/14/23 1603 03/14/23 2349 03/15/23 0439 03/15/23 0908 03/16/23 0433  NA 138 138 136 135 135  K 3.8 3.8 3.5 3.7 3.8  CL 109 107 104 107 102  CO2 17* 19* 18* 18* 18*  GLUCOSE 194* 182* 175* 184* 145*  BUN 33* 31* 29* 28* 23*  CREATININE 3.10* 3.02* 3.03* 3.05* 2.78*  CALCIUM 8.2* 8.4* 8.5* 8.1* 8.3*    GFR: Estimated Creatinine Clearance: 35.2 mL/min (A) (by C-G formula based on SCr of 2.78 mg/dL (H)).  Liver Function Tests: Recent Labs  Lab 03/14/23 0759  AST 11*  ALT 13  ALKPHOS 94  BILITOT 1.2  PROT 6.9  ALBUMIN 2.8*   Recent Labs  Lab 03/14/23 0759  LIPASE 21   No results for input(s): "AMMONIA" in the last 168 hours.  Coagulation Profile: No results for input(s): "INR", "PROTIME" in the last 168 hours.  Cardiac Enzymes: No results for input(s): "CKTOTAL", "CKMB", "CKMBINDEX", "TROPONINI" in the last 168 hours.  BNP (last 3 results) No results for input(s): "PROBNP" in the last 8760 hours.  Lipid Profile: No results for input(s): "CHOL", "HDL", "LDLCALC", "TRIG", "CHOLHDL", "LDLDIRECT" in the last 72 hours.  Thyroid Function Tests: No results for input(s): "TSH",  "T4TOTAL", "FREET4", "T3FREE", "THYROIDAB" in the last 72 hours.  Anemia Panel: No results for input(s): "VITAMINB12", "FOLATE", "FERRITIN", "TIBC", "IRON", "RETICCTPCT" in the last 72 hours.  Urine analysis:    Component Value Date/Time   COLORURINE YELLOW 03/14/2023 1130   APPEARANCEUR CLOUDY (A) 03/14/2023 1130   LABSPEC 1.020 03/14/2023 1130   PHURINE 6.0 03/14/2023 1130   GLUCOSEU >=500 (A) 03/14/2023 1130   GLUCOSEU >=1000 12/01/2010 1051   HGBUR LARGE (A) 03/14/2023 1130   BILIRUBINUR NEGATIVE 03/14/2023 1130   KETONESUR 40 (A) 03/14/2023 1130   PROTEINUR >300 (A) 03/14/2023 1130  UROBILINOGEN 0.2 12/01/2010 1051   NITRITE NEGATIVE 03/14/2023 1130   LEUKOCYTESUR NEGATIVE 03/14/2023 1130    Sepsis Labs: Lactic Acid, Venous    Component Value Date/Time   LATICACIDVEN 0.9 06/23/2021 0335    MICROBIOLOGY: Recent Results (from the past 240 hours)  Resp panel by RT-PCR (RSV, Flu A&B, Covid) Anterior Nasal Swab     Status: Abnormal   Collection Time: 03/14/23  8:00 AM   Specimen: Anterior Nasal Swab  Result Value Ref Range Status   SARS Coronavirus 2 by RT PCR NEGATIVE NEGATIVE Final   Influenza A by PCR POSITIVE (A) NEGATIVE Final   Influenza B by PCR NEGATIVE NEGATIVE Final    Comment: (NOTE) The Xpert Xpress SARS-CoV-2/FLU/RSV plus assay is intended as an aid in the diagnosis of influenza from Nasopharyngeal swab specimens and should not be used as a sole basis for treatment. Nasal washings and aspirates are unacceptable for Xpert Xpress SARS-CoV-2/FLU/RSV testing.  Fact Sheet for Patients: BloggerCourse.com  Fact Sheet for Healthcare Providers: SeriousBroker.it  This test is not yet approved or cleared by the [place] FDA and has been authorized for detection and/or diagnosis of SARS-CoV-2 by FDA under an Emergency Use Authorization (EUA). This EUA will remain in effect (meaning this test can be used)  for the duration of the COVID-19 declaration under Section 564(b)(1) of the Act, 21 U.S.C. section 360bbb-3(b)(1), unless the authorization is terminated or revoked.     Resp Syncytial Virus by PCR NEGATIVE NEGATIVE Final    Comment: (NOTE) Fact Sheet for Patients: BloggerCourse.com  Fact Sheet for Healthcare Providers: SeriousBroker.it  This test is not yet approved or cleared by the [place] FDA and has been authorized for detection and/or diagnosis of SARS-CoV-2 by FDA under an Emergency Use Authorization (EUA). This EUA will remain in effect (meaning this test can be used) for the duration of the COVID-19 declaration under Section 564(b)(1) of the Act, 21 U.S.C. section 360bbb-3(b)(1), unless the authorization is terminated or revoked.  Performed at [facility], [address], [place], [place] [address]   Respiratory (~20 pathogens) panel by PCR     Status: Abnormal   Collection Time: 03/15/23 12:29 PM   Specimen: Nasopharyngeal Swab; Respiratory  Result Value Ref Range Status   Adenovirus NOT DETECTED NOT DETECTED Final   Coronavirus 229E NOT DETECTED NOT DETECTED Final    Comment: (NOTE) The Coronavirus on the Respiratory Panel, DOES NOT test for the novel  Coronavirus (2019 nCoV)    Coronavirus HKU1 NOT DETECTED NOT DETECTED Final   Coronavirus NL63 NOT DETECTED NOT DETECTED Final   Coronavirus OC43 NOT DETECTED NOT DETECTED Final   Metapneumovirus NOT DETECTED NOT DETECTED Final   Rhinovirus / Enterovirus NOT DETECTED NOT DETECTED Final   Influenza A H1 2009 DETECTED (A) NOT DETECTED Final   Influenza B NOT DETECTED NOT DETECTED Final   Parainfluenza Virus 1 NOT DETECTED NOT DETECTED Final   Parainfluenza Virus 2 NOT DETECTED NOT DETECTED Final   Parainfluenza Virus 3 NOT DETECTED NOT DETECTED Final   Parainfluenza Virus 4 NOT DETECTED NOT DETECTED Final   Respiratory Syncytial Virus NOT DETECTED NOT  DETECTED Final   Bordetella pertussis NOT DETECTED NOT DETECTED Final   Bordetella Parapertussis NOT DETECTED NOT DETECTED Final   Chlamydophila pneumoniae NOT DETECTED NOT DETECTED Final   Mycoplasma pneumoniae NOT DETECTED NOT DETECTED Final    Comment: Performed at [facility], [address] [place], [place] [address]    RADIOLOGY  STUDIES/RESULTS: DG Chest Port 1 View Result Date: 03/14/2023 CLINICAL DATA:  Hypoxia. EXAM: PORTABLE CHEST 1 VIEW COMPARISON:  Chest radiograph dated Jun 23, 2021. FINDINGS: The heart size and mediastinal contours are within normal limits. No focal consolidation, sizeable pleural effusion, or pneumothorax. No acute osseous abnormality. Overlapping telemetry wires. IMPRESSION: No acute cardiopulmonary findings. Electronically Signed   By: Hart Robinsons M.D.   On: 03/14/2023 10:30     LOS: 2 days   Jeoffrey Massed, MD  [facility]    To contact the attending provider between 7A-7P or the covering provider during after hours 7P-7A, please log into the web site [email or web address] and access using universal [place] password for that web site. If you do not have the password, please call the hospital operator.  03/16/2023, 8:21 AM

## 2023-03-16 NOTE — Progress Notes (Signed)
   03/16/23 0800  Assess: MEWS Score  Temp 99.4 F (37.4 C)  BP (!) 185/147  MAP (mmHg) 158  Pulse Rate (!) 117  ECG Heart Rate (!) 120  Resp (!) 46  Level of Consciousness Alert  SpO2 97 %  O2 Device Nasal Cannula  O2 Flow Rate (L/min) 2 L/min  Assess: MEWS Score  MEWS Temp 0  MEWS Systolic 0  MEWS Pulse 2  MEWS RR 3  MEWS LOC 0  MEWS Score 5  MEWS Score Color Red  Assess: if the MEWS score is Yellow or Red  Were vital signs accurate and taken at a resting state? Yes  Does the patient meet 2 or more of the SIRS criteria? Yes  Does the patient have a confirmed or suspected source of infection? Yes  MEWS guidelines implemented  Yes, red  Treat  MEWS Interventions Considered administering scheduled or prn medications/treatments as ordered  Take Vital Signs  Increase Vital Sign Frequency  Red: Q1hr x2, continue Q4hrs until patient remains green for 12hrs  Escalate  MEWS: Escalate Red: Discuss with charge nurse and notify provider. Consider notifying RRT. If remains red for 2 hours consider need for higher level of care  Notify: Charge Nurse/RN  Name of Charge Nurse/RN Notified Lulu, RN  Provider Notification  Provider Name/Title Dr. Jerral Ralph  Date Provider Notified 03/16/23  Time Provider Notified 1051  Method of Notification Page (Secure chat)  Notification Reason Other (Comment) (Red MEWS)  Provider response No new orders  Date of Provider Response 03/16/23  Time of Provider Response 1052  Assess: SIRS CRITERIA  SIRS Temperature  0  SIRS Respirations  1  SIRS Pulse 1  SIRS WBC 1  SIRS Score Sum  3

## 2023-03-17 ENCOUNTER — Inpatient Hospital Stay (HOSPITAL_COMMUNITY): Payer: Managed Care, Other (non HMO)

## 2023-03-17 DIAGNOSIS — J9601 Acute respiratory failure with hypoxia: Secondary | ICD-10-CM

## 2023-03-17 DIAGNOSIS — R0602 Shortness of breath: Secondary | ICD-10-CM

## 2023-03-17 DIAGNOSIS — E1010 Type 1 diabetes mellitus with ketoacidosis without coma: Secondary | ICD-10-CM | POA: Diagnosis not present

## 2023-03-17 DIAGNOSIS — N189 Chronic kidney disease, unspecified: Secondary | ICD-10-CM | POA: Diagnosis not present

## 2023-03-17 DIAGNOSIS — M7989 Other specified soft tissue disorders: Secondary | ICD-10-CM

## 2023-03-17 DIAGNOSIS — J09X2 Influenza due to identified novel influenza A virus with other respiratory manifestations: Secondary | ICD-10-CM

## 2023-03-17 DIAGNOSIS — I272 Pulmonary hypertension, unspecified: Secondary | ICD-10-CM

## 2023-03-17 DIAGNOSIS — J101 Influenza due to other identified influenza virus with other respiratory manifestations: Secondary | ICD-10-CM | POA: Diagnosis not present

## 2023-03-17 DIAGNOSIS — N179 Acute kidney failure, unspecified: Secondary | ICD-10-CM | POA: Diagnosis not present

## 2023-03-17 DIAGNOSIS — D649 Anemia, unspecified: Secondary | ICD-10-CM | POA: Diagnosis not present

## 2023-03-17 LAB — BASIC METABOLIC PANEL
Anion gap: 13 (ref 5–15)
BUN: 22 mg/dL — ABNORMAL HIGH (ref 6–20)
CO2: 20 mmol/L — ABNORMAL LOW (ref 22–32)
Calcium: 7.8 mg/dL — ABNORMAL LOW (ref 8.9–10.3)
Chloride: 102 mmol/L (ref 98–111)
Creatinine, Ser: 2.89 mg/dL — ABNORMAL HIGH (ref 0.44–1.00)
GFR, Estimated: 22 mL/min — ABNORMAL LOW (ref >60)
Glucose, Bld: 163 mg/dL — ABNORMAL HIGH (ref 70–99)
Potassium: 3.5 mmol/L (ref 3.5–5.1)
Sodium: 135 mmol/L (ref 135–145)

## 2023-03-17 LAB — ECHOCARDIOGRAM COMPLETE
Area-P 1/2: 5.54 cm2
Calc EF: 67.7 %
Height: 68 in
S' Lateral: 2.5 cm
Single Plane A2C EF: 65.9 %
Single Plane A4C EF: 71.4 %
Weight: 3088 [oz_av]

## 2023-03-17 LAB — PROCALCITONIN: Procalcitonin: 1.37 ng/mL

## 2023-03-17 LAB — CBC
HCT: 27.2 % — ABNORMAL LOW (ref 36.0–46.0)
Hemoglobin: 8.9 g/dL — ABNORMAL LOW (ref 12.0–15.0)
MCH: 30.8 pg (ref 26.0–34.0)
MCHC: 32.7 g/dL (ref 30.0–36.0)
MCV: 94.1 fL (ref 80.0–100.0)
Platelets: 209 10*3/uL (ref 150–400)
RBC: 2.89 MIL/uL — ABNORMAL LOW (ref 3.87–5.11)
RDW: 12.5 % (ref 11.5–15.5)
WBC: 19.4 10*3/uL — ABNORMAL HIGH (ref 4.0–10.5)
nRBC: 0 % (ref 0.0–0.2)

## 2023-03-17 LAB — GLUCOSE, CAPILLARY
Glucose-Capillary: 184 mg/dL — ABNORMAL HIGH (ref 70–99)
Glucose-Capillary: 138 mg/dL — ABNORMAL HIGH (ref 70–99)
Glucose-Capillary: 87 mg/dL (ref 70–99)
Glucose-Capillary: 68 mg/dL — ABNORMAL LOW (ref 70–99)
Glucose-Capillary: 121 mg/dL — ABNORMAL HIGH (ref 70–99)

## 2023-03-17 LAB — C-REACTIVE PROTEIN: CRP: 12.8 mg/dL — ABNORMAL HIGH (ref <1.0)

## 2023-03-17 LAB — CK: Total CK: 110 U/L (ref 38–234)

## 2023-03-17 LAB — FERRITIN: Ferritin: 156 ng/mL (ref 11–307)

## 2023-03-17 LAB — SEDIMENTATION RATE: Sed Rate: >140 mm/h — ABNORMAL HIGH (ref 0–22)

## 2023-03-17 LAB — BRAIN NATRIURETIC PEPTIDE: B Natriuretic Peptide: 130.2 pg/mL — ABNORMAL HIGH (ref 0.0–100.0)

## 2023-03-17 MED ORDER — SODIUM CHLORIDE 0.9 % IV SOLN
500.0000 mg | INTRAVENOUS | Status: AC
  Administered 2023-03-17 – 2023-03-19 (×3): 500 mg via INTRAVENOUS
  Filled 2023-03-17 (×3): qty 5

## 2023-03-17 MED ORDER — SODIUM CHLORIDE 0.9 % IV SOLN
2.0000 g | INTRAVENOUS | Status: DC
  Administered 2023-03-17 – 2023-03-22 (×6): 2 g via INTRAVENOUS
  Filled 2023-03-17 (×6): qty 20

## 2023-03-17 NOTE — Progress Notes (Addendum)
PROGRESS NOTE        PATIENT DETAILS Name: Maeby Cordelia Poche Age: 28 y.o. Sex: female Date of Birth: [birth year] Admit Date: 03/14/2023 Admitting Physician Clydie Braun, MD ZOX:[id number], Genene Churn, NP  Brief Summary: Patient is a 28 y.o.  female with history of DM-1-presented with nausea/weakness-found to have DKA with AKI in the setting of influenza A infection.  Significant events: 2/10>> admit to [facility]-DKA/AKI/influenza-CXR without pneumonia. 2/12>> worsening SOB-Lasix x 1 dose-CXR with multifocal PNA. 2/13>>Rocephin/zithro started  Significant studies: 2/10>> CXR: No PNA 2/12>> CXR: Multifocal pneumonia 2/13>> echo: EF 65-70%, RVSP 65.7.  Significant microbiology data: 2/10>> influenza A PCR: Positive 2/10>> influenza B/RSV/COVID PCR: Negative  Procedures: None  Consults: None  Subjective: Feels better than yesterday.  Objective: Vitals: Blood pressure (!) 160/92, pulse (!) 105, temperature 99.7 F (37.6 C), temperature source Oral, resp. rate (!) 33, height 5\' 8"  (1.727 m), weight 87.5 kg, last menstrual period 03/14/2023, SpO2 95%.   Exam: Gen Exam:Alert awake-not in any distress HEENT:atraumatic, normocephalic Chest: B/L clear to auscultation anteriorly CVS:S1S2 regular Abdomen:soft non tender, non distended Extremities:trace edema Neurology: Non focal Skin: no rash  Pertinent Labs/Radiology:    Latest Ref Rng & Units 03/17/2023    4:33 AM 03/16/2023    4:33 AM 03/15/2023    4:39 AM  CBC  WBC 4.0 - 10.5 K/uL 19.4  16.0  15.8   Hemoglobin 12.0 - 15.0 g/dL 8.9  54.0  9.8   Hematocrit 36.0 - 46.0 % 27.2  32.6  29.3   Platelets 150 - 400 K/uL 209  231  298     Lab Results  Component Value Date   NA 135 03/17/2023   K 3.5 03/17/2023   CL 102 03/17/2023   CO2 20 (L) 03/17/2023     Assessment/Plan: DKA Resolved Treated with IVF/IV insulin Transitioned to SQ insulin  AKI on CKD stage IIIb AKI likely hemodynamically  mediated-the setting of DKA/poor appetite/ARB Creatinine slowly improving-has now plateaued-watch closely-continue to avoid nephrotoxic agents Volume status stable-stop IVF and watch closely Likely has diabetic nephropathy at baseline-UA with significant proteinuria for the past 2 year.  Recent autoimmune workup by outpatient nephrologist negative. Repeat electrolytes tomorrow.  SIRS secondary to Influenza A infection Multifocal pneumonia Developed SOB on 2/12-repeat x-ray showed multifocal pneumonia Unclear if this is superimposed bacterial infection-obtain cultures-procalcitonin is slightly elevated-starting Rocephin/Zithromax Continue Tamiflu.  Nausea Suspect this may be gastroparesis Has improved after starting small portion meals-IV Reglan.  DM-1 (A1c 10.1 on 2/10) CBG stable Lantus 16 units+ SSI  Recent Labs    03/16/23 1648 03/16/23 2132 03/17/23 0741  GLUCAP 109* 141* 184*    HTN BP improving Continue metoprolol and amlodipine.  Continue to hold ARB  Severe pulmonary hypertension Seen on echocardiogram Unclear etiology-recent autoimmune workup negative, echo with no major LV/valvular issues. Unlikely to have OSA Obtain lower extremity Dopplers-not sure if a VQ scan will be reliable given multifocal infiltrates  Will discuss with pulmonology.  Normocytic anemia Likely due to IVF dilution/acute illness No evidence of blood loss Follow CBC.  BMI: Estimated body mass index is 29.35 kg/m as calculated from the following:   Height as of this encounter: 5\' 8"  (1.727 m).   Weight as of this encounter: 87.5 kg.   Code status:   Code Status: Full Code   DVT Prophylaxis: heparin injection 5,000 Units  Start: 03/14/23 1400   Family Communication: [phone] updated over the phone 2/13   Disposition Plan: Status is: Inpatient Remains inpatient appropriate because: Severity of illness   Planned Discharge Destination:Home   Diet: Diet Order              Diet Carb Modified Fluid consistency: Thin; Room service appropriate? Yes  Diet effective now                     Antimicrobial agents: Anti-infectives (From admission, onward)    Start     Dose/Rate Route Frequency Ordered Stop   03/17/23 0800  cefTRIAXone (ROCEPHIN) 2 g in sodium chloride 0.9 % 100 mL IVPB        2 g 200 mL/hr over 30 Minutes Intravenous Every 24 hours 03/17/23 0653     03/17/23 0800  azithromycin (ZITHROMAX) 500 mg in sodium chloride 0.9 % 250 mL IVPB        500 mg 250 mL/hr over 60 Minutes Intravenous Every 24 hours 03/17/23 0653 03/20/23 0759   03/15/23 0800  oseltamivir (TAMIFLU) capsule 30 mg       Note to Pharmacy: Tamiflu 30 mg BID for CrCl <  60 mL/min   30 mg Oral 2 times daily 03/15/23 0653 03/20/23 0959   03/14/23 1330  metroNIDAZOLE (FLAGYL) tablet 2,000 mg        2,000 mg Oral  Once 03/14/23 1326 03/14/23 1352        MEDICATIONS: Scheduled Meds:  amLODipine  5 mg Oral Daily   heparin  5,000 Units Subcutaneous Q8H   insulin aspart  0-15 Units Subcutaneous TID WC   insulin aspart  0-5 Units Subcutaneous QHS   insulin glargine-yfgn  16 Units Subcutaneous Daily   metoCLOPramide (REGLAN) injection  10 mg Intravenous TID AC   metoprolol succinate  50 mg Oral Daily   oseltamivir  30 mg Oral BID   pantoprazole (PROTONIX) IV  40 mg Intravenous Q24H   sodium chloride flush  3 mL Intravenous Q12H   Continuous Infusions:  azithromycin     cefTRIAXone (ROCEPHIN)  IV     PRN Meds:.acetaminophen **OR** acetaminophen, dextrose, hydrALAZINE, levalbuterol, ondansetron (ZOFRAN) IV, traMADol   I have personally reviewed following labs and imaging studies  LABORATORY DATA: CBC: Recent Labs  Lab 03/14/23 0759 03/14/23 0824 03/15/23 0439 03/16/23 0433 03/17/23 0433  WBC 14.7*  --  15.8* 16.0* 19.4*  NEUTROABS 12.3*  --   --   --   --   HGB 11.1* 11.2* 9.8* 10.6* 8.9*  HCT 33.3* 33.0* 29.3* 32.6* 27.2*  MCV 94.6  --  93.0 95.6 94.1  PLT  326  --  298 231 209    Basic Metabolic Panel: Recent Labs  Lab 03/14/23 2349 03/15/23 0439 03/15/23 0908 03/16/23 0433 03/17/23 0433  NA 138 136 135 135 135  K 3.8 3.5 3.7 3.8 3.5  CL 107 104 107 102 102  CO2 19* 18* 18* 18* 20*  GLUCOSE 182* 175* 184* 145* 163*  BUN 31* 29* 28* 23* 22*  CREATININE 3.02* 3.03* 3.05* 2.78* 2.89*  CALCIUM 8.4* 8.5* 8.1* 8.3* 7.8*    GFR: Estimated Creatinine Clearance: 33.8 mL/min (A) (by C-G formula based on SCr of 2.89 mg/dL (H)).  Liver Function Tests: Recent Labs  Lab 03/14/23 0759  AST 11*  ALT 13  ALKPHOS 94  BILITOT 1.2  PROT 6.9  ALBUMIN 2.8*   Recent Labs  Lab 03/14/23 0759  LIPASE 21  No results for input(s): "AMMONIA" in the last 168 hours.  Coagulation Profile: No results for input(s): "INR", "PROTIME" in the last 168 hours.  Cardiac Enzymes: No results for input(s): "CKTOTAL", "CKMB", "CKMBINDEX", "TROPONINI" in the last 168 hours.  BNP (last 3 results) No results for input(s): "PROBNP" in the last 8760 hours.  Lipid Profile: No results for input(s): "CHOL", "HDL", "LDLCALC", "TRIG", "CHOLHDL", "LDLDIRECT" in the last 72 hours.  Thyroid Function Tests: No results for input(s): "TSH", "T4TOTAL", "FREET4", "T3FREE", "THYROIDAB" in the last 72 hours.  Anemia Panel: No results for input(s): "VITAMINB12", "FOLATE", "FERRITIN", "TIBC", "IRON", "RETICCTPCT" in the last 72 hours.  Urine analysis:    Component Value Date/Time   COLORURINE YELLOW 03/14/2023 1130   APPEARANCEUR CLOUDY (A) 03/14/2023 1130   LABSPEC 1.020 03/14/2023 1130   PHURINE 6.0 03/14/2023 1130   GLUCOSEU >=500 (A) 03/14/2023 1130   GLUCOSEU >=1000 12/01/2010 1051   HGBUR LARGE (A) 03/14/2023 1130   BILIRUBINUR NEGATIVE 03/14/2023 1130   KETONESUR 40 (A) 03/14/2023 1130   PROTEINUR >300 (A) 03/14/2023 1130   UROBILINOGEN 0.2 12/01/2010 1051   NITRITE NEGATIVE 03/14/2023 1130   LEUKOCYTESUR NEGATIVE 03/14/2023 1130    Sepsis  Labs: Lactic Acid, Venous    Component Value Date/Time   LATICACIDVEN 0.9 06/23/2021 0335    MICROBIOLOGY: Recent Results (from the past 240 hours)  Resp panel by RT-PCR (RSV, Flu A&B, Covid) Anterior Nasal Swab     Status: Abnormal   Collection Time: 03/14/23  8:00 AM   Specimen: Anterior Nasal Swab  Result Value Ref Range Status   SARS Coronavirus 2 by RT PCR NEGATIVE NEGATIVE Final   Influenza A by PCR POSITIVE (A) NEGATIVE Final   Influenza B by PCR NEGATIVE NEGATIVE Final    Comment: (NOTE) The Xpert Xpress SARS-CoV-2/FLU/RSV plus assay is intended as an aid in the diagnosis of influenza from Nasopharyngeal swab specimens and should not be used as a sole basis for treatment. Nasal washings and aspirates are unacceptable for Xpert Xpress SARS-CoV-2/FLU/RSV testing.  Fact Sheet for Patients: BloggerCourse.com  Fact Sheet for Healthcare Providers: SeriousBroker.it  This test is not yet approved or cleared by the [place] FDA and has been authorized for detection and/or diagnosis of SARS-CoV-2 by FDA under an Emergency Use Authorization (EUA). This EUA will remain in effect (meaning this test can be used) for the duration of the COVID-19 declaration under Section 564(b)(1) of the Act, 21 U.S.C. section 360bbb-3(b)(1), unless the authorization is terminated or revoked.     Resp Syncytial Virus by PCR NEGATIVE NEGATIVE Final    Comment: (NOTE) Fact Sheet for Patients: BloggerCourse.com  Fact Sheet for Healthcare Providers: SeriousBroker.it  This test is not yet approved or cleared by the [place] FDA and has been authorized for detection and/or diagnosis of SARS-CoV-2 by FDA under an Emergency Use Authorization (EUA). This EUA will remain in effect (meaning this test can be used) for the duration of the COVID-19 declaration under Section 564(b)(1) of the Act, 21  U.S.C. section 360bbb-3(b)(1), unless the authorization is terminated or revoked.  Performed at [facility], [address] [place], [place] [address]   Respiratory (~20 pathogens) panel by PCR     Status: Abnormal   Collection Time: 03/15/23 12:29 PM   Specimen: Nasopharyngeal Swab; Respiratory  Result Value Ref Range Status   Adenovirus NOT DETECTED NOT DETECTED Final   Coronavirus 229E NOT DETECTED NOT DETECTED Final    Comment: (NOTE) The Coronavirus on the  Respiratory Panel, DOES NOT test for the novel  Coronavirus (2019 nCoV)    Coronavirus HKU1 NOT DETECTED NOT DETECTED Final   Coronavirus NL63 NOT DETECTED NOT DETECTED Final   Coronavirus OC43 NOT DETECTED NOT DETECTED Final   Metapneumovirus NOT DETECTED NOT DETECTED Final   Rhinovirus / Enterovirus NOT DETECTED NOT DETECTED Final   Influenza A H1 2009 DETECTED (A) NOT DETECTED Final   Influenza B NOT DETECTED NOT DETECTED Final   Parainfluenza Virus 1 NOT DETECTED NOT DETECTED Final   Parainfluenza Virus 2 NOT DETECTED NOT DETECTED Final   Parainfluenza Virus 3 NOT DETECTED NOT DETECTED Final   Parainfluenza Virus 4 NOT DETECTED NOT DETECTED Final   Respiratory Syncytial Virus NOT DETECTED NOT DETECTED Final   Bordetella pertussis NOT DETECTED NOT DETECTED Final   Bordetella Parapertussis NOT DETECTED NOT DETECTED Final   Chlamydophila pneumoniae NOT DETECTED NOT DETECTED Final   Mycoplasma pneumoniae NOT DETECTED NOT DETECTED Final    Comment: Performed at [facility], [address] [place], [place] [address]    RADIOLOGY STUDIES/RESULTS: ECHOCARDIOGRAM COMPLETE Result Date: 03/17/2023    ECHOCARDIOGRAM REPORT   Patient Name:   Krysia A Cottam Date of Exam: 03/17/2023 Medical Rec #:  [id number]       Height:       68.0 in Accession #:    [id number]      Weight:       193.0 lb Date of Birth:  October 26, 1995      BSA:          2.013 m Patient Age:    27 years        BP:           143/89 mmHg Patient Gender:  F               HR:           108 bpm. Exam Location:  Inpatient Procedure: 2D Echo, Cardiac Doppler and Color Doppler (Both Spectral and Color            Flow Doppler were utilized during procedure). Indications:    R06.02 SOB  History:        Patient has no prior history of Echocardiogram examinations.                 Signs/Symptoms:Shortness of Breath; Risk Factors:Diabetes and                 Dyslipidemia. FLU positive.  Sonographer:    Sheralyn Boatman RDCS Referring Phys: [id number] [facility]  Sonographer Comments: Technically difficult study due to poor echo windows. IMPRESSIONS  1. Left ventricular ejection fraction, by estimation, is 65 to 70%. The left ventricle has normal function. The left ventricle has no regional wall motion abnormalities. Left ventricular diastolic parameters were normal.  2. Right ventricular systolic function is normal. The right ventricular size is mildly enlarged. There is severely elevated pulmonary artery systolic pressure. The estimated right ventricular systolic pressure is 65.7 mmHg.  3. The mitral valve is normal in structure. No evidence of mitral valve regurgitation.  4. The aortic valve is normal in structure. Aortic valve regurgitation is not visualized. FINDINGS  Left Ventricle: Left ventricular ejection fraction, by estimation, is 65 to 70%. The left ventricle has normal function. The left ventricle has no regional wall motion abnormalities. Strain imaging was not performed. The left ventricular internal cavity  size was normal in size. There is no left ventricular hypertrophy. Left ventricular  diastolic parameters were normal. Right Ventricle: The right ventricular size is mildly enlarged. No increase in right ventricular wall thickness. Right ventricular systolic function is normal. There is severely elevated pulmonary artery systolic pressure. The tricuspid regurgitant velocity is 3.56 m/s, and with an assumed right atrial pressure of 15 mmHg, the estimated right  ventricular systolic pressure is 65.7 mmHg. Left Atrium: Left atrial size was normal in size. Right Atrium: Right atrial size was normal in size. Pericardium: There is no evidence of pericardial effusion. Mitral Valve: The mitral valve is normal in structure. No evidence of mitral valve regurgitation. Tricuspid Valve: The tricuspid valve is grossly normal. Tricuspid valve regurgitation is mild. Aortic Valve: The aortic valve is normal in structure. There is mild aortic valve annular calcification. Aortic valve regurgitation is not visualized. Pulmonic Valve: The pulmonic valve was not well visualized. Pulmonic valve regurgitation is trivial. Aorta: The aortic root and ascending aorta are structurally normal, with no evidence of dilitation. IAS/Shunts: No atrial level shunt detected by color flow Doppler. Additional Comments: 3D imaging was not performed.  LEFT VENTRICLE PLAX 2D LVIDd:         4.50 cm     Diastology LVIDs:         2.50 cm     LV e' medial:    9.64 cm/s LV PW:         1.00 cm     LV E/e' medial:  8.9 LV IVS:        1.00 cm     LV e' lateral:   19.60 cm/s LVOT diam:     2.20 cm     LV E/e' lateral: 4.4 LV SV:         67 LV SV Index:   33 LVOT Area:     3.80 cm  LV Volumes (MOD) LV vol d, MOD A2C: 76.0 ml LV vol d, MOD A4C: 75.9 ml LV vol s, MOD A2C: 25.9 ml LV vol s, MOD A4C: 21.7 ml LV SV MOD A2C:     50.1 ml LV SV MOD A4C:     75.9 ml LV SV MOD BP:      51.7 ml RIGHT VENTRICLE             IVC RV S prime:     16.90 cm/s  IVC diam: 1.80 cm TAPSE (M-mode): 2.2 cm LEFT ATRIUM             Index        RIGHT ATRIUM           Index LA diam:        3.70 cm 1.84 cm/m   RA Area:     11.30 cm LA Vol (A2C):   19.6 ml 9.74 ml/m   RA Volume:   26.00 ml  12.92 ml/m LA Vol (A4C):   25.1 ml 12.47 ml/m LA Biplane Vol: 23.0 ml 11.43 ml/m  AORTIC VALVE LVOT Vmax:   117.00 cm/s LVOT Vmean:  78.400 cm/s LVOT VTI:    0.176 m  AORTA Ao Root diam: 2.90 cm Ao Asc diam:  2.80 cm MITRAL VALVE               TRICUSPID  VALVE MV Area (PHT): 5.54 cm    TR Peak grad:   50.7 mmHg MV Decel Time: 137 msec    TR Vmax:        356.00 cm/s MV E velocity: 85.50 cm/s MV A velocity: 71.00 cm/s  SHUNTS  MV E/A ratio:  1.20        Systemic VTI:  0.18 m                            Systemic Diam: 2.20 cm Aditya Sabharwal Electronically signed by Dorthula Nettles Signature Date/Time: 03/17/2023/9:52:51 AM    Final    DG Chest Port 1V same Day Result Date: 03/16/2023 CLINICAL DATA:  Shortness of breath. Nausea and weakness. Influenza infection. EXAM: PORTABLE CHEST 1 VIEW COMPARISON:  03/14/2023. FINDINGS: The heart is mildly enlarged and mediastinal contours are within normal limits. Diffuse scattered airspace opacities are noted in the lungs bilaterally. No effusion or pneumothorax is seen. No acute osseous abnormality. IMPRESSION: Diffuse patchy airspace opacities in the lungs bilaterally, concerning for multifocal pneumonia. Electronically Signed   By: Thornell Sartorius M.D.   On: 03/16/2023 19:38     LOS: 3 days   Jeoffrey Massed, MD  [facility]    To contact the attending provider between 7A-7P or the covering provider during after hours 7P-7A, please log into the web site [email or web address] and access using universal [place] password for that web site. If you do not have the password, please call the hospital operator.  03/17/2023, 10:00 AM

## 2023-03-17 NOTE — Progress Notes (Signed)
Bilateral lower extremity venous duplex has been completed. Preliminary results can be found in CV Proc through chart review.   03/17/23 1:31 PM Olen Cordial RVT

## 2023-03-17 NOTE — Consult Note (Signed)
NAME:  Mandy Patel, MRN:  [id number], DOB:  Mar 15, 1995, LOS: 3 ADMISSION DATE:  03/14/2023, CONSULTATION DATE:  03/17/23 REFERRING MD:  ghimire ([facility]), CHIEF COMPLAINT:  pulm htn    History of Present Illness:  27yo female with hx CKD3, DM1 admitted 2/10 with DKA in setting fluA infection. She developed worsening dyspnea and concern for PNA on CXR. Echo was obtained which revealed severely elevated PA pressure and pulmonary was consulted.   Pertinent  Medical History   has a past medical history of DKA (diabetic ketoacidosis) (HCC), Pyelonephritis (03/2017), and Type 1 diabetes (HCC).   Significant Hospital Events: Including procedures, antibiotic start and stop dates in addition to other pertinent events   2D echo 2/13>>> EF 65-70%, severely elevated PASP with estimated RVSP 65.38mmHg , mild TR.   Interim History / Subjective:  DKA resolved, transitioned to SQ insulin.  Denies chest pain or overt SOB. Feels "tired".   Objective   Blood pressure (!) 160/92, pulse (!) 105, temperature 99.7 F (37.6 C), temperature source Oral, resp. rate (!) 33, height 5\' 8"  (1.727 m), weight 87.5 kg, last menstrual period 03/14/2023, SpO2 95%.        Intake/Output Summary (Last 24 hours) at 03/17/2023 1146 Last data filed at 03/17/2023 0504 Gross per 24 hour  Intake 1415.63 ml  Output 1900 ml  Net -484.37 ml   Filed Weights   03/14/23 8295  Weight: 87.5 kg    Examination: General: wdwn young female, ill appearing but NAD in bed HENT: mm moist, no JVD  Lungs: resps even non labored on 2L [place], few scattered rhonchi otherwise clear  Cardiovascular: s1s2 rrr Abdomen: soft, non tender  Extremities: warm and dry, no edema  Neuro: awake, alert, appropriate, MAE   Resolved Hospital Problem list     Assessment & Plan:  Suspect severe pulmonary HTN by echo - unclear etiology. Recent autoimmune w/u by nephrology negative. HIV neg. No sig valvular abnormalities on echo. Not obese, nothing to  suggest OSA.   FluA with multifocal PNA  AKI on CKD - improving  DM with DKA (resolved) P:  BLE venous dopplers pending (holding off on VQ with multifocal infiltrates) Supplemental O2 as needed to keep sats >92%  BP control with B blocker and amlodipine  Consider additional autoimmune w/u - reviewed previous with NEG ANA, c-ANCA, p-ANCA but no RF, anti-scl70, CRP Will d/w Dr. Isaiah Serge   Best Practice (right click and "Reselect all SmartList Selections" daily)   Per primary   Labs   CBC: Recent Labs  Lab 03/14/23 0759 03/14/23 0824 03/15/23 0439 03/16/23 0433 03/17/23 0433  WBC 14.7*  --  15.8* 16.0* 19.4*  NEUTROABS 12.3*  --   --   --   --   HGB 11.1* 11.2* 9.8* 10.6* 8.9*  HCT 33.3* 33.0* 29.3* 32.6* 27.2*  MCV 94.6  --  93.0 95.6 94.1  PLT 326  --  298 231 209    Basic Metabolic Panel: Recent Labs  Lab 03/14/23 2349 03/15/23 0439 03/15/23 0908 03/16/23 0433 03/17/23 0433  NA 138 136 135 135 135  K 3.8 3.5 3.7 3.8 3.5  CL 107 104 107 102 102  CO2 19* 18* 18* 18* 20*  GLUCOSE 182* 175* 184* 145* 163*  BUN 31* 29* 28* 23* 22*  CREATININE 3.02* 3.03* 3.05* 2.78* 2.89*  CALCIUM 8.4* 8.5* 8.1* 8.3* 7.8*   GFR: Estimated Creatinine Clearance: 33.8 mL/min (A) (by C-G formula based on SCr of 2.89 mg/dL (H)). Recent  Labs  Lab 03/14/23 0759 03/14/23 1159 03/15/23 0439 03/16/23 0433 03/17/23 0433  PROCALCITON  --  0.35  --   --  1.37  WBC 14.7*  --  15.8* 16.0* 19.4*    Liver Function Tests: Recent Labs  Lab 03/14/23 0759  AST 11*  ALT 13  ALKPHOS 94  BILITOT 1.2  PROT 6.9  ALBUMIN 2.8*   Recent Labs  Lab 03/14/23 0759  LIPASE 21   No results for input(s): "AMMONIA" in the last 168 hours.  ABG    Component Value Date/Time   PHART 7.167 (LL) 03/06/2017 0912   PCO2ART 24.7 (L) 03/06/2017 0912   PO2ART 31.0 (LL) 03/06/2017 0912   HCO3 14.1 (L) 03/14/2023 0824   TCO2 15 (L) 03/14/2023 0824   ACIDBASEDEF 12.0 (H) 03/14/2023 0824   O2SAT 75  03/14/2023 0824     Coagulation Profile: No results for input(s): "INR", "PROTIME" in the last 168 hours.  Cardiac Enzymes: No results for input(s): "CKTOTAL", "CKMB", "CKMBINDEX", "TROPONINI" in the last 168 hours.  HbA1C: Hemoglobin A1C  Date/Time Value Ref Range Status  03/17/2022 08:29 AM 8.1 (A) 4.0 - 5.6 % Final  12/31/2021 03:48 PM 8.6 (A) 4.0 - 5.6 % Final   Hgb A1c MFr Bld  Date/Time Value Ref Range Status  03/14/2023 07:59 AM 10.1 (H) 4.8 - 5.6 % Final    Comment:    (NOTE) Pre diabetes:          5.7%-6.4%  Diabetes:              >6.4%  Glycemic control for   <7.0% adults with diabetes   06/23/2021 11:08 AM 12.4 (H) 4.8 - 5.6 % Final    Comment:    (NOTE) Pre diabetes:          5.7%-6.4%  Diabetes:              >6.4%  Glycemic control for   <7.0% adults with diabetes     CBG: Recent Labs  Lab 03/16/23 1203 03/16/23 1648 03/16/23 2132 03/17/23 0741 03/17/23 1133  GLUCAP 149* 109* 141* 184* 138*    Review of Systems:   As per HPI - All other systems reviewed and were neg.    Past Medical History:  She,  has a past medical history of DKA (diabetic ketoacidosis) (HCC), Pyelonephritis (03/2017), and Type 1 diabetes (HCC).   Surgical History:   Past Surgical History:  Procedure Laterality Date   EYE MUSCLE SURGERY  approx 2010   bilat eye surgury, left exotropia worse;Dr Maple Hudson, opthomology     Social History:   reports that she has never smoked. She has been exposed to tobacco smoke. She has never used smokeless tobacco. She reports current drug use. Drug: Marijuana. She reports that she does not drink alcohol.   Family History:  Her family history includes Alcohol abuse in her maternal grandfather; Arthritis in her paternal grandmother; COPD in her maternal grandfather; Diabetes in her maternal grandmother, mother, and sister; Hypertension in her father; Kidney disease in her sister; Stroke in her maternal grandmother.   Allergies Allergies   Allergen Reactions   Peanut Oil Other (See Comments)    Reaction to peanuts - gums feel numb and tingling   Other Swelling    Pecans caused throat swelling and weakness     Home Medications  Prior to Admission medications   Medication Sig Start Date End Date Taking? Authorizing Provider  atorvastatin (LIPITOR) 10 MG tablet Take 10 mg by  mouth daily. 01/27/23  Yes [provider]  furosemide (LASIX) 20 MG tablet TAKE 1 TABLET(20 MG) BY MOUTH DAILY AS NEEDED Patient taking differently: Take 20 mg by mouth daily as needed for fluid (ankle). 07/15/22  Yes Eden Emms, NP  ibuprofen (ADVIL) 200 MG tablet Take 400 mg by mouth daily as needed for mild pain (pain score 1-3).   Yes [provider]  insulin glargine (LANTUS SOLOSTAR) 100 UNIT/ML Solostar Pen Inject 14 Units into the skin at bedtime. Patient taking differently: Inject 16 Units into the skin at bedtime. 03/17/22  Yes Shamleffer, Konrad Dolores, MD  insulin lispro (HUMALOG) 100 UNIT/ML KwikPen Max daily 40 units Patient taking differently: 4-40 Units See admin instructions. Patient is taking 4-40 Units 3 (three) times daily. Max daily 40 units depending on her meals. 03/17/22  Yes Shamleffer, Konrad Dolores, MD  losartan (COZAAR) 25 MG tablet TAKE 1 TABLET(25 MG) BY MOUTH DAILY 01/17/23  Yes Eden Emms, NP  metoCLOPramide (REGLAN) 5 MG tablet Take 1 tablet (5 mg total) by mouth every 8 (eight) hours as needed for nausea. 01/04/23  Yes Eden Emms, NP  metoprolol succinate (TOPROL-XL) 50 MG 24 hr tablet TAKE 1 TABLET(50 MG) BY MOUTH DAILY WITH OR IMMEDIATELY FOLLOWING A MEAL 10/25/22  Yes Eden Emms, NP  Multiple Vitamins-Minerals (MULTIPLE VITAMINS/WOMENS PO) Take 1 tablet by mouth 2 (two) times daily.   Yes [provider]  sodium bicarbonate 650 MG tablet Take 1,300 mg by mouth 2 (two) times daily. 02/04/23  Yes [provider]  Vitamin D, Ergocalciferol, 50 MCG (2000 UT) CAPS Take 50 mcg by  mouth in the morning.   Yes [provider]  blood glucose meter kit and supplies KIT Dispense based on patient and insurance preference. Use up to four times daily as directed. (FOR ICD-9 250.00, 250.01). Check sugars before meals 06/25/21   Alford Highland, MD  Continuous Blood Gluc Sensor (DEXCOM G7 SENSOR) MISC Change sensors every 10 days 04/27/22   Shamleffer, Konrad Dolores, MD  Insulin Pen Needle 31G X 5 MM MISC 1 Device by Does not apply route in the morning, at noon, in the evening, and at bedtime. 03/17/22   Shamleffer, Konrad Dolores, MD       Dirk Dress, NP Pulmonary/Critical Care Medicine  03/17/2023  11:46 AM   See Loretha Stapler for personal pager PCCM on call pager [phone] until 7pm. Please call Elink 7p-7a. [phone]

## 2023-03-17 NOTE — Plan of Care (Cosign Needed)
On 03/17/23 I provided care for Mandy Patel, 28 year old african [place] female. She has medical history of Type 1 Diabetes, CKD, and hypertension. Upon admission her chief complaint was nausea and vomiting.  Pt. Is AAOx4, slightly tachpneic and slight wheezing in the upper right lung. Pt stated no SOB or pain. Pt vomited and was given 4 mg Zofran. Pt was assisted from bed to chair and stated no pain. Pt has slight weakness.

## 2023-03-17 NOTE — Progress Notes (Signed)
  Echocardiogram 2D Echocardiogram has been performed.  Mandy Patel 03/17/2023, 9:44 AM

## 2023-03-17 NOTE — Consult Note (Signed)
Advanced Heart Failure Team Consult Note   Primary Physician: Eden Emms, NP Cardiologist:  None  Reason for Consultation: pulmonary HTN  HPI:    Mandy Patel is seen today for evaluation of pulmonary HTN at the request of Dr. Isaiah Serge, PCCM.   28 y/o female w/ poorly controlled Type 1DM (Hgb A1c 10) and CKD III, who presented w/ weakness, new dyspnea and LEE. Found to be hypoxic w/ new O2 requirements. Found to be Flu A+ and also in DKA and w/ AKI w/ admit SCr of 3.6 (b/l ~1.5). CXR c/w multifocal PNA.   She was treated for DKA and placed on insulin gtt, now improved. AKI also improving, SCr down from 3.6>>2.89 today.   Pt remains on 2L [place]. She still feels tired and weak. Getting abx for ? Bacterial PNA. On Tamiflu.   Echo was done today showing normal LVEF 65-70%, RV normal but w/ severely elevated RVSP 66 mmHg. AHF team asked to help further evaluate.      2D Echo 03/17/23 1. Left ventricular ejection fraction, by estimation, is 65 to 70%. The  left ventricle has normal function. The left ventricle has no regional  wall motion abnormalities. Left ventricular diastolic parameters were  normal.   2. Right ventricular systolic function is normal. The right ventricular  size is mildly enlarged. There is severely elevated pulmonary artery  systolic pressure. The estimated right ventricular systolic pressure is  65.7 mmHg. The tricuspid regurgitant  velocity is 3.56 m/s, and with an assumed right atrial pressure of 15   3. The mitral valve is normal in structure. No evidence of mitral valve  regurgitation.   4. The aortic valve is normal in structure. Aortic valve regurgitation is  not visualized.     Home Medications Prior to Admission medications   Medication Sig Start Date End Date Taking? Authorizing Provider  atorvastatin (LIPITOR) 10 MG tablet Take 10 mg by mouth daily. 01/27/23  Yes [provider]  furosemide (LASIX) 20 MG tablet TAKE 1 TABLET(20 MG)  BY MOUTH DAILY AS NEEDED Patient taking differently: Take 20 mg by mouth daily as needed for fluid (ankle). 07/15/22  Yes Eden Emms, NP  ibuprofen (ADVIL) 200 MG tablet Take 400 mg by mouth daily as needed for mild pain (pain score 1-3).   Yes [provider]  insulin glargine (LANTUS SOLOSTAR) 100 UNIT/ML Solostar Pen Inject 14 Units into the skin at bedtime. Patient taking differently: Inject 16 Units into the skin at bedtime. 03/17/22  Yes Shamleffer, Konrad Dolores, MD  insulin lispro (HUMALOG) 100 UNIT/ML KwikPen Max daily 40 units Patient taking differently: 4-40 Units See admin instructions. Patient is taking 4-40 Units 3 (three) times daily. Max daily 40 units depending on her meals. 03/17/22  Yes Shamleffer, Konrad Dolores, MD  losartan (COZAAR) 25 MG tablet TAKE 1 TABLET(25 MG) BY MOUTH DAILY 01/17/23  Yes Eden Emms, NP  metoCLOPramide (REGLAN) 5 MG tablet Take 1 tablet (5 mg total) by mouth every 8 (eight) hours as needed for nausea. 01/04/23  Yes Eden Emms, NP  metoprolol succinate (TOPROL-XL) 50 MG 24 hr tablet TAKE 1 TABLET(50 MG) BY MOUTH DAILY WITH OR IMMEDIATELY FOLLOWING A MEAL 10/25/22  Yes Eden Emms, NP  Multiple Vitamins-Minerals (MULTIPLE VITAMINS/WOMENS PO) Take 1 tablet by mouth 2 (two) times daily.   Yes [provider]  sodium bicarbonate 650 MG tablet Take 1,300 mg by mouth 2 (two) times daily. 02/04/23  Yes [provider]  Vitamin D, Ergocalciferol, 50 MCG (2000 UT) CAPS Take 50 mcg by mouth in the morning.   Yes [provider]  blood glucose meter kit and supplies KIT Dispense based on patient and insurance preference. Use up to four times daily as directed. (FOR ICD-9 250.00, 250.01). Check sugars before meals 06/25/21   Alford Highland, MD  Continuous Blood Gluc Sensor (DEXCOM G7 SENSOR) MISC Change sensors every 10 days 04/27/22   Shamleffer, Konrad Dolores, MD  Insulin Pen Needle 31G X 5 MM MISC 1 Device by Does not  apply route in the morning, at noon, in the evening, and at bedtime. 03/17/22   Shamleffer, Konrad Dolores, MD    Past Medical History: Past Medical History:  Diagnosis Date   DKA (diabetic ketoacidosis) (HCC)    Pyelonephritis 03/2017   Type 1 diabetes (HCC)     diagnosed age 28yo    Past Surgical History: Past Surgical History:  Procedure Laterality Date   EYE MUSCLE SURGERY  approx 2010   bilat eye surgury, left exotropia worse;Dr Maple Hudson, opthomology    Family History: Family History  Problem Relation Age of Onset   Diabetes Mother        type 2   Hypertension Father    Kidney disease Sister        had kidney transplant.   Diabetes Sister        type 1   Stroke Maternal Grandmother    Diabetes Maternal Grandmother    COPD Maternal Grandfather    Alcohol abuse Maternal Grandfather    Arthritis Paternal Grandmother     Social History: Social History   Socioeconomic History   Marital status: Single    Spouse name: Not on file   Number of children: 0   Years of education: Not on file   Highest education level: High school graduate  Occupational History   Occupation: Dentist: UNEMPLOYED  Tobacco Use   Smoking status: Never    Passive exposure: Past   Smokeless tobacco: Never  Vaping Use   Vaping status: Never Used  Substance and Sexual Activity   Alcohol use: No   Drug use: Yes    Types: Marijuana    Comment: occasionally states on the weekends   Sexual activity: Yes    Birth control/protection: None  Other Topics Concern   Not on file  Social History Narrative   Fulltime: [employer]   Social Drivers of Health   Financial Resource Strain: Not on file  Food Insecurity: No Food Insecurity (03/14/2023)   Hunger Vital Sign    Worried About Running Out of Food in the Last Year: Never true    Ran Out of Food in the Last Year: Never true  Transportation Needs: Unmet Transportation Needs (03/14/2023)   PRAPARE - Scientist, research (physical sciences) (Medical): Yes    Lack of Transportation (Non-Medical): Yes  Physical Activity: Not on file  Stress: Not on file  Social Connections: Not on file    Allergies:  Allergies  Allergen Reactions   Peanut Oil Other (See Comments)    Reaction to peanuts - gums feel numb and tingling   Other Swelling    Pecans caused throat swelling and weakness    Objective:    Vital Signs:   Temp:  [98.5 F (36.9 C)-100.2 F (37.9 C)] 99.7 F (37.6 C) (02/13 1135) Pulse Rate:  [88-111] 105 (02/13 0741) Resp:  [18-37] 33 (02/13 0741) BP: (  120-164)/(74-94) 160/92 (02/13 0741) SpO2:  [89 %-97 %] 95 % (02/13 0741)    Weight change: Filed Weights   03/14/23 0812  Weight: 87.5 kg    Intake/Output:   Intake/Output Summary (Last 24 hours) at 03/17/2023 1548 Last data filed at 03/17/2023 1259 Gross per 24 hour  Intake 1365.63 ml  Output 1900 ml  Net -534.37 ml      Physical Exam    General:  fatigued appearing young female. On 2L [place] but no increased WOB  HEENT: normal Neck: supple. JVP 10 cm . Carotids 2+ bilat; no bruits. No lymphadenopathy or thyromegaly appreciated. Cor: PMI nondisplaced. Regular rhythm and tachy rate. No rubs, gallops or murmurs. Lungs: course, rhonchourus bilaterally  Abdomen: soft, nontender, nondistended. No hepatosplenomegaly. No bruits or masses. Good bowel sounds. Extremities: no cyanosis, clubbing, rash, edema Neuro: alert & orientedx3, cranial nerves grossly intact. moves all 4 extremities w/o difficulty. Affect pleasant   Telemetry   Sinus tach 110s, personally reviewed   EKG    Admit Sinus tach 121 bpm, personally reviewed   Labs   Basic Metabolic Panel: Recent Labs  Lab 03/14/23 2349 03/15/23 0439 03/15/23 0908 03/16/23 0433 03/17/23 0433  NA 138 136 135 135 135  K 3.8 3.5 3.7 3.8 3.5  CL 107 104 107 102 102  CO2 19* 18* 18* 18* 20*  GLUCOSE 182* 175* 184* 145* 163*  BUN 31* 29* 28* 23* 22*  CREATININE 3.02* 3.03* 3.05*  2.78* 2.89*  CALCIUM 8.4* 8.5* 8.1* 8.3* 7.8*    Liver Function Tests: Recent Labs  Lab 03/14/23 0759  AST 11*  ALT 13  ALKPHOS 94  BILITOT 1.2  PROT 6.9  ALBUMIN 2.8*   Recent Labs  Lab 03/14/23 0759  LIPASE 21   No results for input(s): "AMMONIA" in the last 168 hours.  CBC: Recent Labs  Lab 03/14/23 0759 03/14/23 0824 03/15/23 0439 03/16/23 0433 03/17/23 0433  WBC 14.7*  --  15.8* 16.0* 19.4*  NEUTROABS 12.3*  --   --   --   --   HGB 11.1* 11.2* 9.8* 10.6* 8.9*  HCT 33.3* 33.0* 29.3* 32.6* 27.2*  MCV 94.6  --  93.0 95.6 94.1  PLT 326  --  298 231 209    Cardiac Enzymes: No results for input(s): "CKTOTAL", "CKMB", "CKMBINDEX", "TROPONINI" in the last 168 hours.  BNP: BNP (last 3 results) Recent Labs    03/17/23 0433  BNP 130.2*    ProBNP (last 3 results) No results for input(s): "PROBNP" in the last 8760 hours.   CBG: Recent Labs  Lab 03/16/23 1203 03/16/23 1648 03/16/23 2132 03/17/23 0741 03/17/23 1133  GLUCAP 149* 109* 141* 184* 138*    Coagulation Studies: No results for input(s): "LABPROT", "INR" in the last 72 hours.   Imaging   VAS [place] LOWER EXTREMITY VENOUS (DVT) Result Date: 03/17/2023  Lower Venous DVT Study Patient Name:  Mandy Patel  Date of Exam:   03/17/2023 Medical Rec #: [id number]        Accession #:    [id number] Date of Birth: Jan 06, 1996       Patient Gender: F Patient Age:   67 years Exam Location:  [facility] Procedure:      VAS [place] LOWER EXTREMITY VENOUS (DVT) Referring Phys: Jeoffrey Massed --------------------------------------------------------------------------------  Indications: Swelling.  Risk Factors: None identified. Limitations: Patient positioning, patient actively vomiting. Comparison Study: No prior studies. Performing Technologist: Chanda Busing RVT  Examination Guidelines: A complete evaluation includes B-mode  imaging, spectral Doppler, color Doppler, and power Doppler as needed of all accessible  portions of each vessel. Bilateral testing is considered an integral part of a complete examination. Limited examinations for reoccurring indications may be performed as noted. The reflux portion of the exam is performed with the patient in reverse Trendelenburg.  +---------+---------------+---------+-----------+----------+--------------+ RIGHT    CompressibilityPhasicitySpontaneityPropertiesThrombus Aging +---------+---------------+---------+-----------+----------+--------------+ CFV      Full           Yes      Yes                                 +---------+---------------+---------+-----------+----------+--------------+ SFJ      Full                                                        +---------+---------------+---------+-----------+----------+--------------+ FV Prox  Full                                                        +---------+---------------+---------+-----------+----------+--------------+ FV Mid   Full                                                        +---------+---------------+---------+-----------+----------+--------------+ FV DistalFull                                                        +---------+---------------+---------+-----------+----------+--------------+ PFV      Full                                                        +---------+---------------+---------+-----------+----------+--------------+ POP      Full           Yes      Yes                                 +---------+---------------+---------+-----------+----------+--------------+ PTV      Full                                                        +---------+---------------+---------+-----------+----------+--------------+ PERO     Full                                                        +---------+---------------+---------+-----------+----------+--------------+   +---------+---------------+---------+-----------+----------+--------------+  LEFT      CompressibilityPhasicitySpontaneityPropertiesThrombus Aging +---------+---------------+---------+-----------+----------+--------------+ CFV      Full           Yes      Yes                                 +---------+---------------+---------+-----------+----------+--------------+ SFJ      Full                                                        +---------+---------------+---------+-----------+----------+--------------+ FV Prox  Full                                                        +---------+---------------+---------+-----------+----------+--------------+ FV Mid   Full                                                        +---------+---------------+---------+-----------+----------+--------------+ FV DistalFull                                                        +---------+---------------+---------+-----------+----------+--------------+ PFV      Full                                                        +---------+---------------+---------+-----------+----------+--------------+ POP      Full           Yes      Yes                                 +---------+---------------+---------+-----------+----------+--------------+ PTV      Full                                                        +---------+---------------+---------+-----------+----------+--------------+ PERO     Full                                                        +---------+---------------+---------+-----------+----------+--------------+    Summary: RIGHT: - There is no evidence of deep vein thrombosis in the lower extremity.  - No cystic structure found in the popliteal fossa.  LEFT: - There is no evidence of deep vein thrombosis in the lower extremity.  - No cystic  structure found in the popliteal fossa.  *See table(s) above for measurements and observations.    Preliminary    ECHOCARDIOGRAM COMPLETE Result Date: 03/17/2023    ECHOCARDIOGRAM REPORT   Patient Name:    Mandy Patel Date of Exam: 03/17/2023 Medical Rec #:  [id number]       Height:       68.0 in Accession #:    [id number]      Weight:       193.0 lb Date of Birth:  November 11, 1995      BSA:          2.013 m Patient Age:    27 years        BP:           143/89 mmHg Patient Gender: F               HR:           108 bpm. Exam Location:  Inpatient Procedure: 2D Echo, Cardiac Doppler and Color Doppler (Both Spectral and Color            Flow Doppler were utilized during procedure). Indications:    R06.02 SOB  History:        Patient has no prior history of Echocardiogram examinations.                 Signs/Symptoms:Shortness of Breath; Risk Factors:Diabetes and                 Dyslipidemia. FLU positive.  Sonographer:    Sheralyn Boatman RDCS Referring Phys: [id number] [facility]  Sonographer Comments: Technically difficult study due to poor echo windows. IMPRESSIONS  1. Left ventricular ejection fraction, by estimation, is 65 to 70%. The left ventricle has normal function. The left ventricle has no regional wall motion abnormalities. Left ventricular diastolic parameters were normal.  2. Right ventricular systolic function is normal. The right ventricular size is mildly enlarged. There is severely elevated pulmonary artery systolic pressure. The estimated right ventricular systolic pressure is 65.7 mmHg.  3. The mitral valve is normal in structure. No evidence of mitral valve regurgitation.  4. The aortic valve is normal in structure. Aortic valve regurgitation is not visualized. FINDINGS  Left Ventricle: Left ventricular ejection fraction, by estimation, is 65 to 70%. The left ventricle has normal function. The left ventricle has no regional wall motion abnormalities. Strain imaging was not performed. The left ventricular internal cavity  size was normal in size. There is no left ventricular hypertrophy. Left ventricular diastolic parameters were normal. Right Ventricle: The right ventricular size is mildly enlarged. No  increase in right ventricular wall thickness. Right ventricular systolic function is normal. There is severely elevated pulmonary artery systolic pressure. The tricuspid regurgitant velocity is 3.56 m/s, and with an assumed right atrial pressure of 15 mmHg, the estimated right ventricular systolic pressure is 65.7 mmHg. Left Atrium: Left atrial size was normal in size. Right Atrium: Right atrial size was normal in size. Pericardium: There is no evidence of pericardial effusion. Mitral Valve: The mitral valve is normal in structure. No evidence of mitral valve regurgitation. Tricuspid Valve: The tricuspid valve is grossly normal. Tricuspid valve regurgitation is mild. Aortic Valve: The aortic valve is normal in structure. There is mild aortic valve annular calcification. Aortic valve regurgitation is not visualized. Pulmonic Valve: The pulmonic valve was not well visualized. Pulmonic valve regurgitation is trivial. Aorta: The aortic root and ascending aorta are structurally normal, with no evidence of  dilitation. IAS/Shunts: No atrial level shunt detected by color flow Doppler. Additional Comments: 3D imaging was not performed.  LEFT VENTRICLE PLAX 2D LVIDd:         4.50 cm     Diastology LVIDs:         2.50 cm     LV e' medial:    9.64 cm/s LV PW:         1.00 cm     LV E/e' medial:  8.9 LV IVS:        1.00 cm     LV e' lateral:   19.60 cm/s LVOT diam:     2.20 cm     LV E/e' lateral: 4.4 LV SV:         67 LV SV Index:   33 LVOT Area:     3.80 cm  LV Volumes (MOD) LV vol d, MOD A2C: 76.0 ml LV vol d, MOD A4C: 75.9 ml LV vol s, MOD A2C: 25.9 ml LV vol s, MOD A4C: 21.7 ml LV SV MOD A2C:     50.1 ml LV SV MOD A4C:     75.9 ml LV SV MOD BP:      51.7 ml RIGHT VENTRICLE             IVC RV S prime:     16.90 cm/s  IVC diam: 1.80 cm TAPSE (M-mode): 2.2 cm LEFT ATRIUM             Index        RIGHT ATRIUM           Index LA diam:        3.70 cm 1.84 cm/m   RA Area:     11.30 cm LA Vol (A2C):   19.6 ml 9.74 ml/m   RA  Volume:   26.00 ml  12.92 ml/m LA Vol (A4C):   25.1 ml 12.47 ml/m LA Biplane Vol: 23.0 ml 11.43 ml/m  AORTIC VALVE LVOT Vmax:   117.00 cm/s LVOT Vmean:  78.400 cm/s LVOT VTI:    0.176 m  AORTA Ao Root diam: 2.90 cm Ao Asc diam:  2.80 cm MITRAL VALVE               TRICUSPID VALVE MV Area (PHT): 5.54 cm    TR Peak grad:   50.7 mmHg MV Decel Time: 137 msec    TR Vmax:        356.00 cm/s MV E velocity: 85.50 cm/s MV A velocity: 71.00 cm/s  SHUNTS MV E/A ratio:  1.20        Systemic VTI:  0.18 m                            Systemic Diam: 2.20 cm Aditya Sabharwal Electronically signed by Dorthula Nettles Signature Date/Time: 03/17/2023/9:52:51 AM    Final    DG Chest Port 1V same Day Result Date: 03/16/2023 CLINICAL DATA:  Shortness of breath. Nausea and weakness. Influenza infection. EXAM: PORTABLE CHEST 1 VIEW COMPARISON:  03/14/2023. FINDINGS: The heart is mildly enlarged and mediastinal contours are within normal limits. Diffuse scattered airspace opacities are noted in the lungs bilaterally. No effusion or pneumothorax is seen. No acute osseous abnormality. IMPRESSION: Diffuse patchy airspace opacities in the lungs bilaterally, concerning for multifocal pneumonia. Electronically Signed   By: Thornell Sartorius M.D.   On: 03/16/2023 19:38     Medications:     Current Medications:  amLODipine  5 mg Oral Daily   heparin  5,000 Units Subcutaneous Q8H   insulin aspart  0-15 Units Subcutaneous TID WC   insulin aspart  0-5 Units Subcutaneous QHS   insulin glargine-yfgn  16 Units Subcutaneous Daily   metoCLOPramide (REGLAN) injection  10 mg Intravenous TID AC   metoprolol succinate  50 mg Oral Daily   oseltamivir  30 mg Oral BID   pantoprazole (PROTONIX) IV  40 mg Intravenous Q24H   sodium chloride flush  3 mL Intravenous Q12H    Infusions:  azithromycin 500 mg (03/17/23 1259)   cefTRIAXone (ROCEPHIN)  IV 2 g (03/17/23 1257)      Patient Profile   28 y/o female w/ poorly controlled Type 1DM (Hgb  A1c 10) and CKD III admitted w/ DKA and influenza A with secondary PNA. AHF team asked to see given concern for elevated RVSP suggestive of PH on echo.   Assessment/Plan   1. ? PH - Echo w/ severely elevated RVSP 66 mmHg, RV systolic fx however is normal. LVEF 65-70% - this is in setting of acute respiratory illness w/ influenza A and possible viral vs multifocal bacterial PNA being treated w/ abx  - d/w CCM. They will obtain CTD serologies and checking a V/Q scan to check for PE/CTEPH. Renal fx will not allow for chest CT w/ contrast  - she may need RHC but will wait until better from respiratory standpoint  2. Type 1DM w/ DKA - Hgb A1c 10 - ? blocker hydroxybutyric acid elevated on admit - improved on insulin gtt - per primary team   3. Flu A - supportive Care w/ tamiflu  4. PNA - ? Viral vs bacterial  - covering w/ abx   5. Acute Hypoxic Respiratory Failure - on 2L [place] - treat flu/PNA per above  6. AKI on CKD IIIa - diabetic nephropathy, b/l SCr ~1.5 - admit SCr 3.6, in setting of DKA - improving, SCr 2.98 today     Length of Stay: [address], PA-C  03/17/2023, 3:48 PM  Advanced Heart Failure Team Pager [phone] (M-F; 7a - 5p)  Please contact [facility] for night-coverage after hours (4p -7a ) and weekends on [email or web address]

## 2023-03-18 ENCOUNTER — Inpatient Hospital Stay (HOSPITAL_COMMUNITY): Payer: Managed Care, Other (non HMO)

## 2023-03-18 DIAGNOSIS — E1010 Type 1 diabetes mellitus with ketoacidosis without coma: Secondary | ICD-10-CM | POA: Diagnosis not present

## 2023-03-18 DIAGNOSIS — N179 Acute kidney failure, unspecified: Secondary | ICD-10-CM | POA: Diagnosis not present

## 2023-03-18 DIAGNOSIS — J09X1 Influenza due to identified novel influenza A virus with pneumonia: Secondary | ICD-10-CM

## 2023-03-18 DIAGNOSIS — D649 Anemia, unspecified: Secondary | ICD-10-CM | POA: Diagnosis not present

## 2023-03-18 DIAGNOSIS — R0600 Dyspnea, unspecified: Secondary | ICD-10-CM

## 2023-03-18 DIAGNOSIS — N189 Chronic kidney disease, unspecified: Secondary | ICD-10-CM | POA: Diagnosis not present

## 2023-03-18 LAB — BASIC METABOLIC PANEL
Anion gap: 13 (ref 5–15)
BUN: 23 mg/dL — ABNORMAL HIGH (ref 6–20)
CO2: 19 mmol/L — ABNORMAL LOW (ref 22–32)
Calcium: 7.5 mg/dL — ABNORMAL LOW (ref 8.9–10.3)
Chloride: 100 mmol/L (ref 98–111)
Creatinine, Ser: 3.06 mg/dL — ABNORMAL HIGH (ref 0.44–1.00)
GFR, Estimated: 21 mL/min — ABNORMAL LOW (ref >60)
Glucose, Bld: 83 mg/dL (ref 70–99)
Potassium: 3.1 mmol/L — ABNORMAL LOW (ref 3.5–5.1)
Sodium: 132 mmol/L — ABNORMAL LOW (ref 135–145)

## 2023-03-18 LAB — CBC
HCT: 25.4 % — ABNORMAL LOW (ref 36.0–46.0)
Hemoglobin: 8.5 g/dL — ABNORMAL LOW (ref 12.0–15.0)
MCH: 31.1 pg (ref 26.0–34.0)
MCHC: 33.5 g/dL (ref 30.0–36.0)
MCV: 93 fL (ref 80.0–100.0)
Platelets: 192 10*3/uL (ref 150–400)
RBC: 2.73 MIL/uL — ABNORMAL LOW (ref 3.87–5.11)
RDW: 12.5 % (ref 11.5–15.5)
WBC: 19 10*3/uL — ABNORMAL HIGH (ref 4.0–10.5)
nRBC: 0 % (ref 0.0–0.2)

## 2023-03-18 LAB — SJOGRENS SYNDROME-A EXTRACTABLE NUCLEAR ANTIBODY: SSA (Ro) (ENA) Antibody, IgG: <0.2 AI (ref 0.0–0.9)

## 2023-03-18 LAB — GLUCOSE, CAPILLARY
Glucose-Capillary: 134 mg/dL — ABNORMAL HIGH (ref 70–99)
Glucose-Capillary: 102 mg/dL — ABNORMAL HIGH (ref 70–99)
Glucose-Capillary: 83 mg/dL (ref 70–99)
Glucose-Capillary: 178 mg/dL — ABNORMAL HIGH (ref 70–99)

## 2023-03-18 LAB — ANCA PROFILE
Anti-MPO Antibodies: <0.2 U (ref 0.0–0.9)
Anti-PR3 Antibodies: <0.2 U (ref 0.0–0.9)
Atypical P-ANCA titer: <1:20 {titer}
C-ANCA: <1:20 {titer}
P-ANCA: <1:20 {titer}

## 2023-03-18 LAB — PROCALCITONIN: Procalcitonin: 2.08 ng/mL

## 2023-03-18 LAB — ANTI-SCLERODERMA ANTIBODY: Scleroderma (Scl-70) (ENA) Antibody, IgG: <0.2 AI (ref 0.0–0.9)

## 2023-03-18 LAB — ALDOLASE: Aldolase: 15.1 U/L — ABNORMAL HIGH (ref 3.3–10.3)

## 2023-03-18 LAB — SJOGRENS SYNDROME-B EXTRACTABLE NUCLEAR ANTIBODY: SSB (La) (ENA) Antibody, IgG: <0.2 AI (ref 0.0–0.9)

## 2023-03-18 MED ORDER — POTASSIUM CHLORIDE CRYS ER 20 MEQ PO TBCR
40.0000 meq | EXTENDED_RELEASE_TABLET | Freq: Once | ORAL | Status: AC
  Administered 2023-03-18: 40 meq via ORAL
  Filled 2023-03-18: qty 2

## 2023-03-18 MED ORDER — TECHNETIUM TO 99M ALBUMIN AGGREGATED
4.0000 | Freq: Once | INTRAVENOUS | Status: AC | PRN
  Administered 2023-03-18: 4 via INTRAVENOUS

## 2023-03-18 NOTE — Progress Notes (Signed)
   NAME:  Mandy Patel, MRN:  [id number], DOB:  1995-12-01, LOS: 4 ADMISSION DATE:  03/14/2023, CONSULTATION DATE:  03/17/23 REFERRING MD:  ghimire (TRH), CHIEF COMPLAINT:  pulm htn    History of Present Illness:  27yo female with hx CKD3, DM1 admitted 2/10 with DKA in setting fluA infection. She developed worsening dyspnea and concern for PNA on CXR. Echo was obtained which revealed severely elevated PA pressure and pulmonary was consulted.   Pertinent  Medical History   has a past medical history of DKA (diabetic ketoacidosis) (HCC), Pyelonephritis (03/2017), and Type 1 diabetes (HCC).  Significant Hospital Events: Including procedures, antibiotic start and stop dates in addition to other pertinent events   2/13 2D echo > EF 65-70%, severely elevated PASP with estimated RVSP 65.66mmHg , mild TR.  2/14 Seen this am with no acute complaints, reports some continued cough   Interim History / Subjective:  States she feels ok this am   Objective   Blood pressure (!) 142/71, pulse (!) 101, temperature 98.4 F (36.9 C), temperature source Oral, resp. rate 18, height 5\' 8"  (1.727 m), weight 87.5 kg, last menstrual period 03/14/2023, SpO2 (!) 84%.        Intake/Output Summary (Last 24 hours) at 03/18/2023 1408 Last data filed at 03/18/2023 7829 Gross per 24 hour  Intake 3 ml  Output --  Net 3 ml   Filed Weights   03/14/23 5621  Weight: 87.5 kg    Examination: General: Well appearing adult female sitting up on edge of bed in NAD  HEENT: [place]/AT, MM pink/moist, PERRL,  Neuro: Alert and oriented x3, non-focal  CV: s1s2 regular rate and rhythm, no murmur, rubs, or gallops,  PULM:  Clear to auscultation, no increased work of breathing, no added breath sounds  GI: soft, bowel sounds active in all 4 quadrants, non-tender, non-distended, tolerating oral diet Extremities: warm/dry, no edema  Skin: no rashes or lesions  Resolved Hospital Problem list     Assessment & Plan:  FluA with  multifocal PNA  Suspect severe pulmonary HTN by echo  -Unclear etiology. Recent autoimmune w/u by nephrology negative. HIV neg. No sig valvular abnormalities on echo. Not obese, nothing to suggest OSA.   P: Continue supplemental oxygen wean as able  HF following, continue supportive care with plan for RHC once optimized  Follow LE dopplers  Follow autoimmune workup   Best Practice (right click and "Reselect all SmartList Selections" daily)   Per primary    Tasheem Elms D. Harris, NP-C [facility] Personal contact information can be found on Amion  If no contact or response made please call [phone] 03/18/2023, 2:21 PM

## 2023-03-18 NOTE — Progress Notes (Addendum)
PROGRESS NOTE        PATIENT DETAILS Name: Mandy Patel Age: 28 y.o. Sex: female Date of Birth: February 11, 1995 Admit Date: 03/14/2023 Admitting Physician Clydie Braun, MD WUJ:[id number], Genene Churn, NP  Brief Summary: Patient is a 28 y.o.  female with history of DM-1-presented with nausea/weakness-found to have DKA with AKI in the setting of influenza A infection.  Significant events: 2/10>> admit to [facility]-DKA/AKI/influenza-CXR without pneumonia. 2/12>> worsening SOB-Lasix x 1 dose-CXR with multifocal PNA. 2/13>>Rocephin/zithro started  Significant studies: 2/10>> CXR: No PNA 2/12>> CXR: Multifocal pneumonia 2/13>> echo: EF 65-70%, RVSP 65.7.  Significant microbiology data: 2/10>> influenza A PCR: Positive 2/10>> influenza B/RSV/COVID PCR: Negative  Procedures: None  Consults: PCCM Advanced heart failure team  Subjective: Breathing is better-titrated to room air.  Objective: Vitals: Blood pressure (!) 142/71, pulse (!) 101, temperature 98.4 F (36.9 C), temperature source Oral, resp. rate 18, height 5\' 8"  (1.727 m), weight 87.5 kg, last menstrual period 03/14/2023, SpO2 (!) 84%.   Exam: Gen Exam:Alert awake-not in any distress HEENT:atraumatic, normocephalic Chest: B/L clear to auscultation anteriorly CVS:S1S2 regular Abdomen:soft non tender, non distended Extremities:no edema Neurology: Non focal Skin: no rash  Pertinent Labs/Radiology:    Latest Ref Rng & Units 03/18/2023    5:09 AM 03/17/2023    4:33 AM 03/16/2023    4:33 AM  CBC  WBC 4.0 - 10.5 K/uL 19.0  19.4  16.0   Hemoglobin 12.0 - 15.0 g/dL 8.5  8.9  47.8   Hematocrit 36.0 - 46.0 % 25.4  27.2  32.6   Platelets 150 - 400 K/uL 192  209  231     Lab Results  Component Value Date   NA 132 (L) 03/18/2023   K 3.1 (L) 03/18/2023   CL 100 03/18/2023   CO2 19 (L) 03/18/2023     Assessment/Plan: DKA Resolved Treated with IVF/IV insulin Transitioned to SQ insulin  AKI on CKD  stage IIIb AKI likely hemodynamically mediated-the setting of DKA/poor appetite/ARB Creatinine had slowly improved-but is an uptrending  Volume status continues to be stable-oral intake has stabilized  Continue to watch closely-avoid nephrotoxic agents Follow weights/electrolytes/intake/output closely  Recent autoimmune workup done by outpatient nephrologist in December 2024 was negative repeat autoimmune workup currently pending  SIRS secondary to Influenza A infection Multifocal pneumonia Developed SOB on 2/12-repeat x-ray on 2/13 showed multifocal pneumonia-started on Rocephin/Zithromax Remains on Tamiflu Minimal to no hypoxia-on room air this morning.  Nausea Suspect this may be gastroparesis Has improved significantly after scheduled Reglan and small portion meals.  Hypokalemia Replete/recheck.  DM-1 (A1c 10.1 on 2/10) CBG stable Lantus 16 units+ SSI  Recent Labs    03/17/23 1941 03/17/23 2153 03/18/23 0755  GLUCAP 68* 121* 134*    HTN BP stable Continue metoprolol/amlodipine ARB on hold  Severe pulmonary hypertension Seen on echocardiogram Unclear etiology-repeat autoimmune workup pending-lower extremity Dopplers negative-echo without major LV/valvular issues-VQ scan.  PCCM/advanced heart failure team now following.  Will await further recommendations.   Normocytic anemia Likely due to IVF dilution/acute illness No evidence of blood loss Follow CBC.  BMI: Estimated body mass index is 29.35 kg/m as calculated from the following:   Height as of this encounter: 5\' 8"  (1.727 m).   Weight as of this encounter: 87.5 kg.   Code status:   Code Status: Full Code   DVT Prophylaxis:  heparin injection 5,000 Units Start: 03/14/23 1400   Family Communication: Mother-[phone] updated over the phone 2/13   Disposition Plan: Status is: Inpatient Remains inpatient appropriate because: Severity of illness   Planned Discharge Destination:Home   Diet: Diet  Order             Diet Carb Modified Fluid consistency: Thin; Room service appropriate? Yes  Diet effective now                     Antimicrobial agents: Anti-infectives (From admission, onward)    Start     Dose/Rate Route Frequency Ordered Stop   03/17/23 0800  cefTRIAXone (ROCEPHIN) 2 g in sodium chloride 0.9 % 100 mL IVPB        2 g 200 mL/hr over 30 Minutes Intravenous Every 24 hours 03/17/23 0653     03/17/23 0800  azithromycin (ZITHROMAX) 500 mg in sodium chloride 0.9 % 250 mL IVPB        500 mg 250 mL/hr over 60 Minutes Intravenous Every 24 hours 03/17/23 0653 03/20/23 0759   03/15/23 0800  oseltamivir (TAMIFLU) capsule 30 mg       Note to Pharmacy: Tamiflu 30 mg BID for CrCl <  60 mL/min   30 mg Oral 2 times daily 03/15/23 0653 03/20/23 0959   03/14/23 1330  metroNIDAZOLE (FLAGYL) tablet 2,000 mg        2,000 mg Oral  Once 03/14/23 1326 03/14/23 1352        MEDICATIONS: Scheduled Meds:  amLODipine  5 mg Oral Daily   heparin  5,000 Units Subcutaneous Q8H   insulin aspart  0-15 Units Subcutaneous TID WC   insulin aspart  0-5 Units Subcutaneous QHS   insulin glargine-yfgn  16 Units Subcutaneous Daily   metoCLOPramide (REGLAN) injection  10 mg Intravenous TID AC   metoprolol succinate  50 mg Oral Daily   oseltamivir  30 mg Oral BID   pantoprazole (PROTONIX) IV  40 mg Intravenous Q24H   sodium chloride flush  3 mL Intravenous Q12H   Continuous Infusions:  azithromycin Stopped (03/18/23 1027)   cefTRIAXone (ROCEPHIN)  IV Stopped (03/18/23 0954)   PRN Meds:.acetaminophen **OR** acetaminophen, dextrose, hydrALAZINE, levalbuterol, ondansetron (ZOFRAN) IV, traMADol   I have personally reviewed following labs and imaging studies  LABORATORY DATA: CBC: Recent Labs  Lab 03/14/23 0759 03/14/23 0824 03/15/23 0439 03/16/23 0433 03/17/23 0433 03/18/23 0509  WBC 14.7*  --  15.8* 16.0* 19.4* 19.0*  NEUTROABS 12.3*  --   --   --   --   --   HGB 11.1* 11.2* 9.8*  10.6* 8.9* 8.5*  HCT 33.3* 33.0* 29.3* 32.6* 27.2* 25.4*  MCV 94.6  --  93.0 95.6 94.1 93.0  PLT 326  --  298 231 209 192    Basic Metabolic Panel: Recent Labs  Lab 03/15/23 0439 03/15/23 0908 03/16/23 0433 03/17/23 0433 03/18/23 0509  NA 136 135 135 135 132*  K 3.5 3.7 3.8 3.5 3.1*  CL 104 107 102 102 100  CO2 18* 18* 18* 20* 19*  GLUCOSE 175* 184* 145* 163* 83  BUN 29* 28* 23* 22* 23*  CREATININE 3.03* 3.05* 2.78* 2.89* 3.06*  CALCIUM 8.5* 8.1* 8.3* 7.8* 7.5*    GFR: Estimated Creatinine Clearance: 32 mL/min (A) (by C-G formula based on SCr of 3.06 mg/dL (H)).  Liver Function Tests: Recent Labs  Lab 03/14/23 0759  AST 11*  ALT 13  ALKPHOS 94  BILITOT 1.2  PROT  6.9  ALBUMIN 2.8*   Recent Labs  Lab 03/14/23 0759  LIPASE 21   No results for input(s): "AMMONIA" in the last 168 hours.  Coagulation Profile: No results for input(s): "INR", "PROTIME" in the last 168 hours.  Cardiac Enzymes: Recent Labs  Lab 03/17/23 1703  CKTOTAL 110    BNP (last 3 results) No results for input(s): "PROBNP" in the last 8760 hours.  Lipid Profile: No results for input(s): "CHOL", "HDL", "LDLCALC", "TRIG", "CHOLHDL", "LDLDIRECT" in the last 72 hours.  Thyroid Function Tests: No results for input(s): "TSH", "T4TOTAL", "FREET4", "T3FREE", "THYROIDAB" in the last 72 hours.  Anemia Panel: Recent Labs    03/17/23 1703  FERRITIN 156    Urine analysis:    Component Value Date/Time   COLORURINE YELLOW 03/14/2023 1130   APPEARANCEUR CLOUDY (A) 03/14/2023 1130   LABSPEC 1.020 03/14/2023 1130   PHURINE 6.0 03/14/2023 1130   GLUCOSEU >=500 (A) 03/14/2023 1130   GLUCOSEU >=1000 12/01/2010 1051   HGBUR LARGE (A) 03/14/2023 1130   BILIRUBINUR NEGATIVE 03/14/2023 1130   KETONESUR 40 (A) 03/14/2023 1130   PROTEINUR >300 (A) 03/14/2023 1130   UROBILINOGEN 0.2 12/01/2010 1051   NITRITE NEGATIVE 03/14/2023 1130   LEUKOCYTESUR NEGATIVE 03/14/2023 1130    Sepsis  Labs: Lactic Acid, Venous    Component Value Date/Time   LATICACIDVEN 0.9 06/23/2021 0335    MICROBIOLOGY: Recent Results (from the past 240 hours)  Resp panel by RT-PCR (RSV, Flu A&B, Covid) Anterior Nasal Swab     Status: Abnormal   Collection Time: 03/14/23  8:00 AM   Specimen: Anterior Nasal Swab  Result Value Ref Range Status   SARS Coronavirus 2 by RT PCR NEGATIVE NEGATIVE Final   Influenza A by PCR POSITIVE (A) NEGATIVE Final   Influenza B by PCR NEGATIVE NEGATIVE Final    Comment: (NOTE) The Xpert Xpress SARS-CoV-2/FLU/RSV plus assay is intended as an aid in the diagnosis of influenza from Nasopharyngeal swab specimens and should not be used as a sole basis for treatment. Nasal washings and aspirates are unacceptable for Xpert Xpress SARS-CoV-2/FLU/RSV testing.  Fact Sheet for Patients: BloggerCourse.com  Fact Sheet for Healthcare Providers: SeriousBroker.it  This test is not yet approved or cleared by the [place] FDA and has been authorized for detection and/or diagnosis of SARS-CoV-2 by FDA under an Emergency Use Authorization (EUA). This EUA will remain in effect (meaning this test can be used) for the duration of the COVID-19 declaration under Section 564(b)(1) of the Act, 21 U.S.C. section 360bbb-3(b)(1), unless the authorization is terminated or revoked.     Resp Syncytial Virus by PCR NEGATIVE NEGATIVE Final    Comment: (NOTE) Fact Sheet for Patients: BloggerCourse.com  Fact Sheet for Healthcare Providers: SeriousBroker.it  This test is not yet approved or cleared by the [place] FDA and has been authorized for detection and/or diagnosis of SARS-CoV-2 by FDA under an Emergency Use Authorization (EUA). This EUA will remain in effect (meaning this test can be used) for the duration of the COVID-19 declaration under Section 564(b)(1) of the Act, 21  U.S.C. section 360bbb-3(b)(1), unless the authorization is terminated or revoked.  Performed at [facility], [address] [place], [place] [address]   Respiratory (~20 pathogens) panel by PCR     Status: Abnormal   Collection Time: 03/15/23 12:29 PM   Specimen: Nasopharyngeal Swab; Respiratory  Result Value Ref Range Status   Adenovirus NOT DETECTED NOT DETECTED Final   Coronavirus 229E NOT DETECTED  NOT DETECTED Final    Comment: (NOTE) The Coronavirus on the Respiratory Panel, DOES NOT test for the novel  Coronavirus (2019 nCoV)    Coronavirus HKU1 NOT DETECTED NOT DETECTED Final   Coronavirus NL63 NOT DETECTED NOT DETECTED Final   Coronavirus OC43 NOT DETECTED NOT DETECTED Final   Metapneumovirus NOT DETECTED NOT DETECTED Final   Rhinovirus / Enterovirus NOT DETECTED NOT DETECTED Final   Influenza A H1 2009 DETECTED (A) NOT DETECTED Final   Influenza B NOT DETECTED NOT DETECTED Final   Parainfluenza Virus 1 NOT DETECTED NOT DETECTED Final   Parainfluenza Virus 2 NOT DETECTED NOT DETECTED Final   Parainfluenza Virus 3 NOT DETECTED NOT DETECTED Final   Parainfluenza Virus 4 NOT DETECTED NOT DETECTED Final   Respiratory Syncytial Virus NOT DETECTED NOT DETECTED Final   Bordetella pertussis NOT DETECTED NOT DETECTED Final   Bordetella Parapertussis NOT DETECTED NOT DETECTED Final   Chlamydophila pneumoniae NOT DETECTED NOT DETECTED Final   Mycoplasma pneumoniae NOT DETECTED NOT DETECTED Final    Comment: Performed at [facility], [address] [place], [place] [address]  Culture, blood (Routine X 2) w Reflex to ID Panel     Status: None (Preliminary result)   Collection Time: 03/17/23 11:14 AM   Specimen: BLOOD LEFT ARM  Result Value Ref Range Status   Specimen Description BLOOD LEFT ARM  Final   Special Requests   Final    BOTTLES DRAWN AEROBIC AND ANAEROBIC Blood Culture adequate volume   Culture   Final    NO GROWTH < 24 HOURS Performed at [facility], [address], [place], [place] [address]    Report Status PENDING  Incomplete  Culture, blood (Routine X 2) w Reflex to ID Panel     Status: None (Preliminary result)   Collection Time: 03/17/23 11:14 AM   Specimen: BLOOD LEFT ARM  Result Value Ref Range Status   Specimen Description BLOOD LEFT ARM  Final   Special Requests   Final    BOTTLES DRAWN AEROBIC AND ANAEROBIC Blood Culture adequate volume   Culture   Final    NO GROWTH < 24 HOURS Performed at [facility], [address], [place], [place] [address]    Report Status PENDING  Incomplete    RADIOLOGY STUDIES/RESULTS: ECHOCARDIOGRAM COMPLETE Result Date: 03/17/2023    ECHOCARDIOGRAM REPORT   Patient Name:   Chelbie A Tripoli Date of Exam: 03/17/2023 Medical Rec #:  [id number]       Height:       68.0 in Accession #:    [id number]      Weight:       193.0 lb Date of Birth:  1996/01/19      BSA:          2.013 m Patient Age:    27 years        BP:           143/89 mmHg Patient Gender: F               HR:           108 bpm. Exam Location:  Inpatient Procedure: 2D Echo, Cardiac Doppler and Color Doppler (Both Spectral and Color            Flow Doppler were utilized during procedure).  MODIFIED REPORT:      This report was modified by Dorthula Nettles on 03/17/2023 due to RVSP                                   correction.  Indications:     R06.02 SOB  History:         Patient has no prior history of Echocardiogram examinations.                  Signs/Symptoms:Shortness of Breath; Risk Factors:Diabetes and                  Dyslipidemia. FLU positive.  Sonographer:     Sheralyn Boatman RDCS Referring Phys:  [id number] Werner Lean [facility] Diagnosing Phys: Dorthula Nettles  Sonographer Comments: Technically difficult study due to poor echo windows. IMPRESSIONS  1. Left ventricular ejection fraction, by estimation, is 65 to 70%. The left ventricle has normal function. The left ventricle has no regional wall motion  abnormalities. Left ventricular diastolic parameters were normal.  2. Right ventricular systolic function is normal. The right ventricular size is mildly enlarged. There is moderately elevated pulmonary artery systolic pressure. The estimated right ventricular systolic pressure is 53.7 mmHg.  3. The mitral valve is normal in structure. No evidence of mitral valve regurgitation.  4. The aortic valve is normal in structure. Aortic valve regurgitation is not visualized. FINDINGS  Left Ventricle: Left ventricular ejection fraction, by estimation, is 65 to 70%. The left ventricle has normal function. The left ventricle has no regional wall motion abnormalities. Strain imaging was not performed. The left ventricular internal cavity  size was normal in size. There is no left ventricular hypertrophy. Left ventricular diastolic parameters were normal. Right Ventricle: The right ventricular size is mildly enlarged. No increase in right ventricular wall thickness. Right ventricular systolic function is normal. There is moderately elevated pulmonary artery systolic pressure. The tricuspid regurgitant velocity is 3.56 m/s, and with an assumed right atrial pressure of 3 mmHg, the estimated right ventricular systolic pressure is 53.7 mmHg. Left Atrium: Left atrial size was normal in size. Right Atrium: Right atrial size was normal in size. Pericardium: There is no evidence of pericardial effusion. Mitral Valve: The mitral valve is normal in structure. No evidence of mitral valve regurgitation. Tricuspid Valve: The tricuspid valve is grossly normal. Tricuspid valve regurgitation is mild. Aortic Valve: The aortic valve is normal in structure. There is mild aortic valve annular calcification. Aortic valve regurgitation is not visualized. Pulmonic Valve: The pulmonic valve was not well visualized. Pulmonic valve regurgitation is trivial. Aorta: The aortic root and ascending aorta are structurally normal, with no evidence of  dilitation. IAS/Shunts: No atrial level shunt detected by color flow Doppler. Additional Comments: 3D imaging was not performed.  LEFT VENTRICLE PLAX 2D LVIDd:         4.50 cm     Diastology LVIDs:         2.50 cm     LV e' medial:    9.64 cm/s LV PW:         1.00 cm     LV E/e' medial:  8.9 LV IVS:        1.00 cm     LV e' lateral:   19.60 cm/s LVOT diam:     2.20 cm     LV E/e' lateral: 4.4 LV SV:  67 LV SV Index:   33 LVOT Area:     3.80 cm  LV Volumes (MOD) LV vol d, MOD A2C: 76.0 ml LV vol d, MOD A4C: 75.9 ml LV vol s, MOD A2C: 25.9 ml LV vol s, MOD A4C: 21.7 ml LV SV MOD A2C:     50.1 ml LV SV MOD A4C:     75.9 ml LV SV MOD BP:      51.7 ml RIGHT VENTRICLE             IVC RV S prime:     16.90 cm/s  IVC diam: 1.80 cm TAPSE (M-mode): 2.2 cm LEFT ATRIUM             Index        RIGHT ATRIUM           Index LA diam:        3.70 cm 1.84 cm/m   RA Area:     11.30 cm LA Vol (A2C):   19.6 ml 9.74 ml/m   RA Volume:   26.00 ml  12.92 ml/m LA Vol (A4C):   25.1 ml 12.47 ml/m LA Biplane Vol: 23.0 ml 11.43 ml/m  AORTIC VALVE LVOT Vmax:   117.00 cm/s LVOT Vmean:  78.400 cm/s LVOT VTI:    0.176 m  AORTA Ao Root diam: 2.90 cm Ao Asc diam:  2.80 cm MITRAL VALVE               TRICUSPID VALVE MV Area (PHT): 5.54 cm    TR Peak grad:   50.7 mmHg MV Decel Time: 137 msec    TR Vmax:        356.00 cm/s MV E velocity: 85.50 cm/s MV A velocity: 71.00 cm/s  SHUNTS MV E/A ratio:  1.20        Systemic VTI:  0.18 m                            Systemic Diam: 2.20 cm Aditya Sabharwal Electronically signed by Dorthula Nettles Signature Date/Time: 03/17/2023/9:52:51 AM    Final (Updated)    VAS [place] LOWER EXTREMITY VENOUS (DVT) Result Date: 03/17/2023  Lower Venous DVT Study Patient Name:  Sabeen Cordelia Patel  Date of Exam:   03/17/2023 Medical Rec #: [id number]        Accession #:    [id number] Date of Birth: 11/03/1995       Patient Gender: F Patient Age:   84 years Exam [facility]  [facility] Procedure:      VAS [place] LOWER  EXTREMITY VENOUS (DVT) Referring Phys: Jeoffrey Massed --------------------------------------------------------------------------------  Indications: Swelling.  Risk Factors: None identified. Limitations: Patient positioning, patient actively vomiting. Comparison Study: No prior studies. Performing Technologist: Chanda Busing RVT  Examination Guidelines: A complete evaluation includes B-mode imaging, spectral Doppler, color Doppler, and power Doppler as needed of all accessible portions of each vessel. Bilateral testing is considered an integral part of a complete examination. Limited examinations for reoccurring indications may be performed as noted. The reflux portion of the exam is performed with the patient in reverse Trendelenburg.  +---------+---------------+---------+-----------+----------+--------------+ RIGHT    CompressibilityPhasicitySpontaneityPropertiesThrombus Aging +---------+---------------+---------+-----------+----------+--------------+ CFV      Full           Yes      Yes                                 +---------+---------------+---------+-----------+----------+--------------+  SFJ      Full                                                        +---------+---------------+---------+-----------+----------+--------------+ FV Prox  Full                                                        +---------+---------------+---------+-----------+----------+--------------+ FV Mid   Full                                                        +---------+---------------+---------+-----------+----------+--------------+ FV DistalFull                                                        +---------+---------------+---------+-----------+----------+--------------+ PFV      Full                                                        +---------+---------------+---------+-----------+----------+--------------+ POP      Full           Yes      Yes                                  +---------+---------------+---------+-----------+----------+--------------+ PTV      Full                                                        +---------+---------------+---------+-----------+----------+--------------+ PERO     Full                                                        +---------+---------------+---------+-----------+----------+--------------+   +---------+---------------+---------+-----------+----------+--------------+ LEFT     CompressibilityPhasicitySpontaneityPropertiesThrombus Aging +---------+---------------+---------+-----------+----------+--------------+ CFV      Full           Yes      Yes                                 +---------+---------------+---------+-----------+----------+--------------+ SFJ      Full                                                        +---------+---------------+---------+-----------+----------+--------------+  FV Prox  Full                                                        +---------+---------------+---------+-----------+----------+--------------+ FV Mid   Full                                                        +---------+---------------+---------+-----------+----------+--------------+ FV DistalFull                                                        +---------+---------------+---------+-----------+----------+--------------+ PFV      Full                                                        +---------+---------------+---------+-----------+----------+--------------+ POP      Full           Yes      Yes                                 +---------+---------------+---------+-----------+----------+--------------+ PTV      Full                                                        +---------+---------------+---------+-----------+----------+--------------+ PERO     Full                                                         +---------+---------------+---------+-----------+----------+--------------+     Summary: RIGHT: - There is no evidence of deep vein thrombosis in the lower extremity.  - No cystic structure found in the popliteal fossa.  LEFT: - There is no evidence of deep vein thrombosis in the lower extremity.  - No cystic structure found in the popliteal fossa.  *See table(s) above for measurements and observations. Electronically signed by Heath Lark on 03/17/2023 at 4:06:35 PM.    Final    DG Chest Port 1V same Day Result Date: 03/16/2023 CLINICAL DATA:  Shortness of breath. Nausea and weakness. Influenza infection. EXAM: PORTABLE CHEST 1 VIEW COMPARISON:  03/14/2023. FINDINGS: The heart is mildly enlarged and mediastinal contours are within normal limits. Diffuse scattered airspace opacities are noted in the lungs bilaterally. No effusion or pneumothorax is seen. No acute osseous abnormality. IMPRESSION: Diffuse patchy airspace opacities in the lungs bilaterally, concerning for multifocal pneumonia. Electronically Signed   By: Thornell Sartorius M.D.   On: 03/16/2023 19:38     LOS: 4 days  Jeoffrey Massed, MD  [facility]    To contact the attending provider between 7A-7P or the covering provider during after hours 7P-7A, please log into the web site [email or web address] and access using universal [place] password for that web site. If you do not have the password, please call the hospital operator.  03/18/2023, 11:21 AM

## 2023-03-19 ENCOUNTER — Inpatient Hospital Stay (HOSPITAL_COMMUNITY): Payer: Managed Care, Other (non HMO)

## 2023-03-19 DIAGNOSIS — J09X1 Influenza due to identified novel influenza A virus with pneumonia: Secondary | ICD-10-CM | POA: Diagnosis not present

## 2023-03-19 DIAGNOSIS — I2721 Secondary pulmonary arterial hypertension: Secondary | ICD-10-CM

## 2023-03-19 DIAGNOSIS — N179 Acute kidney failure, unspecified: Secondary | ICD-10-CM | POA: Diagnosis not present

## 2023-03-19 DIAGNOSIS — R0600 Dyspnea, unspecified: Secondary | ICD-10-CM | POA: Diagnosis not present

## 2023-03-19 DIAGNOSIS — J101 Influenza due to other identified influenza virus with other respiratory manifestations: Secondary | ICD-10-CM | POA: Diagnosis not present

## 2023-03-19 DIAGNOSIS — E1010 Type 1 diabetes mellitus with ketoacidosis without coma: Secondary | ICD-10-CM | POA: Diagnosis not present

## 2023-03-19 LAB — BASIC METABOLIC PANEL
Anion gap: 11 (ref 5–15)
BUN: 25 mg/dL — ABNORMAL HIGH (ref 6–20)
CO2: 16 mmol/L — ABNORMAL LOW (ref 22–32)
Calcium: 7.6 mg/dL — ABNORMAL LOW (ref 8.9–10.3)
Chloride: 104 mmol/L (ref 98–111)
Creatinine, Ser: 3.46 mg/dL — ABNORMAL HIGH (ref 0.44–1.00)
GFR, Estimated: 18 mL/min — ABNORMAL LOW (ref >60)
Glucose, Bld: 151 mg/dL — ABNORMAL HIGH (ref 70–99)
Potassium: 3.7 mmol/L (ref 3.5–5.1)
Sodium: 131 mmol/L — ABNORMAL LOW (ref 135–145)

## 2023-03-19 LAB — GLUCOSE, CAPILLARY
Glucose-Capillary: 134 mg/dL — ABNORMAL HIGH (ref 70–99)
Glucose-Capillary: 169 mg/dL — ABNORMAL HIGH (ref 70–99)
Glucose-Capillary: 180 mg/dL — ABNORMAL HIGH (ref 70–99)
Glucose-Capillary: 114 mg/dL — ABNORMAL HIGH (ref 70–99)

## 2023-03-19 LAB — C3 COMPLEMENT: C3 Complement: 150 mg/dL (ref 82–167)

## 2023-03-19 LAB — MAGNESIUM: Magnesium: 1.8 mg/dL (ref 1.7–2.4)

## 2023-03-19 LAB — CBC
HCT: 25 % — ABNORMAL LOW (ref 36.0–46.0)
Hemoglobin: 8.3 g/dL — ABNORMAL LOW (ref 12.0–15.0)
MCH: 31 pg (ref 26.0–34.0)
MCHC: 33.2 g/dL (ref 30.0–36.0)
MCV: 93.3 fL (ref 80.0–100.0)
Platelets: 210 10*3/uL (ref 150–400)
RBC: 2.68 MIL/uL — ABNORMAL LOW (ref 3.87–5.11)
RDW: 12.6 % (ref 11.5–15.5)
WBC: 16.6 10*3/uL — ABNORMAL HIGH (ref 4.0–10.5)
nRBC: 0 % (ref 0.0–0.2)

## 2023-03-19 LAB — RHEUMATOID FACTOR: Rheumatoid fact SerPl-aCnc: 30.4 [IU]/mL — ABNORMAL HIGH (ref <14.0)

## 2023-03-19 LAB — C4 COMPLEMENT: Complement C4, Body Fluid: 38 mg/dL (ref 12–38)

## 2023-03-19 MED ORDER — OSELTAMIVIR PHOSPHATE 30 MG PO CAPS: 30.0000 mg | ORAL_CAPSULE | Freq: Every day | ORAL | Status: DC

## 2023-03-19 MED ORDER — SODIUM CHLORIDE 0.9 % IV SOLN: INTRAVENOUS | Status: DC

## 2023-03-19 MED ORDER — PANTOPRAZOLE SODIUM 40 MG PO TBEC
40.0000 mg | DELAYED_RELEASE_TABLET | Freq: Every day | ORAL | Status: DC
  Administered 2023-03-20 – 2023-03-22 (×3): 40 mg via ORAL
  Filled 2023-03-19 (×3): qty 1

## 2023-03-19 MED ORDER — SODIUM BICARBONATE 650 MG PO TABS
650.0000 mg | ORAL_TABLET | Freq: Two times a day (BID) | ORAL | Status: DC
  Administered 2023-03-19 – 2023-03-22 (×7): 650 mg via ORAL
  Filled 2023-03-19 (×7): qty 1

## 2023-03-19 NOTE — Plan of Care (Signed)
?  Problem: Health Behavior/Discharge Planning: ?Goal: Ability to manage health-related needs will improve ?Outcome: Progressing ?  ?Problem: Clinical Measurements: ?Goal: Will remain free from infection ?Outcome: Progressing ?Goal: Respiratory complications will improve ?Outcome: Progressing ?  ?

## 2023-03-19 NOTE — Progress Notes (Signed)
   NAME:  Mandy Patel, MRN:  [id number], DOB:  [birth year], LOS: 5 ADMISSION DATE:  03/14/2023, CONSULTATION DATE:  03/17/23 REFERRING MD:  ghimire ([facility]), CHIEF COMPLAINT:  pulm htn    History of Present Illness:  27yo female with hx CKD3, DM1 admitted 2/10 with DKA in setting fluA infection. She developed worsening dyspnea and concern for PNA on CXR. Echo was obtained which revealed severely elevated PA pressure and pulmonary was consulted.   Pertinent  Medical History   has a past medical history of DKA (diabetic ketoacidosis) (HCC), Pyelonephritis (03/2017), and Type 1 diabetes (HCC).  Significant Hospital Events: Including procedures, antibiotic start and stop dates in addition to other pertinent events   2/13 2D echo > EF 65-70%, severely elevated PASP with estimated RVSP 65.33mmHg , mild TR.  2/14 Seen this am with no acute complaints, reports some continued cough   Interim History / Subjective:  States she feels ok this am. No new complaints.  Objective   Blood pressure (!) 148/92, pulse (!) 105, temperature 98.6 F (37 C), temperature source Oral, resp. rate 20, height 5\' 8"  (1.727 m), weight 84.9 kg, last menstrual period 03/14/2023, SpO2 94%.        Intake/Output Summary (Last 24 hours) at 03/19/2023 1411 Last data filed at 03/19/2023 1015 Gross per 24 hour  Intake 6 ml  Output --  Net 6 ml   Filed Weights   03/14/23 0812 03/19/23 0340  Weight: 87.5 kg 84.9 kg    Examination: General: Well appearing adult female sitting up on edge of bed in NAD  HEENT: [place]/AT, MM pink/moist, PERRL,  Neuro: Alert and oriented x3, non-focal  CV: s1s2 regular rate and rhythm, no murmur, rubs, or gallops,  PULM:  Bibasilar coarse crackles.  No wheezes. GI: soft, bowel sounds active in all 4 quadrants, non-tender, non-distended, tolerating oral diet Extremities: warm/dry, no edema.  No clubbing. Skin: no rashes or lesions  Resolved Hospital Problem list     Assessment & Plan:  FluA  with multifocal PNA  Suspect severe pulmonary HTN by echo  -Unclear etiology. Recent autoimmune w/u by nephrology negative. HIV neg. No sig valvular abnormalities on echo. Not obese, nothing to suggest OSA.   P: Continue supplemental oxygen wean as able  HF following, continue supportive care with plan for RHC once optimized  Follow LE dopplers  Follow autoimmune workup   Best Practice (right click and "Reselect all SmartList Selections" daily)   Per primary   Marcelle Smiling, MD Board Certified by the ABIM, Pulmonary Diseases & Critical Care Medicine  Personal contact information can be found on Amion  If no contact or response made please call [phone] 03/19/2023, 2:11 PM

## 2023-03-19 NOTE — Progress Notes (Signed)
PROGRESS NOTE        PATIENT DETAILS Name: Mandy Patel Age: 28 y.o. Sex: female Date of Birth: 1995/04/13 Admit Date: 03/14/2023 Admitting Physician Clydie Braun, MD WUJ:[id number], Genene Churn, NP  Brief Summary: Patient is a 28 y.o.  female with history of DM-1-presented with nausea/weakness-found to have DKA with AKI in the setting of influenza A infection.  Significant events: 2/10>> admit to [facility]-DKA/AKI/influenza-CXR without pneumonia. 2/12>> worsening SOB-Lasix x 1 dose-CXR with multifocal PNA. 2/13>>Rocephin/zithro started  Significant studies: 2/10>> CXR: No PNA 2/12>> CXR: Multifocal pneumonia 2/13>> echo: EF 65-70%, RVSP 65.7.  Significant microbiology data: 2/10>> influenza A PCR: Positive 2/10>> influenza B/RSV/COVID PCR: Negative  Procedures: None  Consults: PCCM Advanced heart failure team  Subjective: Feels better-less shortness of breath-no nausea no vomiting.  Claims diet has improved significantly-she is getting almost all of her meals.  Objective: Vitals: Blood pressure (!) 148/90, pulse (!) 101, temperature (!) 97.5 F (36.4 C), temperature source Oral, resp. rate 20, height 5\' 8"  (1.727 m), weight 84.9 kg, last menstrual period 03/14/2023, SpO2 94%.   Exam: Gen Exam:Alert awake-not in any distress HEENT:atraumatic, normocephalic Chest: B/L clear to auscultation anteriorly CVS:S1S2 regular Abdomen:soft non tender, non distended Extremities:no edema Neurology: Non focal Skin: no rash  Pertinent Labs/Radiology:    Latest Ref Rng & Units 03/19/2023    5:08 AM 03/18/2023    5:09 AM 03/17/2023    4:33 AM  CBC  WBC 4.0 - 10.5 K/uL 16.6  19.0  19.4   Hemoglobin 12.0 - 15.0 g/dL 8.3  8.5  8.9   Hematocrit 36.0 - 46.0 % 25.0  25.4  27.2   Platelets 150 - 400 K/uL 210  192  209     Lab Results  Component Value Date   NA 131 (L) 03/19/2023   K 3.7 03/19/2023   CL 104 03/19/2023   CO2 16 (L) 03/19/2023      Assessment/Plan: DKA Resolved Treated with IVF/IV insulin Transitioned to SQ insulin  AKI on CKD stage IIIb AKI likely hemodynamically mediated-the setting of DKA/poor appetite/ARB Recent autoimmune workup by outpatient nephrologist was negative-suspected diabetic nephropathy at baseline. Creatinine had improved but now is again wondering Volume status is stable Will restart gentle IV fluid hydration x 24 hours Ordered renal ultrasound-bladder not distended on exam Avoid nephrotoxic agents If renal function does not improve-will consult nephrology.   SIRS secondary to Influenza A infection Multifocal pneumonia Developed SOB on 2/12-repeat x-ray on 2/13 showed multifocal pneumonia-started on Rocephin/Zithromax Slowly improving-feels much better-either on room air on just 1-2 L of oxygen Completed Zithromax x 3 days-remains on Rocephin/Tamiflu.   Nausea Suspect this may be gastroparesis Has improved significantly after scheduled Reglan and small portion meals.  Hypokalemia Replete/recheck.  DM-1 (A1c 10.1 on 2/10) CBG stable Lantus 16 units+ SSI  Recent Labs    03/18/23 1633 03/18/23 2125 03/19/23 0753  GLUCAP 83 178* 134*    HTN BP stable Continue metoprolol/amlodipine ARB on hold  Severe pulmonary hypertension Seen on echocardiogram Unclear etiology-body habitus not consistent with OSA physiology, VQ scan negative, Dopplers negative, autoimmune workup so far negative.  Per cardiology this could be from high flow state in the setting of sepsis-recommendations are to treat underlying infectious issues-perhaps a repeat echo as an outpatient when she has recovered from acute illness.    Normocytic anemia Likely due  to IVF dilution/acute illness No evidence of blood loss Follow CBC.  BMI: Estimated body mass index is 28.46 kg/m as calculated from the following:   Height as of this encounter: 5\' 8"  (1.727 m).   Weight as of this encounter: 84.9 kg.   Code  status:   Code Status: Full Code   DVT Prophylaxis: heparin injection 5,000 Units Start: 03/14/23 1400   Family Communication: [phone] updated over the phone 2/13   Disposition Plan: Status is: Inpatient Remains inpatient appropriate because: Severity of illness   Planned Discharge Destination:Home   Diet: Diet Order             Diet Carb Modified Fluid consistency: Thin; Room service appropriate? Yes  Diet effective now                     Antimicrobial agents: Anti-infectives (From admission, onward)    Start     Dose/Rate Route Frequency Ordered Stop   03/17/23 0800  cefTRIAXone (ROCEPHIN) 2 g in sodium chloride 0.9 % 100 mL IVPB        2 g 200 mL/hr over 30 Minutes Intravenous Every 24 hours 03/17/23 0653     03/17/23 0800  azithromycin (ZITHROMAX) 500 mg in sodium chloride 0.9 % 250 mL IVPB        500 mg 250 mL/hr over 60 Minutes Intravenous Every 24 hours 03/17/23 0653 03/19/23 1014   03/15/23 0800  oseltamivir (TAMIFLU) capsule 30 mg       Note to Pharmacy: Tamiflu 30 mg BID for CrCl <  60 mL/min   30 mg Oral 2 times daily 03/15/23 0653 03/20/23 0959   03/14/23 1330  metroNIDAZOLE (FLAGYL) tablet 2,000 mg        2,000 mg Oral  Once 03/14/23 1326 03/14/23 1352        MEDICATIONS: Scheduled Meds:  amLODipine  5 mg Oral Daily   heparin  5,000 Units Subcutaneous Q8H   insulin aspart  0-15 Units Subcutaneous TID WC   insulin aspart  0-5 Units Subcutaneous QHS   insulin glargine-yfgn  16 Units Subcutaneous Daily   metoCLOPramide (REGLAN) injection  10 mg Intravenous TID AC   metoprolol succinate  50 mg Oral Daily   oseltamivir  30 mg Oral BID   pantoprazole (PROTONIX) IV  40 mg Intravenous Q24H   sodium bicarbonate  650 mg Oral BID   sodium chloride flush  3 mL Intravenous Q12H   Continuous Infusions:  sodium chloride Stopped (03/19/23 1013)   cefTRIAXone (ROCEPHIN)  IV Stopped (03/19/23 1015)   PRN Meds:.acetaminophen **OR**  acetaminophen, dextrose, hydrALAZINE, levalbuterol, ondansetron (ZOFRAN) IV, traMADol   I have personally reviewed following labs and imaging studies  LABORATORY DATA: CBC: Recent Labs  Lab 03/14/23 0759 03/14/23 0824 03/15/23 0439 03/16/23 0433 03/17/23 0433 03/18/23 0509 03/19/23 0508  WBC 14.7*  --  15.8* 16.0* 19.4* 19.0* 16.6*  NEUTROABS 12.3*  --   --   --   --   --   --   HGB 11.1*   < > 9.8* 10.6* 8.9* 8.5* 8.3*  HCT 33.3*   < > 29.3* 32.6* 27.2* 25.4* 25.0*  MCV 94.6  --  93.0 95.6 94.1 93.0 93.3  PLT 326  --  298 231 209 192 210   < > = values in this interval not displayed.    Basic Metabolic Panel: Recent Labs  Lab 03/15/23 0908 03/16/23 0433 03/17/23 0433 03/18/23 0509 03/19/23 0508  NA 135 135  135 132* 131*  K 3.7 3.8 3.5 3.1* 3.7  CL 107 102 102 100 104  CO2 18* 18* 20* 19* 16*  GLUCOSE 184* 145* 163* 83 151*  BUN 28* 23* 22* 23* 25*  CREATININE 3.05* 2.78* 2.89* 3.06* 3.46*  CALCIUM 8.1* 8.3* 7.8* 7.5* 7.6*  MG  --   --   --   --  1.8    GFR: Estimated Creatinine Clearance: 27.9 mL/min (A) (by C-G formula based on SCr of 3.46 mg/dL (H)).  Liver Function Tests: Recent Labs  Lab 03/14/23 0759  AST 11*  ALT 13  ALKPHOS 94  BILITOT 1.2  PROT 6.9  ALBUMIN 2.8*   Recent Labs  Lab 03/14/23 0759  LIPASE 21   No results for input(s): "AMMONIA" in the last 168 hours.  Coagulation Profile: No results for input(s): "INR", "PROTIME" in the last 168 hours.  Cardiac Enzymes: Recent Labs  Lab 03/17/23 1703  CKTOTAL 110    BNP (last 3 results) No results for input(s): "PROBNP" in the last 8760 hours.  Lipid Profile: No results for input(s): "CHOL", "HDL", "LDLCALC", "TRIG", "CHOLHDL", "LDLDIRECT" in the last 72 hours.  Thyroid Function Tests: No results for input(s): "TSH", "T4TOTAL", "FREET4", "T3FREE", "THYROIDAB" in the last 72 hours.  Anemia Panel: Recent Labs    03/17/23 1703  FERRITIN 156    Urine analysis:    Component  Value Date/Time   COLORURINE YELLOW 03/14/2023 1130   APPEARANCEUR CLOUDY (A) 03/14/2023 1130   LABSPEC 1.020 03/14/2023 1130   PHURINE 6.0 03/14/2023 1130   GLUCOSEU >=500 (A) 03/14/2023 1130   GLUCOSEU >=1000 12/01/2010 1051   HGBUR LARGE (A) 03/14/2023 1130   BILIRUBINUR NEGATIVE 03/14/2023 1130   KETONESUR 40 (A) 03/14/2023 1130   PROTEINUR >300 (A) 03/14/2023 1130   UROBILINOGEN 0.2 12/01/2010 1051   NITRITE NEGATIVE 03/14/2023 1130   LEUKOCYTESUR NEGATIVE 03/14/2023 1130    Sepsis Labs: Lactic Acid, Venous    Component Value Date/Time   LATICACIDVEN 0.9 06/23/2021 0335    MICROBIOLOGY: Recent Results (from the past 240 hours)  Resp panel by RT-PCR (RSV, Flu A&B, Covid) Anterior Nasal Swab     Status: Abnormal   Collection Time: 03/14/23  8:00 AM   Specimen: Anterior Nasal Swab  Result Value Ref Range Status   SARS Coronavirus 2 by RT PCR NEGATIVE NEGATIVE Final   Influenza A by PCR POSITIVE (A) NEGATIVE Final   Influenza B by PCR NEGATIVE NEGATIVE Final    Comment: (NOTE) The Xpert Xpress SARS-CoV-2/FLU/RSV plus assay is intended as an aid in the diagnosis of influenza from Nasopharyngeal swab specimens and should not be used as a sole basis for treatment. Nasal washings and aspirates are unacceptable for Xpert Xpress SARS-CoV-2/FLU/RSV testing.  Fact Sheet for Patients: BloggerCourse.com  Fact Sheet for Healthcare Providers: SeriousBroker.it  This test is not yet approved or cleared by the [place] FDA and has been authorized for detection and/or diagnosis of SARS-CoV-2 by FDA under an Emergency Use Authorization (EUA). This EUA will remain in effect (meaning this test can be used) for the duration of the COVID-19 declaration under Section 564(b)(1) of the Act, 21 U.S.C. section 360bbb-3(b)(1), unless the authorization is terminated or revoked.     Resp Syncytial Virus by PCR NEGATIVE NEGATIVE Final     Comment: (NOTE) Fact Sheet for Patients: BloggerCourse.com  Fact Sheet for Healthcare Providers: SeriousBroker.it  This test is not yet approved or cleared by the [place] and has been authorized  for detection and/or diagnosis of SARS-CoV-2 by FDA under an Emergency Use Authorization (EUA). This EUA will remain in effect (meaning this test can be used) for the duration of the COVID-19 declaration under Section 564(b)(1) of the Act, 21 U.S.C. section 360bbb-3(b)(1), unless the authorization is terminated or revoked.  Performed at [facility], [address] [place], [place] [address]   Respiratory (~20 pathogens) panel by PCR     Status: Abnormal   Collection Time: 03/15/23 12:29 PM   Specimen: Nasopharyngeal Swab; Respiratory  Result Value Ref Range Status   Adenovirus NOT DETECTED NOT DETECTED Final   Coronavirus 229E NOT DETECTED NOT DETECTED Final    Comment: (NOTE) The Coronavirus on the Respiratory Panel, DOES NOT test for the novel  Coronavirus (2019 nCoV)    Coronavirus HKU1 NOT DETECTED NOT DETECTED Final   Coronavirus NL63 NOT DETECTED NOT DETECTED Final   Coronavirus OC43 NOT DETECTED NOT DETECTED Final   Metapneumovirus NOT DETECTED NOT DETECTED Final   Rhinovirus / Enterovirus NOT DETECTED NOT DETECTED Final   Influenza A H1 2009 DETECTED (A) NOT DETECTED Final   Influenza B NOT DETECTED NOT DETECTED Final   Parainfluenza Virus 1 NOT DETECTED NOT DETECTED Final   Parainfluenza Virus 2 NOT DETECTED NOT DETECTED Final   Parainfluenza Virus 3 NOT DETECTED NOT DETECTED Final   Parainfluenza Virus 4 NOT DETECTED NOT DETECTED Final   Respiratory Syncytial Virus NOT DETECTED NOT DETECTED Final   Bordetella pertussis NOT DETECTED NOT DETECTED Final   Bordetella Parapertussis NOT DETECTED NOT DETECTED Final   Chlamydophila pneumoniae NOT DETECTED NOT DETECTED Final   Mycoplasma pneumoniae NOT DETECTED NOT  DETECTED Final    Comment: Performed at [facility], [address] [place], [place] [address]  Culture, blood (Routine X 2) w Reflex to ID Panel     Status: None (Preliminary result)   Collection Time: 03/17/23 11:14 AM   Specimen: BLOOD LEFT ARM  Result Value Ref Range Status   Specimen Description BLOOD LEFT ARM  Final   Special Requests   Final    BOTTLES DRAWN AEROBIC AND ANAEROBIC Blood Culture adequate volume   Culture   Final    NO GROWTH 2 DAYS Performed at [facility], [address], [place], [place] [address]    Report Status PENDING  Incomplete  Culture, blood (Routine X 2) w Reflex to ID Panel     Status: None (Preliminary result)   Collection Time: 03/17/23 11:14 AM   Specimen: BLOOD LEFT ARM  Result Value Ref Range Status   Specimen Description BLOOD LEFT ARM  Final   Special Requests   Final    BOTTLES DRAWN AEROBIC AND ANAEROBIC Blood Culture adequate volume   Culture   Final    NO GROWTH 2 DAYS Performed at [facility], [address], [place], [place] [address]    Report Status PENDING  Incomplete    RADIOLOGY STUDIES/RESULTS: NM Pulmonary Perfusion Result Date: 03/18/2023 CLINICAL DATA:  Dyspnea, hypoxia, pulmonary hypertension EXAM: NUCLEAR MEDICINE PERFUSION LUNG SCAN TECHNIQUE: Perfusion images were obtained in multiple projections after intravenous injection of radiopharmaceutical. Ventilation scans intentionally deferred if perfusion scan and chest x-ray adequate for interpretation during COVID 19 epidemic. RADIOPHARMACEUTICALS:  4.0 mCi Tc-61m MAA IV COMPARISON:  Findings are compared to chest radiograph 03/18/2023. No prior scintigraphic examination is available for comparison. FINDINGS: Perfusion only imaging demonstrates numerous in keeping peripheral relative perfusion defects throughout the lungs bilaterally. These  perfusion defects in many cases do not follow segmental margins, demonstrate no gradient, and are incomplete.  Altogether, the findings are of low probability for pulmonary embolism. IMPRESSION: Low probability for pulmonary embolism. Electronically Signed   By: Helyn Numbers M.D.   On: 03/18/2023 19:23   DG Chest Port 1V same Day Result Date: 03/18/2023 CLINICAL DATA:  Shortness of breath. EXAM: PORTABLE CHEST 1 VIEW COMPARISON:  Chest radiographs 02/27/2020, 06/23/2021, 03/14/2023, 03/16/2023 FINDINGS: Cardiac silhouette is again mildly enlarged. There is mild bilateral interstitial thickening and there are again diffuse bilateral patchy heterogeneous airspace opacities, not significantly changed from 03/16/2023 but new from 03/14/2023 and 06/23/2021. No pleural effusion pneumothorax. Minimal levocurvature of the upper thoracic spine. IMPRESSION: Diffuse bilateral patchy heterogeneous airspace opacities, not significantly changed from 03/16/2023 but new from 03/14/2023 and 06/23/2021. This may represent moderate interstitial pulmonary edema or multifocal pneumonia. Electronically Signed   By: Neita Garnet M.D.   On: 03/18/2023 13:42   VAS [place] LOWER EXTREMITY VENOUS (DVT) Result Date: 03/17/2023  Lower Venous DVT Study Patient Name:  Mandy Patel  Date of Exam:   03/17/2023 Medical Rec #: [id number]        Accession #:    [id number] Date of Birth: 1995-11-24       Patient Gender: F Patient Age:   59 years Exam Location:  [facility] Procedure:      VAS [place] LOWER EXTREMITY VENOUS (DVT) Referring Phys: Jeoffrey Massed --------------------------------------------------------------------------------  Indications: Swelling.  Risk Factors: None identified. Limitations: Patient positioning, patient actively vomiting. Comparison Study: No prior studies. Performing Technologist: Chanda Busing RVT  Examination Guidelines: A complete evaluation includes B-mode imaging, spectral Doppler, color Doppler, and power Doppler as needed of all accessible portions of each vessel. Bilateral testing is considered an integral  part of a complete examination. Limited examinations for reoccurring indications may be performed as noted. The reflux portion of the exam is performed with the patient in reverse Trendelenburg.  +---------+---------------+---------+-----------+----------+--------------+ RIGHT    CompressibilityPhasicitySpontaneityPropertiesThrombus Aging +---------+---------------+---------+-----------+----------+--------------+ CFV      Full           Yes      Yes                                 +---------+---------------+---------+-----------+----------+--------------+ SFJ      Full                                                        +---------+---------------+---------+-----------+----------+--------------+ FV Prox  Full                                                        +---------+---------------+---------+-----------+----------+--------------+ FV Mid   Full                                                        +---------+---------------+---------+-----------+----------+--------------+ FV DistalFull                                                        +---------+---------------+---------+-----------+----------+--------------+  PFV      Full                                                        +---------+---------------+---------+-----------+----------+--------------+ POP      Full           Yes      Yes                                 +---------+---------------+---------+-----------+----------+--------------+ PTV      Full                                                        +---------+---------------+---------+-----------+----------+--------------+ PERO     Full                                                        +---------+---------------+---------+-----------+----------+--------------+   +---------+---------------+---------+-----------+----------+--------------+ LEFT     CompressibilityPhasicitySpontaneityPropertiesThrombus Aging  +---------+---------------+---------+-----------+----------+--------------+ CFV      Full           Yes      Yes                                 +---------+---------------+---------+-----------+----------+--------------+ SFJ      Full                                                        +---------+---------------+---------+-----------+----------+--------------+ FV Prox  Full                                                        +---------+---------------+---------+-----------+----------+--------------+ FV Mid   Full                                                        +---------+---------------+---------+-----------+----------+--------------+ FV DistalFull                                                        +---------+---------------+---------+-----------+----------+--------------+ PFV      Full                                                        +---------+---------------+---------+-----------+----------+--------------+  POP      Full           Yes      Yes                                 +---------+---------------+---------+-----------+----------+--------------+ PTV      Full                                                        +---------+---------------+---------+-----------+----------+--------------+ PERO     Full                                                        +---------+---------------+---------+-----------+----------+--------------+     Summary: RIGHT: - There is no evidence of deep vein thrombosis in the lower extremity.  - No cystic structure found in the popliteal fossa.  LEFT: - There is no evidence of deep vein thrombosis in the lower extremity.  - No cystic structure found in the popliteal fossa.  *See table(s) above for measurements and observations. Electronically signed by Heath Lark on 03/17/2023 at 4:06:35 PM.    Final      LOS: 5 days   Jeoffrey Massed, MD  [facility]    To contact the attending  provider between 7A-7P or the covering provider during after hours 7P-7A, please log into the web site [email or web address] and access using universal [place] password for that web site. If you do not have the password, please call the hospital operator.  03/19/2023, 10:26 AM

## 2023-03-20 ENCOUNTER — Inpatient Hospital Stay (HOSPITAL_COMMUNITY): Payer: Managed Care, Other (non HMO)

## 2023-03-20 DIAGNOSIS — J09X1 Influenza due to identified novel influenza A virus with pneumonia: Secondary | ICD-10-CM | POA: Diagnosis not present

## 2023-03-20 DIAGNOSIS — E1010 Type 1 diabetes mellitus with ketoacidosis without coma: Secondary | ICD-10-CM | POA: Diagnosis not present

## 2023-03-20 DIAGNOSIS — N1832 Chronic kidney disease, stage 3b: Secondary | ICD-10-CM

## 2023-03-20 DIAGNOSIS — R0600 Dyspnea, unspecified: Secondary | ICD-10-CM | POA: Diagnosis not present

## 2023-03-20 DIAGNOSIS — E109 Type 1 diabetes mellitus without complications: Secondary | ICD-10-CM

## 2023-03-20 DIAGNOSIS — N189 Chronic kidney disease, unspecified: Secondary | ICD-10-CM | POA: Diagnosis not present

## 2023-03-20 DIAGNOSIS — E876 Hypokalemia: Secondary | ICD-10-CM | POA: Diagnosis not present

## 2023-03-20 DIAGNOSIS — I10 Essential (primary) hypertension: Secondary | ICD-10-CM

## 2023-03-20 DIAGNOSIS — D649 Anemia, unspecified: Secondary | ICD-10-CM | POA: Diagnosis not present

## 2023-03-20 DIAGNOSIS — N179 Acute kidney failure, unspecified: Secondary | ICD-10-CM | POA: Diagnosis not present

## 2023-03-20 LAB — BASIC METABOLIC PANEL
Anion gap: 12 (ref 5–15)
BUN: 27 mg/dL — ABNORMAL HIGH (ref 6–20)
CO2: 15 mmol/L — ABNORMAL LOW (ref 22–32)
Calcium: 7.8 mg/dL — ABNORMAL LOW (ref 8.9–10.3)
Chloride: 105 mmol/L (ref 98–111)
Creatinine, Ser: 3.83 mg/dL — ABNORMAL HIGH (ref 0.44–1.00)
GFR, Estimated: 16 mL/min — ABNORMAL LOW (ref >60)
Glucose, Bld: 108 mg/dL — ABNORMAL HIGH (ref 70–99)
Potassium: 3.1 mmol/L — ABNORMAL LOW (ref 3.5–5.1)
Sodium: 132 mmol/L — ABNORMAL LOW (ref 135–145)

## 2023-03-20 LAB — GLUCOSE, CAPILLARY
Glucose-Capillary: 155 mg/dL — ABNORMAL HIGH (ref 70–99)
Glucose-Capillary: 174 mg/dL — ABNORMAL HIGH (ref 70–99)
Glucose-Capillary: 162 mg/dL — ABNORMAL HIGH (ref 70–99)
Glucose-Capillary: 134 mg/dL — ABNORMAL HIGH (ref 70–99)

## 2023-03-20 MED ORDER — BUDESONIDE 0.25 MG/2ML IN SUSP
0.2500 mg | Freq: Two times a day (BID) | RESPIRATORY_TRACT | Status: DC
  Administered 2023-03-20 – 2023-03-22 (×5): 0.25 mg via RESPIRATORY_TRACT
  Filled 2023-03-20 (×5): qty 2

## 2023-03-20 MED ORDER — POTASSIUM CHLORIDE CRYS ER 20 MEQ PO TBCR
40.0000 meq | EXTENDED_RELEASE_TABLET | ORAL | Status: AC
  Administered 2023-03-20: 40 meq via ORAL
  Filled 2023-03-20: qty 2

## 2023-03-20 MED ORDER — LACTATED RINGERS IV SOLN: INTRAVENOUS | Status: DC

## 2023-03-20 MED ORDER — LEVALBUTEROL HCL 0.63 MG/3ML IN NEBU
0.6300 mg | INHALATION_SOLUTION | Freq: Three times a day (TID) | RESPIRATORY_TRACT | Status: DC
  Administered 2023-03-20 (×2): 0.63 mg via RESPIRATORY_TRACT
  Filled 2023-03-20 (×2): qty 3

## 2023-03-20 MED ORDER — POTASSIUM CHLORIDE CRYS ER 20 MEQ PO TBCR
40.0000 meq | EXTENDED_RELEASE_TABLET | Freq: Once | ORAL | Status: AC
  Administered 2023-03-20: 40 meq via ORAL
  Filled 2023-03-20: qty 2

## 2023-03-20 NOTE — Plan of Care (Signed)
  Problem: Health Behavior/Discharge Planning: Goal: Ability to manage health-related needs will improve Outcome: Progressing   Problem: Clinical Measurements: Goal: Will remain free from infection Outcome: Progressing   Problem: Activity: Goal: Risk for activity intolerance will decrease Outcome: Progressing   Problem: Coping: Goal: Level of anxiety will decrease Outcome: Progressing   

## 2023-03-20 NOTE — Consult Note (Addendum)
Renal Service Consult Note [facility]  Mandy Patel 03/20/2023 Mandy Krabbe, MD Requesting Physician: Dr. Jerral Ralph, [name].   Reason for Consult: Renal failure  HPI: The patient is a 28 y.o. year-old w/ PMH as below who presented on 03/14/23 after having N/V for 3 days, and also high BS's. Pt is diabetic and had not rx for 2 days. BS was > 600. Also was having chills, cough, diarrhea. Mild SOB. She became progressively weaker and couldn't even walk to the bathroom. In ED pt was afeb, HR 119, RR 24-31, BP 162/98, SpO2 normal on RA. WBC 14K, Hb 11, bun 33, creat 3.59, gluc 586, AG 24, BHB 7.05. CXR was negative. Pt was admitted and started on IV reglan, zofran, bolus 1.75 L of LR, and insulin IV protocol. Flu A test was positive. SIRS criteria were met.   Pt w/ hx of CKD 3b f/b Dr Thedore Mins at [facility].  Pt rec'd 1 liter LR bolus on 2/10. Has had various IVF"s including LR at 125, LR at 50 /hr and NS at 50 cc/hr as well. Total I/O = 5.9 L in and 1.9 L out = +4.0 L UOP 2/9, 2/10 - none UOP 2/12 - 800 cc UOP 2/13 - 1100 cc UOP 2/14, 2/15 and 2/16 - none recorded    Pt seen in room. She was having N/V for 3 days before then 2 days while there w/ some dry heaving. All this has resolved. Has had IV problems w/ "burning" and has some bruising on the L upper arm. She is eating well now and want's to go home.    PMH Type 1 DM DKA Pyelonephritis   ROS - denies CP, no joint pain, no HA, no blurry vision, no rash, no diarrhea, no nausea/ vomiting, no dysuria, no difficulty voiding   Past Medical History  Past Medical History:  Diagnosis Date   DKA (diabetic ketoacidosis) (HCC)    Pyelonephritis 03/2017   Type 1 diabetes (HCC)     diagnosed age 34yo   Past Surgical History  Past Surgical History:  Procedure Laterality Date   EYE MUSCLE SURGERY  approx 2010   bilat eye surgury, left exotropia worse;Dr Maple Hudson, opthomology   Family History  Family History  Problem Relation Age  of Onset   Diabetes Mother        type 2   Hypertension Father    Kidney disease Sister        had kidney transplant.   Diabetes Sister        type 1   Stroke Maternal Grandmother    Diabetes Maternal Grandmother    COPD Maternal Grandfather    Alcohol abuse Maternal Grandfather    Arthritis Paternal Grandmother    Social History  reports that she has never smoked. She has been exposed to tobacco smoke. She has never used smokeless tobacco. She reports current drug use. Drug: Marijuana. She reports that she does not drink alcohol. Allergies  Allergies  Allergen Reactions   Peanut Oil Other (See Comments)    Reaction to peanuts - gums feel numb and tingling   Other Swelling    Pecans caused throat swelling and weakness   Home medications Prior to Admission medications   Medication Sig Start Date End Date Taking? Authorizing Provider  atorvastatin (LIPITOR) 10 MG tablet Take 10 mg by mouth daily. 01/27/23  Yes [provider]  furosemide (LASIX) 20 MG tablet TAKE 1 TABLET(20 MG) BY MOUTH DAILY AS NEEDED Patient  taking differently: Take 20 mg by mouth daily as needed for fluid (ankle). 07/15/22  Yes Eden Emms, NP  ibuprofen (ADVIL) 200 MG tablet Take 400 mg by mouth daily as needed for mild pain (pain score 1-3).   Yes [provider]  insulin glargine (LANTUS SOLOSTAR) 100 UNIT/ML Solostar Pen Inject 14 Units into the skin at bedtime. Patient taking differently: Inject 16 Units into the skin at bedtime. 03/17/22  Yes Shamleffer, Konrad Dolores, MD  insulin lispro (HUMALOG) 100 UNIT/ML KwikPen Max daily 40 units Patient taking differently: 4-40 Units See admin instructions. Patient is taking 4-40 Units 3 (three) times daily. Max daily 40 units depending on her meals. 03/17/22  Yes Shamleffer, Konrad Dolores, MD  losartan (COZAAR) 25 MG tablet TAKE 1 TABLET(25 MG) BY MOUTH DAILY 01/17/23  Yes Eden Emms, NP  metoCLOPramide (REGLAN) 5 MG tablet Take 1 tablet (5  mg total) by mouth every 8 (eight) hours as needed for nausea. 01/04/23  Yes Eden Emms, NP  metoprolol succinate (TOPROL-XL) 50 MG 24 hr tablet TAKE 1 TABLET(50 MG) BY MOUTH DAILY WITH OR IMMEDIATELY FOLLOWING A MEAL 10/25/22  Yes Eden Emms, NP  Multiple Vitamins-Minerals (MULTIPLE VITAMINS/WOMENS PO) Take 1 tablet by mouth 2 (two) times daily.   Yes [provider]  sodium bicarbonate 650 MG tablet Take 1,300 mg by mouth 2 (two) times daily. 02/04/23  Yes [provider]  Vitamin D, Ergocalciferol, 50 MCG (2000 UT) CAPS Take 50 mcg by mouth in the morning.   Yes [provider]  blood glucose meter kit and supplies KIT Dispense based on patient and insurance preference. Use up to four times daily as directed. (FOR ICD-9 250.00, 250.01). Check sugars before meals 06/25/21   Alford Highland, MD  Continuous Blood Gluc Sensor (DEXCOM G7 SENSOR) MISC Change sensors every 10 days 04/27/22   Shamleffer, Konrad Dolores, MD  Insulin Pen Needle 31G X 5 MM MISC 1 Device by Does not apply route in the morning, at noon, in the evening, and at bedtime. 03/17/22   Shamleffer, Konrad Dolores, MD     Vitals:   03/20/23 1105 03/20/23 1205 03/20/23 1341 03/20/23 1603  BP:  127/71  (!) 143/80  Pulse:  (!) 105  (!) 103  Resp:  20  20  Temp:  98.2 F (36.8 C)  98.1 F (36.7 C)  TempSrc:  Oral  Oral  SpO2: 95% 92% 94%   Weight:      Height:       Exam Gen alert, no distress No rash, cyanosis or gangrene Sclera anicteric, throat clear  No jvd or bruits Chest clear bilat to bases, no rales/ wheezing RRR no MRG Abd soft ntnd no mass or ascites +bs GU defer MS no joint effusions or deformity Ext no LE or UE edema, no other edema Neuro is alert, Ox 3 , nf      Renal-related home meds: - lasix 20 every day prn  - advil prn  - losartan 25 every day - sod bicarb 1300 bid - metoprolol xl 50 every day - others: insulins, reglan prn, statin  Date   Creat  eGFR  (ml/min) 2010- 2017  0.40- 0.76 2019   0.60- 1.22 2020   0.45- 1.78 2021   0.32- 0.51 2022   0.46- 1.33 2023   0.77- 1.09 May- July 2024 1.49- 1.59 01/20/23  1.95  36 ml/min, from CE 03/13/22  3.59 2/11   3.05 2/12   2.78  2/13   2.89 2/14   3.06 2/15   3.46 03/20/23  3.83    UA 2/10 - prot > 300, many bact 0-5 rbc, 6-10 wbc  Renal US - no obstruction, mild ^echo bilat   2/10 CXR - wnl 2/12 CXR - sig bilat infiltrates, poss MF pna 2/14 CXR - no change, mod IS chf or pna 2/16 CXR - improving bilat opacities reflecting improving CHF vs MF pna  Assessment/ Plan: AKI on CKD 3b - b/l creatinine 1.95 from dec 2024, eGFR 36 ml/min. Creat here was 3.5 on admission 2/10, and 3.8 today in the setting of DKA and acute flu A infection w/ N/V/D prior to admission. CXR was normal on arrival, then diffuse bilat infiltrates and today CXR is sig improving, these coincide w/ fevers and wbc spiking about mid-admission. On exam looks euvolemic, not overloaded. Possibly a bit dry as she didn't get the usual multi-liter boluses on admission and she had sig GI losses at home it sounds like. No acei/ ARB/ contrast / nsaids here or at home. Bp's okay. Suspect AKI is hemodynamic, meaning she could still be dry. Will give IVF's at 75 cc/hr overnight and see if creatinine improves any.  Multifocal pna - improving wbc/ fever and CXR DKA / hx DMT1 - per primary md      Vinson Moselle  MD [facility] 03/20/2023, 4:43 PM  Recent Labs  Lab 03/14/23 0759 03/14/23 0824 03/18/23 0509 03/19/23 0508 03/20/23 0525  HGB 11.1*   < > 8.5* 8.3*  --   ALBUMIN 2.8*  --   --   --   --   CALCIUM 8.3*   < > 7.5* 7.6* 7.8*  CREATININE 3.59*   < > 3.06* 3.46* 3.83*  K 4.6   < > 3.1* 3.7 3.1*   < > = values in this interval not displayed.   Inpatient medications:  amLODipine  5 mg Oral Daily   budesonide (PULMICORT) nebulizer solution  0.25 mg Nebulization BID   heparin  5,000 Units Subcutaneous Q8H   insulin aspart  0-15 Units  Subcutaneous TID WC   insulin aspart  0-5 Units Subcutaneous QHS   insulin glargine-yfgn  16 Units Subcutaneous Daily   levalbuterol  0.63 mg Nebulization Q8H   metoCLOPramide (REGLAN) injection  10 mg Intravenous TID AC   metoprolol succinate  50 mg Oral Daily   pantoprazole  40 mg Oral Daily   potassium chloride  40 mEq Oral Q4H   sodium bicarbonate  650 mg Oral BID   sodium chloride flush  3 mL Intravenous Q12H    cefTRIAXone (ROCEPHIN)  IV 2 g (03/20/23 0843)   acetaminophen **OR** acetaminophen, dextrose, hydrALAZINE, levalbuterol, ondansetron (ZOFRAN) IV, traMADol

## 2023-03-20 NOTE — Progress Notes (Addendum)
PROGRESS NOTE        PATIENT DETAILS Name: Mandy Patel Age: 28 y.o. Sex: female Date of Birth: 1995/06/14 Admit Date: 03/14/2023 Admitting Physician Clydie Braun, MD ZOX:[id number], Genene Churn, NP  Brief Summary: Patient is a 28 y.o.  female with history of DM-1-presented with nausea/weakness-found to have DKA with AKI in the setting of influenza A infection.  Significant events: 2/10>> admit to [facility]-DKA/AKI/influenza-CXR without pneumonia. 2/12>> worsening SOB-Lasix x 1 dose-CXR with multifocal PNA. 2/13>>Rocephin/zithro started 2/15>>Renal ultrasound:no hydronephroses  Significant studies: 2/10>> CXR: No PNA 2/12>> CXR: Multifocal pneumonia 2/13>> echo: EF 65-70%, RVSP 65.7.  Significant microbiology data: 2/10>> influenza A PCR: Positive 2/10>> influenza B/RSV/COVID PCR: Negative  Procedures: None  Consults: PCCM Advanced heart failure team  Subjective: She feels better-her breathing has markedly improved-she is not able to walk to the bathroom.  She is mostly on room air-all on just 1-2 L of oxygen.  Objective: Vitals: Blood pressure 132/79, pulse 97, temperature 97.6 F (36.4 C), temperature source Oral, resp. rate (!) 22, height 5\' 8"  (1.727 m), weight 87.5 kg, last menstrual period 03/14/2023, SpO2 94%.   Exam: Gen Exam:Alert awake-not in any distress HEENT:atraumatic, normocephalic Chest: B/L clear to auscultation anteriorly CVS:S1S2 regular Abdomen:soft non tender, non distended Extremities:no edema Neurology: Non focal Skin: no rash  Pertinent Labs/Radiology:    Latest Ref Rng & Units 03/19/2023    5:08 AM 03/18/2023    5:09 AM 03/17/2023    4:33 AM  CBC  WBC 4.0 - 10.5 K/uL 16.6  19.0  19.4   Hemoglobin 12.0 - 15.0 g/dL 8.3  8.5  8.9   Hematocrit 36.0 - 46.0 % 25.0  25.4  27.2   Platelets 150 - 400 K/uL 210  192  209     Lab Results  Component Value Date   NA 132 (L) 03/20/2023   K 3.1 (L) 03/20/2023   CL 105  03/20/2023   CO2 15 (L) 03/20/2023     Assessment/Plan: DKA Resolved Treated with IVF/IV insulin Transitioned to SQ insulin  AKI on CKD stage IIIb AKI likely hemodynamically mediated-the setting of DKA/poor appetite/ARB-however after initial improvement-creatinine is uptrending again.  No response to IV fluids started yesterday. Recent autoimmune workup by outpatient nephrologist was negative-suspected diabetic nephropathy at baseline. Creatinine had initially improved-but now is again uptrending-no response to IV fluids.  Renal ultrasound negative for hydronephrosis. Nephrology consulted today  SIRS secondary to Influenza A infection Multifocal pneumonia Developed SOB on 2/12-repeat CXR on 2/13 showed multifocal PNA (initial CXR was negative).  Completed Tamiflu on 2/15-completed Zithromax x 3 days on 2/15-remains on Rocephin. Clinically better-hide on room air or on minimal amount of oxygen.  Leukocytosis slowly improving.  Nausea This is related to gastroparesis. Has improved significantly after scheduled Reglan and small portion meals.  Hypokalemia Continue to replete/recheck.  DM-1 (A1c 10.1 on 2/10) CBG stable Lantus 16 units+ SSI  Recent Labs    03/19/23 1139 03/19/23 1614 03/19/23 2104  GLUCAP 169* 180* 114*    HTN BP stable Continue metoprolol/amlodipine ARB on hold  Severe pulmonary hypertension Seen on echocardiogram Unclear etiology-body habitus not consistent with OSA physiology, VQ scan negative, Dopplers negative, autoimmune workup so far positive for RA factor-however she has no symptoms of RA.  Rest of autoimmune workup is negative so far. Per cardiology this could be from high flow  state in the setting of sepsis-recommendations are to treat underlying infectious issues-perhaps a repeat echo as an outpatient when she has recovered from acute illness.    Normocytic anemia Likely due to IVF dilution/acute illness No evidence of blood loss Follow  CBC.  BMI: Estimated body mass index is 29.33 kg/m as calculated from the following:   Height as of this encounter: 5\' 8"  (1.727 m).   Weight as of this encounter: 87.5 kg.   Code status:   Code Status: Full Code   DVT Prophylaxis: heparin injection 5,000 Units Start: 03/14/23 1400   Family Communication: [phone] left VM 2/15   Disposition Plan: Status is: Inpatient Remains inpatient appropriate because: Severity of illness   Planned Discharge Destination:Home   Diet: Diet Order             Diet Carb Modified Fluid consistency: Thin; Room service appropriate? Yes  Diet effective now                     Antimicrobial agents: Anti-infectives (From admission, onward)    Start     Dose/Rate Route Frequency Ordered Stop   03/20/23 1000  oseltamivir (TAMIFLU) capsule 30 mg  Status:  Discontinued       Note to Pharmacy: Tamiflu 30 mg BID for CrCl <  60 mL/min   30 mg Oral Daily 03/19/23 1126 03/19/23 1127   03/17/23 0800  cefTRIAXone (ROCEPHIN) 2 g in sodium chloride 0.9 % 100 mL IVPB        2 g 200 mL/hr over 30 Minutes Intravenous Every 24 hours 03/17/23 0653     03/17/23 0800  azithromycin (ZITHROMAX) 500 mg in sodium chloride 0.9 % 250 mL IVPB        500 mg 250 mL/hr over 60 Minutes Intravenous Every 24 hours 03/17/23 0653 03/19/23 1318   03/15/23 0800  oseltamivir (TAMIFLU) capsule 30 mg  Status:  Discontinued       Note to Pharmacy: Tamiflu 30 mg BID for CrCl <  60 mL/min   30 mg Oral 2 times daily 03/15/23 0653 03/19/23 1126   03/14/23 1330  metroNIDAZOLE (FLAGYL) tablet 2,000 mg        2,000 mg Oral  Once 03/14/23 1326 03/14/23 1352        MEDICATIONS: Scheduled Meds:  amLODipine  5 mg Oral Daily   heparin  5,000 Units Subcutaneous Q8H   insulin aspart  0-15 Units Subcutaneous TID WC   insulin aspart  0-5 Units Subcutaneous QHS   insulin glargine-yfgn  16 Units Subcutaneous Daily   metoCLOPramide (REGLAN) injection  10 mg Intravenous  TID AC   metoprolol succinate  50 mg Oral Daily   pantoprazole  40 mg Oral Daily   sodium bicarbonate  650 mg Oral BID   sodium chloride flush  3 mL Intravenous Q12H   Continuous Infusions:  cefTRIAXone (ROCEPHIN)  IV Stopped (03/19/23 1240)   PRN Meds:.acetaminophen **OR** acetaminophen, dextrose, hydrALAZINE, levalbuterol, ondansetron (ZOFRAN) IV, traMADol   I have personally reviewed following labs and imaging studies  LABORATORY DATA: CBC: Recent Labs  Lab 03/14/23 0759 03/14/23 0824 03/15/23 0439 03/16/23 0433 03/17/23 0433 03/18/23 0509 03/19/23 0508  WBC 14.7*  --  15.8* 16.0* 19.4* 19.0* 16.6*  NEUTROABS 12.3*  --   --   --   --   --   --   HGB 11.1*   < > 9.8* 10.6* 8.9* 8.5* 8.3*  HCT 33.3*   < > 29.3* 32.6*  27.2* 25.4* 25.0*  MCV 94.6  --  93.0 95.6 94.1 93.0 93.3  PLT 326  --  298 231 209 192 210   < > = values in this interval not displayed.    Basic Metabolic Panel: Recent Labs  Lab 03/16/23 0433 03/17/23 0433 03/18/23 0509 03/19/23 0508 03/20/23 0525  NA 135 135 132* 131* 132*  K 3.8 3.5 3.1* 3.7 3.1*  CL 102 102 100 104 105  CO2 18* 20* 19* 16* 15*  GLUCOSE 145* 163* 83 151* 108*  BUN 23* 22* 23* 25* 27*  CREATININE 2.78* 2.89* 3.06* 3.46* 3.83*  CALCIUM 8.3* 7.8* 7.5* 7.6* 7.8*  MG  --   --   --  1.8  --     GFR: Estimated Creatinine Clearance: 25.5 mL/min (A) (by C-G formula based on SCr of 3.83 mg/dL (H)).  Liver Function Tests: Recent Labs  Lab 03/14/23 0759  AST 11*  ALT 13  ALKPHOS 94  BILITOT 1.2  PROT 6.9  ALBUMIN 2.8*   Recent Labs  Lab 03/14/23 0759  LIPASE 21   No results for input(s): "AMMONIA" in the last 168 hours.  Coagulation Profile: No results for input(s): "INR", "PROTIME" in the last 168 hours.  Cardiac Enzymes: Recent Labs  Lab 03/17/23 1703  CKTOTAL 110    BNP (last 3 results) No results for input(s): "PROBNP" in the last 8760 hours.  Lipid Profile: No results for input(s): "CHOL", "HDL",  "LDLCALC", "TRIG", "CHOLHDL", "LDLDIRECT" in the last 72 hours.  Thyroid Function Tests: No results for input(s): "TSH", "T4TOTAL", "FREET4", "T3FREE", "THYROIDAB" in the last 72 hours.  Anemia Panel: Recent Labs    03/17/23 1703  FERRITIN 156    Urine analysis:    Component Value Date/Time   COLORURINE YELLOW 03/14/2023 1130   APPEARANCEUR CLOUDY (A) 03/14/2023 1130   LABSPEC 1.020 03/14/2023 1130   PHURINE 6.0 03/14/2023 1130   GLUCOSEU >=500 (A) 03/14/2023 1130   GLUCOSEU >=1000 12/01/2010 1051   HGBUR LARGE (A) 03/14/2023 1130   BILIRUBINUR NEGATIVE 03/14/2023 1130   KETONESUR 40 (A) 03/14/2023 1130   PROTEINUR >300 (A) 03/14/2023 1130   UROBILINOGEN 0.2 12/01/2010 1051   NITRITE NEGATIVE 03/14/2023 1130   LEUKOCYTESUR NEGATIVE 03/14/2023 1130    Sepsis Labs: Lactic Acid, Venous    Component Value Date/Time   LATICACIDVEN 0.9 06/23/2021 0335    MICROBIOLOGY: Recent Results (from the past 240 hours)  Resp panel by RT-PCR (RSV, Flu A&B, Covid) Anterior Nasal Swab     Status: Abnormal   Collection Time: 03/14/23  8:00 AM   Specimen: Anterior Nasal Swab  Result Value Ref Range Status   SARS Coronavirus 2 by RT PCR NEGATIVE NEGATIVE Final   Influenza A by PCR POSITIVE (A) NEGATIVE Final   Influenza B by PCR NEGATIVE NEGATIVE Final    Comment: (NOTE) The Xpert Xpress SARS-CoV-2/FLU/RSV plus assay is intended as an aid in the diagnosis of influenza from Nasopharyngeal swab specimens and should not be used as a sole basis for treatment. Nasal washings and aspirates are unacceptable for Xpert Xpress SARS-CoV-2/FLU/RSV testing.  Fact Sheet for Patients: BloggerCourse.com  Fact Sheet for Healthcare Providers: SeriousBroker.it  This test is not yet approved or cleared by the [place] FDA and has been authorized for detection and/or diagnosis of SARS-CoV-2 by FDA under an Emergency Use Authorization (EUA). This  EUA will remain in effect (meaning this test can be used) for the duration of the COVID-19 declaration under Section 564(b)(1) of  the Act, 21 U.S.C. section 360bbb-3(b)(1), unless the authorization is terminated or revoked.     Resp Syncytial Virus by PCR NEGATIVE NEGATIVE Final    Comment: (NOTE) Fact Sheet for Patients: BloggerCourse.com  Fact Sheet for Healthcare Providers: SeriousBroker.it  This test is not yet approved or cleared by the [place] FDA and has been authorized for detection and/or diagnosis of SARS-CoV-2 by FDA under an Emergency Use Authorization (EUA). This EUA will remain in effect (meaning this test can be used) for the duration of the COVID-19 declaration under Section 564(b)(1) of the Act, 21 U.S.C. section 360bbb-3(b)(1), unless the authorization is terminated or revoked.  Performed at [facility], [address], [place], [place] [address]   Respiratory (~20 pathogens) panel by PCR     Status: Abnormal   Collection Time: 03/15/23 12:29 PM   Specimen: Nasopharyngeal Swab; Respiratory  Result Value Ref Range Status   Adenovirus NOT DETECTED NOT DETECTED Final   Coronavirus 229E NOT DETECTED NOT DETECTED Final    Comment: (NOTE) The Coronavirus on the Respiratory Panel, DOES NOT test for the novel  Coronavirus (2019 nCoV)    Coronavirus HKU1 NOT DETECTED NOT DETECTED Final   Coronavirus NL63 NOT DETECTED NOT DETECTED Final   Coronavirus OC43 NOT DETECTED NOT DETECTED Final   Metapneumovirus NOT DETECTED NOT DETECTED Final   Rhinovirus / Enterovirus NOT DETECTED NOT DETECTED Final   Influenza A H1 2009 DETECTED (A) NOT DETECTED Final   Influenza B NOT DETECTED NOT DETECTED Final   Parainfluenza Virus 1 NOT DETECTED NOT DETECTED Final   Parainfluenza Virus 2 NOT DETECTED NOT DETECTED Final   Parainfluenza Virus 3 NOT DETECTED NOT DETECTED Final   Parainfluenza Virus 4 NOT DETECTED NOT  DETECTED Final   Respiratory Syncytial Virus NOT DETECTED NOT DETECTED Final   Bordetella pertussis NOT DETECTED NOT DETECTED Final   Bordetella Parapertussis NOT DETECTED NOT DETECTED Final   Chlamydophila pneumoniae NOT DETECTED NOT DETECTED Final   Mycoplasma pneumoniae NOT DETECTED NOT DETECTED Final    Comment: Performed at [facility], [address] [place], [place] [address]  Culture, blood (Routine X 2) w Reflex to ID Panel     Status: None (Preliminary result)   Collection Time: 03/17/23 11:14 AM   Specimen: BLOOD LEFT ARM  Result Value Ref Range Status   Specimen Description BLOOD LEFT ARM  Final   Special Requests   Final    BOTTLES DRAWN AEROBIC AND ANAEROBIC Blood Culture adequate volume   Culture   Final    NO GROWTH 2 DAYS Performed at [facility], [address], [place], [place] [address]    Report Status PENDING  Incomplete  Culture, blood (Routine X 2) w Reflex to ID Panel     Status: None (Preliminary result)   Collection Time: 03/17/23 11:14 AM   Specimen: BLOOD LEFT ARM  Result Value Ref Range Status   Specimen Description BLOOD LEFT ARM  Final   Special Requests   Final    BOTTLES DRAWN AEROBIC AND ANAEROBIC Blood Culture adequate volume   Culture   Final    NO GROWTH 2 DAYS Performed at [facility], [address] [place], [place] [address]    Report Status PENDING  Incomplete    RADIOLOGY STUDIES/RESULTS: US RENAL Result Date: 03/19/2023 CLINICAL DATA:  [id number] AKI (acute kidney injury) (HCC) [id number] EXAM: RENAL / URINARY TRACT ULTRASOUND COMPLETE COMPARISON:  July 22, 2022, February 27, 2020  FINDINGS: Right Kidney: Renal measurements: 14.1 x 7.8 x 7.6 cm = volume: 438 mL. Echogenicity is mildly increased. No mass or definitive hydronephrosis visualized. Similar mild pelviectasis. Left Kidney: Renal measurements: 13.2 x 7.1 x 5.5 cm = volume: 268 mL. Echogenicity is mildly increased. No mass. Mild pelviectasis of the inferior pole,  favored similar compared to prior cross sectional imaging. Bladder: Appears normal for degree of bladder distention. Other: None. IMPRESSION: 1. Mildly increased echogenicity of the bilateral kidneys as can be seen in medical renal disease. 2. Mild bilateral pelviectasis, favored similar compared to prior cross sectional imaging. Electronically Signed   By: Meda Klinefelter M.D.   On: 03/19/2023 16:55   NM Pulmonary Perfusion Result Date: 03/18/2023 CLINICAL DATA:  Dyspnea, hypoxia, pulmonary hypertension EXAM: NUCLEAR MEDICINE PERFUSION LUNG SCAN TECHNIQUE: Perfusion images were obtained in multiple projections after intravenous injection of radiopharmaceutical. Ventilation scans intentionally deferred if perfusion scan and chest x-ray adequate for interpretation during COVID 19 epidemic. RADIOPHARMACEUTICALS:  4.0 mCi Tc-18m MAA IV COMPARISON:  Findings are compared to chest radiograph 03/18/2023. No prior scintigraphic examination is available for comparison. FINDINGS: Perfusion only imaging demonstrates numerous in keeping peripheral relative perfusion defects throughout the lungs bilaterally. These perfusion defects in many cases do not follow segmental margins, demonstrate no gradient, and are incomplete. Altogether, the findings are of low probability for pulmonary embolism. IMPRESSION: Low probability for pulmonary embolism. Electronically Signed   By: Helyn Numbers M.D.   On: 03/18/2023 19:23   DG Chest Port 1V same Day Result Date: 03/18/2023 CLINICAL DATA:  Shortness of breath. EXAM: PORTABLE CHEST 1 VIEW COMPARISON:  Chest radiographs 02/27/2020, 06/23/2021, 03/14/2023, 03/16/2023 FINDINGS: Cardiac silhouette is again mildly enlarged. There is mild bilateral interstitial thickening and there are again diffuse bilateral patchy heterogeneous airspace opacities, not significantly changed from 03/16/2023 but new from 03/14/2023 and 06/23/2021. No pleural effusion pneumothorax. Minimal levocurvature  of the upper thoracic spine. IMPRESSION: Diffuse bilateral patchy heterogeneous airspace opacities, not significantly changed from 03/16/2023 but new from 03/14/2023 and 06/23/2021. This may represent moderate interstitial pulmonary edema or multifocal pneumonia. Electronically Signed   By: Neita Garnet M.D.   On: 03/18/2023 13:42     LOS: 6 days   Jeoffrey Massed, MD  [facility]    To contact the attending provider between 7A-7P or the covering provider during after hours 7P-7A, please log into the web site [email or web address] and access using universal [place] password for that web site. If you do not have the password, please call the hospital operator.  03/20/2023, 8:43 AM

## 2023-03-20 NOTE — Progress Notes (Deleted)
03/20/2023 0700-1900 All carting completed by Nyoka Lint [id number] under charge nurse supervision. Access for Nyoka Lint was unavailable

## 2023-03-20 NOTE — Progress Notes (Signed)
   NAME:  Mandy Patel, MRN:  [id number], DOB:  10-11-1995, LOS: 6 ADMISSION DATE:  03/14/2023, CONSULTATION DATE:  03/17/23 REFERRING MD:  ghimire ([facility]), CHIEF COMPLAINT:  pulm htn    History of Present Illness:  27yo female with hx CKD3, DM1 admitted 2/10 with DKA in setting fluA infection. She developed worsening dyspnea and concern for PNA on CXR. Echo was obtained which revealed severely elevated PA pressure and pulmonary was consulted.   Pertinent  Medical History   has a past medical history of DKA (diabetic ketoacidosis) (HCC), Pyelonephritis (03/2017), and Type 1 diabetes (HCC).  Significant Hospital Events: Including procedures, antibiotic start and stop dates in addition to other pertinent events   2/13 2D echo > EF 65-70%, severely elevated PASP with estimated RVSP 65.23mmHg , mild TR.  2/14 Seen this am with no acute complaints, reports some continued cough   Interim History / Subjective:  States she feels ok this am. No new complaints.  Objective   Blood pressure 132/79, pulse 97, temperature 97.6 F (36.4 C), temperature source Oral, resp. rate (!) 22, height 5\' 8"  (1.727 m), weight 87.5 kg, last menstrual period 03/14/2023, SpO2 95%.        Intake/Output Summary (Last 24 hours) at 03/20/2023 1135 Last data filed at 03/19/2023 2107 Gross per 24 hour  Intake 3 ml  Output --  Net 3 ml   Filed Weights   03/14/23 0812 03/19/23 0340 03/20/23 0447  Weight: 87.5 kg 84.9 kg 87.5 kg    Examination: General: Well appearing adult female sitting up on edge of bed in NAD  HEENT: [place]/AT, MM pink/moist, PERRL,  Neuro: Alert and oriented x3, non-focal  CV: s1s2 regular rate and rhythm, no murmur, rubs, or gallops,  PULM:  Bibasilar coarse crackles.  No wheezes. GI: soft, bowel sounds active in all 4 quadrants, non-tender, non-distended, tolerating oral diet Extremities: warm/dry, no edema.  No clubbing. Skin: no rashes or lesions  Resolved Hospital Problem list      Assessment & Plan:  FluA with multifocal PNA  Suspect severe pulmonary HTN by echo  -Unclear etiology. Recent autoimmune w/u by nephrology negative. HIV neg. No sig valvular abnormalities on echo. Not obese, nothing to suggest OSA.   Titrate supplemental oxygen to keep SpO2 95+%. HF following, continue supportive care with plan for RHC once optimized  V/Q scan (2/14): low probability for PE. Rheumatoid factor 30.4.  ANCAs were negative.  No definite arthritic findings of physical exam.  Rheumatoid interstitial lung disease is a consideration.  Will add Pulmicort, given her history of asthma for now.  Systemic steroids may need to be considered. Stop maintenance IV fluids.  Creatinine worsening, now up to 3.83 despite fluids.  May even need a dose of diuretic.  However, the patient is noted to be hypokalemic. CXR in AM.  Check BNP in AM.  Will likely benefit CT chest without contrast.  ASTHMA Change Xopenex to scheduled dosing. Pulmicort 0.5 mg nebulized twice daily. Methacholine challenge test as an outpatient.  Best Practice (right click and "Reselect all SmartList Selections" daily)   HYPOKALEMIA TYPE 1 DIABETES ACUTE ON CHRONIC STAGE IIIb KIDNEY DISEASE HYPERTENSION Per primary   Marcelle Smiling, MD Board Certified by the ABIM, Pulmonary Diseases & Critical Care Medicine  Personal contact information can be found on Amion  If no contact or response made please call [phone] 03/20/2023, 11:35 AM

## 2023-03-21 ENCOUNTER — Telehealth: Payer: Self-pay | Admitting: Acute Care

## 2023-03-21 DIAGNOSIS — I2721 Secondary pulmonary arterial hypertension: Secondary | ICD-10-CM | POA: Diagnosis not present

## 2023-03-21 DIAGNOSIS — N179 Acute kidney failure, unspecified: Secondary | ICD-10-CM | POA: Diagnosis not present

## 2023-03-21 DIAGNOSIS — E1010 Type 1 diabetes mellitus with ketoacidosis without coma: Secondary | ICD-10-CM | POA: Diagnosis not present

## 2023-03-21 DIAGNOSIS — R0600 Dyspnea, unspecified: Secondary | ICD-10-CM | POA: Diagnosis not present

## 2023-03-21 DIAGNOSIS — J09X1 Influenza due to identified novel influenza A virus with pneumonia: Secondary | ICD-10-CM | POA: Diagnosis not present

## 2023-03-21 DIAGNOSIS — J101 Influenza due to other identified influenza virus with other respiratory manifestations: Secondary | ICD-10-CM | POA: Diagnosis not present

## 2023-03-21 LAB — BRAIN NATRIURETIC PEPTIDE: B Natriuretic Peptide: 106 pg/mL — ABNORMAL HIGH (ref 0.0–100.0)

## 2023-03-21 LAB — CBC
HCT: 23.2 % — ABNORMAL LOW (ref 36.0–46.0)
Hemoglobin: 7.7 g/dL — ABNORMAL LOW (ref 12.0–15.0)
MCH: 30.6 pg (ref 26.0–34.0)
MCHC: 33.2 g/dL (ref 30.0–36.0)
MCV: 92.1 fL (ref 80.0–100.0)
Platelets: 340 10*3/uL (ref 150–400)
RBC: 2.52 MIL/uL — ABNORMAL LOW (ref 3.87–5.11)
RDW: 12.5 % (ref 11.5–15.5)
WBC: 14.2 10*3/uL — ABNORMAL HIGH (ref 4.0–10.5)
nRBC: 0 % (ref 0.0–0.2)

## 2023-03-21 LAB — RENAL FUNCTION PANEL
Albumin: <1.5 g/dL — ABNORMAL LOW (ref 3.5–5.0)
Anion gap: 11 (ref 5–15)
BUN: 26 mg/dL — ABNORMAL HIGH (ref 6–20)
CO2: 18 mmol/L — ABNORMAL LOW (ref 22–32)
Calcium: 8 mg/dL — ABNORMAL LOW (ref 8.9–10.3)
Chloride: 105 mmol/L (ref 98–111)
Creatinine, Ser: 3.56 mg/dL — ABNORMAL HIGH (ref 0.44–1.00)
GFR, Estimated: 17 mL/min — ABNORMAL LOW (ref >60)
Glucose, Bld: 83 mg/dL (ref 70–99)
Phosphorus: 4.3 mg/dL (ref 2.5–4.6)
Potassium: 3 mmol/L — ABNORMAL LOW (ref 3.5–5.1)
Sodium: 134 mmol/L — ABNORMAL LOW (ref 135–145)

## 2023-03-21 LAB — CYCLIC CITRUL PEPTIDE ANTIBODY, IGG/IGA: CCP Antibodies IgG/IgA: 4 U (ref 0–19)

## 2023-03-21 LAB — GLUCOSE, CAPILLARY
Glucose-Capillary: 81 mg/dL (ref 70–99)
Glucose-Capillary: 123 mg/dL — ABNORMAL HIGH (ref 70–99)
Glucose-Capillary: 129 mg/dL — ABNORMAL HIGH (ref 70–99)
Glucose-Capillary: 78 mg/dL (ref 70–99)

## 2023-03-21 MED ORDER — POTASSIUM CHLORIDE CRYS ER 20 MEQ PO TBCR
40.0000 meq | EXTENDED_RELEASE_TABLET | Freq: Four times a day (QID) | ORAL | Status: AC
  Administered 2023-03-21 (×2): 40 meq via ORAL
  Filled 2023-03-21 (×2): qty 2

## 2023-03-21 NOTE — Progress Notes (Signed)
NAME:  Mandy Patel, MRN:  [id number], DOB:  03-29-1995, LOS: 7 ADMISSION DATE:  03/14/2023, CONSULTATION DATE:  03/17/23 REFERRING MD:  ghimire ([facility]), CHIEF COMPLAINT:  pulm htn    History of Present Illness:  28yo female with hx CKD3, DM1 admitted 2/10 with DKA in setting fluA infection. She developed worsening dyspnea and concern for PNA on CXR. Echo was obtained which revealed severely elevated PA pressure and pulmonary was consulted.   Pertinent  Medical History   has a past medical history of DKA (diabetic ketoacidosis) (HCC), Pyelonephritis (03/2017), and Type 1 diabetes (HCC).  Significant Hospital Events: Including procedures, antibiotic start and stop dates in addition to other pertinent events   2/13 2D echo > EF 65-70%, severely elevated PASP with estimated RVSP 65.49mmHg , mild TR.  2/14 Seen this am with no acute complaints, reports some continued cough   Interim History / Subjective:  Off oxygen.  Feels better.  Anticipating discharge home tomorrow.  Objective   Blood pressure (!) 145/79, pulse (!) 105, temperature 98 F (36.7 C), temperature source Oral, resp. rate (!) 27, height 5\' 8"  (1.727 m), weight 87.4 kg, last menstrual period 03/14/2023, SpO2 95%.        Intake/Output Summary (Last 24 hours) at 03/21/2023 1640 Last data filed at 03/20/2023 2132 Gross per 24 hour  Intake 3 ml  Output --  Net 3 ml   Filed Weights   03/19/23 0340 03/20/23 0447 03/21/23 0500  Weight: 84.9 kg 87.5 kg 87.4 kg    Examination: General: Well appearing adult female sitting up on edge of bed in NAD  HEENT: [place]/AT, MM pink/moist, PERRL,  Neuro: Alert and oriented x3, non-focal  CV: s1s2 regular rate and rhythm, no murmur, rubs, or gallops,  PULM:  Significantly improved crackles, albeit still present, now LEFT > RIGHT.  No wheezes. GI: soft, bowel sounds active in all 4 quadrants, non-tender, non-distended, tolerating oral diet Extremities: warm/dry, no edema.  No  clubbing. Skin: no rashes or lesions  Resolved Hospital Problem list     Assessment & Plan:  Multifocal pneumonia secondary to influenza A Possible pulmonary hypertension (based on echocardiographic criteria) -Unclear etiology.  Acute respiratory illness can be a confounder. -Recent autoimmune w/u by nephrology negative.  HIV neg.  No sig valvular abnormalities on echo.  Not obese, nothing to suggest OSA.   -V/Q scan (2/14): low probability for PE. -Rheumatoid factor 30.4.  ANCAs were negative.  No definite arthritic findings of physical exam. Back on room air.  Ok to discharge home from pulmonary standpoint. Please repeat CXR in AM and set patient up for outpatient pulmonary follow up at 6 weeks after discharge to re-evaluate for need of diagnostic evaluation for possible interstitial lung disease and pulmonary hypertension.  She will need CXR prior to outpatient visit.  Persistent crackles on exam may prompt recommendation for chest CT imaging.  Also, she has been advised that she will likely need to undergo complete PFTs as an outpatient.  ASTHMA Continue Xopenex/Pulmicort. Breo 200/25 or equivalent LABA/high dose ICS at discharge.  May need methacholine challenge test as an outpatient. Smoking cessation counseling provided.  Best Practice (right click and "Reselect all SmartList Selections" daily)   HYPOKALEMIA TYPE 1 DIABETES ACUTE ON CHRONIC STAGE IIIb KIDNEY DISEASE HYPERTENSION Per primary   Marcelle Smiling, MD Board Certified by the ABIM, Pulmonary Diseases & Critical Care Medicine  Personal contact information can be found on Amion  If no contact or response made please call  [phone] 03/21/2023, 4:57 PM

## 2023-03-21 NOTE — Plan of Care (Signed)
Problem: Clinical Measurements: Goal: Ability to maintain clinical measurements within normal limits will improve Outcome: Progressing Goal: Will remain free from infection Outcome: Progressing Goal: Respiratory complications will improve Outcome: Progressing   Problem: Activity: Goal: Risk for activity intolerance will decrease Outcome: Progressing   

## 2023-03-21 NOTE — Progress Notes (Signed)
PROGRESS NOTE        PATIENT DETAILS Name: Mandy Patel Age: 28 y.o. Sex: female Date of Birth: February 19, 1995 Admit Date: 03/14/2023 Admitting Physician Clydie Braun, MD ZOX:[id number], Genene Churn, NP  Brief Summary: Patient is a 28 y.o.  female with history of DM-1-presented with nausea/weakness-found to have DKA with AKI in the setting of influenza A infection.  Significant events: 2/10>> admit to [facility]-DKA/AKI/influenza-CXR without pneumonia. 2/12>> worsening SOB-Lasix x 1 dose-CXR with multifocal PNA. 2/13>>Rocephin/zithro started 2/15>>Renal ultrasound:no hydronephroses  Significant studies: 2/10>> CXR: No PNA 2/12>> CXR: Multifocal pneumonia 2/13>> echo: EF 65-70%, RVSP 65.7.  Significant microbiology data: 2/10>> influenza A PCR: Positive 2/10>> influenza B/RSV/COVID PCR: Negative  Procedures: None  Consults: PCCM Advanced heart failure team  Subjective: Wants to go home-feels much better-on room air this am  Objective: Vitals: Blood pressure 128/84, pulse (!) 105, temperature (!) 97.5 F (36.4 C), temperature source Oral, resp. rate (!) 21, height 5\' 8"  (1.727 m), weight 87.4 kg, last menstrual period 03/14/2023, SpO2 95%.   Exam: Gen Exam:Alert awake-not in any distress HEENT:atraumatic, normocephalic Chest: B/L clear to auscultation anteriorly CVS:S1S2 regular Abdomen:soft non tender, non distended Extremities:no edema Neurology: Non focal Skin: no rash  Pertinent Labs/Radiology:    Latest Ref Rng & Units 03/21/2023    4:02 AM 03/19/2023    5:08 AM 03/18/2023    5:09 AM  CBC  WBC 4.0 - 10.5 K/uL 14.2  16.6  19.0   Hemoglobin 12.0 - 15.0 g/dL 7.7  8.3  8.5   Hematocrit 36.0 - 46.0 % 23.2  25.0  25.4   Platelets 150 - 400 K/uL 340  210  192     Lab Results  Component Value Date   NA 134 (L) 03/21/2023   K 3.0 (L) 03/21/2023   CL 105 03/21/2023   CO2 18 (L) 03/21/2023     Assessment/Plan: DKA Resolved Treated with  IVF/IV insulin Transitioned to SQ insulin  AKI on CKD stage IIIb AKI likely hemodynamically mediated-the setting of DKA/poor appetite/ARB-however after initial improvement-creatinine started uptrending again-looks like with supportive care-creatinine has now plateaued and is downtrending Stop IV fluids-follow electrolytes again tomorrow morning. Continue to avoid nephrotoxic agents Recent autoimmune workup negative Renal ultrasound was negative for hydronephrosis.  Nephrology following.  SIRS secondary to Influenza A infection Multifocal pneumonia Developed SOB on 2/12-repeat CXR on 2/13 showed multifocal PNA (initial CXR was negative).  Completed Tamiflu on 2/15-completed Zithromax x 3 days on 2/15-remains on Rocephin. Clinically better-hide on room air or on minimal amount of oxygen.  Leukocytosis slowly improving.  Nausea This is related to gastroparesis. Has improved significantly after scheduled Reglan and small portion meals.  Hypokalemia Replete/recheck.  DM-1 (A1c 10.1 on 2/10) CBG stable Lantus 16 units+ SSI  Recent Labs    03/20/23 1605 03/20/23 2127 03/21/23 0730  GLUCAP 162* 134* 81    HTN BP stable Continue metoprolol/amlodipine ARB on hold  Severe pulmonary hypertension Seen on echocardiogram Unclear etiology-body habitus not consistent with OSA physiology, VQ scan negative, Dopplers negative, autoimmune workup so far positive for RA factor-however she has no symptoms of RA.  Rest of autoimmune workup is negative so far. Per cardiology this could be from high flow state in the setting of sepsis-recommendations are to treat underlying infectious issues-perhaps a repeat echo as an outpatient when she has recovered from acute  illness.    Normocytic anemia Likely due to IVF dilution/acute illness No evidence of blood loss Follow CBC.  BMI: Estimated body mass index is 29.3 kg/m as calculated from the following:   Height as of this encounter: 5\' 8"  (1.727  m).   Weight as of this encounter: 87.4 kg.   Code status:   Code Status: Full Code   DVT Prophylaxis: heparin injection 5,000 Units Start: 03/14/23 1400   Family Communication: Frida Wahlstrom [phone] updated  2/15   Disposition Plan: Status is: Inpatient Remains inpatient appropriate because: Severity of illness   Planned Discharge Destination:Home   Diet: Diet Order             Diet Carb Modified Fluid consistency: Thin; Room service appropriate? Yes  Diet effective now                     Antimicrobial agents: Anti-infectives (From admission, onward)    Start     Dose/Rate Route Frequency Ordered Stop   03/20/23 1000  oseltamivir (TAMIFLU) capsule 30 mg  Status:  Discontinued       Note to Pharmacy: Tamiflu 30 mg BID for CrCl <  60 mL/min   30 mg Oral Daily 03/19/23 1126 03/19/23 1127   03/17/23 0800  cefTRIAXone (ROCEPHIN) 2 g in sodium chloride 0.9 % 100 mL IVPB        2 g 200 mL/hr over 30 Minutes Intravenous Every 24 hours 03/17/23 0653     03/17/23 0800  azithromycin (ZITHROMAX) 500 mg in sodium chloride 0.9 % 250 mL IVPB        500 mg 250 mL/hr over 60 Minutes Intravenous Every 24 hours 03/17/23 0653 03/19/23 1318   03/15/23 0800  oseltamivir (TAMIFLU) capsule 30 mg  Status:  Discontinued       Note to Pharmacy: Tamiflu 30 mg BID for CrCl <  60 mL/min   30 mg Oral 2 times daily 03/15/23 0653 03/19/23 1126   03/14/23 1330  metroNIDAZOLE (FLAGYL) tablet 2,000 mg        2,000 mg Oral  Once 03/14/23 1326 03/14/23 1352        MEDICATIONS: Scheduled Meds:  amLODipine  5 mg Oral Daily   budesonide (PULMICORT) nebulizer solution  0.25 mg Nebulization BID   heparin  5,000 Units Subcutaneous Q8H   insulin aspart  0-15 Units Subcutaneous TID WC   insulin aspart  0-5 Units Subcutaneous QHS   insulin glargine-yfgn  16 Units Subcutaneous Daily   levalbuterol  0.63 mg Nebulization Q8H   metoCLOPramide (REGLAN) injection  10 mg Intravenous TID AC    metoprolol succinate  50 mg Oral Daily   pantoprazole  40 mg Oral Daily   potassium chloride  40 mEq Oral Q6H   sodium bicarbonate  650 mg Oral BID   sodium chloride flush  3 mL Intravenous Q12H   Continuous Infusions:  cefTRIAXone (ROCEPHIN)  IV 2 g (03/21/23 0822)   lactated ringers 75 mL/hr at 03/21/23 0657   PRN Meds:.acetaminophen **OR** acetaminophen, dextrose, hydrALAZINE, levalbuterol, ondansetron (ZOFRAN) IV, traMADol   I have personally reviewed following labs and imaging studies  LABORATORY DATA: CBC: Recent Labs  Lab 03/16/23 0433 03/17/23 0433 03/18/23 0509 03/19/23 0508 03/21/23 0402  WBC 16.0* 19.4* 19.0* 16.6* 14.2*  HGB 10.6* 8.9* 8.5* 8.3* 7.7*  HCT 32.6* 27.2* 25.4* 25.0* 23.2*  MCV 95.6 94.1 93.0 93.3 92.1  PLT 231 209 192 210 340    Basic Metabolic Panel:  Recent Labs  Lab 03/17/23 0433 03/18/23 0509 03/19/23 0508 03/20/23 0525 03/21/23 0402  NA 135 132* 131* 132* 134*  K 3.5 3.1* 3.7 3.1* 3.0*  CL 102 100 104 105 105  CO2 20* 19* 16* 15* 18*  GLUCOSE 163* 83 151* 108* 83  BUN 22* 23* 25* 27* 26*  CREATININE 2.89* 3.06* 3.46* 3.83* 3.56*  CALCIUM 7.8* 7.5* 7.6* 7.8* 8.0*  MG  --   --  1.8  --   --   PHOS  --   --   --   --  4.3    GFR: Estimated Creatinine Clearance: 27.5 mL/min (A) (by C-G formula based on SCr of 3.56 mg/dL (H)).  Liver Function Tests: Recent Labs  Lab 03/21/23 0402  ALBUMIN <1.5*   No results for input(s): "LIPASE", "AMYLASE" in the last 168 hours.  No results for input(s): "AMMONIA" in the last 168 hours.  Coagulation Profile: No results for input(s): "INR", "PROTIME" in the last 168 hours.  Cardiac Enzymes: Recent Labs  Lab 03/17/23 1703  CKTOTAL 110    BNP (last 3 results) No results for input(s): "PROBNP" in the last 8760 hours.  Lipid Profile: No results for input(s): "CHOL", "HDL", "LDLCALC", "TRIG", "CHOLHDL", "LDLDIRECT" in the last 72 hours.  Thyroid Function Tests: No results for  input(s): "TSH", "T4TOTAL", "FREET4", "T3FREE", "THYROIDAB" in the last 72 hours.  Anemia Panel: No results for input(s): "VITAMINB12", "FOLATE", "FERRITIN", "TIBC", "IRON", "RETICCTPCT" in the last 72 hours.   Urine analysis:    Component Value Date/Time   COLORURINE YELLOW 03/14/2023 1130   APPEARANCEUR CLOUDY (A) 03/14/2023 1130   LABSPEC 1.020 03/14/2023 1130   PHURINE 6.0 03/14/2023 1130   GLUCOSEU >=500 (A) 03/14/2023 1130   GLUCOSEU >=1000 12/01/2010 1051   HGBUR LARGE (A) 03/14/2023 1130   BILIRUBINUR NEGATIVE 03/14/2023 1130   KETONESUR 40 (A) 03/14/2023 1130   PROTEINUR >300 (A) 03/14/2023 1130   UROBILINOGEN 0.2 12/01/2010 1051   NITRITE NEGATIVE 03/14/2023 1130   LEUKOCYTESUR NEGATIVE 03/14/2023 1130    Sepsis Labs: Lactic Acid, Venous    Component Value Date/Time   LATICACIDVEN 0.9 06/23/2021 0335    MICROBIOLOGY: Recent Results (from the past 240 hours)  Resp panel by RT-PCR (RSV, Flu A&B, Covid) Anterior Nasal Swab     Status: Abnormal   Collection Time: 03/14/23  8:00 AM   Specimen: Anterior Nasal Swab  Result Value Ref Range Status   SARS Coronavirus 2 by RT PCR NEGATIVE NEGATIVE Final   Influenza A by PCR POSITIVE (A) NEGATIVE Final   Influenza B by PCR NEGATIVE NEGATIVE Final    Comment: (NOTE) The Xpert Xpress SARS-CoV-2/FLU/RSV plus assay is intended as an aid in the diagnosis of influenza from Nasopharyngeal swab specimens and should not be used as a sole basis for treatment. Nasal washings and aspirates are unacceptable for Xpert Xpress SARS-CoV-2/FLU/RSV testing.  Fact Sheet for Patients: BloggerCourse.com  Fact Sheet for Healthcare Providers: SeriousBroker.it  This test is not yet approved or cleared by the [place] FDA and has been authorized for detection and/or diagnosis of SARS-CoV-2 by FDA under an Emergency Use Authorization (EUA). This EUA will remain in effect (meaning this  test can be used) for the duration of the COVID-19 declaration under Section 564(b)(1) of the Act, 21 U.S.C. section 360bbb-3(b)(1), unless the authorization is terminated or revoked.     Resp Syncytial Virus by PCR NEGATIVE NEGATIVE Final    Comment: (NOTE) Fact Sheet for Patients: BloggerCourse.com  Fact  Sheet for Healthcare Providers: SeriousBroker.it  This test is not yet approved or cleared by the [place] and has been authorized for detection and/or diagnosis of SARS-CoV-2 by FDA under an Emergency Use Authorization (EUA). This EUA will remain in effect (meaning this test can be used) for the duration of the COVID-19 declaration under Section 564(b)(1) of the Act, 21 U.S.C. section 360bbb-3(b)(1), unless the authorization is terminated or revoked.  Performed at [facility], [address] [place], [place] [address]   Respiratory (~20 pathogens) panel by PCR     Status: Abnormal   Collection Time: 03/15/23 12:29 PM   Specimen: Nasopharyngeal Swab; Respiratory  Result Value Ref Range Status   Adenovirus NOT DETECTED NOT DETECTED Final   Coronavirus 229E NOT DETECTED NOT DETECTED Final    Comment: (NOTE) The Coronavirus on the Respiratory Panel, DOES NOT test for the novel  Coronavirus (2019 nCoV)    Coronavirus HKU1 NOT DETECTED NOT DETECTED Final   Coronavirus NL63 NOT DETECTED NOT DETECTED Final   Coronavirus OC43 NOT DETECTED NOT DETECTED Final   Metapneumovirus NOT DETECTED NOT DETECTED Final   Rhinovirus / Enterovirus NOT DETECTED NOT DETECTED Final   Influenza A H1 2009 DETECTED (A) NOT DETECTED Final   Influenza B NOT DETECTED NOT DETECTED Final   Parainfluenza Virus 1 NOT DETECTED NOT DETECTED Final   Parainfluenza Virus 2 NOT DETECTED NOT DETECTED Final   Parainfluenza Virus 3 NOT DETECTED NOT DETECTED Final   Parainfluenza Virus 4 NOT DETECTED NOT DETECTED Final   Respiratory Syncytial Virus  NOT DETECTED NOT DETECTED Final   Bordetella pertussis NOT DETECTED NOT DETECTED Final   Bordetella Parapertussis NOT DETECTED NOT DETECTED Final   Chlamydophila pneumoniae NOT DETECTED NOT DETECTED Final   Mycoplasma pneumoniae NOT DETECTED NOT DETECTED Final    Comment: Performed at [facility], [address] [place], [place] [address]  Culture, blood (Routine X 2) w Reflex to ID Panel     Status: None (Preliminary result)   Collection Time: 03/17/23 11:14 AM   Specimen: BLOOD LEFT ARM  Result Value Ref Range Status   Specimen Description BLOOD LEFT ARM  Final   Special Requests   Final    BOTTLES DRAWN AEROBIC AND ANAEROBIC Blood Culture adequate volume   Culture   Final    NO GROWTH 4 DAYS Performed at [facility], [address] [place], [place] [address]    Report Status PENDING  Incomplete  Culture, blood (Routine X 2) w Reflex to ID Panel     Status: None (Preliminary result)   Collection Time: 03/17/23 11:14 AM   Specimen: BLOOD LEFT ARM  Result Value Ref Range Status   Specimen Description BLOOD LEFT ARM  Final   Special Requests   Final    BOTTLES DRAWN AEROBIC AND ANAEROBIC Blood Culture adequate volume   Culture   Final    NO GROWTH 4 DAYS Performed at [facility], [address] [place], [place] [address]    Report Status PENDING  Incomplete    RADIOLOGY STUDIES/RESULTS: DG Chest Port 1 View Result Date: 03/20/2023 CLINICAL DATA:  Interstitial lung disease EXAM: PORTABLE CHEST 1 VIEW COMPARISON:  03/18/2023 FINDINGS: Heart is upper limits normal in size. Mediastinal contours are within normal limits. Patchy bilateral opacities are slightly improved since prior study. No effusions or acute bony abnormality. IMPRESSION: Improving patchy bilateral opacities which could reflect improving edema or infection. Electronically Signed  By: Charlett Nose M.D.   On: 03/20/2023 17:17   US RENAL Result Date: 03/19/2023 CLINICAL DATA:  [id number] AKI (acute  kidney injury) [facility]) [id number] EXAM: RENAL / URINARY TRACT ULTRASOUND COMPLETE COMPARISON:  July 22, 2022, February 27, 2020 FINDINGS: Right Kidney: Renal measurements: 14.1 x 7.8 x 7.6 cm = volume: 438 mL. Echogenicity is mildly increased. No mass or definitive hydronephrosis visualized. Similar mild pelviectasis. Left Kidney: Renal measurements: 13.2 x 7.1 x 5.5 cm = volume: 268 mL. Echogenicity is mildly increased. No mass. Mild pelviectasis of the inferior pole, favored similar compared to prior cross sectional imaging. Bladder: Appears normal for degree of bladder distention. Other: None. IMPRESSION: 1. Mildly increased echogenicity of the bilateral kidneys as can be seen in medical renal disease. 2. Mild bilateral pelviectasis, favored similar compared to prior cross sectional imaging. Electronically Signed   By: Meda Klinefelter M.D.   On: 03/19/2023 16:55     LOS: 7 days   Jeoffrey Massed, MD  [facility]    To contact the attending provider between 7A-7P or the covering provider during after hours 7P-7A, please log into the web site [email or web address] and access using universal [place] password for that web site. If you do not have the password, please call the hospital operator.  03/21/2023, 8:51 AM

## 2023-03-21 NOTE — Progress Notes (Signed)
Nephrology Follow-Up Consult note   Assessment/Recommendations: Mandy Patel is a/an 28 y.o. female with a past medical history significant for DM1, CKD, admitted for influenza with pneumonia and DKA    AKI on CKD 3B: AKI likely tubular injury associated with DKA and infections.  Baseline creatinine around 2.  Creatinine improving down to 3.5 today. -Encourage oral hydration -Continue to monitor daily Cr, Dose meds for GFR -Monitor Daily I/Os, Daily weight  -Maintain MAP>65 for optimal renal perfusion.  -Avoid nephrotoxic medications including NSAIDs -Use synthetic opioids (Fentanyl/Dilaudid) if needed -Follows with Dr. Thedore Mins outpatient  DKA/uncontrolled type 1 diabetes with hyperglycemia: Management per primary team  Influenza A/multifocal pneumonia: Management per primary team  Severe Neri hypertension: Unclear etiology.  Management per primary team  Hypokalemia: Continue with repletion as needed  Metabolic acidosis: Bicarb 18.  Continue to monitor  Anemia: Hemoglobin 7.7.  Transfuse as needed  Recommendations conveyed to primary service.    Darnell Level [facility] 03/21/2023 10:29 AM  ___________________________________________________________  CC: Nausea and vomiting  Interval History/Subjective: Patient states she is feeling better today.  Denies any complaints.  Feels like she is urinating normally.   Medications:  Current Facility-Administered Medications  Medication Dose Route Frequency Provider Last Rate Last Admin   acetaminophen (TYLENOL) tablet 650 mg  650 mg Oral Q6H PRN Clydie Braun, MD   650 mg at 03/16/23 2122   Or   acetaminophen (TYLENOL) suppository 650 mg  650 mg Rectal Q6H PRN Madelyn Flavors A, MD       amLODipine (NORVASC) tablet 5 mg  5 mg Oral Daily Ghimire, Werner Lean, MD   5 mg at 03/21/23 0818   budesonide (PULMICORT) nebulizer solution 0.25 mg  0.25 mg Nebulization BID Marcelle Smiling, MD   0.25 mg at 03/21/23  8295   cefTRIAXone (ROCEPHIN) 2 g in sodium chloride 0.9 % 100 mL IVPB  2 g Intravenous Q24H Maretta Bees, MD 200 mL/hr at 03/21/23 0822 2 g at 03/21/23 0822   dextrose 50 % solution 0-50 mL  0-50 mL Intravenous PRN Elayne Snare K, DO       heparin injection 5,000 Units  5,000 Units Subcutaneous Q8H Smith, Rondell A, MD   5,000 Units at 03/21/23 0511   hydrALAZINE (APRESOLINE) injection 10 mg  10 mg Intravenous Q4H PRN Ghimire, Werner Lean, MD       insulin aspart (novoLOG) injection 0-15 Units  0-15 Units Subcutaneous TID WC Elgergawy, Leana Roe, MD   3 Units at 03/20/23 1634   insulin aspart (novoLOG) injection 0-5 Units  0-5 Units Subcutaneous QHS Elgergawy, Leana Roe, MD       insulin glargine-yfgn (SEMGLEE) injection 16 Units  16 Units Subcutaneous Daily Elgergawy, Leana Roe, MD   16 Units at 03/21/23 0816   levalbuterol (XOPENEX) nebulizer solution 0.63 mg  0.63 mg Nebulization Q6H PRN Maretta Bees, MD       levalbuterol Pauline Aus) nebulizer solution 0.63 mg  0.63 mg Nebulization Q8H Marcelle Smiling, MD   0.63 mg at 03/20/23 2030   metoCLOPramide (REGLAN) injection 10 mg  10 mg Intravenous TID AC Maretta Bees, MD   10 mg at 03/21/23 0817   metoprolol succinate (TOPROL-XL) 24 hr tablet 50 mg  50 mg Oral Daily Maretta Bees, MD   50 mg at 03/21/23 0817   ondansetron (ZOFRAN) injection 4 mg  4 mg Intravenous Q6H PRN Madelyn Flavors A, MD   4 mg at 03/16/23 1449  pantoprazole (PROTONIX) EC tablet 40 mg  40 mg Oral Daily Maretta Bees, MD   40 mg at 03/21/23 0817   potassium chloride SA (KLOR-CON M) CR tablet 40 mEq  40 mEq Oral Q6H Darnell Level, MD   40 mEq at 03/21/23 1610   sodium bicarbonate tablet 650 mg  650 mg Oral BID Maretta Bees, MD   650 mg at 03/21/23 0817   sodium chloride flush (NS) 0.9 % injection 3 mL  3 mL Intravenous Q12H Smith, Rondell A, MD   3 mL at 03/21/23 0818   traMADol (ULTRAM) tablet 50 mg  50 mg Oral Q6H PRN Madelyn Flavors A, MD    50 mg at 03/17/23 2130      Review of Systems: 10 systems reviewed and negative except per interval history/subjective  Physical Exam: Vitals:   03/21/23 0734 03/21/23 0735  BP: 128/84 128/84  Pulse: (!) 105 (!) 105  Resp: (!) 22 (!) 21  Temp:  (!) 97.5 F (36.4 C)  SpO2: 97% 95%   No intake/output data recorded.  Intake/Output Summary (Last 24 hours) at 03/21/2023 1029 Last data filed at 03/20/2023 2132 Gross per 24 hour  Intake 3 ml  Output --  Net 3 ml   Constitutional: well-appearing, no acute distress ENMT: ears and nose without scars or lesions, MMM CV: normal rate, no edema Respiratory: Bilateral chest rise, normal work of breathing Gastrointestinal: soft, non-tender, no palpable masses or hernias Skin: no visible lesions or rashes Psych: alert, judgement/insight appropriate, appropriate mood and affect   Test Results I personally reviewed new and old clinical labs and radiology tests Lab Results  Component Value Date   NA 134 (L) 03/21/2023   K 3.0 (L) 03/21/2023   CL 105 03/21/2023   CO2 18 (L) 03/21/2023   BUN 26 (H) 03/21/2023   CREATININE 3.56 (H) 03/21/2023   GFR 70.42 10/13/2021   CALCIUM 8.0 (L) 03/21/2023   ALBUMIN <1.5 (L) 03/21/2023   PHOS 4.3 03/21/2023    CBC Recent Labs  Lab 03/18/23 0509 03/19/23 0508 03/21/23 0402  WBC 19.0* 16.6* 14.2*  HGB 8.5* 8.3* 7.7*  HCT 25.4* 25.0* 23.2*  MCV 93.0 93.3 92.1  PLT 192 210 340

## 2023-03-22 ENCOUNTER — Other Ambulatory Visit (HOSPITAL_COMMUNITY): Payer: Self-pay

## 2023-03-22 DIAGNOSIS — R112 Nausea with vomiting, unspecified: Secondary | ICD-10-CM

## 2023-03-22 DIAGNOSIS — E1010 Type 1 diabetes mellitus with ketoacidosis without coma: Secondary | ICD-10-CM | POA: Diagnosis not present

## 2023-03-22 DIAGNOSIS — N179 Acute kidney failure, unspecified: Secondary | ICD-10-CM | POA: Diagnosis not present

## 2023-03-22 LAB — BASIC METABOLIC PANEL
Anion gap: 8 (ref 5–15)
BUN: 22 mg/dL — ABNORMAL HIGH (ref 6–20)
CO2: 17 mmol/L — ABNORMAL LOW (ref 22–32)
Calcium: 8 mg/dL — ABNORMAL LOW (ref 8.9–10.3)
Chloride: 110 mmol/L (ref 98–111)
Creatinine, Ser: 3.21 mg/dL — ABNORMAL HIGH (ref 0.44–1.00)
GFR, Estimated: 20 mL/min — ABNORMAL LOW (ref >60)
Glucose, Bld: 82 mg/dL (ref 70–99)
Potassium: 4.1 mmol/L (ref 3.5–5.1)
Sodium: 135 mmol/L (ref 135–145)

## 2023-03-22 LAB — CULTURE, BLOOD (ROUTINE X 2)
Culture: NO GROWTH
Culture: NO GROWTH
Special Requests: ADEQUATE
Special Requests: ADEQUATE

## 2023-03-22 LAB — GLUCOSE, CAPILLARY: Glucose-Capillary: 71 mg/dL (ref 70–99)

## 2023-03-22 MED ORDER — SODIUM BICARBONATE 650 MG PO TABS
1300.0000 mg | ORAL_TABLET | Freq: Two times a day (BID) | ORAL | 1 refills | Status: AC
  Filled 2023-03-22: qty 60, 15d supply, fill #0

## 2023-03-22 MED ORDER — ALBUTEROL SULFATE HFA 108 (90 BASE) MCG/ACT IN AERS
2.0000 | INHALATION_SPRAY | Freq: Four times a day (QID) | RESPIRATORY_TRACT | 0 refills | Status: AC | PRN
  Filled 2023-03-22: qty 6.7, 25d supply, fill #0

## 2023-03-22 MED ORDER — PANTOPRAZOLE SODIUM 40 MG PO TBEC
40.0000 mg | DELAYED_RELEASE_TABLET | Freq: Every day | ORAL | 0 refills | Status: AC
  Filled 2023-03-22: qty 30, 30d supply, fill #0

## 2023-03-22 MED ORDER — FLUTICASONE FUROATE-VILANTEROL 200-25 MCG/ACT IN AEPB
1.0000 | INHALATION_SPRAY | Freq: Every day | RESPIRATORY_TRACT | 1 refills | Status: AC
  Filled 2023-03-22: qty 60, 30d supply, fill #0

## 2023-03-22 MED ORDER — METOCLOPRAMIDE HCL 10 MG PO TABS
10.0000 mg | ORAL_TABLET | Freq: Three times a day (TID) | ORAL | 0 refills | Status: AC | PRN
  Filled 2023-03-22: qty 30, 10d supply, fill #0

## 2023-03-22 MED ORDER — AMLODIPINE BESYLATE 5 MG PO TABS
5.0000 mg | ORAL_TABLET | Freq: Every day | ORAL | 1 refills | Status: AC
  Filled 2023-03-22: qty 30, 30d supply, fill #0

## 2023-03-22 MED ORDER — LANTUS SOLOSTAR 100 UNIT/ML ~~LOC~~ SOPN
16.0000 [IU] | PEN_INJECTOR | Freq: Every day | SUBCUTANEOUS | 1 refills | Status: AC
  Filled 2023-03-22: qty 3, 19d supply, fill #0

## 2023-03-22 MED ORDER — METOPROLOL SUCCINATE ER 50 MG PO TB24
50.0000 mg | ORAL_TABLET | Freq: Every day | ORAL | 1 refills | Status: AC
Start: 2023-03-22 — End: ?
  Filled 2023-03-22: qty 30, 30d supply, fill #0

## 2023-03-22 NOTE — TOC Transition Note (Signed)
Transition of Care [facility]) - Discharge Note   Patient Details  Name: Mandy Patel MRN: [id number] Date of Birth: 03-Sep-1995  [facility] of [facility]) CM/SW Contact:  Gordy Clement, RN Phone Number: 03/22/2023, 9:01 AM   Clinical Narrative:     Patient will DC to home today  MATCH completed for DC meds Family to transport           Patient Goals and CMS Choice            Discharge Placement                       Discharge Plan and Services Additional resources added to the After Visit Summary for                                       Social Drivers of Health (SDOH) Interventions SDOH Screenings   Food Insecurity: No Food Insecurity (03/14/2023)  Housing: Low Risk  (03/14/2023)  Transportation Needs: Unmet Transportation Needs (03/14/2023)  Utilities: Not At Risk (03/14/2023)  Depression (PHQ2-9): Low Risk  (07/01/2022)  Tobacco Use: Low Risk  (03/14/2023)     Readmission Risk Interventions     No data to display

## 2023-03-22 NOTE — Plan of Care (Signed)
 Adequate for discharge.

## 2023-03-22 NOTE — Plan of Care (Signed)
  Problem: Health Behavior/Discharge Planning: Goal: Ability to manage health-related needs will improve Outcome: Progressing   Problem: Clinical Measurements: Goal: Will remain free from infection Outcome: Progressing Goal: Respiratory complications will improve Outcome: Progressing   Problem: Coping: Goal: Level of anxiety will decrease Outcome: Progressing

## 2023-03-22 NOTE — Discharge Summary (Signed)
PATIENT DETAILS Name: Mandy Patel Age: 28 y.o. Sex: female Date of Birth: 1995-07-20 MRN: [id number]. Admitting Physician: Clydie Braun, MD MVH:QIONG, Genene Churn, NP  Admit Date: 03/14/2023 Discharge date: 03/22/2023  Recommendations for Outpatient Follow-up:  Follow up with PCP in 1-2 weeks Please obtain CMP/CBC in one week Please ensure follow up with renal and pulmonology Elevated RA factor-unknown significance-no clinical stigmata of rheumatoid arthritis-will defer further to outpatient physicians-May need rheumatology evaluation Please repeat echocardiogram in 4 to 6 weeks to reassess if patient still has pulmonary hypertension.  Admitted From:  Home  Disposition: Home   Discharge Condition: good  CODE STATUS:   Code Status: Full Code   Diet recommendation:  Diet Order             Diet - low sodium heart healthy           Diet Carb Modified           Diet Carb Modified Fluid consistency: Thin; Room service appropriate? Yes  Diet effective now                    Brief Summary: Patient is a 28 y.o.  female with history of DM-1-presented with nausea/weakness-found to have DKA with AKI in the setting of influenza A infection.   Significant events: 2/10>> admit to [facility]-DKA/AKI/influenza-CXR without pneumonia. 2/12>> worsening SOB-Lasix x 1 dose-CXR with multifocal PNA. 2/13>>Rocephin/zithro started 2/15>>Renal ultrasound:no hydronephroses   Significant studies: 2/10>> CXR: No PNA 2/12>> CXR: Multifocal pneumonia 2/13>> echo: EF 65-70%, RVSP 65.7.   Significant microbiology data: 2/10>> influenza A PCR: Positive 2/10>> influenza B/RSV/COVID PCR: Negative   Procedures: None   Consults: PCCM Advanced heart failure team   Brief Hospital Course: DKA Resolved Treated with IVF/IV insulin Transitioned to SQ insulin   AKI on CKD stage IIIb AKI likely hemodynamically mediated-the setting of DKA/poor appetite/ARB-however after initial  improvement-creatinine started uptrending again-looks like with supportive care-creatinine has now plateaued and is downtrending Continue to avoid nephrotoxic agents Recent autoimmune workup negative Renal ultrasound was negative for hydronephrosis.  Nephrology consulted during this hospital stay-now the patient improving-suspect stable to be discharged home-she has a primary nephrologist which she plans to follow-up in the next 1-2 weeks.  SIRS secondary to Influenza A infection Multifocal pneumonia Developed SOB on 2/12-repeat CXR on 2/13 showed multifocal PNA (initial CXR was negative).  Completed Tamiflu on 2/15-completed Zithromax x 3 days on 2/15- and has completed a course of Rocephin on 2/18. Clinically better-hide on room air or on minimal amount of oxygen.  Leukocytosis slowly improving.  Please with CBC in 1 week.   Nausea This is related to gastroparesis. Significant improvement-no nausea or vomiting-was on scheduled Reglan-she has been counseled regarding small portion meals-will discharge on as needed Reglan.   Hypokalemia Repleted   DM-1 (A1c 10.1 on 2/10) CBG stable Lantus 16 units+ SSI  HTN BP stable Continue metoprolol/amlodipine ARB on hold   Severe pulmonary hypertension Seen on echocardiogram Unclear etiology-body habitus not consistent with OSA physiology, VQ scan negative, Dopplers negative, autoimmune workup so far positive for RA factor-however she has no symptoms of RA.  Rest of autoimmune workup is negative so far. Per cardiology this could be from high flow state in the setting of sepsis-recommendations are to treat underlying infectious issues-perhaps a repeat echo as an outpatient when she has recovered from acute illness.     Normocytic anemia Likely due to IVF dilution/acute illness No evidence of blood loss Follow CBC.  BMI: Estimated body mass index is 29.3 kg/m as calculated from the following:   Height as of this encounter: 5\' 8"  (1.727  m).   Weight as of this encounter: 87.4 kg  Discharge Diagnoses:  Principal Problem:   DKA, type 1 (HCC) Active Problems:   Influenza A   SIRS (systemic inflammatory response syndrome) (HCC)   Acute kidney injury superimposed on chronic kidney disease (HCC)   Normocytic anemia   Pulmonary hypertension, unspecified (HCC)   Discharge Instructions:  Activity:  As tolerated   Discharge Instructions     AMB Referral VBCI Care Management   Complete by: As directed    Type 1 DM- A1C is 10.1%- admitted for flu, DKA, ARF- states she is between jobs/ insurance.  She is interested in insulin pump for better control.   Expected date of contact: Emergent - 3 Days   Service: Pharmacy   Pharmacy Service For:  Disease Management Medication Adherence     Disease states to manage: Diabetes   Call MD for:  difficulty breathing, headache or visual disturbances   Complete by: As directed    Call MD for:  extreme fatigue   Complete by: As directed    Call MD for:  persistant dizziness or light-headedness   Complete by: As directed    Diet - low sodium heart healthy   Complete by: As directed    Diet Carb Modified   Complete by: As directed    Discharge instructions   Complete by: As directed    Follow with Primary MD  Eden Emms, NP in 1-2 weeks  Please get a complete blood count and chemistry panel checked by your Primary MD at your next visit, and again as instructed by your Primary MD.  Get Medicines reviewed and adjusted: Please take all your medications with you for your next visit with your Primary MD  Laboratory/radiological data: Please request your Primary MD to go over all hospital tests and procedure/radiological results at the follow up, please ask your Primary MD to get all Hospital records sent to his/her office.  In some cases, they will be blood work, cultures and biopsy results pending at the time of your discharge. Please request that your primary care M.D. follows  up on these results.  Also Note the following: If you experience worsening of your admission symptoms, develop shortness of breath, life threatening emergency, suicidal or homicidal thoughts you must seek medical attention immediately by calling 911 or calling your MD immediately  if symptoms less severe.  You must read complete instructions/literature along with all the possible adverse reactions/side effects for all the Medicines you take and that have been prescribed to you. Take any new Medicines after you have completely understood and accpet all the possible adverse reactions/side effects.   Do not drive when taking Pain medications or sleeping medications (Benzodaizepines)  Do not take more than prescribed Pain, Sleep and Anxiety Medications. It is not advisable to combine anxiety,sleep and pain medications without talking with your primary care practitioner  Special Instructions: If you have smoked or chewed Tobacco  in the last 2 yrs please stop smoking, stop any regular Alcohol  and or any Recreational drug use.  Wear Seat belts while driving.  Please note: You were cared for by a hospitalist during your hospital stay. Once you are discharged, your primary care physician will handle any further medical issues. Please note that NO REFILLS for any discharge medications will be authorized once you are discharged,  as it is imperative that you return to your primary care physician (or establish a relationship with a primary care physician if you do not have one) for your post hospital discharge needs so that they can reassess your need for medications and monitor your lab values.   Increase activity slowly   Complete by: As directed       Allergies as of 03/22/2023       Reactions   Peanut Oil Other (See Comments)   Reaction to peanuts - gums feel numb and tingling   Other Swelling   Pecans caused throat swelling and weakness        Medication List     STOP taking these  medications    ibuprofen 200 MG tablet Commonly known as: ADVIL   losartan 25 MG tablet Commonly known as: COZAAR       TAKE these medications    albuterol 108 (90 Base) MCG/ACT inhaler Commonly known as: VENTOLIN HFA Inhale 2 puffs into the lungs every 6 (six) hours as needed for wheezing or shortness of breath.   amLODipine 5 MG tablet Commonly known as: NORVASC Take 1 tablet (5 mg total) by mouth daily.   atorvastatin 10 MG tablet Commonly known as: LIPITOR Take 10 mg by mouth daily.   blood glucose meter kit and supplies Kit Dispense based on patient and insurance preference. Use up to four times daily as directed. (FOR ICD-9 250.00, 250.01). Check sugars before meals   Dexcom G7 Sensor Misc Change sensors every 10 days   fluticasone furoate-vilanterol 200-25 MCG/ACT Aepb Commonly known as: Breo Ellipta Inhale 1 puff into the lungs daily.   furosemide 20 MG tablet Commonly known as: LASIX TAKE 1 TABLET(20 MG) BY MOUTH DAILY AS NEEDED What changed: See the new instructions.   insulin lispro 100 UNIT/ML KwikPen Commonly known as: HUMALOG Max daily 40 units What changed:  how much to take when to take this additional instructions   Insulin Pen Needle 31G X 5 MM Misc 1 Device by Does not apply route in the morning, at noon, in the evening, and at bedtime.   Lantus SoloStar 100 UNIT/ML Solostar Pen Generic drug: insulin glargine Inject 16 Units into the skin at bedtime.   metoCLOPramide 10 MG tablet Commonly known as: Reglan Take 1 tablet (10 mg total) by mouth every 8 (eight) hours as needed for nausea. What changed:  medication strength how much to take   metoprolol succinate 50 MG 24 hr tablet Commonly known as: TOPROL-XL Take 1 tablet (50 mg total) by mouth daily. Take with or immediately following a meal. What changed: See the new instructions.   MULTIPLE VITAMINS/WOMENS PO Take 1 tablet by mouth 2 (two) times daily.   pantoprazole 40 MG  tablet Commonly known as: PROTONIX Take 1 tablet (40 mg total) by mouth daily.   sodium bicarbonate 650 MG tablet Take 2 tablets (1,300 mg total) by mouth 2 (two) times daily.   Vitamin D (Ergocalciferol) 50 MCG (2000 UT) Caps Take 50 mcg by mouth in the morning.        Follow-up Information     Eden Emms, NP. Schedule an appointment as soon as possible for a visit in 1 week(s).   Specialties: Nurse Practitioner, Family Medicine Contact information: [address] [place] [address] [phone]         Mosetta Pigeon, MD. Schedule an appointment as soon as possible for a visit in 1 week(s).   Specialty: Nephrology  Contact information: [address] [place] [address] [phone]         Chilton Greathouse, MD Follow up on 05/04/2023.   Specialty: Pulmonary Disease Why: appt at 11:15 am Contact information: [address] [place] [address] [phone]                Allergies  Allergen Reactions   Peanut Oil Other (See Comments)    Reaction to peanuts - gums feel numb and tingling   Other Swelling    Pecans caused throat swelling and weakness     Other Procedures/Studies: DG Chest Port 1 View Result Date: 03/20/2023 CLINICAL DATA:  Interstitial lung disease EXAM: PORTABLE CHEST 1 VIEW COMPARISON:  03/18/2023 FINDINGS: Heart is upper limits normal in size. Mediastinal contours are within normal limits. Patchy bilateral opacities are slightly improved since prior study. No effusions or acute bony abnormality. IMPRESSION: Improving patchy bilateral opacities which could reflect improving edema or infection. Electronically Signed   By: Charlett Nose M.D.   On: 03/20/2023 17:17   US RENAL Result Date: 03/19/2023 CLINICAL DATA:  [id number] AKI (acute kidney injury) [facility]) [id number] EXAM: RENAL / URINARY TRACT ULTRASOUND COMPLETE COMPARISON:  July 22, 2022, February 27, 2020 FINDINGS: Right Kidney: Renal measurements: 14.1 x  7.8 x 7.6 cm = volume: 438 mL. Echogenicity is mildly increased. No mass or definitive hydronephrosis visualized. Similar mild pelviectasis. Left Kidney: Renal measurements: 13.2 x 7.1 x 5.5 cm = volume: 268 mL. Echogenicity is mildly increased. No mass. Mild pelviectasis of the inferior pole, favored similar compared to prior cross sectional imaging. Bladder: Appears normal for degree of bladder distention. Other: None. IMPRESSION: 1. Mildly increased echogenicity of the bilateral kidneys as can be seen in medical renal disease. 2. Mild bilateral pelviectasis, favored similar compared to prior cross sectional imaging. Electronically Signed   By: Meda Klinefelter M.D.   On: 03/19/2023 16:55   NM Pulmonary Perfusion Result Date: 03/18/2023 CLINICAL DATA:  Dyspnea, hypoxia, pulmonary hypertension EXAM: NUCLEAR MEDICINE PERFUSION LUNG SCAN TECHNIQUE: Perfusion images were obtained in multiple projections after intravenous injection of radiopharmaceutical. Ventilation scans intentionally deferred if perfusion scan and chest x-ray adequate for interpretation during COVID 19 epidemic. RADIOPHARMACEUTICALS:  4.0 mCi Tc-89m MAA IV COMPARISON:  Findings are compared to chest radiograph 03/18/2023. No prior scintigraphic examination is available for comparison. FINDINGS: Perfusion only imaging demonstrates numerous in keeping peripheral relative perfusion defects throughout the lungs bilaterally. These perfusion defects in many cases do not follow segmental margins, demonstrate no gradient, and are incomplete. Altogether, the findings are of low probability for pulmonary embolism. IMPRESSION: Low probability for pulmonary embolism. Electronically Signed   By: Helyn Numbers M.D.   On: 03/18/2023 19:23   DG Chest Port 1V same Day Result Date: 03/18/2023 CLINICAL DATA:  Shortness of breath. EXAM: PORTABLE CHEST 1 VIEW COMPARISON:  Chest radiographs 02/27/2020, 06/23/2021, 03/14/2023, 03/16/2023 FINDINGS: Cardiac  silhouette is again mildly enlarged. There is mild bilateral interstitial thickening and there are again diffuse bilateral patchy heterogeneous airspace opacities, not significantly changed from 03/16/2023 but new from 03/14/2023 and 06/23/2021. No pleural effusion pneumothorax. Minimal levocurvature of the upper thoracic spine. IMPRESSION: Diffuse bilateral patchy heterogeneous airspace opacities, not significantly changed from 03/16/2023 but new from 03/14/2023 and 06/23/2021. This may represent moderate interstitial pulmonary edema or multifocal pneumonia. Electronically Signed   By: Neita Garnet M.D.   On: 03/18/2023 13:42   ECHOCARDIOGRAM COMPLETE Result Date: 03/17/2023    ECHOCARDIOGRAM REPORT  Patient Name:   Mandy Patel Date of Exam: 03/17/2023 Medical Rec #:  [id number]       Height:       68.0 in Accession #:    [id number]      Weight:       193.0 lb Date of Birth:  1995/07/03      BSA:          2.013 m Patient Age:    27 years        BP:           143/89 mmHg Patient Gender: F               HR:           108 bpm. Exam Location:  Inpatient Procedure: 2D Echo, Cardiac Doppler and Color Doppler (Both Spectral and Color            Flow Doppler were utilized during procedure).                                MODIFIED REPORT:      This report was modified by Dorthula Nettles on 03/17/2023 due to RVSP                                   correction.  Indications:     R06.02 SOB  History:         Patient has no prior history of Echocardiogram examinations.                  Signs/Symptoms:Shortness of Breath; Risk Factors:Diabetes and                  Dyslipidemia. FLU positive.  Sonographer:     Sheralyn Boatman RDCS Referring Phys:  [id number] [facility] Diagnosing Phys: Dorthula Nettles  Sonographer Comments: Technically difficult study due to poor echo windows. IMPRESSIONS  1. Left ventricular ejection fraction, by estimation, is 65 to 70%. The left ventricle has normal function. The left ventricle has no  regional wall motion abnormalities. Left ventricular diastolic parameters were normal.  2. Right ventricular systolic function is normal. The right ventricular size is mildly enlarged. There is moderately elevated pulmonary artery systolic pressure. The estimated right ventricular systolic pressure is 53.7 mmHg.  3. The mitral valve is normal in structure. No evidence of mitral valve regurgitation.  4. The aortic valve is normal in structure. Aortic valve regurgitation is not visualized. FINDINGS  Left Ventricle: Left ventricular ejection fraction, by estimation, is 65 to 70%. The left ventricle has normal function. The left ventricle has no regional wall motion abnormalities. Strain imaging was not performed. The left ventricular internal cavity  size was normal in size. There is no left ventricular hypertrophy. Left ventricular diastolic parameters were normal. Right Ventricle: The right ventricular size is mildly enlarged. No increase in right ventricular wall thickness. Right ventricular systolic function is normal. There is moderately elevated pulmonary artery systolic pressure. The tricuspid regurgitant velocity is 3.56 m/s, and with an assumed right atrial pressure of 3 mmHg, the estimated right ventricular systolic pressure is 53.7 mmHg. Left Atrium: Left atrial size was normal in size. Right Atrium: Right atrial size was normal in size. Pericardium: There is no evidence of pericardial effusion. Mitral Valve: The mitral valve is normal in structure. No evidence of mitral valve regurgitation. Tricuspid  Valve: The tricuspid valve is grossly normal. Tricuspid valve regurgitation is mild. Aortic Valve: The aortic valve is normal in structure. There is mild aortic valve annular calcification. Aortic valve regurgitation is not visualized. Pulmonic Valve: The pulmonic valve was not well visualized. Pulmonic valve regurgitation is trivial. Aorta: The aortic root and ascending aorta are structurally normal, with no  evidence of dilitation. IAS/Shunts: No atrial level shunt detected by color flow Doppler. Additional Comments: 3D imaging was not performed.  LEFT VENTRICLE PLAX 2D LVIDd:         4.50 cm     Diastology LVIDs:         2.50 cm     LV e' medial:    9.64 cm/s LV PW:         1.00 cm     LV E/e' medial:  8.9 LV IVS:        1.00 cm     LV e' lateral:   19.60 cm/s LVOT diam:     2.20 cm     LV E/e' lateral: 4.4 LV SV:         67 LV SV Index:   33 LVOT Area:     3.80 cm  LV Volumes (MOD) LV vol d, MOD A2C: 76.0 ml LV vol d, MOD A4C: 75.9 ml LV vol s, MOD A2C: 25.9 ml LV vol s, MOD A4C: 21.7 ml LV SV MOD A2C:     50.1 ml LV SV MOD A4C:     75.9 ml LV SV MOD BP:      51.7 ml RIGHT VENTRICLE             IVC RV S prime:     16.90 cm/s  IVC diam: 1.80 cm TAPSE (M-mode): 2.2 cm LEFT ATRIUM             Index        RIGHT ATRIUM           Index LA diam:        3.70 cm 1.84 cm/m   RA Area:     11.30 cm LA Vol (A2C):   19.6 ml 9.74 ml/m   RA Volume:   26.00 ml  12.92 ml/m LA Vol (A4C):   25.1 ml 12.47 ml/m LA Biplane Vol: 23.0 ml 11.43 ml/m  AORTIC VALVE LVOT Vmax:   117.00 cm/s LVOT Vmean:  78.400 cm/s LVOT VTI:    0.176 m  AORTA Ao Root diam: 2.90 cm Ao Asc diam:  2.80 cm MITRAL VALVE               TRICUSPID VALVE MV Area (PHT): 5.54 cm    TR Peak grad:   50.7 mmHg MV Decel Time: 137 msec    TR Vmax:        356.00 cm/s MV E velocity: 85.50 cm/s MV A velocity: 71.00 cm/s  SHUNTS MV E/A ratio:  1.20        Systemic VTI:  0.18 m                            Systemic Diam: 2.20 cm Aditya Sabharwal Electronically signed by Dorthula Nettles Signature Date/Time: 03/17/2023/9:52:51 AM    Final (Updated)    VAS [place] LOWER EXTREMITY VENOUS (DVT) Result Date: 03/17/2023  Lower Venous DVT Study Patient Name:  Anjolina Cordelia Patel  Date of Exam:   03/17/2023 Medical Rec #: [id number]  Accession #:    [id number] Date of Birth: [birth year]       Patient Gender: F Patient Age:   49 years Exam Location:  [facility] Procedure:      VAS  [place] LOWER EXTREMITY VENOUS (DVT) Referring Phys: Jeoffrey Massed --------------------------------------------------------------------------------  Indications: Swelling.  Risk Factors: None identified. Limitations: Patient positioning, patient actively vomiting. Comparison Study: No prior studies. Performing Technologist: Chanda Busing RVT  Examination Guidelines: A complete evaluation includes B-mode imaging, spectral Doppler, color Doppler, and power Doppler as needed of all accessible portions of each vessel. Bilateral testing is considered an integral part of a complete examination. Limited examinations for reoccurring indications may be performed as noted. The reflux portion of the exam is performed with the patient in reverse Trendelenburg.  +---------+---------------+---------+-----------+----------+--------------+ RIGHT    CompressibilityPhasicitySpontaneityPropertiesThrombus Aging +---------+---------------+---------+-----------+----------+--------------+ CFV      Full           Yes      Yes                                 +---------+---------------+---------+-----------+----------+--------------+ SFJ      Full                                                        +---------+---------------+---------+-----------+----------+--------------+ FV Prox  Full                                                        +---------+---------------+---------+-----------+----------+--------------+ FV Mid   Full                                                        +---------+---------------+---------+-----------+----------+--------------+ FV DistalFull                                                        +---------+---------------+---------+-----------+----------+--------------+ PFV      Full                                                        +---------+---------------+---------+-----------+----------+--------------+ POP      Full           Yes      Yes                                  +---------+---------------+---------+-----------+----------+--------------+ PTV      Full                                                        +---------+---------------+---------+-----------+----------+--------------+  PERO     Full                                                        +---------+---------------+---------+-----------+----------+--------------+   +---------+---------------+---------+-----------+----------+--------------+ LEFT     CompressibilityPhasicitySpontaneityPropertiesThrombus Aging +---------+---------------+---------+-----------+----------+--------------+ CFV      Full           Yes      Yes                                 +---------+---------------+---------+-----------+----------+--------------+ SFJ      Full                                                        +---------+---------------+---------+-----------+----------+--------------+ FV Prox  Full                                                        +---------+---------------+---------+-----------+----------+--------------+ FV Mid   Full                                                        +---------+---------------+---------+-----------+----------+--------------+ FV DistalFull                                                        +---------+---------------+---------+-----------+----------+--------------+ PFV      Full                                                        +---------+---------------+---------+-----------+----------+--------------+ POP      Full           Yes      Yes                                 +---------+---------------+---------+-----------+----------+--------------+ PTV      Full                                                        +---------+---------------+---------+-----------+----------+--------------+ PERO     Full                                                         +---------+---------------+---------+-----------+----------+--------------+  Summary: RIGHT: - There is no evidence of deep vein thrombosis in the lower extremity.  - No cystic structure found in the popliteal fossa.  LEFT: - There is no evidence of deep vein thrombosis in the lower extremity.  - No cystic structure found in the popliteal fossa.  *See table(s) above for measurements and observations. Electronically signed by Heath Lark on 03/17/2023 at 4:06:35 PM.    Final    DG Chest Port 1V same Day Result Date: 03/16/2023 CLINICAL DATA:  Shortness of breath. Nausea and weakness. Influenza infection. EXAM: PORTABLE CHEST 1 VIEW COMPARISON:  03/14/2023. FINDINGS: The heart is mildly enlarged and mediastinal contours are within normal limits. Diffuse scattered airspace opacities are noted in the lungs bilaterally. No effusion or pneumothorax is seen. No acute osseous abnormality. IMPRESSION: Diffuse patchy airspace opacities in the lungs bilaterally, concerning for multifocal pneumonia. Electronically Signed   By: Thornell Sartorius M.D.   On: 03/16/2023 19:38   DG Chest Port 1 View Result Date: 03/14/2023 CLINICAL DATA:  Hypoxia. EXAM: PORTABLE CHEST 1 VIEW COMPARISON:  Chest radiograph dated Jun 23, 2021. FINDINGS: The heart size and mediastinal contours are within normal limits. No focal consolidation, sizeable pleural effusion, or pneumothorax. No acute osseous abnormality. Overlapping telemetry wires. IMPRESSION: No acute cardiopulmonary findings. Electronically Signed   By: Hart Robinsons M.D.   On: 03/14/2023 10:30     TODAY-DAY OF DISCHARGE:  Subjective:   Mandy Patel today has no headache,no chest abdominal pain,no new weakness tingling or numbness, feels much better wants to go home today.   Objective:   Blood pressure (!) 150/74, pulse (!) 105, temperature 97.8 F (36.6 C), temperature source Oral, resp. rate 15, height 5\' 8"  (1.727 m), weight 88.7 kg, last menstrual period  03/14/2023, SpO2 94%.  Intake/Output Summary (Last 24 hours) at 03/22/2023 0901 Last data filed at 03/21/2023 2129 Gross per 24 hour  Intake 3 ml  Output --  Net 3 ml   Filed Weights   03/20/23 0447 03/21/23 0500 03/22/23 0322  Weight: 87.5 kg 87.4 kg 88.7 kg    Exam: Awake Alert, Oriented *3, No new F.N deficits, Normal affect [place].AT,PERRAL Supple Neck,No JVD, No cervical lymphadenopathy appriciated.  Symmetrical Chest wall movement, Good air movement bilaterally, CTAB RRR,No Gallops,Rubs or new Murmurs, No Parasternal Heave +ve B.Sounds, Abd Soft, Non tender, No organomegaly appriciated, No rebound -guarding or rigidity. No Cyanosis, Clubbing or edema, No new Rash or bruise   PERTINENT RADIOLOGIC STUDIES: DG Chest Port 1 View Result Date: 03/20/2023 CLINICAL DATA:  Interstitial lung disease EXAM: PORTABLE CHEST 1 VIEW COMPARISON:  03/18/2023 FINDINGS: Heart is upper limits normal in size. Mediastinal contours are within normal limits. Patchy bilateral opacities are slightly improved since prior study. No effusions or acute bony abnormality. IMPRESSION: Improving patchy bilateral opacities which could reflect improving edema or infection. Electronically Signed   By: Charlett Nose M.D.   On: 03/20/2023 17:17     PERTINENT LAB RESULTS: CBC: Recent Labs    03/21/23 0402  WBC 14.2*  HGB 7.7*  HCT 23.2*  PLT 340   CMET CMP     Component Value Date/Time   NA 135 03/22/2023 0453   NA 136 08/24/2022 0748   K 4.1 03/22/2023 0453   CL 110 03/22/2023 0453   CO2 17 (L) 03/22/2023 0453   GLUCOSE 82 03/22/2023 0453   BUN 22 (H) 03/22/2023 0453   BUN 33 (H) 08/24/2022 0748   CREATININE 3.21 (H) 03/22/2023 0453  CALCIUM 8.0 (L) 03/22/2023 0453   PROT 6.9 03/14/2023 0759   PROT 6.3 07/01/2022 1625   ALBUMIN <1.5 (L) 03/21/2023 0402   ALBUMIN 3.5 (L) 07/01/2022 1625   AST 11 (L) 03/14/2023 0759   ALT 13 03/14/2023 0759   ALKPHOS 94 03/14/2023 0759   BILITOT 1.2 03/14/2023  0759   BILITOT <0.2 07/01/2022 1625   GFR 70.42 10/13/2021 1304   EGFR 49 (L) 08/24/2022 0748   GFRNONAA 20 (L) 03/22/2023 0453    GFR Estimated Creatinine Clearance: 30.7 mL/min (A) (by C-G formula based on SCr of 3.21 mg/dL (H)). No results for input(s): "LIPASE", "AMYLASE" in the last 72 hours. No results for input(s): "CKTOTAL", "CKMB", "CKMBINDEX", "TROPONINI" in the last 72 hours. Invalid input(s): "POCBNP" No results for input(s): "DDIMER" in the last 72 hours. No results for input(s): "HGBA1C" in the last 72 hours. No results for input(s): "CHOL", "HDL", "LDLCALC", "TRIG", "CHOLHDL", "LDLDIRECT" in the last 72 hours. No results for input(s): "TSH", "T4TOTAL", "T3FREE", "THYROIDAB" in the last 72 hours.  Invalid input(s): "FREET3" No results for input(s): "VITAMINB12", "FOLATE", "FERRITIN", "TIBC", "IRON", "RETICCTPCT" in the last 72 hours. Coags: No results for input(s): "INR" in the last 72 hours.  Invalid input(s): "PT" Microbiology: Recent Results (from the past 240 hours)  Resp panel by RT-PCR (RSV, Flu A&B, Covid) Anterior Nasal Swab     Status: Abnormal   Collection Time: 03/14/23  8:00 AM   Specimen: Anterior Nasal Swab  Result Value Ref Range Status   SARS Coronavirus 2 by RT PCR NEGATIVE NEGATIVE Final   Influenza A by PCR POSITIVE (A) NEGATIVE Final   Influenza B by PCR NEGATIVE NEGATIVE Final    Comment: (NOTE) The Xpert Xpress SARS-CoV-2/FLU/RSV plus assay is intended as an aid in the diagnosis of influenza from Nasopharyngeal swab specimens and should not be used as a sole basis for treatment. Nasal washings and aspirates are unacceptable for Xpert Xpress SARS-CoV-2/FLU/RSV testing.  Fact Sheet for Patients: BloggerCourse.com  Fact Sheet for Healthcare Providers: SeriousBroker.it  This test is not yet approved or cleared by the [place] FDA and has been authorized for detection and/or diagnosis of  SARS-CoV-2 by FDA under an Emergency Use Authorization (EUA). This EUA will remain in effect (meaning this test can be used) for the duration of the COVID-19 declaration under Section 564(b)(1) of the Act, 21 U.S.C. section 360bbb-3(b)(1), unless the authorization is terminated or revoked.     Resp Syncytial Virus by PCR NEGATIVE NEGATIVE Final    Comment: (NOTE) Fact Sheet for Patients: BloggerCourse.com  Fact Sheet for Healthcare Providers: SeriousBroker.it  This test is not yet approved or cleared by the [place] FDA and has been authorized for detection and/or diagnosis of SARS-CoV-2 by FDA under an Emergency Use Authorization (EUA). This EUA will remain in effect (meaning this test can be used) for the duration of the COVID-19 declaration under Section 564(b)(1) of the Act, 21 U.S.C. section 360bbb-3(b)(1), unless the authorization is terminated or revoked.  Performed at [facility], [address] [place], [place] [address]   Respiratory (~20 pathogens) panel by PCR     Status: Abnormal   Collection Time: 03/15/23 12:29 PM   Specimen: Nasopharyngeal Swab; Respiratory  Result Value Ref Range Status   Adenovirus NOT DETECTED NOT DETECTED Final   Coronavirus 229E NOT DETECTED NOT DETECTED Final    Comment: (NOTE) The Coronavirus on the Respiratory Panel, DOES NOT test for the novel  Coronavirus (2019 nCoV)  Coronavirus HKU1 NOT DETECTED NOT DETECTED Final   Coronavirus NL63 NOT DETECTED NOT DETECTED Final   Coronavirus OC43 NOT DETECTED NOT DETECTED Final   Metapneumovirus NOT DETECTED NOT DETECTED Final   Rhinovirus / Enterovirus NOT DETECTED NOT DETECTED Final   Influenza A H1 2009 DETECTED (A) NOT DETECTED Final   Influenza B NOT DETECTED NOT DETECTED Final   Parainfluenza Virus 1 NOT DETECTED NOT DETECTED Final   Parainfluenza Virus 2 NOT DETECTED NOT DETECTED Final   Parainfluenza Virus 3 NOT  DETECTED NOT DETECTED Final   Parainfluenza Virus 4 NOT DETECTED NOT DETECTED Final   Respiratory Syncytial Virus NOT DETECTED NOT DETECTED Final   Bordetella pertussis NOT DETECTED NOT DETECTED Final   Bordetella Parapertussis NOT DETECTED NOT DETECTED Final   Chlamydophila pneumoniae NOT DETECTED NOT DETECTED Final   Mycoplasma pneumoniae NOT DETECTED NOT DETECTED Final    Comment: Performed at [facility], [address] [place], [place] [address]  Culture, blood (Routine X 2) w Reflex to ID Panel     Status: None   Collection Time: 03/17/23 11:14 AM   Specimen: BLOOD LEFT ARM  Result Value Ref Range Status   Specimen Description BLOOD LEFT ARM  Final   Special Requests   Final    BOTTLES DRAWN AEROBIC AND ANAEROBIC Blood Culture adequate volume   Culture   Final    NO GROWTH 5 DAYS Performed at [facility], [address] [place], [place] [address]    Report Status 03/22/2023 FINAL  Final  Culture, blood (Routine X 2) w Reflex to ID Panel     Status: None   Collection Time: 03/17/23 11:14 AM   Specimen: BLOOD LEFT ARM  Result Value Ref Range Status   Specimen Description BLOOD LEFT ARM  Final   Special Requests   Final    BOTTLES DRAWN AEROBIC AND ANAEROBIC Blood Culture adequate volume   Culture   Final    NO GROWTH 5 DAYS Performed at [facility], [address] [place], [place] [address]    Report Status 03/22/2023 FINAL  Final    FURTHER DISCHARGE INSTRUCTIONS:  Get Medicines reviewed and adjusted: Please take all your medications with you for your next visit with your Primary MD  Laboratory/radiological data: Please request your Primary MD to go over all hospital tests and procedure/radiological results at the follow up, please ask your Primary MD to get all Hospital records sent to his/her office.  In some cases, they will be blood work, cultures and biopsy results pending at the time of your discharge. Please request that your primary care  M.D. goes through all the records of your hospital data and follows up on these results.  Also Note the following: If you experience worsening of your admission symptoms, develop shortness of breath, life threatening emergency, suicidal or homicidal thoughts you must seek medical attention immediately by calling 911 or calling your MD immediately  if symptoms less severe.  You must read complete instructions/literature along with all the possible adverse reactions/side effects for all the Medicines you take and that have been prescribed to you. Take any new Medicines after you have completely understood and accpet all the possible adverse reactions/side effects.   Do not drive when taking Pain medications or sleeping medications (Benzodaizepines)  Do not take more than prescribed Pain, Sleep and Anxiety Medications. It is not advisable to combine anxiety,sleep and pain medications without talking with your primary care practitioner  Special Instructions: If you have smoked or chewed Tobacco  in the last 2 yrs please stop smoking, stop any regular Alcohol  and or any Recreational drug use.  Wear Seat belts while driving.  Please note: You were cared for by a hospitalist during your hospital stay. Once you are discharged, your primary care physician will handle any further medical issues. Please note that NO REFILLS for any discharge medications will be authorized once you are discharged, as it is imperative that you return to your primary care physician (or establish a relationship with a primary care physician if you do not have one) for your post hospital discharge needs so that they can reassess your need for medications and monitor your lab values.  Total Time spent coordinating discharge including counseling, education and face to face time equals greater than 30 minutes.  SignedJeoffrey Massed 03/22/2023 9:01 AM

## 2023-03-22 NOTE — Progress Notes (Signed)
AVS provided, educational information discussed and reviewed with patient, denies questions or concerns.  PIV d/c'd with out complications.  Discharge home via wheelchair.

## 2023-03-23 ENCOUNTER — Telehealth: Payer: Self-pay

## 2023-03-23 LAB — ANTINUCLEAR ANTIBODIES, IFA: ANA Ab, IFA: NEGATIVE

## 2023-03-23 NOTE — Transitions of Care (Post Inpatient/ED Visit) (Signed)
   03/23/2023  Name: MAKINZEY BANES MRN: [id number] DOB: 1995/04/01  Today's TOC FU Call Status: Today's TOC FU Call Status:: Unsuccessful Call (1st Attempt) Unsuccessful Call (1st Attempt) Date: 03/23/23  Attempted to reach the patient regarding the most recent Inpatient/ED visit.  Follow Up Plan: Additional outreach attempts will be made to reach the patient to complete the Transitions of Care (Post Inpatient/ED visit) call.   Deidre Ala, BSN, RN [place]  [facility] - [employer] [facility] RN Care Manager [phone]

## 2023-03-24 ENCOUNTER — Telehealth: Payer: Self-pay

## 2023-03-24 NOTE — Transitions of Care (Post Inpatient/ED Visit) (Signed)
03/24/2023  Name: Mandy Patel MRN: [id number] DOB: 08-30-1995  Today's TOC FU Call Status: Today's TOC FU Call Status:: Successful TOC FU Call Completed TOC FU Call Complete Date: 03/24/23 Patient's Name and Date of Birth confirmed.  Transition Care Management Follow-up Telephone Call Date of Discharge: 03/22/23 Discharge Facility: [facility]) Type of Discharge: Inpatient Admission Primary Inpatient Discharge Diagnosis:: Flu How have you been since you were released from the hospital?: Better Any questions or concerns?: No  Items Reviewed: Did you receive and understand the discharge instructions provided?: Yes Medications obtained,verified, and reconciled?: Yes (Medications Reviewed) Any new allergies since your discharge?: No Dietary orders reviewed?: NA Do you have support at home?: Yes People in Home: parent(s) Name of Support/Comfort Primary Source: Darryll Capers, mother  Medications Reviewed Today: Medications Reviewed Today     Reviewed by Redge Gainer, RN (Case Manager) on 03/24/23 at 1507  Med List Status: <None>   Medication Order Taking? Sig Documenting Provider Last Dose Status Informant  albuterol (VENTOLIN HFA) 108 (90 Base) MCG/ACT inhaler [id number]  Inhale 2 puffs into the lungs every 6 (six) hours as needed for wheezing or shortness of breath. Maretta Bees, MD  Active   amLODipine (NORVASC) 5 MG tablet [id number]  Take 1 tablet (5 mg total) by mouth daily. Maretta Bees, MD  Active   atorvastatin (LIPITOR) 10 MG tablet [id number] No Take 10 mg by mouth daily. [provider] 03/14/2023 Morning Active Pharmacy Records, Self  blood glucose meter kit and supplies KIT [id number] No Dispense based on patient and insurance preference. Use up to four times daily as directed. (FOR ICD-9 250.00, 250.01). Check sugars before meals Alford Highland, MD Taking Active Pharmacy Records, Self  Continuous Blood Gluc Sensor (DEXCOM G7 SENSOR) [place]  [id number] No Change sensors every 10 days Shamleffer, Konrad Dolores, MD Taking Active Pharmacy Records, Self  fluticasone furoate-vilanterol (BREO ELLIPTA) 200-25 MCG/ACT AEPB [id number]  Inhale 1 puff into the lungs daily. Maretta Bees, MD  Active   furosemide (LASIX) 20 MG tablet [id number]  TAKE 1 TABLET(20 MG) BY MOUTH DAILY AS NEEDED  Patient taking differently: Take 20 mg by mouth daily as needed for fluid (ankle).   Eden Emms, NP  Active Pharmacy Records, Self  insulin glargine (LANTUS SOLOSTAR) 100 UNIT/ML Solostar Pen [id number]  Inject 16 Units into the skin at bedtime. Maretta Bees, MD  Active   insulin lispro (HUMALOG) 100 UNIT/ML KwikPen [id number] No Max daily 40 units  Patient taking differently: 4-40 Units See admin instructions. Patient is taking 4-40 Units 3 (three) times daily. Max daily 40 units depending on her meals.   Shamleffer, Konrad Dolores, MD 03/10/2023 Active Pharmacy Records, Self  Insulin Pen Needle 31G X 5 MM MISC [id number] No 1 Device by Does not apply route in the morning, at noon, in the evening, and at bedtime. Shamleffer, Konrad Dolores, MD Taking Active Pharmacy Records, Self  metoCLOPramide (REGLAN) 10 MG tablet [id number]  Take 1 tablet (10 mg total) by mouth every 8 (eight) hours as needed for nausea. Maretta Bees, MD  Active   metoprolol succinate (TOPROL-XL) 50 MG 24 hr tablet [id number]  Take 1 tablet (50 mg total) by mouth daily. Take with or immediately following a meal. Ghimire, Werner Lean, MD  Active   Multiple Vitamins-Minerals (MULTIPLE VITAMINS/WOMENS PO) [id number] No Take 1 tablet by mouth 2 (two) times daily. [provider] 03/10/2023 Active Pharmacy Records, Self  pantoprazole (PROTONIX) 40 MG tablet [id number]  Take 1 tablet (40 mg total) by mouth daily. Maretta Bees, MD  Active   sodium bicarbonate 650 MG tablet [id number]  Take 2 tablets (1,300 mg total) by mouth 2 (two) times daily. Maretta Bees, MD  Active    Vitamin D, Ergocalciferol, 50 MCG (2000 UT) CAPS [id number] No Take 50 mcg by mouth in the morning. [provider] 03/10/2023 Active Pharmacy Records, Self            Home Care and Equipment/Supplies: Were Home Health Services Ordered?: NA Any new equipment or medical supplies ordered?: NA  Functional Questionnaire: Do you need assistance with bathing/showering or dressing?: No Do you need assistance with meal preparation?: No Do you need assistance with eating?: No Do you have difficulty maintaining continence: No Do you need assistance with getting out of bed/getting out of a chair/moving?: No Do you have difficulty managing or taking your medications?: No  Follow up appointments reviewed: PCP Follow-up appointment confirmed?: Yes Date of PCP follow-up appointment?: 03/31/23 Follow-up Provider: Vernona Rieger Specialist [facility] Follow-up appointment confirmed?: NA Do you need transportation to your follow-up appointment?: No Do you understand care options if your condition(s) worsen?: Yes-patient verbalized understanding  SDOH Interventions Today    Flowsheet Row Most Recent Value  SDOH Interventions   Food Insecurity Interventions Intervention Not Indicated  Housing Interventions Intervention Not Indicated  Transportation Interventions Intervention Not Indicated  Utilities Interventions Intervention Not Indicated      Interventions Today    Flowsheet Row Most Recent Value  Chronic Disease   Chronic disease during today's visit Diabetes  General Interventions   General Interventions Discussed/Reviewed General Interventions Discussed, General Interventions Reviewed, Psychiatrist Interventions   Education Provided Provided Education  Provided Verbal Education On [employer], Medication, Applications  [Medicaid]  Pharmacy Interventions   Pharmacy Dicussed/Reviewed Medications and their functions       Encouraged the patient to  apply for Medicaid for additional community resources. She works full-time and lives with her Mom. She got the Flu which spiraled into DKA. She states she is much better and back to work   [employer], Scientist, research (physical sciences), Social worker Care Manager [phone]

## 2023-03-28 ENCOUNTER — Encounter: Payer: Self-pay | Admitting: Pharmacist

## 2023-03-28 NOTE — Progress Notes (Signed)
 Attempted to contact patient for referral regarding diabetes education s/p recent hospitalization. Left HIPAA compliant message for patient to return my call at their convenience. Provided direct callback number.

## 2023-03-29 NOTE — Telephone Encounter (Signed)
 Called patient x2 no call back. Sent my chart to follow up.

## 2023-03-31 ENCOUNTER — Inpatient Hospital Stay: Payer: Managed Care, Other (non HMO) | Admitting: Primary Care

## 2023-04-01 ENCOUNTER — Encounter: Payer: Self-pay | Admitting: Nurse Practitioner

## 2023-04-13 ENCOUNTER — Encounter: Payer: Self-pay | Admitting: Pharmacist

## 2023-04-13 NOTE — Progress Notes (Signed)
 Attempted to contact patient for referral regarding diabetes education s/p recent hospitalization. Left HIPAA compliant message for patient to return my call at their convenience. Provided direct callback number.

## 2023-05-04 ENCOUNTER — Encounter: Payer: Self-pay | Admitting: Pulmonary Disease

## 2023-05-04 ENCOUNTER — Inpatient Hospital Stay: Payer: Managed Care, Other (non HMO) | Admitting: Pulmonary Disease

## 2023-07-04 ENCOUNTER — Encounter: Payer: Managed Care, Other (non HMO) | Admitting: Nurse Practitioner

## 2023-07-04 NOTE — Progress Notes (Deleted)
   Established Patient Office Visit  Subjective   Patient ID: Mandy Patel, female    DOB: 09-24-1995  Age: 28 y.o. MRN: [id number]  No chief complaint on file.   HPI  for complete physical and follow up of chronic conditions.  DM1: Patient currently followed by endocrinology  HTN: Patient currently maintained on metoprolol  50 mg daily, amlodipine  5 mg daily.  Asthma: Patient currently maintained on albuterol  inhaler as needed and Breo inhaler daily  Edema: Patient currently maintained on Lasix  20 mg as needed  Immunizations: -Tetanus: Completed in 2024 -Influenza: Out of season -Shingles: Too young -Pneumonia: Completed   Diet: Fair diet.  Exercise: No regular exercise.  Eye exam: Completes annually  Dental exam: Completes semi-annually    Colonoscopy: Completed in  Lung Cancer Screening: Completed in   Pap Smear: 10/26/2021 with ASCUS cells.  Overdue repeat in a year with Alicia Copland, GYN  Mammogram: Too young, currently average risk  DEXA:  Sleep:     {History (Optional):23778}  ROS    Objective:     There were no vitals taken for this visit. {Vitals History (Optional):23777}  Physical Exam   No results found for any visits on 07/04/23.  {Labs (Optional):23779}  The ASCVD Risk score (Arnett DK, et al., 2019) failed to calculate for the following reasons:   The 2019 ASCVD risk score is only valid for ages 53 to 84    Assessment & Plan:   Problem List Items Addressed This Visit   None   No follow-ups on file.    Margarie Shay, NP

## 2023-08-29 ENCOUNTER — Other Ambulatory Visit: Payer: Self-pay | Admitting: Nurse Practitioner

## 2023-08-29 DIAGNOSIS — E1065 Type 1 diabetes mellitus with hyperglycemia: Secondary | ICD-10-CM

## 2023-08-29 NOTE — Telephone Encounter (Unsigned)
 Copied from CRM [id number]. Topic: Clinical - Medication Refill >> Aug 29, 2023  2:45 PM Franky GRADE wrote: Medication: Rx #: [id number]  insulin  glargine (LANTUS  SOLOSTAR) 100 UNIT/ML Solostar Pen [[id number] , insulin  lispro (HUMALOG ) 100 UNIT/ML KwikPen [[id number]],$MzfnczAzqnmzIZPI_mPOeDsnhGHDRwRTdOjLfcbTwQavvirTc$$MzfnczAzqnmzIZPI_mPOeDsnhGHDRwRTdOjLfcbTwQavvirTc$  Pen Needle 31G X 5 MM MISC [[id number]]  Has the patient contacted their pharmacy? No (Agent: If no, request that the patient contact the pharmacy for the refill. If patient does not wish to contact the pharmacy document the reason why and proceed with request.) (Agent: If yes, when and what did the pharmacy advise?)  This is the patient's preferred pharmacy:    [id number] [place], [place] - [address] AT [facility] OF [address] [place] [address] Phone: [phone] Fax: [phone]   Is this the correct pharmacy for this prescription? Yes If no, delete pharmacy and type the correct one.   Has the prescription been filled recently? No  Is the patient out of the medication? No  Has the patient been seen for an appointment in the last year OR does the patient have an upcoming appointment? Yes  Can we respond through MyChart? Yes  Agent: Please be advised that Rx refills may take up to 3 business days. We ask that you follow-up with your pharmacy.

## 2023-10-13 ENCOUNTER — Other Ambulatory Visit: Payer: Self-pay | Admitting: Nurse Practitioner

## 2023-10-13 ENCOUNTER — Ambulatory Visit: Payer: Self-pay | Admitting: Nurse Practitioner

## 2023-10-13 DIAGNOSIS — R112 Nausea with vomiting, unspecified: Secondary | ICD-10-CM

## 2023-10-13 DIAGNOSIS — E1065 Type 1 diabetes mellitus with hyperglycemia: Secondary | ICD-10-CM

## 2023-10-13 NOTE — Telephone Encounter (Signed)
 Copied from CRM [id number]. Topic: Clinical - Medication Refill >> Oct 13, 2023  9:29 AM Tiffini S wrote: Medication: metoCLOPramide  (REGLAN ) 10 MG tablet, insulin  lispro (HUMALOG ) 100 UNIT/ML KwikPen, insulin  glargine (LANTUS  SOLOSTAR) 100 UNIT/ML Solostar Pen  Has the patient contacted their pharmacy? No (Agent: If no, request that the patient contact the pharmacy for the refill. If patient does not wish to contact the pharmacy document the reason why and proceed with request.) (Agent: If yes, when and what did the pharmacy advise?)  This is the patient's preferred pharmacy:   [facility] [phone],  - [address] [place] [address] Phone: [phone] Fax: [phone]    Is this the correct pharmacy for this prescription? Yes If no, delete pharmacy and type the correct one.   Has the prescription been filled recently? Yes  Is the patient out of the medication? Yes  Has the patient been seen for an appointment in the last year OR does the patient have an upcoming appointment? Yes  Can we respond through MyChart? Yes  Agent: Please be advised that Rx refills may take up to 3 business days. We ask that you follow-up with your pharmacy.

## 2023-10-13 NOTE — Progress Notes (Deleted)
   Established Patient Office Visit  Subjective   Patient ID: Mandy Patel, female    DOB: [birth year]  Age: 28 y.o. MRN: [id number]  No chief complaint on file.   HPI  DM: Currently maintained on Humalog  4 to 40 units 3 times daily, Lantus  16 units at bedtime.  Patient is followed by endocrinology and maintained on continuous glucose monitor  HTN: Currently maintained on amlodipine  5 mg daily, metoprolol  50 mg daily  HLD: Currently maintained on atorvastatin 10 mg daily for risk reduction  GERD: Currently maintained on Protonix  40 mg daily  CKD: Family history of the same patient is currently followed by nephrology.  for complete physical and follow up of chronic conditions.  Immunizations: -Tetanus: Completed in 2024 -Influenza:  -Shingles: Too young -Pneumonia: Completed 2019 -HPV:  Diet: Fair diet.  Exercise: No regular exercise.  Eye exam: Completes annually  Dental exam: Completes semi-annually    Colonoscopy: Completed in  Lung Cancer Screening: Completed in   Pap smear: Due, 10/26/2021 with ASCUS.  Was followed by Bernarda Copland  Mammogram:  DEXA:  Sleep:    {History (Optional):23778}  ROS    Objective:     There were no vitals taken for this visit. {Vitals History (Optional):23777}  Physical Exam   No results found for any visits on 10/13/23.  {Labs (Optional):23779}  The ASCVD Risk score (Arnett DK, et al., 2019) failed to calculate for the following reasons:   The 2019 ASCVD risk score is only valid for ages 75 to 4    Assessment & Plan:   Problem List Items Addressed This Visit   None   No follow-ups on file.    Adina Crandall, NP

## 2023-11-14 ENCOUNTER — Ambulatory Visit: Admitting: Nurse Practitioner

## 2023-11-14 NOTE — Progress Notes (Deleted)
   Established Patient Office Visit  Subjective   Patient ID: Mandy Patel, female    DOB: 1995/05/11  Age: 28 y.o. MRN: [id number]  No chief complaint on file.   HPI  HTN: Patient currently maintained on amlodipine  5 mg daily, Lasix  20 mg daily as needed, metoprolol  50 mg daily  DM: Currently maintained on Lantus  and Humalog  with a CGM.  Asthma:  GERD: Patient currently maintained on Protonix  40 mg daily  CKD: History of the same.Patient was being followed by [facility] Dr. Saralee Stank  for complete physical and follow up of chronic conditions.  Immunizations: -Tetanus: Completed in 2024 -Influenza:  -Shingles: Too young -Pneumonia: Completed needs Prevnar 20  Diet: Fair diet.  Exercise: No regular exercise.  Eye exam: Completes annually  Dental exam: Completes semi-annually    Colonoscopy: Too young Lung Cancer Screening: N/A  Pap smear: Due. 10/26/2021 with ASCUS cells.  Patient overdue was due in 2024  Mammogram: Too young, currently average risk  DEXA: Too young  Sleep:    {History (Optional):23778}  ROS    Objective:     There were no vitals taken for this visit. {Vitals History (Optional):23777}  Physical Exam   No results found for any visits on 11/14/23.  {Labs (Optional):23779}  The ASCVD Risk score (Arnett DK, et al., 2019) failed to calculate for the following reasons:   The 2019 ASCVD risk score is only valid for ages 7 to 84    Assessment & Plan:   Problem List Items Addressed This Visit   None   No follow-ups on file.    Adina Crandall, NP

## 2024-02-09 IMAGING — DX DG CHEST 1V PORT
1 series · 1 of 1 positions shown · non-contrast
Comparison: 02/27/2020

CLINICAL DATA: Sepsis.

EXAM:
PORTABLE CHEST 1 VIEW

[chest ap]
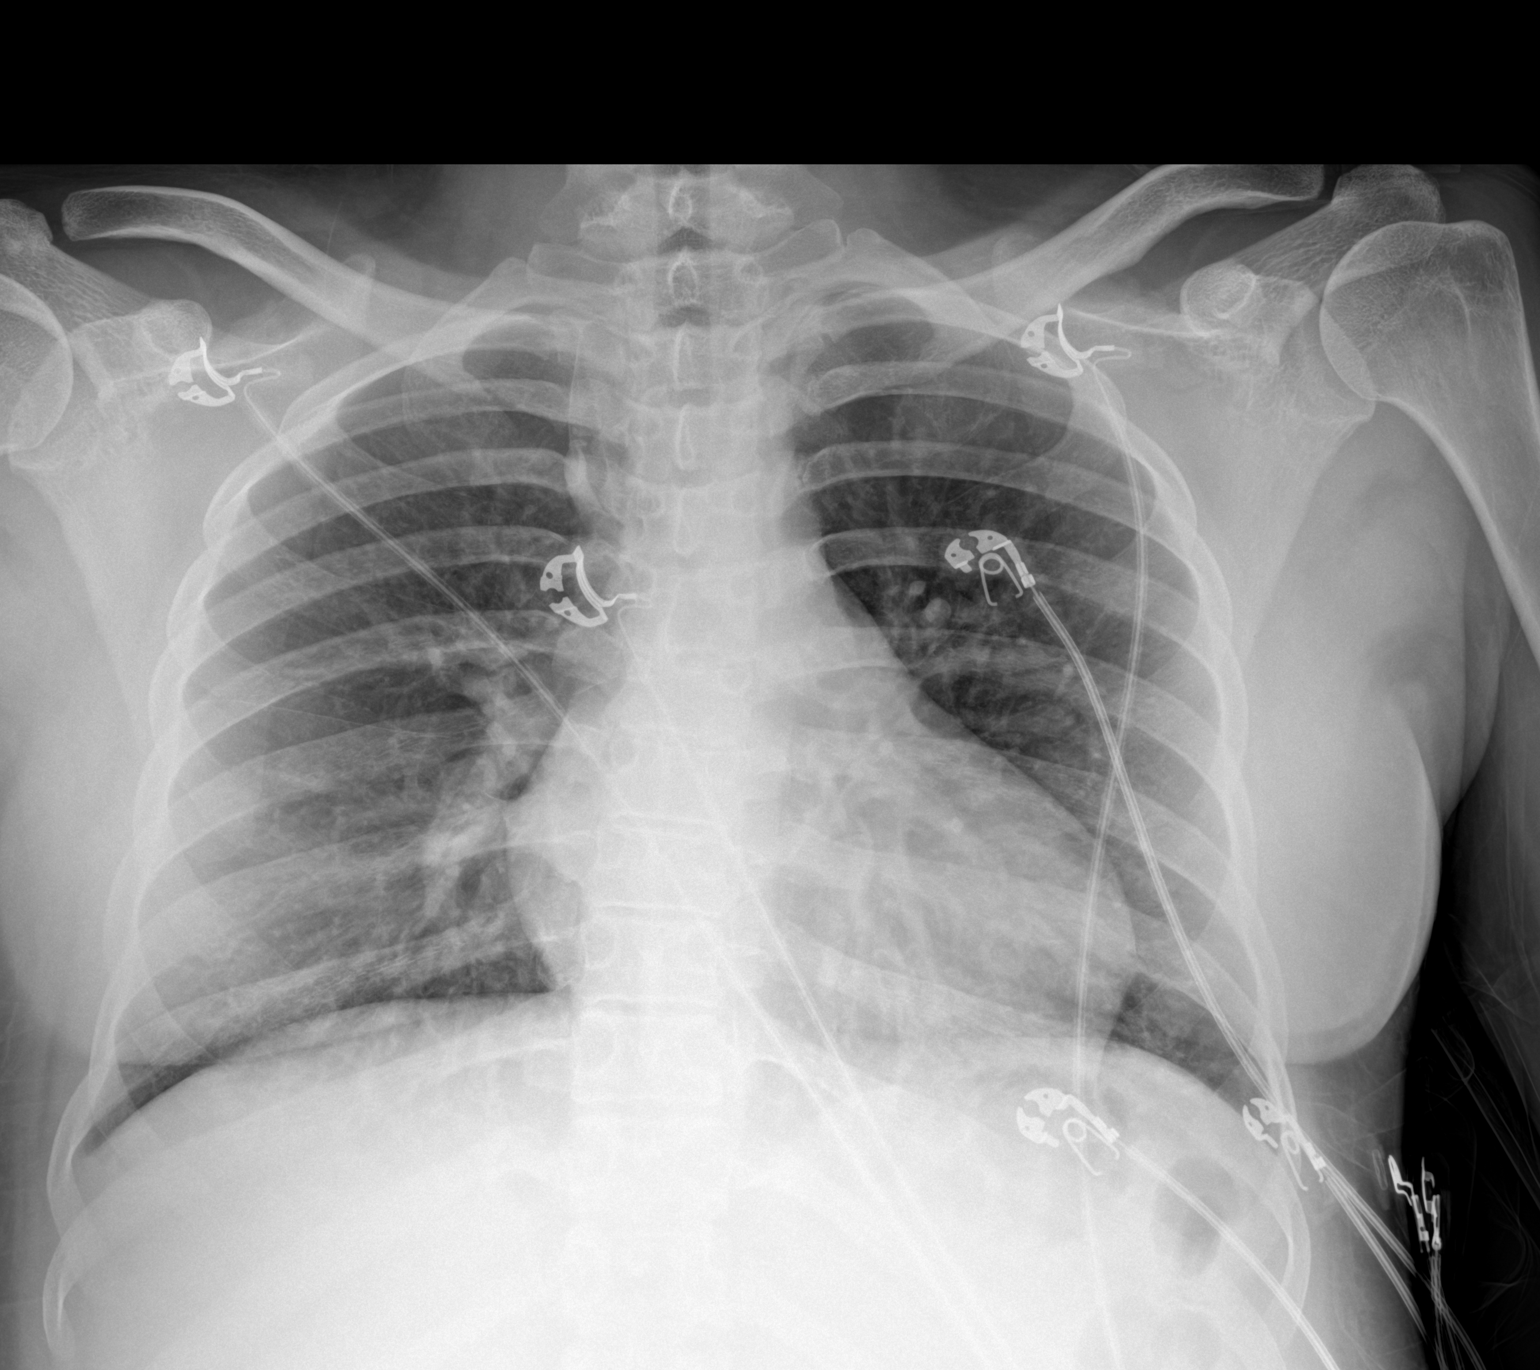

[1 of 1 positions shown; findings below may reference images not displayed]

FINDINGS: 2361 hours. The lungs are clear without focal pneumonia, edema,
pneumothorax or pleural effusion. The cardiopericardial silhouette
is within normal limits for size. The visualized bony structures of
the thorax are unremarkable. Telemetry leads overlie the chest.
IMPRESSION: No active disease.

## 2024-02-21 ENCOUNTER — Emergency Department: Admission: EM | Admit: 2024-02-21 | Discharge: 2024-02-21 | Disposition: A | Discharge status: Home / Self Care

## 2024-02-21 ENCOUNTER — Other Ambulatory Visit: Payer: Self-pay

## 2024-02-21 DIAGNOSIS — H538 Other visual disturbances: Secondary | ICD-10-CM | POA: Diagnosis present

## 2024-02-21 DIAGNOSIS — E109 Type 1 diabetes mellitus without complications: Secondary | ICD-10-CM | POA: Diagnosis not present

## 2024-02-21 DIAGNOSIS — I10 Essential (primary) hypertension: Secondary | ICD-10-CM | POA: Diagnosis not present

## 2024-02-21 LAB — CBC
HCT: 27.3 % — ABNORMAL LOW (ref 36.0–46.0)
Hemoglobin: 8.8 g/dL — ABNORMAL LOW (ref 12.0–15.0)
MCH: 31.9 pg (ref 26.0–34.0)
MCHC: 32.2 g/dL (ref 30.0–36.0)
MCV: 98.9 fL (ref 80.0–100.0)
Platelets: 287 K/uL (ref 150–400)
RBC: 2.76 MIL/uL — ABNORMAL LOW (ref 3.87–5.11)
RDW: 12.3 % (ref 11.5–15.5)
WBC: 14.9 K/uL — ABNORMAL HIGH (ref 4.0–10.5)
nRBC: 0 % (ref 0.0–0.2)

## 2024-02-21 LAB — BASIC METABOLIC PANEL WITH GFR
Anion gap: 10 (ref 5–15)
BUN: 31 mg/dL — ABNORMAL HIGH (ref 6–20)
CO2: 19 mmol/L — ABNORMAL LOW (ref 22–32)
Calcium: 8.8 mg/dL — ABNORMAL LOW (ref 8.9–10.3)
Chloride: 110 mmol/L (ref 98–111)
Creatinine, Ser: 2.8 mg/dL — ABNORMAL HIGH (ref 0.44–1.00)
GFR, Estimated: 23 mL/min — ABNORMAL LOW
Glucose, Bld: 72 mg/dL (ref 70–99)
Potassium: 4.5 mmol/L (ref 3.5–5.1)
Sodium: 139 mmol/L (ref 135–145)

## 2024-02-21 NOTE — ED Provider Notes (Signed)
 "  [facility] Provider Note    Event Date/Time   First MD Initiated Contact with Patient 02/21/24 1622     (approximate)   History   Blurred Vision   HPI  Mandy Patel is a 29 y.o. female with a past medical history of type 1 diabetes, hypertension, presenting to the emergency department complaining of left eye blurriness which began at around lunchtime yesterday.  The patient reports that occasionally her vision will clear out for a minute then becomes blurry again.  She denies any headaches, drainage, pain.  She also denies any visual field loss, slurred speech, facial droop, one-sided weakness, numbness, or tingling.     Physical Exam   Triage Vital Signs: ED Triage Vitals [02/21/24 1305]  Encounter Vitals Group     BP 126/83     Girls Systolic BP Percentile      Girls Diastolic BP Percentile      Boys Systolic BP Percentile      Boys Diastolic BP Percentile      Pulse Rate 97     Resp 18     Temp 98.7 F (37.1 C)     Temp src      SpO2 100 %     Weight 170 lb (77.1 kg)     Height 5' 8 (1.727 m)     Head Circumference      Peak Flow      Pain Score 0     Pain Loc      Pain Education      Exclude from Growth Chart     Most recent vital signs: Vitals:   02/21/24 1305  BP: 126/83  Pulse: 97  Resp: 18  Temp: 98.7 F (37.1 C)  SpO2: 100%     General: Awake, no distress.  CV:  Good peripheral perfusion.  Resp:  Normal effort.  Abd:  No distention.  Other:  Extraocular movements intact, pupils equal round and reactive to light.   ED Results / Procedures / Treatments   Labs (all labs ordered are listed, but only abnormal results are displayed) Labs Reviewed  CBC - Abnormal; Notable for the following components:      Result Value   WBC 14.9 (*)    RBC 2.76 (*)    Hemoglobin 8.8 (*)    HCT 27.3 (*)    All other components within normal limits  BASIC METABOLIC PANEL WITH GFR - Abnormal; Notable for the following  components:   CO2 19 (*)    BUN 31 (*)    Creatinine, Ser 2.80 (*)    Calcium 8.8 (*)    GFR, Estimated 23 (*)    All other components within normal limits     EKG     RADIOLOGY     PROCEDURES:  Critical Care performed: No  Procedures   MEDICATIONS ORDERED IN ED: Medications - No data to display   IMPRESSION / MDM / ASSESSMENT AND PLAN / ED COURSE  I reviewed the triage vital signs and the nursing notes.                              Differential diagnosis includes, but is not limited to, macular degeneration, conjunctivitis, retinal detachment, vitreal detachment, CVA  Patient's presentation is most consistent with exacerbation of chronic illness.  Patient is a 29 year old female with a past medical history of type 1 diabetes, hypertension, presenting to the  emergency department complaining of left eye blurriness with began at lunchtime yesterday.  Lab work was performed and overall appears to be at the patient's baseline.  Patient does not have any neurological deficits on exam.  We attempted to perform a visual acuity however the patient reported that she was only able to see the outline of the chart with her right eye and was unable to see the chart at all with her left eye.  On my exam she was able to look directly at me and was able to follow my finger without any difficulty.  She is also texting on her phone.  I discussed the patient's case with Dr. [place], ophthalmology.  After discussion of the patient's case he asked that the patient come to his office and [place] tomorrow morning at 8 AM for evaluation.  I updated the patient on plan for follow-up and she is agreeable.  She will be provided with a work note for today and tomorrow.  She will return immediately to the emergency department for any new or worsening symptoms.      FINAL CLINICAL IMPRESSION(S) / ED DIAGNOSES   Final diagnoses:  Blurry vision, left eye     Rx / DC Orders   ED Discharge  Orders     None        Note:  This document was prepared using Dragon voice recognition software and may include unintentional dictation errors.   Rexford Reche HERO, MD 02/21/24 1727  "

## 2024-02-21 NOTE — ED Triage Notes (Signed)
 Pt to ED for left eye blurriness started at lunch time yesterday. Denies h/a, drainage, pain. +itchiness. Ambulatory steady gait. Pt is diabetic. Verbal orders Dr Rosalie

## 2024-02-24 ENCOUNTER — Other Ambulatory Visit: Payer: Self-pay | Admitting: Nurse Practitioner

## 2024-02-24 DIAGNOSIS — E1065 Type 1 diabetes mellitus with hyperglycemia: Secondary | ICD-10-CM

## 2024-02-24 NOTE — Telephone Encounter (Signed)
 Copied from CRM #[id number]. Topic: Clinical - Medication Refill >> Feb 24, 2024 11:00 AM Tysheama G wrote: Medication:   insulin  glargine (LANTUS  SOLOSTAR) 100 UNIT/ML Solostar Pen insulin  lispro (HUMALOG ) 100 UNIT/ML KwikPen   Has the patient contacted their pharmacy? No (Agent: If no, request that the patient contact the pharmacy for the refill. If patient does not wish to contact the pharmacy document the reason why and proceed with request.) (Agent: If yes, when and what did the pharmacy advise?)  This is the patient's preferred pharmacy:  [facility] [address] [place] - [address] [place] [address] Phone: [phone] Fax: [phone]   Is this the correct pharmacy for this prescription? Yes If no, delete pharmacy and type the correct one.   Has the prescription been filled recently? No  Is the patient out of the medication? Yes  Has the patient been seen for an appointment in the last year OR does the patient have an upcoming appointment? Yes  Can we respond through MyChart? Yes  Agent: Please be advised that Rx refills may take up to 3 business days. We ask that you follow-up with your pharmacy.

## 2024-04-05 ENCOUNTER — Ambulatory Visit: Admitting: Nurse Practitioner
